# Patient Record
Sex: Male | Born: 1940 | Race: White | Hispanic: No | Smoking: Former smoker
Health system: Southern US, Community
[De-identification: ages and names within clinical notes are randomized; demographics above are authoritative.]

## PROBLEM LIST (undated history)

## (undated) DIAGNOSIS — E119 Type 2 diabetes mellitus without complications: Secondary | ICD-10-CM

## (undated) DIAGNOSIS — I10 Essential (primary) hypertension: Secondary | ICD-10-CM

## (undated) DIAGNOSIS — I2510 Atherosclerotic heart disease of native coronary artery without angina pectoris: Secondary | ICD-10-CM

## (undated) DIAGNOSIS — E7800 Pure hypercholesterolemia, unspecified: Secondary | ICD-10-CM

## (undated) DIAGNOSIS — R001 Bradycardia, unspecified: Secondary | ICD-10-CM

## (undated) DIAGNOSIS — C61 Malignant neoplasm of prostate: Secondary | ICD-10-CM

## (undated) DIAGNOSIS — C641 Malignant neoplasm of right kidney, except renal pelvis: Secondary | ICD-10-CM

## (undated) DIAGNOSIS — I499 Cardiac arrhythmia, unspecified: Secondary | ICD-10-CM

## (undated) DIAGNOSIS — I498 Other specified cardiac arrhythmias: Secondary | ICD-10-CM

## (undated) DIAGNOSIS — R0600 Dyspnea, unspecified: Secondary | ICD-10-CM

## (undated) DIAGNOSIS — R0989 Other specified symptoms and signs involving the circulatory and respiratory systems: Secondary | ICD-10-CM

## (undated) DIAGNOSIS — Z7189 Other specified counseling: Secondary | ICD-10-CM

## (undated) DIAGNOSIS — I739 Peripheral vascular disease, unspecified: Secondary | ICD-10-CM

## (undated) DIAGNOSIS — R809 Proteinuria, unspecified: Secondary | ICD-10-CM

## (undated) DIAGNOSIS — Z8546 Personal history of malignant neoplasm of prostate: Secondary | ICD-10-CM

## (undated) HISTORY — DX: Other specified symptoms and signs involving the circulatory and respiratory systems: R09.89

## (undated) HISTORY — DX: Bradycardia, unspecified: R00.1

## (undated) HISTORY — DX: Dyspnea, unspecified: R06.00

## (undated) HISTORY — DX: Proteinuria, unspecified: R80.9

## (undated) HISTORY — DX: Type 2 diabetes mellitus without complications: E11.9

## (undated) HISTORY — DX: Essential (primary) hypertension: I10

## (undated) HISTORY — DX: Peripheral vascular disease, unspecified: I73.9

## (undated) HISTORY — DX: Personal history of malignant neoplasm of prostate: Z85.46

## (undated) HISTORY — DX: Atherosclerotic heart disease of native coronary artery without angina pectoris: I25.10

## (undated) HISTORY — PX: CORONARY ANGIOPLASTY WITH STENT PLACEMENT: SHX49

## (undated) HISTORY — DX: Malignant neoplasm of prostate: C61

## (undated) HISTORY — DX: Malignant neoplasm of right kidney, except renal pelvis: C64.1

## (undated) HISTORY — DX: Other specified counseling: Z71.89

## (undated) HISTORY — DX: Cardiac arrhythmia, unspecified: I49.9

## (undated) HISTORY — DX: Other specified cardiac arrhythmias: I49.8

## (undated) HISTORY — DX: Pure hypercholesterolemia, unspecified: E78.00

---

## 1998-02-05 ENCOUNTER — Ambulatory Visit (HOSPITAL_BASED_OUTPATIENT_CLINIC_OR_DEPARTMENT_OTHER): Admission: RE | Admit: 1998-02-05 | Discharge: 1998-02-05 | Payer: Self-pay | Admitting: Orthopedic Surgery

## 1998-02-26 ENCOUNTER — Ambulatory Visit (HOSPITAL_BASED_OUTPATIENT_CLINIC_OR_DEPARTMENT_OTHER): Admission: RE | Admit: 1998-02-26 | Discharge: 1998-02-26 | Payer: Self-pay | Admitting: Orthopedic Surgery

## 2002-06-12 ENCOUNTER — Encounter (INDEPENDENT_AMBULATORY_CARE_PROVIDER_SITE_OTHER): Payer: Self-pay

## 2002-06-12 ENCOUNTER — Ambulatory Visit (HOSPITAL_COMMUNITY): Admission: RE | Admit: 2002-06-12 | Discharge: 2002-06-12 | Payer: Self-pay | Admitting: *Deleted

## 2003-04-06 ENCOUNTER — Ambulatory Visit (HOSPITAL_COMMUNITY): Admission: RE | Admit: 2003-04-06 | Discharge: 2003-04-06 | Payer: Self-pay | Admitting: Urology

## 2003-04-06 ENCOUNTER — Encounter: Payer: Self-pay | Admitting: Urology

## 2003-05-09 ENCOUNTER — Ambulatory Visit: Admission: RE | Admit: 2003-05-09 | Discharge: 2003-08-07 | Payer: Self-pay | Admitting: *Deleted

## 2003-10-01 ENCOUNTER — Ambulatory Visit: Admission: RE | Admit: 2003-10-01 | Discharge: 2003-12-30 | Payer: Self-pay | Admitting: Radiation Oncology

## 2003-11-02 ENCOUNTER — Encounter: Admission: RE | Admit: 2003-11-02 | Discharge: 2003-11-02 | Payer: Self-pay | Admitting: Urology

## 2003-11-09 ENCOUNTER — Ambulatory Visit (HOSPITAL_BASED_OUTPATIENT_CLINIC_OR_DEPARTMENT_OTHER): Admission: RE | Admit: 2003-11-09 | Discharge: 2003-11-09 | Payer: Self-pay | Admitting: Urology

## 2003-11-09 ENCOUNTER — Ambulatory Visit (HOSPITAL_COMMUNITY): Admission: RE | Admit: 2003-11-09 | Discharge: 2003-11-09 | Payer: Self-pay | Admitting: Urology

## 2004-02-27 ENCOUNTER — Inpatient Hospital Stay (HOSPITAL_COMMUNITY): Admission: EM | Admit: 2004-02-27 | Discharge: 2004-02-29 | Payer: Self-pay

## 2004-04-05 ENCOUNTER — Emergency Department (HOSPITAL_COMMUNITY): Admission: EM | Admit: 2004-04-05 | Discharge: 2004-04-06 | Payer: Self-pay | Admitting: Emergency Medicine

## 2004-08-22 ENCOUNTER — Ambulatory Visit: Payer: Self-pay | Admitting: Cardiology

## 2004-11-24 ENCOUNTER — Ambulatory Visit: Payer: Self-pay | Admitting: Cardiology

## 2005-02-23 ENCOUNTER — Ambulatory Visit: Payer: Self-pay | Admitting: Cardiology

## 2005-05-15 ENCOUNTER — Ambulatory Visit: Payer: Self-pay | Admitting: Internal Medicine

## 2005-06-05 ENCOUNTER — Ambulatory Visit: Payer: Self-pay | Admitting: Cardiovascular Disease

## 2005-06-05 ENCOUNTER — Ambulatory Visit: Payer: Self-pay

## 2005-06-29 ENCOUNTER — Ambulatory Visit: Payer: Self-pay

## 2005-07-10 ENCOUNTER — Ambulatory Visit: Payer: Self-pay | Admitting: Cardiovascular Disease

## 2005-07-13 ENCOUNTER — Ambulatory Visit: Payer: Self-pay | Admitting: Cardiovascular Disease

## 2005-07-13 ENCOUNTER — Inpatient Hospital Stay (HOSPITAL_BASED_OUTPATIENT_CLINIC_OR_DEPARTMENT_OTHER): Admission: RE | Admit: 2005-07-13 | Discharge: 2005-07-13 | Payer: Self-pay | Admitting: Cardiovascular Disease

## 2005-07-16 ENCOUNTER — Encounter (HOSPITAL_COMMUNITY): Admission: RE | Admit: 2005-07-16 | Discharge: 2005-10-14 | Payer: Self-pay | Admitting: Cardiovascular Disease

## 2005-08-04 ENCOUNTER — Ambulatory Visit: Payer: Self-pay | Admitting: Internal Medicine

## 2005-08-31 ENCOUNTER — Ambulatory Visit: Payer: Self-pay | Admitting: Cardiovascular Disease

## 2005-10-15 ENCOUNTER — Encounter (HOSPITAL_COMMUNITY): Admission: RE | Admit: 2005-10-15 | Discharge: 2005-12-17 | Payer: Self-pay | Admitting: Cardiovascular Disease

## 2005-10-23 ENCOUNTER — Encounter: Payer: Self-pay | Admitting: Family Medicine

## 2005-10-23 LAB — CONVERTED CEMR LAB: Hgb A1c MFr Bld: 5.8 %

## 2006-04-19 ENCOUNTER — Ambulatory Visit: Payer: Self-pay | Admitting: Cardiovascular Disease

## 2006-04-19 ENCOUNTER — Inpatient Hospital Stay (HOSPITAL_COMMUNITY): Admission: EM | Admit: 2006-04-19 | Discharge: 2006-04-21 | Payer: Self-pay | Admitting: Emergency Medicine

## 2006-04-19 ENCOUNTER — Encounter: Payer: Self-pay | Admitting: Family Medicine

## 2006-04-19 LAB — CONVERTED CEMR LAB: PSA: 0.2 ng/mL

## 2006-05-21 ENCOUNTER — Ambulatory Visit: Payer: Self-pay | Admitting: Cardiovascular Disease

## 2006-06-14 ENCOUNTER — Ambulatory Visit: Payer: Self-pay | Admitting: Cardiovascular Disease

## 2006-12-03 ENCOUNTER — Ambulatory Visit: Payer: Self-pay | Admitting: Cardiovascular Disease

## 2006-12-24 ENCOUNTER — Ambulatory Visit: Payer: Self-pay

## 2007-01-03 ENCOUNTER — Ambulatory Visit: Payer: Self-pay

## 2007-01-03 ENCOUNTER — Encounter: Payer: Self-pay | Admitting: Cardiology

## 2007-04-15 ENCOUNTER — Emergency Department (HOSPITAL_COMMUNITY): Admission: EM | Admit: 2007-04-15 | Discharge: 2007-04-15 | Payer: Self-pay | Admitting: Emergency Medicine

## 2007-04-15 ENCOUNTER — Ambulatory Visit: Payer: Self-pay | Admitting: Cardiology

## 2007-04-19 ENCOUNTER — Ambulatory Visit: Payer: Self-pay | Admitting: Cardiovascular Disease

## 2007-05-23 ENCOUNTER — Encounter: Payer: Self-pay | Admitting: Family Medicine

## 2007-05-23 LAB — CONVERTED CEMR LAB: Microalb, Ur: 70.4 mg/dL

## 2007-07-15 ENCOUNTER — Ambulatory Visit: Payer: Self-pay | Admitting: Cardiovascular Disease

## 2007-11-28 ENCOUNTER — Ambulatory Visit: Payer: Self-pay | Admitting: Family Medicine

## 2007-11-28 DIAGNOSIS — E119 Type 2 diabetes mellitus without complications: Secondary | ICD-10-CM | POA: Insufficient documentation

## 2007-11-28 DIAGNOSIS — I2510 Atherosclerotic heart disease of native coronary artery without angina pectoris: Secondary | ICD-10-CM

## 2007-11-28 DIAGNOSIS — R001 Bradycardia, unspecified: Secondary | ICD-10-CM | POA: Insufficient documentation

## 2007-12-02 ENCOUNTER — Encounter: Payer: Self-pay | Admitting: Family Medicine

## 2007-12-02 LAB — CONVERTED CEMR LAB
Alkaline Phosphatase: 72 units/L (ref 39–117)
CO2: 21 meq/L (ref 19–32)
Cholesterol: 120 mg/dL (ref 0–200)
Creatinine, Ser: 1.06 mg/dL (ref 0.40–1.50)
Glucose, Bld: 127 mg/dL — ABNORMAL HIGH (ref 70–99)
HCT: 46.4 % (ref 39.0–52.0)
HDL: 37 mg/dL — ABNORMAL LOW (ref >39)
MCHC: 30.6 g/dL (ref 30.0–36.0)
MCV: 93.2 fL (ref 78.0–100.0)
Platelets: 172 10*3/uL (ref 150–400)
RBC: 4.98 M/uL (ref 4.22–5.81)
Total Bilirubin: 0.5 mg/dL (ref 0.3–1.2)
Total CHOL/HDL Ratio: 3.2
Triglycerides: 133 mg/dL (ref <150)
VLDL: 27 mg/dL (ref 0–40)
WBC: 8.7 10*3/uL (ref 4.0–10.5)

## 2007-12-05 ENCOUNTER — Ambulatory Visit: Payer: Self-pay | Admitting: Family Medicine

## 2008-01-04 ENCOUNTER — Encounter: Payer: Self-pay | Admitting: Family Medicine

## 2008-01-06 ENCOUNTER — Ambulatory Visit: Payer: Self-pay | Admitting: Cardiovascular Disease

## 2008-01-06 ENCOUNTER — Ambulatory Visit: Payer: Self-pay

## 2008-01-19 ENCOUNTER — Encounter: Payer: Self-pay | Admitting: Family Medicine

## 2008-01-19 DIAGNOSIS — Z8546 Personal history of malignant neoplasm of prostate: Secondary | ICD-10-CM

## 2008-01-20 ENCOUNTER — Encounter: Payer: Self-pay | Admitting: Family Medicine

## 2008-02-06 ENCOUNTER — Ambulatory Visit: Payer: Self-pay | Admitting: Family Medicine

## 2008-06-18 ENCOUNTER — Telehealth: Payer: Self-pay | Admitting: Family Medicine

## 2008-06-28 ENCOUNTER — Ambulatory Visit: Payer: Self-pay | Admitting: Family Medicine

## 2008-06-28 DIAGNOSIS — J309 Allergic rhinitis, unspecified: Secondary | ICD-10-CM | POA: Insufficient documentation

## 2008-06-28 LAB — CONVERTED CEMR LAB: Hgb A1c MFr Bld: 6.2 %

## 2008-07-03 LAB — CONVERTED CEMR LAB: PSA: 0.12 ng/mL (ref 0.10–4.00)

## 2008-07-06 ENCOUNTER — Ambulatory Visit: Payer: Self-pay | Admitting: Cardiovascular Disease

## 2008-09-03 ENCOUNTER — Ambulatory Visit: Payer: Self-pay | Admitting: Family Medicine

## 2008-10-01 ENCOUNTER — Ambulatory Visit: Payer: Self-pay | Admitting: Family Medicine

## 2008-12-07 ENCOUNTER — Ambulatory Visit: Payer: Self-pay | Admitting: Family Medicine

## 2008-12-07 LAB — CONVERTED CEMR LAB

## 2008-12-31 ENCOUNTER — Telehealth: Payer: Self-pay | Admitting: Family Medicine

## 2008-12-31 ENCOUNTER — Encounter: Payer: Self-pay | Admitting: Family Medicine

## 2008-12-31 LAB — CONVERTED CEMR LAB
AST: 25 units/L (ref 0–37)
Albumin: 4.3 g/dL (ref 3.5–5.2)
BUN: 23 mg/dL (ref 6–23)
CO2: 21 meq/L (ref 19–32)
Calcium: 9.2 mg/dL (ref 8.4–10.5)
Chloride: 102 meq/L (ref 96–112)
Glucose, Bld: 140 mg/dL — ABNORMAL HIGH (ref 70–99)
HDL: 33 mg/dL — ABNORMAL LOW (ref >39)
Potassium: 4.7 meq/L (ref 3.5–5.3)

## 2009-01-02 LAB — CONVERTED CEMR LAB: IgE (Immunoglobulin E), Serum: 165.1 intl units/mL (ref 0.0–180.0)

## 2009-01-07 ENCOUNTER — Encounter: Payer: Self-pay | Admitting: Cardiovascular Disease

## 2009-01-07 ENCOUNTER — Ambulatory Visit: Payer: Self-pay | Admitting: Cardiovascular Disease

## 2009-01-07 ENCOUNTER — Ambulatory Visit: Payer: Self-pay

## 2009-01-07 DIAGNOSIS — I739 Peripheral vascular disease, unspecified: Secondary | ICD-10-CM

## 2009-01-07 DIAGNOSIS — J449 Chronic obstructive pulmonary disease, unspecified: Secondary | ICD-10-CM

## 2009-01-17 ENCOUNTER — Telehealth (INDEPENDENT_AMBULATORY_CARE_PROVIDER_SITE_OTHER): Payer: Self-pay | Admitting: *Deleted

## 2009-01-18 DIAGNOSIS — I10 Essential (primary) hypertension: Secondary | ICD-10-CM | POA: Insufficient documentation

## 2009-01-18 DIAGNOSIS — L821 Other seborrheic keratosis: Secondary | ICD-10-CM

## 2009-01-18 DIAGNOSIS — R0989 Other specified symptoms and signs involving the circulatory and respiratory systems: Secondary | ICD-10-CM

## 2009-01-18 DIAGNOSIS — E7800 Pure hypercholesterolemia, unspecified: Secondary | ICD-10-CM | POA: Insufficient documentation

## 2009-01-21 ENCOUNTER — Ambulatory Visit: Payer: Self-pay

## 2009-01-21 ENCOUNTER — Encounter: Payer: Self-pay | Admitting: Cardiovascular Disease

## 2009-01-23 ENCOUNTER — Encounter: Payer: Self-pay | Admitting: Cardiovascular Disease

## 2009-02-11 ENCOUNTER — Telehealth: Payer: Self-pay | Admitting: Cardiovascular Disease

## 2009-02-14 ENCOUNTER — Telehealth: Payer: Self-pay | Admitting: Cardiovascular Disease

## 2009-02-21 ENCOUNTER — Telehealth: Payer: Self-pay | Admitting: Cardiovascular Disease

## 2009-03-04 ENCOUNTER — Ambulatory Visit: Payer: Self-pay | Admitting: Family Medicine

## 2009-03-04 DIAGNOSIS — Q181 Preauricular sinus and cyst: Secondary | ICD-10-CM

## 2009-03-04 DIAGNOSIS — M75101 Unspecified rotator cuff tear or rupture of right shoulder, not specified as traumatic: Secondary | ICD-10-CM | POA: Insufficient documentation

## 2009-03-08 ENCOUNTER — Telehealth: Payer: Self-pay | Admitting: Family Medicine

## 2009-03-15 ENCOUNTER — Encounter: Payer: Self-pay | Admitting: Family Medicine

## 2009-04-08 ENCOUNTER — Ambulatory Visit: Payer: Self-pay | Admitting: Cardiovascular Disease

## 2009-04-08 DIAGNOSIS — I70219 Atherosclerosis of native arteries of extremities with intermittent claudication, unspecified extremity: Secondary | ICD-10-CM

## 2009-05-30 ENCOUNTER — Encounter (INDEPENDENT_AMBULATORY_CARE_PROVIDER_SITE_OTHER): Payer: Self-pay | Admitting: *Deleted

## 2009-07-05 ENCOUNTER — Telehealth: Payer: Self-pay | Admitting: Cardiovascular Disease

## 2009-07-08 ENCOUNTER — Ambulatory Visit: Payer: Self-pay | Admitting: Family Medicine

## 2009-07-18 ENCOUNTER — Encounter: Payer: Self-pay | Admitting: Cardiovascular Disease

## 2009-07-19 ENCOUNTER — Ambulatory Visit: Payer: Self-pay

## 2009-07-19 ENCOUNTER — Ambulatory Visit: Payer: Self-pay | Admitting: Cardiovascular Disease

## 2009-07-24 ENCOUNTER — Encounter: Payer: Self-pay | Admitting: Cardiovascular Disease

## 2009-08-07 ENCOUNTER — Ambulatory Visit: Payer: Self-pay | Admitting: Family Medicine

## 2009-08-26 ENCOUNTER — Ambulatory Visit: Payer: Self-pay | Admitting: Family Medicine

## 2009-08-26 DIAGNOSIS — K3189 Other diseases of stomach and duodenum: Secondary | ICD-10-CM

## 2009-08-26 DIAGNOSIS — R1013 Epigastric pain: Secondary | ICD-10-CM

## 2009-09-06 ENCOUNTER — Ambulatory Visit: Payer: Self-pay | Admitting: Family Medicine

## 2009-09-06 DIAGNOSIS — IMO0002 Reserved for concepts with insufficient information to code with codable children: Secondary | ICD-10-CM

## 2009-09-18 ENCOUNTER — Encounter: Payer: Self-pay | Admitting: Family Medicine

## 2009-10-07 ENCOUNTER — Ambulatory Visit: Payer: Self-pay | Admitting: Cardiovascular Disease

## 2009-10-25 ENCOUNTER — Ambulatory Visit: Payer: Self-pay | Admitting: Family Medicine

## 2009-10-25 ENCOUNTER — Telehealth: Payer: Self-pay | Admitting: Family Medicine

## 2009-10-25 DIAGNOSIS — K644 Residual hemorrhoidal skin tags: Secondary | ICD-10-CM | POA: Insufficient documentation

## 2009-10-25 DIAGNOSIS — D239 Other benign neoplasm of skin, unspecified: Secondary | ICD-10-CM | POA: Insufficient documentation

## 2009-10-28 ENCOUNTER — Encounter: Payer: Self-pay | Admitting: Family Medicine

## 2009-10-29 LAB — CONVERTED CEMR LAB
AST: 17 units/L (ref 0–37)
BUN: 26 mg/dL — ABNORMAL HIGH (ref 6–23)
CO2: 25 meq/L (ref 19–32)
Calcium: 9.9 mg/dL (ref 8.4–10.5)
Chloride: 102 meq/L (ref 96–112)
Cholesterol: 101 mg/dL (ref 0–200)
Creatinine, Ser: 0.95 mg/dL (ref 0.40–1.50)
HCT: 41 % (ref 39.0–52.0)
HDL: 35 mg/dL — ABNORMAL LOW (ref >39)
Hemoglobin: 12.8 g/dL — ABNORMAL LOW (ref 13.0–17.0)
RDW: 13.8 % (ref 11.5–15.5)
Total CHOL/HDL Ratio: 2.9
Triglycerides: 70 mg/dL (ref <150)

## 2009-11-05 ENCOUNTER — Encounter: Payer: Self-pay | Admitting: Family Medicine

## 2009-11-06 ENCOUNTER — Telehealth: Payer: Self-pay | Admitting: Cardiovascular Disease

## 2009-11-11 ENCOUNTER — Ambulatory Visit (HOSPITAL_COMMUNITY): Admission: RE | Admit: 2009-11-11 | Discharge: 2009-11-11 | Payer: Self-pay | Admitting: Family Medicine

## 2009-11-13 ENCOUNTER — Encounter: Payer: Self-pay | Admitting: Family Medicine

## 2009-11-14 ENCOUNTER — Ambulatory Visit: Payer: Self-pay | Admitting: Family Medicine

## 2009-11-18 ENCOUNTER — Telehealth: Payer: Self-pay | Admitting: Family Medicine

## 2009-11-19 ENCOUNTER — Ambulatory Visit: Payer: Self-pay | Admitting: Oncology

## 2009-11-21 ENCOUNTER — Encounter: Payer: Self-pay | Admitting: Family Medicine

## 2009-11-25 ENCOUNTER — Encounter: Payer: Self-pay | Admitting: Family Medicine

## 2009-11-25 LAB — CBC WITH DIFFERENTIAL/PLATELET
Basophils Absolute: 0 10*3/uL (ref 0.0–0.1)
HCT: 38.9 % (ref 38.4–49.9)
HGB: 13 g/dL (ref 13.0–17.1)
MCH: 29.7 pg (ref 27.2–33.4)
MONO#: 0.5 10*3/uL (ref 0.1–0.9)
NEUT%: 64.9 % (ref 39.0–75.0)
WBC: 6.8 10*3/uL (ref 4.0–10.3)
lymph#: 1.8 10*3/uL (ref 0.9–3.3)

## 2009-11-25 LAB — COMPREHENSIVE METABOLIC PANEL
BUN: 25 mg/dL — ABNORMAL HIGH (ref 6–23)
CO2: 30 mEq/L (ref 19–32)
Calcium: 9.9 mg/dL (ref 8.4–10.5)
Chloride: 104 mEq/L (ref 96–112)
Creatinine, Ser: 1.18 mg/dL (ref 0.40–1.50)

## 2009-11-25 LAB — LACTATE DEHYDROGENASE: LDH: 93 U/L — ABNORMAL LOW (ref 94–250)

## 2009-12-04 ENCOUNTER — Encounter (HOSPITAL_COMMUNITY): Admission: RE | Admit: 2009-12-04 | Discharge: 2010-03-04 | Payer: Self-pay | Admitting: Urology

## 2009-12-14 ENCOUNTER — Encounter: Payer: Self-pay | Admitting: Family Medicine

## 2009-12-23 ENCOUNTER — Telehealth: Payer: Self-pay | Admitting: Cardiovascular Disease

## 2009-12-25 ENCOUNTER — Ambulatory Visit: Payer: Self-pay | Admitting: Oncology

## 2009-12-27 ENCOUNTER — Encounter: Payer: Self-pay | Admitting: Family Medicine

## 2010-01-28 ENCOUNTER — Ambulatory Visit: Payer: Self-pay | Admitting: Family Medicine

## 2010-01-28 DIAGNOSIS — J209 Acute bronchitis, unspecified: Secondary | ICD-10-CM

## 2010-04-18 ENCOUNTER — Encounter: Payer: Self-pay | Admitting: Cardiovascular Disease

## 2010-04-21 ENCOUNTER — Ambulatory Visit: Payer: Self-pay

## 2010-04-21 ENCOUNTER — Ambulatory Visit: Payer: Self-pay | Admitting: Cardiovascular Disease

## 2010-04-26 ENCOUNTER — Emergency Department (HOSPITAL_COMMUNITY): Admission: EM | Admit: 2010-04-26 | Discharge: 2010-04-26 | Payer: Self-pay | Admitting: Emergency Medicine

## 2010-05-23 ENCOUNTER — Ambulatory Visit: Payer: Self-pay | Admitting: Family Medicine

## 2010-08-25 ENCOUNTER — Ambulatory Visit: Payer: Self-pay | Admitting: Family Medicine

## 2010-08-25 DIAGNOSIS — S8010XA Contusion of unspecified lower leg, initial encounter: Secondary | ICD-10-CM | POA: Insufficient documentation

## 2010-08-25 DIAGNOSIS — L723 Sebaceous cyst: Secondary | ICD-10-CM | POA: Insufficient documentation

## 2010-09-01 ENCOUNTER — Telehealth: Payer: Self-pay | Admitting: Family Medicine

## 2010-10-23 ENCOUNTER — Encounter: Payer: Self-pay | Admitting: Cardiovascular Disease

## 2010-10-23 NOTE — Progress Notes (Signed)
Summary: pt needs letter of clearence  Phone Note From Other Clinic Call back at [phone]   Caller: Velna Hatchet with Dr. Yevonne Pax Summary of Call: pt having colonoscopy on 2/23 and needs to stop plavix for 4days please fax letter of clearence to [phone] Initial call taken by: Lorne Skeens,  November 06, 2009 2:44 PM  Follow-up for Phone Call        Spoke with Velna Hatchet at Dr. Yevonne Pax' office regarding pt. needing to be off Plavix  for 4 days starting on Saturday 02/19 /11. Pt. is scheduled  for a Colonoscopy on 11/13/09.  I let Velna Hatchet know will send message to MD and his nurse for recommendations. Ollen Gross, RN, BSN  November 06, 2009 4:01 PM   Additional Follow-up for Phone Call Additional follow up Details #1::        ok to stop Plavix and have colonoscopy Additional Follow-up by: Colon Branch, MD, [facility],  November 07, 2009 8:41 AM

## 2010-10-23 NOTE — Letter (Signed)
[facility]   Imported By: Lanelle Bal 01/14/2010 14:24:48  _____________________________________________________________________  External Attachment:    Type:   Image     Comment:   External Document

## 2010-10-23 NOTE — Consult Note (Signed)
Summary: [facility]  [facility]   Imported By: Lanelle Bal 12/26/2009 11:27:48  _____________________________________________________________________  External Attachment:    Type:   Image     Comment:   External Document

## 2010-10-23 NOTE — Letter (Signed)
[facility]   Imported By: Lanelle Bal 12/11/2009 08:50:00  _____________________________________________________________________  External Attachment:    Type:   Image     Comment:   External Document

## 2010-10-23 NOTE — Progress Notes (Signed)
Summary: Call a Nurse  Phone Note Call from Patient   Caller: Call a Nurse Summary of Call: [facility] Triage Call Report Triage Record Num: [id number] Operator: Bethanie Dicker Patient Name: [name] [name] Call Date & Time: 11/17/2009 6:06:07PM Patient Phone: [phone] PCP: Seymour Bars, DO Patient Gender: Male PCP Fax : [phone] Patient DOB: 12-Oct-1940 Practice Name: [facility] Reason for Call: Calling stating that he would like to get a message to Dr. Cathey Endow. States that he is supposed to be scheduled for a "bone scan" sometime this week and he is going to be out of town on Tuesday and Wednesday but he can do any other time this week. Advsied that I would take this message and fax to office but to also f/u tomorrow with this mesaage during daytime hours. Protocol(s) Used: Office Note Recommended Outcome per Protocol: Information Noted and Sent to Office Reason for Outcome: Caller information to office Care Advice:  ~ 02/  Follow-up for Phone Call        Pls call pt back to ask about this.  He already had a bone scan done and is supposed to have f/u appts with urology and oncology.   Follow-up by: Seymour Bars DO,  November 18, 2009 8:24 AM  Additional Follow-up for Phone Call Additional follow up Details #1::        Pt was referring to any apts for this week.  Additional Follow-up by: Payton Spark CMA,  November 18, 2009 9:10 AM    Additional Follow-up for Phone Call Additional follow up Details #2::    Pls check with [facility] and [facility] to see if appts have been made.  Myriam Jacobson may be able to check on this. Follow-up by: Seymour Bars DO,  November 18, 2009 9:22 AM   Appended Document: Call a Nurse Pt scheduled at [facility] 11/21/09 @ 8:30am. Pt aware.  Called Cancer Center- MD to review notes and they will call Pt to schedule

## 2010-10-23 NOTE — Assessment & Plan Note (Signed)
Summary: dyspepsia   Vital Signs:  Patient profile:   70 year old male Height:      70 inches Weight:      192 pounds BMI:     27.65 O2 Sat:      97 % on Room air Pulse rate:   70 / minute BP sitting:   119 / 60  (left arm) Cuff size:   regular  Vitals Entered By: Payton Spark CMA (May 23, 2010 10:11 AM)  O2 Flow:  Room air CC: Acid reflux. Would like Rx for acid reduction.    Primary Care Provider:  Seymour Bars DO  CC:  Acid reflux. Would like Rx for acid reduction. Marland Kitchen  History of Present Illness: 70 yo WM presents for acid reflux.  He went to the ED for indigestion and he ruled out.  He then went thru cardiac testing with Dr Eden Emms and eveyrthing came back normal  He was told to cut back on caffine.  Prevacid and Prilosec did not help.  Famotidine did help.  He avoids spicy, greasy foods.  Denies ETOH use.   Denies wt loss, epigastric pain, melena, hematochezia, Nausea or vomitting.      Current Medications (verified): 1)  Plavix 75 Mg  Tabs (Clopidogrel Bisulfate) .... Take 1 Tablet By Mouth Once A Day 2)  Metoprolol Succinate 100 Mg  Tb24 (Metoprolol Succinate) .... Take 1/2  Tablet By Mouth Once A Day 3)  Glipizide-Metformin Hcl 2.5-500 Mg  Tabs (Glipizide-Metformin Hcl) .... 2 Tabs By Mouth Bid 4)  Cvs Aspirin 325 Mg  Tabs (Aspirin) .... Take 1 Tablet By Mouth Once A Day 5)  Cardura 2 Mg  Tabs (Doxazosin Mesylate) .Marland Kitchen.. 1 Tab By Mouth Q Hs 6)  Lisinopril 40 Mg Tabs (Lisinopril) .... Take 1 Tablet By Mouth Once A Day 7)  Zocor 80 Mg Tabs (Simvastatin) .... Take 1/2  Tablet By Mouth At Bedtime 8)  Hydrochlorothiazide 25 Mg Tabs (Hydrochlorothiazide) .... Take One Tablet By Mouth Daily.  Allergies (verified): 1)  ! 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 cardiac stenting  Social History: Reviewed history from 09/06/2009 and no changes required. 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  Has 4 kids, 2 are local. Quit smoking 8-08 after 20 pack years. 1 ETOH/ wk.  Fair diet. No regular exercise.  Review of Systems      See HPI  Physical Exam  General:  alert, well-developed, well-nourished, well-hydrated, and overweight-appearing.   Head:  normocephalic and atraumatic.   Eyes:  sclera non icteric.  R sclera pterygion Mouth:  pharynx pink and moist.   Neck:  no masses.   Lungs:  Normal respiratory effort, chest expands symmetrically. Lungs are clear to auscultation, no crackles or wheezes.  + bibasilar rhonchi with cough Heart:  Normal rate and regular rhythm. S1 and S2 normal without gallop, murmur, click, rub or other extra sounds. Abdomen:  soft, non-tender, normal bowel sounds, no distention, no masses, no guarding, no hepatomegaly, and no splenomegaly.  no epigastric guarding Extremities:  no LE edema Skin:  hemosiderin deposits over both anterior tibias Psych:  good  eye contact, not anxious appearing, and not depressed appearing.     Impression & Recommendations:  Problem # 1:  DYSPEPSIA (ICD-536.8) REviewed Dr Fabio Bering notes proving that his dyspepsia was not cardiac in etiology. He has cut back on intake of caffiene but has had the most relief from H2Blockers and not PPIs. I wrote him for Famotidine 40 mg two times a day today.  he is to adhere to reflux precautions and will call if he develops abdomianl pain or blood in the stool.  He is not on any NSAIDs and avoids  ETOH.  Problem # 2:  CAD (ICD-414.00) Recently had a full w/u with Dr Eden Emms and was normal.  he has a 6 mos f/u visit. His updated medication list for this problem includes:    Plavix 75 Mg Tabs (Clopidogrel bisulfate) .Marland Kitchen... Take 1 tablet by mouth once a day    Metoprolol Succinate 100 Mg Tb24 (Metoprolol succinate) .Marland Kitchen... Take 1/2  tablet by mouth once a day    Cvs Aspirin 325 Mg Tabs (Aspirin) .Marland Kitchen... Take 1 tablet by mouth once a day    Cardura 2 Mg Tabs (Doxazosin mesylate) .Marland Kitchen... 1 tab by mouth q hs    Lisinopril 40 Mg Tabs (Lisinopril) .Marland Kitchen... Take 1 tablet by mouth once a day    Hydrochlorothiazide 25 Mg Tabs (Hydrochlorothiazide) .Marland Kitchen... Take one tablet by mouth daily.  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  Complete Medication List: 1)  Plavix 75 Mg Tabs (Clopidogrel bisulfate) .... Take 1 tablet by mouth once a day 2)  Metoprolol Succinate 100 Mg Tb24 (Metoprolol succinate) .... Take 1/2  tablet by mouth once a day 3)  Glipizide-metformin Hcl 2.5-500 Mg Tabs (Glipizide-metformin hcl) .... 2 tabs by mouth bid 4)  Cvs Aspirin 325 Mg Tabs (Aspirin) .... Take 1 tablet by mouth once a day 5)  Cardura 2 Mg Tabs (Doxazosin mesylate) .Marland Kitchen.. 1 tab by mouth q hs 6)  Lisinopril 40 Mg Tabs (Lisinopril) .... Take 1 tablet by mouth once a day 7)  Zocor 80 Mg Tabs (Simvastatin) .... Take 1/2  tablet by mouth at bedtime 8)  Hydrochlorothiazide 25 Mg Tabs (Hydrochlorothiazide) .... Take one tablet by mouth daily. 9)  Famotidine 40 Mg Tabs (Famotidine) .Marland Kitchen.. 1 tab by mouth bid  Patient Instructions: 1)  Take Famotodine 2 x a day for acid reflux. 2)  Call if any abdominal pain, blood in the stool. 3)  Return for follow up diabetes/ flu shot in 4 wks. Prescriptions: FAMOTIDINE 40 MG TABS (FAMOTIDINE) 1 tab by mouth bid  #180 x 1   Entered and Authorized by:   Seymour Bars DO   Signed by:   Seymour Bars DO on 05/23/2010   Method used:    Electronically to        [facility] [phone]* (retail)       [address] [place], [place]  [address]       Ph: [phone]       Fax: [phone]   RxID:   [id number]

## 2010-10-23 NOTE — Progress Notes (Signed)
Summary: refill  Phone Note Refill Request Message from:  Patient on December 23, 2009 1:35 PM  Refills Requested: Medication #1:  PLAVIX 75 MG  TABS Take 1 tablet by mouth once a day  Medication #2:  METOPROLOL SUCCINATE 100 MG  TB24 Take 1 tablet by mouth once a day  Medication #3:  ZOCOR 80 MG TABS Take 1/2  tablet by mouth at bedtime Pt need 90pills for each prescriptions send to [facility] [address] [phone]  Initial call taken by: Judie Grieve,  December 23, 2009 1:38 PM    Prescriptions: ZOCOR 80 MG TABS (SIMVASTATIN) Take 1/2  tablet by mouth at bedtime  #90 x 3   Entered by:   Kem Parkinson   Authorized by:   Colon Branch, MD, [facility]   Signed by:   Kem Parkinson on 12/23/2009   Method used:   Electronically to        [facility] [phone]* (retail)       [address] [place], [place]  [address]       Ph: [phone]       Fax: [phone]   RxID:   [id number] METOPROLOL SUCCINATE 100 MG  TB24 (METOPROLOL SUCCINATE) Take 1 tablet by mouth once a day  #90 x 3   Entered by:   Kem Parkinson   Authorized by:   Colon Branch, MD, [facility]   Signed by:   Kem Parkinson on 12/23/2009   Method used:   Electronically to        [facility] [phone]* (retail)       [address] [place], [place]  [address]       Ph: [phone]       Fax: [phone]   RxID:   [id number] PLAVIX 75 MG  TABS (CLOPIDOGREL BISULFATE) Take 1 tablet by mouth once a day  #90 x 3   Entered by:   Kem Parkinson   Authorized by:   Colon Branch, MD, [facility]   Signed by:   Kem Parkinson on 12/23/2009   Method used:   Electronically to        [facility] [phone]* (retail)       [address] [place], [place]  [address]       Ph: [phone]       Fax: [phone]   RxID:   [id number]

## 2010-10-23 NOTE — Miscellaneous (Signed)
Summary: Orders Update  Clinical Lists Changes  Orders: Added new Test order of Arterial Duplex Lower Extremity (Arterial Duplex Low) - Signed 

## 2010-10-23 NOTE — Consult Note (Signed)
Summary: [facility]  [facility]   Imported By: Lanelle Bal 12/02/2009 13:29:13  _____________________________________________________________________  External Attachment:    Type:   Image     Comment:   External Document

## 2010-10-23 NOTE — Assessment & Plan Note (Signed)
Summary: f/u bone scan   Vital Signs:  Patient profile:   70 year old male Height:      70 inches Weight:      193 pounds BMI:     27.79 O2 Sat:      99 % on Room air Pulse rate:   74 / minute BP sitting:   116 / 61  (left arm) Cuff size:   regular  Vitals Entered By: Payton Spark CMA (November 14, 2009 9:13 AM)  O2 Flow:  Room air CC: F/U bone scan   Primary Care Provider:  Seymour Bars DO  CC:  F/U bone scan.  History of Present Illness: 70 yo WM presents for follow up visit.  He had a bone scan done last wk after having an elevated alkaline phosphatase on screening labs.  He has a hx of prostate cancer s/p seed implants w/ Dr Patsi Sears > 5 yrs ago (per pt hx).  He has been feeling well other than some chest wall pain after a fall in the ice last month.  His pain has resolved.  He had a colonoscopy done this wk which was neg for cancer.  He had rectal tenderness though.  He cannot remember the last time he saw Dr Patsi Sears.  He has no cough or hemoptysis, night sweats, bone pain or wt loss.    Current Medications (verified): 1)  Plavix 75 Mg  Tabs (Clopidogrel Bisulfate) .... Take 1 Tablet By Mouth Once A Day 2)  Metoprolol Succinate 100 Mg  Tb24 (Metoprolol Succinate) .... Take 1 Tablet By Mouth Once A Day 3)  Glipizide-Metformin Hcl 2.5-500 Mg  Tabs (Glipizide-Metformin Hcl) .... 2 Tabs By Mouth Bid 4)  Cvs Aspirin 325 Mg  Tabs (Aspirin) .... Take 1 Tablet By Mouth Once A Day 5)  Cardura 2 Mg  Tabs (Doxazosin Mesylate) .Marland Kitchen.. 1 Tab By Mouth Q Hs 6)  Lisinopril 40 Mg Tabs (Lisinopril) .... Take 1 Tablet By Mouth Once A Day 7)  Zocor 80 Mg Tabs (Simvastatin) .... Take 1/2  Tablet By Mouth At Bedtime 8)  Hydrochlorothiazide 25 Mg Tabs (Hydrochlorothiazide) .... Take One Tablet By Mouth Daily. 9)  Percocet 5-325 Mg Tabs (Oxycodone-Acetaminophen) .Marland Kitchen.. 1 Tab By Mouth Q 8 Hrs As Needed Severe Pain  Allergies (verified): 1)  ! * Sodium Pentothal  Past History:  Past Medical  History: Reviewed history from 09/06/2009 and no changes required. 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 father died AMI at 82 mother died old age at 39 brother CAD sister healthy  Social History: Reviewed history from 09/06/2009 and no changes required. 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  Has 4 kids, 2 are local. Quit smoking 8-08 after 20 pack years. 1 ETOH/ wk.  Fair diet. No regular exercise.  Review of Systems      See HPI  Physical Exam  General:  alert, well-developed, well-nourished, and well-hydrated.   Skin:  color normal.   Psych:  good eye contact, not anxious appearing, and not depressed appearing.     Impression & Recommendations:  Problem # 1:  ALKALINE PHOSPHATASE, ELEVATED (ICD-790.5) High Alk Phos with Bone Scan highly suspicious for metastatic cancer. Hx of prostate cancer--  send bone scan to Dr Patsi Sears and I will call his to discuss f/u. He also needs to be seen by Oncology at [facility] -- he wants to see the same oncologist that his wife saw. Emotionally, he seems to be doing well and has his son for support. He denies any thoracic spine or chest wall pain at this time. Reviewed his normal colonoscopy result. I do wonder if his rectal pain is acutally coming metastatic prostate cancer.    Complete Medication List: 1)  Plavix 75 Mg Tabs (Clopidogrel bisulfate) .... Take 1 tablet by mouth once a day 2)  Metoprolol Succinate 100 Mg Tb24 (Metoprolol succinate) .... Take  1 tablet by mouth once a day 3)  Glipizide-metformin Hcl 2.5-500 Mg Tabs (Glipizide-metformin hcl) .... 2 tabs by mouth bid 4)  Cvs Aspirin 325 Mg Tabs (Aspirin) .... Take 1 tablet by mouth once a day 5)  Cardura 2 Mg Tabs (Doxazosin mesylate) .Marland Kitchen.. 1 tab by mouth q hs 6)  Lisinopril 40 Mg Tabs (Lisinopril) .... Take 1 tablet by mouth once a day 7)  Zocor 80 Mg Tabs (Simvastatin) .... Take 1/2  tablet by mouth at bedtime 8)  Hydrochlorothiazide 25 Mg Tabs (Hydrochlorothiazide) .... Take one tablet by mouth daily. 9)  Percocet 5-325 Mg Tabs (Oxycodone-acetaminophen) .Marland Kitchen.. 1 tab by mouth q 8 hrs as needed severe pain  Appended Document: f/u bone scan I spoke to the scheduler at Dr Imelda Pillow office and they are going to get [place] in during March to see his NP.  # given to scheduler.  Seymour Bars, D.O.

## 2010-10-23 NOTE — Assessment & Plan Note (Signed)
Summary: WELCOME TO MEDICARE CPE   Vital Signs:  Patient profile:   70 year old male Height:      70 inches Weight:      193.50 pounds BMI:     27.86 Pulse rate:   55 / minute BP sitting:   127 / 67  Vitals Entered By: Kandice Hams (October 25, 2009 9:57 AM) CC: CPX, Hypertension Management  Vision Screening:Left eye w/o correction: 20 / 20 Right Eye w/o correction: 20 / 40 Both eyes w/o correction:  20/ 20         Primary Care Provider:  Seymour Bars DO  CC:  CPX and Hypertension Management.  History of Present Illness: 70 yo WM presents for Welcome to [facility] Physical.  Slid on the ice last month and hit his back and his L flank.  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  Denies trouble breathing.  Tylneol not helping.  On ASA + Plavix so cannot take NSAIDs.  PMH includes Hx of prostate cancer - followed by Dr Patsi Sears, due for f/u. T2DM- on Glipizide + metformin, GERD, PAD, CAD - followed by Dr Eden Emms, on Metoprolol, Plavix, ASA, Lisinopril.  Hx of High cholesterol - on zocor.  Hx of HTN - on Metoprolol, Lisinopril, Cardura (also for BPH), HCTZ.  Also has hx of bigemiy, carotid atherosclerosis.  Allergies include Sodium Pentothal.   Quit smoking in 08. Denies drug use.  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  His family has been very supportive.  He is not exercising.  Healthy diet.  Denies ETOH.  Fam hx : Brother died of AMI at 2.  Mother died at 25.  sister healthy. Brother has CAD.    Depression screen: 1. over the past 2 wks have you felt down, depressed or hopeless?  NO. 2.  Over the past 2 wks, have you felt little interest or pleasure in doing things?  No.  Functional Ability screen (3 questions-- all NEGATIVE.)/ Heraring eval : normal. Vision: L 20/20 R 20/25 together 20/20 uncorrected    Hypertension History:      Positive major cardiovascular risk factors  include male age 36 years old or older, diabetes, hyperlipidemia, hypertension, and family history for ischemic heart disease (males less than 31 years old).  Negative major cardiovascular risk factors include non-tobacco-user status.        Positive history for target organ damage include ASHD (either angina/prior MI/prior CABG) and peripheral vascular disease.     Allergies: 1)  ! * Sodium Pentothal  Physical Exam  General:  alert, well-developed, well-nourished, well-hydrated, and overweight-appearing.   Head:  normocephalic and atraumatic.   Eyes:  pupils equal, pupils round, and pupils reactive to light.   Ears:  EACs patent; TMs translucent and gray with good cone of light and bony landmarks.  Nose:  no nasal discharge.   Mouth:  good dentition and pharynx pink and moist.   Neck:  no masses.  faint L>R carotid bruit Lungs:  Normal respiratory effort, chest expands symmetrically. Lungs are clear to auscultation, no crackles or wheezes. Heart:  Normal rate and regular rhythm. S1 and S2 normal without gallop, murmur, click, rub or other extra sounds. Abdomen:  Bowel sounds positive,abdomen soft and non-tender without masses, organomegaly.  No AA bruits audible Rectal:  external hemorrhoids present, slightly tender hemoccult neg Prostate:  Prostate gland  firm and smooth,1+  enlargement, nodularity, tenderness, mass, asymmetry or induration. Msk:  gait normal Pulses:  2+ radial and pedal pulses Extremities:  no E/C/C Skin:  color normal.  1x1 cm dark irregular nevus R mid chest Cervical Nodes:  No lymphadenopathy noted Psych:  good eye contact, not anxious appearing, and not depressed appearing.     Impression & Recommendations:  Problem # 1:  HEALTH SCREENING (ICD-V70.0)  Welcome to Medicare Physical done.   BP at goal.  BMI overwt. Compliant with meds, diet.  Could use more exercise.  Quit smoking 3 yrs ago. Mood stable after recent loss of wife to lung cancer. Pneumovax  updated 09. Derm appt scheduled for atypical nevus on chest. Update fasting labs today. Pt requested gen surg referral for symptomatic hemorrhoids w/o complication. Colonscopy due -- will set up for 10 yr f/u. DRE + PSA today with hx of prostate cancer.  Sent results to Dr Patsi Sears and schedule his f/u appt. EKG/ cardiac w/u done by Dr Eden Emms.  RTC for DM in 3 mos.  Orders: Vision Screening ([address])  Complete Medication List: 1)  Plavix 75 Mg Tabs (Clopidogrel bisulfate) .... Take 1 tablet by mouth once a day 2)  Metoprolol Succinate 100 Mg Tb24 (Metoprolol succinate) .... Take 1 tablet by mouth once a day 3)  Glipizide-metformin Hcl 2.5-500 Mg Tabs (Glipizide-metformin hcl) .... 2 tabs by mouth bid 4)  Cvs Aspirin 325 Mg Tabs (Aspirin) .... Take 1 tablet by mouth once a day 5)  Cardura 2 Mg Tabs (Doxazosin mesylate) .Marland Kitchen.. 1 tab by mouth q hs 6)  Lisinopril 40 Mg Tabs (Lisinopril) .... Take 1 tablet by mouth once a day 7)  Zocor 80 Mg Tabs (Simvastatin) .... Take 1/2  tablet by mouth at bedtime 8)  Hydrochlorothiazide 25 Mg Tabs (Hydrochlorothiazide) .... Take one tablet by mouth daily.  Other Orders: T-CBC No Diff ([id number]) T-Comprehensive Metabolic Panel [phone]) T-Lipid Profile ([id number]) T-PSA Total (Medicare Screen Only) ([id number]) Dermatology Referral (Derma) TD Toxoids IM 7 YR + (24401) Admin 1st Vaccine (02725) Admin 1st Vaccine [facility]) [phone]) Gastroenterology Referral (GI) Ophthalmology Referral (Ophthalmology) Surgical Referral (Surgery)  Hypertension Assessment/Plan:      The patient's hypertensive risk group is category C: Target organ damage and/or diabetes.  Today's blood pressure is 127/67.  His blood pressure goal is < 130/80.    Tetanus/Td Vaccine    Vaccine Type: Td    Site: left deltoid    Mfr: Sanofi Pasteur    Dose: 0.5 ml    Route: IM    Given by: Kandice Hams    Exp. Date: 03/12/2010    Lot #: [id number]    VIS given: 08/09/07  version given October 25, 2009.

## 2010-10-23 NOTE — Miscellaneous (Signed)
Summary: Orders Update  Clinical Lists Changes  Orders: Added new Test order of Carotid Duplex (Carotid Duplex) - Signed 

## 2010-10-23 NOTE — Letter (Signed)
Summary: Letter Regarding Colonoscopy Results/[facility]  Letter Regarding Colonoscopy Results/[facility]   Imported By: Lanelle Bal 11/19/2009 14:13:12  _____________________________________________________________________  External Attachment:    Type:   Image     Comment:   External Document

## 2010-10-23 NOTE — Assessment & Plan Note (Signed)
Summary: bronchitis   Vital Signs:  Patient profile:   70 year old male Height:      70 inches Weight:      196 pounds BMI:     28.22 O2 Sat:      98 % on Room air Temp:     98.2 degrees F oral Pulse rate:   71 / minute BP sitting:   124 / 63  (left arm) Cuff size:   regular  Vitals Entered By: Payton Spark CMA (Jan 28, 2010 12:57 PM)  O2 Flow:  Room air CC: Chest congestion and lots of green mucus x 2 weeks.   Primary Care Provider:  Seymour Bars DO  CC:  Chest congestion and lots of green mucus x 2 weeks.Marland Kitchen  History of Present Illness: 70 yo WM presents for 2-3 wks fo chest congestion with a productive cough of green phlegm.  It bothers him mostly at night.  He tried claritin in the past.  Denies chest tightness or SOB.  He has nasal congestion and runny nose.  Not sneezing.  Denies fevers, chills or SOB.  Had hx of asthma as a kid.  This seems to recur at this time of the year.    As f/u from his last OV with me, he had seen Dr Patsi Sears and oncology for an elevated alk phos with hx of prostate cancer.  Fortunately, his workup turned out to be negative for metastatic prostate cancer and the elevated was from rib trauma after a fall in the ice.  He is pleased with this news.    Allergies: 1)  ! * Sodium Pentothal  Past History:  Past Medical History: Reviewed history from 09/06/2009 and no changes required. 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   Has 4 kids, 2 are local. Quit smoking 8-08 after 20 pack years. 1 ETOH/ wk.  Fair diet. No regular exercise.  Review of Systems      See HPI  Physical Exam  General:  alert, well-developed, well-nourished, and well-hydrated.   Head:  normocephalic and atraumatic.   Eyes:  conjunctiva clear Ears:  EACs patent; TMs translucent and gray with good cone of light and bony landmarks.  Nose:  nasal congestion present Mouth:  good dentition and pharynx pink and moist.   Neck:  no masses.   Lungs:  Normal respiratory effort, chest expands symmetrically. Lungs are clear to auscultation, no crackles or wheezes.  + bibasilar rhonchi with cough Heart:  Normal rate and regular rhythm. S1 and S2 normal without gallop, murmur, click, rub or other extra sounds. Pulses:  2+ radial pulses Extremities:  no LE edema Skin:  color normal and no rashes.   Cervical Nodes:  shotty submandibular LA   Impression & Recommendations:  Problem # 1:  ACUTE BRONCHITIS (ICD-466.0) Add Prednisone burst for underlying allergy flare up to regimen below.  Call if not improved in 7-10 days. His updated medication list for this problem includes:    Zithromax Z-pak 250 Mg Tabs (Azithromycin) .Marland Kitchen... Take as directed x 5 days    Hydrocodone-homatropine 5-1.5 Mg/38ml Syrp (Hydrocodone-homatropine) .Marland KitchenMarland KitchenMarland KitchenMarland Kitchen 5 ml by mouth at bedtime as needed cough  Complete Medication List: 1)  Plavix 75 Mg Tabs (Clopidogrel bisulfate) .... Take 1 tablet by mouth once a day 2)  Metoprolol Succinate 100 Mg Tb24 (Metoprolol succinate) .... Take 1 tablet by mouth once a day 3)  Glipizide-metformin Hcl 2.5-500 Mg Tabs (Glipizide-metformin hcl) .... 2 tabs by mouth bid 4)  [facility]vs Aspirin 325 Mg Tabs (Aspirin) .... Take 1 tablet by mouth once a day 5)  Cardura 2 Mg Tabs (Doxazosin mesylate) .Marland Kitchen.. 1 tab by mouth q hs 6)  Lisinopril 40 Mg Tabs (Lisinopril) .... Take 1 tablet by mouth once a day 7)  Zocor 80 Mg Tabs (Simvastatin) .... Take 1/2  tablet by  mouth at bedtime 8)  Hydrochlorothiazide 25 Mg Tabs (Hydrochlorothiazide) .... Take one tablet by mouth daily. 9)  Zithromax Z-pak 250 Mg Tabs (Azithromycin) .... Take as directed x 5 days 10)  Hydrocodone-homatropine 5-1.5 Mg/99ml Syrp (Hydrocodone-homatropine) .... 5 ml by mouth at bedtime as needed cough 11)  Prednisone 20 Mg Tabs (Prednisone) .... 2 tabs by mouth qam x 5 days  Other Orders: Fingerstick ([address]) Hemoglobin A1C (60454)  Patient Instructions: 1)  Take Prednisone 2 tabs each morning x 5 days for bronchitis. 2)  Take 5 days of Zithromax for bronchitis. 3)  Use RX cough syrup at night and OTC Mucinex DM in the AM. 4)  Call if not improved in 7-10 days. Prescriptions: PREDNISONE 20 MG TABS (PREDNISONE) 2 tabs by mouth qAM x 5 days  #10 x 0   Entered and Authorized by:   Seymour Bars DO   Signed by:   Seymour Bars DO on 01/28/2010   Method used:   Electronically to        [facility] [phone]* (retail)       [address] [place], [place]  [address]       Ph: [phone]       Fax: [phone]   RxID:   [id number] HYDROCODONE-HOMATROPINE 5-1.5 MG/5ML SYRP (HYDROCODONE-HOMATROPINE) 5 ml by mouth at bedtime as needed cough  #120 ml x 0   Entered and Authorized by:   Seymour Bars DO   Signed by:   Seymour Bars DO on 01/28/2010   Method used:   Printed then faxed to ...       [facility] [phone]* (retail)       [address] [place], [place]  [address]       Ph: [phone]       Fax: [phone]   RxID:   [id number] ZITHROMAX Z-PAK 250 MG TABS (AZITHROMYCIN) take as directed x 5 days  #1 pack x 0   Entered and Authorized by:   Seymour Bars DO   Signed by:   Seymour Bars DO on 01/28/2010   Method used:   Electronically to        [facility] [phone]* (retail)       [address] [place], [place]  [address]       Ph: [phone]       Fax: [phone]   RxID:   [id number]   Laboratory Results   Blood Tests     HGBA1C: 5.7%    (Normal Range: Non-Diabetic - 3-6%   Control Diabetic - 6-8%)

## 2010-10-23 NOTE — Consult Note (Signed)
Summary: [facility]   Imported By: Lanelle Bal 11/21/2009 10:02:26  _____________________________________________________________________  External Attachment:    Type:   Image     Comment:   External Document

## 2010-10-23 NOTE — Progress Notes (Signed)
Summary: need pain med seen today  Phone Note Call from Patient   Caller: Patient Summary of Call: pt called left msg was seen today forgot to mention would like pain med Percocoet for the fall the fall he had send to [facility] Initial call taken by: Kandice Hams,  October 25, 2009 1:11 PM  Follow-up for Phone Call        percocet RX has to be picked up.  will print. Follow-up by: Seymour Bars DO,  October 25, 2009 1:14 PM    New/Updated Medications: PERCOCET 5-325 MG TABS (OXYCODONE-ACETAMINOPHEN) 1 tab by mouth q 8 hrs as needed severe pain Prescriptions: PERCOCET 5-325 MG TABS (OXYCODONE-ACETAMINOPHEN) 1 tab by mouth q 8 hrs as needed severe pain  #30 x 0   Entered and Authorized by:   Seymour Bars DO   Signed by:   Seymour Bars DO on 10/25/2009   Method used:   Print then Give to Patient   RxID:   [id number]   Appended Document: need pain med seen today pt informed will pick rx on Monday. rx up front

## 2010-10-23 NOTE — Assessment & Plan Note (Signed)
Summary: 6 MO F/U   Primary Provider:  Seymour Bars DO   History of Present Illness: [name] [name] is seen today in F/U for CAD, HTN and elevated chol.  His wife is a patient of mine His wife just past away from agressive lung cancer. .  I believe he's had a stent to the OM in 96 and subsequent intervention to LAD.  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  He is not having any SSCP, palpitatoins SOB or edema.  He has been compliant with his meds.  His LDL has been under 100 with normal LFT's   He has 60-79% LICA stenosis by duplex today which I reviewed ABI's are .91 on Right an                 .68 on Left  He has some venous insuf and discoloration in the lower legs but no clinical claudication  Current Problems (verified): 1)  Acute Bronchitis  (ICD-466.0) 2)  Alkaline Phosphatase, Elevated  (ICD-790.5) 3)  Hemorrhoids, External  (ICD-455.3) 4)  Health Screening  (ICD-V70.0) 5)  Special Screening For Malignant Neoplasms Colon  (ICD-V76.51) 6)  Nevus, Atypical  (ICD-216.9) 7)  Screening For Lipoid Disorders  (ICD-V77.91) 8)  Special Screening Malignant Neoplasm of Prostate  (ICD-V76.44) 9)  Abdominal Bruit  (ICD-785.9) 10)  Muscle Strain, Abdominal Wall  (ICD-848.8) 11)  Dyspepsia  (ICD-536.8) 12)  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) 13)  Rotator Cuff Syndrome, Left  (ICD-726.10) 14)  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 Take 1 Tablet By Mouth Once A Day 2)  Metoprolol Succinate 100 Mg  Tb24 (Metoprolol Succinate) .... Take 1/2  Tablet By Mouth Once A Day 3)  Glipizide-Metformin Hcl 2.5-500 Mg  Tabs (Glipizide-Metformin Hcl) .... 2 Tabs By Mouth Bid 4)  Cvs Aspirin 325 Mg  Tabs (Aspirin) .... Take 1 Tablet By Mouth Once A Day 5)  Cardura 2 Mg  Tabs (Doxazosin Mesylate) .Marland Kitchen.. 1 Tab By Mouth Q Hs 6)  Lisinopril 40 Mg Tabs (Lisinopril) .... Take 1 Tablet By Mouth Once A Day 7)  Zocor 80 Mg Tabs (Simvastatin) .... Take 1/2  Tablet By Mouth At Bedtime 8)  Hydrochlorothiazide 25 Mg Tabs (Hydrochlorothiazide) .... Take One Tablet By Mouth Daily.  Allergies (verified): 1)  ! 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  Has 4 kids, 2 are local. Quit smoking 8-08 after 20 pack  years. 1 ETOH/ wk.  Fair diet. No regular exercise.  Review of Systems       Denies fever, malais, weight loss, blurry vision, decreased visual acuity, cough, sputum, SOB, hemoptysis, pleuritic pain, palpitaitons, heartburn, abdominal pain, melena, lower extremity edema, claudication, or rash.   Vital Signs:  Patient profile:   70 year old male Height:      70  inches Weight:      196 pounds BMI:     28.22 Pulse rate:   60 / minute Resp:     18 per minute BP sitting:   135 / 68  (left arm)  Vitals Entered By: Kem Parkinson (April 21, 2010 1:58 PM)  Physical Exam  General:  Affect appropriate Healthy:  appears stated age HEENT: normal Neck supple with no adenopathy JVP normal bilateral  bruits no thyromegaly Lungs clear with no wheezing and good diaphragmatic motion Heart:  S1/S2 no murmur,rub, gallop or click PMI normal Abdomen: benighn, BS positve, no tenderness, no AAA no bruit.  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 Take 1 tablet by mouth once a day    Metoprolol Succinate 100 Mg Tb24 (Metoprolol succinate) .Marland Kitchen... Take 1/2  tablet by mouth once a day    Cvs Aspirin 325 Mg Tabs (Aspirin) .Marland Kitchen... Take 1 tablet by mouth once a day    Lisinopril 40 Mg Tabs (Lisinopril) .Marland Kitchen... Take 1 tablet by mouth once a day  Problem # 3:  BRUIT (ICD-785.9) F/U duplex in 6 months.  LICA 60-79% but stable.  Continue ASA and Plavix   EKG Report  Procedure date:  04/21/2010  Findings:      NSR 63 PVC Otherwise normal

## 2010-10-23 NOTE — Assessment & Plan Note (Signed)
Summary: boil   Vital Signs:  Patient profile:   70 year old male Height:      70 inches Weight:      195 pounds BMI:     28.08 O2 Sat:      100 % on Room air Pulse rate:   52 / minute BP sitting:   128 / 66  (left arm) Cuff size:   large  Vitals Entered By: Payton Spark CMA (August 25, 2010 1:08 PM)  O2 Flow:  Room air CC: Boil on L side of neck x 2 months.   Primary Care Corrin Hingle:  Seymour Bars DO  CC:  Boil on L side of neck x 2 months..  History of Present Illness: 1.2 x 1.0 cm L side of neck  70 yo WM presents for a cyst over the L side of his neck x 2 mos.  It was hard but now is soft and non painful.  Denies redness, bleeding, drainage or fevers.    He hit is R shin on some a metal chair a month ago and still has a lump and pain just medial to his tibia.  Bruising is improving.  No pain at night or with walking.  Denies any lacerations or abrasions.    Allergies: 1)  ! * Sodium Pentothal  Past History:  Past Medical History: Reviewed history from 05/23/2010 and no changes required. 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  Has 4 kids, 2 are local. Quit smoking 8-08 after 20 pack years. 1 ETOH/ wk.  Fair diet. No regular exercise.  Review of Systems      See HPI  Physical Exam  General:  alert, well-developed, well-nourished, and well-hydrated.   Neck:  no masses.   Extremities:  quarter sized scar, hard and tender just  medial to the R tibia, halfway down with hemosiderin deposits.  No skin breakdown. Skin:  1.2 x 1 cm soft flesh colored sebaceous cyst over the L lateral neck at the hair line. No redness.  Nontender. Cervical Nodes:  No lymphadenopathy noted Psych:  good eye contact, not anxious appearing, and not depressed appearing.     Impression & Recommendations:  Problem # 1:  SEBACEOUS CYST (ICD-706.2) Small soft sebaceous cyst over the L side of the neck.  Since it is nontender and not infected today, does not require I&D. Will treat with soap, warm compresses and a trial of topical benzoyl peroxide to dry it up.  This may not work.  I have given him instructions to come back if the lesion becomes hard, painful, tender or red.  Problem # 2:  CONTUSION, LEG (ICD-924.5) Improving over time.  His history is NEGATIVE for a fracture, esp considering this occured a month ago.  He still has a hard scar just medial to the tibia.  Can use ice packs if needed for tenderness.  Complete Medication List: 1)  Plavix 75 Mg Tabs (Clopidogrel bisulfate) .... Take 1 tablet by mouth once a day 2)  Metoprolol Succinate 100 Mg Tb24 (Metoprolol succinate) .... Take 1/2  tablet by mouth once a day 3)  Glipizide-metformin Hcl 2.5-500 Mg Tabs (Glipizide-metformin  hcl) .... 2 tabs by mouth bid 4)  Cvs Aspirin 325 Mg Tabs (Aspirin) .... Take 1 tablet by mouth once a day 5)  Cardura 2 Mg Tabs (Doxazosin mesylate) .Marland Kitchen.. 1 tab by mouth q hs 6)  Lisinopril 40 Mg Tabs (Lisinopril) .... Take 1 tablet by mouth once a day 7)  Zocor 80 Mg Tabs (Simvastatin) .... Take 1/2  tablet by mouth at bedtime 8)  Hydrochlorothiazide 25 Mg Tabs (Hydrochlorothiazide) .... Take one tablet by mouth daily. 9)  Famotidine 40 Mg Tabs (Famotidine) .Marland Kitchen.. 1 tab by mouth bid 10)  Tramadol Hcl 50 Mg Tabs (Tramadol hcl) .Marland Kitchen.. 1 tab by mouth two times a day as needed pain  Other Orders: Flu Vaccine 71yrs + MEDICARE PATIENTS (W1191) Administration Flu vaccine  - MCR (Y7829)  Patient Instructions: 1)  Use warm water compresses over boil on the L side of the neck. 2)  Once skin is clean and dry, use topical Benzoyl Peroxide (apply a small dab 2 x a day).  This may help dry it up. 3)  If it becomes, hard, red, painful or larger, return to have it lanced. 4)  Use a cold compress for contusion over the R lower leg.   5)  Use Tramadol as needed for severe pain. Prescriptions: TRAMADOL HCL 50 MG TABS (TRAMADOL HCL) 1 tab by mouth two times a day as needed pain  #60 x 0   Entered and Authorized by:   Seymour Bars DO   Signed by:   Seymour Bars DO on 08/25/2010   Method used:   Electronically to        [facility] [phone]* (retail)       [address] [place], [place]  [address]       Ph: [phone]       Fax: [phone]   RxID:   [id number]  Flu Vaccine Consent Questions     Do you have a history of severe allergic reactions to this vaccine? no    Any prior history of allergic reactions to egg and/or gelatin? no    Do you have a sensitivity to the preservative Thimersol? no    Do you have a past history of Guillan-Barre Syndrome? no    Do you currently have an acute febrile illness? no    Have you ever had a severe reaction to latex? no    Vaccine information given and explained to patient? yes    Are you currently pregnant? no    Lot Number:[id number]   Exp Date:03/21/2011   Site Given  Left Deltoid IM  Orders Added: 1)  Flu Vaccine 41yrs + MEDICARE PATIENTS [Q2039] 2)  Administration Flu vaccine - MCR [G0008] 3)  Est. Patient Level III [95638]     .lbmedflu

## 2010-10-23 NOTE — Progress Notes (Signed)
Summary: cheaper BS med  Phone Note Call from Patient Call back at Home Phone [phone]   Caller: Patient Call For: Seymour Bars DO Summary of Call: The Glipizide-Metformin went from 15.00 to 280.00 dollars at [facility] so pt did not pick up. [facility] said [facility] is cheaper and wanted to know if you could send maybe something cheaper for him there. [facility] Initial call taken by: Kathlene November LPN,  September 01, 2010 8:35 AM  Follow-up for Phone Call        I simply spit him up to metformin and glipizide separately (same doses).  RX's sent to [facility]. Follow-up by: Seymour Bars DO,  September 01, 2010 8:38 AM    New/Updated Medications: METFORMIN HCL 1000 MG TABS (METFORMIN HCL) 1 tab by mouth two times a day with food GLIPIZIDE 5 MG TABS (GLIPIZIDE) 1 tab by mouth two times a day with breakfast and dinner Prescriptions: GLIPIZIDE 5 MG TABS (GLIPIZIDE) 1 tab by mouth two times a day with breakfast and dinner  #60 x 3   Entered and Authorized by:   Seymour Bars DO   Signed by:   Seymour Bars DO on 09/01/2010   Method used:   Electronically to        [facility] [phone]* (retail)       [address] [place], [place]  [address]       Ph: [phone]       Fax: [phone]   RxID:   [id number] METFORMIN HCL 1000 MG TABS (METFORMIN HCL) 1 tab by mouth two times a day with food  #60 x 3   Entered and Authorized by:   Seymour Bars DO   Signed by:   Seymour Bars DO on 09/01/2010   Method used:   Electronically to        [facility] [phone]* (retail)       [address] [place], [place]  [address]       Ph: [phone]       Fax: [phone]   RxID:   [id number]

## 2010-10-23 NOTE — Miscellaneous (Signed)
Summary: colonoscopy  Clinical Lists Changes  Observations: Added new observation of COLONRECACT: Repeat colonoscopy in 5 years.  (11/13/2009 13:16) Added new observation of COLONOSCOPY: Location:  [facility].    diverticulosis tender DRE   (11/13/2009 13:16)      Colonoscopy  Procedure date:  11/13/2009  Findings:      Location:  [facility].    diverticulosis tender DRE    Comments:      Repeat colonoscopy in 5 years.    Colonoscopy  Procedure date:  11/13/2009  Findings:      Location:  [facility].    diverticulosis tender DRE    Comments:      Repeat colonoscopy in 5 years.
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
## 2010-10-24 ENCOUNTER — Encounter: Payer: Self-pay | Admitting: Cardiovascular Disease

## 2010-10-24 ENCOUNTER — Ambulatory Visit: Admit: 2010-10-24 | Payer: Self-pay

## 2010-10-24 ENCOUNTER — Ambulatory Visit (INDEPENDENT_AMBULATORY_CARE_PROVIDER_SITE_OTHER): Payer: MEDICARE | Admitting: Cardiovascular Disease

## 2010-10-24 ENCOUNTER — Ambulatory Visit: Admit: 2010-10-24 | Payer: Self-pay | Admitting: Cardiovascular Disease

## 2010-10-24 ENCOUNTER — Encounter (INDEPENDENT_AMBULATORY_CARE_PROVIDER_SITE_OTHER): Payer: MEDICARE

## 2010-10-24 DIAGNOSIS — I10 Essential (primary) hypertension: Secondary | ICD-10-CM

## 2010-10-24 DIAGNOSIS — I6529 Occlusion and stenosis of unspecified carotid artery: Secondary | ICD-10-CM

## 2010-10-24 DIAGNOSIS — I2510 Atherosclerotic heart disease of native coronary artery without angina pectoris: Secondary | ICD-10-CM

## 2010-10-24 DIAGNOSIS — R0989 Other specified symptoms and signs involving the circulatory and respiratory systems: Secondary | ICD-10-CM

## 2010-10-24 DIAGNOSIS — E785 Hyperlipidemia, unspecified: Secondary | ICD-10-CM

## 2010-10-29 NOTE — Miscellaneous (Signed)
Summary: Orders Update  Clinical Lists Changes  Orders: Added new Test order of Carotid Duplex (Carotid Duplex) - Signed 

## 2010-10-29 NOTE — Assessment & Plan Note (Signed)
Summary: F6M/DM/BH appt confirm=mj   Vital Signs:  Patient profile:   70 year old male Height:      70 inches Weight:      192 pounds BMI:     27.65 Pulse rate:   56 / minute BP sitting:   130 / 60  (left arm) Cuff size:   large  Vitals Entered By: Burnett Kanaris, CNA (October 24, 2010 10:51 AM)  Primary Provider:  Seymour Bars DO   History of Present Illness: [name] [name] is seen today in F/U for CAD, HTN and elevated chol.  His wife is a patient of mine His wife  past away from agressive lung cancer last year . .  I believe he's had a stent to the OM in 96 and subsequent intervention to LAD.  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  He is not having any SSCP, palpitatoins SOB or edema.  He has been compliant with his meds.  His LDL has been under 100 with normal LFT's   He has 60-79% LICA stenosis by duplex 8/11 which I reviewed  STable on duplex today also reviewed ABI's are .91 on Right and   .68 on Left  He has some venous insuf and discoloration in the lower legs but no clinical claudication  Wants to change to generic Hyzaar instead of lisinopril and HCTZ  Current Medications (verified): 1)  Plavix 75 Mg  Tabs (Clopidogrel Bisulfate) .... Take 1 Tablet By Mouth Once A Day 2)  Metoprolol Succinate 100 Mg  Tb24 (Metoprolol Succinate) .... Take 1/2  Tablet By Mouth Once A Day 3)  Metformin Hcl 1000 Mg Tabs (Metformin Hcl) .Marland Kitchen.. 1 Tab By Mouth Two Times A Day With Food 4)  Cvs Aspirin 325 Mg  Tabs (Aspirin) .... Take 1 Tablet By Mouth Once A Day 5)  Cardura 2 Mg  Tabs (Doxazosin Mesylate) .Marland Kitchen.. 1 Tab By Mouth Q Hs 6)  Lisinopril 40 Mg Tabs (Lisinopril) .... Take 1 Tablet By Mouth Once A Day 7)  Zocor 80 Mg Tabs (Simvastatin) .... Take 1/2  Tablet By Mouth At Bedtime 8)  Hydrochlorothiazide 25 Mg Tabs (Hydrochlorothiazide) .... Take One Tablet By Mouth Daily. 9)  Famotidine 40 Mg Tabs (Famotidine) .Marland Kitchen.. 1 Tab By Mouth Bid 10)  Tramadol Hcl 50 Mg Tabs (Tramadol  Hcl) .Marland Kitchen.. 1 Tab By Mouth Two Times A Day As Needed Pain 11)  Glipizide 5 Mg Tabs (Glipizide) .Marland Kitchen.. 1 Tab By Mouth Two Times A Day With Breakfast and Dinner  Allergies (verified): 1)  ! 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  Has 4 kids, 2 are local. Quit smoking 8-08 after 20 pack years. 1 ETOH/ wk.  Fair diet. No regular exercise.  Review of Systems       Denies fever, malais, weight loss, blurry vision, decreased visual acuity, cough, sputum, SOB, hemoptysis, pleuritic pain, palpitaitons, heartburn, abdominal pain, melena, lower extremity  edema, claudication, or rash.   Physical Exam  General:  Affect appropriate Healthy:  appears stated age HEENT: normal Neck supple with no adenopathy JVP normal left  bruits no thyromegaly Lungs clear with no wheezing and good diaphragmatic motion Heart:  S1/S2 no murmur,rub, gallop or click PMI normal Abdomen: benighn, BS positve, no tenderness, no AAA no bruit.  No HSM or HJR Distal pulses intact with no bruits No edema Neuro non-focal Skin warm and dry    Impression & Recommendations:  Problem # 1:  ATHEROSCLEROSIS W /INT CLAUDICATION (ICD-440.21) STable  consider F/U ABI's in a year or earlier if LLE claudication occurs  Problem # 2:  CAD (ICD-414.00) Stable no angina The following medications were removed from the medication list:    Lisinopril 40 Mg Tabs (Lisinopril) .Marland Kitchen... Take 1 tablet by mouth once a day His updated medication list for this problem includes:    Plavix 75 Mg Tabs (Clopidogrel bisulfate) .Marland Kitchen... Take 1 tablet by mouth once a day    Metoprolol Succinate 100 Mg Tb24 (Metoprolol succinate) .Marland Kitchen... Take 1/2  tablet by mouth once a day    Cvs Aspirin 325 Mg Tabs (Aspirin) .Marland Kitchen... Take 1 tablet by mouth once a day  Problem # 3:  HYPERTENSION, UNSPECIFIED (ICD-401.9) Change to Hyzaar and continue low sodium diet The following medications were removed from the medication list:    Lisinopril 40 Mg Tabs (Lisinopril) .Marland Kitchen... Take 1 tablet by mouth once a day    Hydrochlorothiazide 25 Mg Tabs (Hydrochlorothiazide) .Marland Kitchen... Take one tablet by mouth daily. His updated medication list for this problem includes:    Metoprolol Succinate 100 Mg Tb24 (Metoprolol succinate) .Marland Kitchen... Take 1/2  tablet by mouth once a day    Cvs Aspirin 325 Mg Tabs (Aspirin) .Marland Kitchen... Take 1 tablet by mouth once a day    Cardura 2 Mg Tabs (Doxazosin mesylate) .Marland Kitchen... 1 tab by mouth q hs    Hyzaar 100-25 Mg Tabs (Losartan potassium-hctz) .Marland Kitchen... Take one tablet by mouth daily.  Patient Instructions: 1)  Your physician recommends that you schedule a follow-up appointment in: 6 months 2)  Your physician has recommended you make the following change in your medication: STOP Lisinopril and HCTZ and START Hyzaar 100-25mg  by mouth daily.  Prescriptions: HYZAAR 100-25 MG TABS (LOSARTAN POTASSIUM-HCTZ) take one tablet by mouth daily.  #30 x 8   Entered by:   Whitney Maeola Sarah RN   Authorized by:   Colon Branch, MD, [facility]   Signed by:   Ellender Hose RN on 10/24/2010   Method used:   Electronically to        [facility] [phone]* (retail)       [address] [place], [place]  [address]       Ph: [phone]       Fax: [phone]   RxID:   [id number] CARDURA 2 MG  TABS (DOXAZOSIN MESYLATE) 1 tab by mouth q HS  #90 x 3   Entered by:   Whitney Maeola Sarah RN   Authorized by:   Colon Branch, MD, [facility]   Signed by:   Ellender Hose RN on 10/24/2010   Method used:   Electronically to        [facility] [phone]* (retail)       [address] [place], [place]  [address]  Ph: [phone]       Fax: [phone]   RxID:   [id number]

## 2010-12-05 LAB — COMPREHENSIVE METABOLIC PANEL
AST: 18 U/L (ref 0–37)
Albumin: 4.1 g/dL (ref 3.5–5.2)
Chloride: 106 mEq/L (ref 96–112)
Creatinine, Ser: 1.08 mg/dL (ref 0.4–1.5)
GFR calc Af Amer: >60 mL/min (ref >60)
Potassium: 4.1 mEq/L (ref 3.5–5.1)
Total Bilirubin: 0.6 mg/dL (ref 0.3–1.2)

## 2010-12-05 LAB — DIFFERENTIAL
Basophils Absolute: 0.1 10*3/uL (ref 0.0–0.1)
Eosinophils Relative: 2 % (ref 0–5)
Lymphocytes Relative: 17 % (ref 12–46)
Monocytes Absolute: 0.6 10*3/uL (ref 0.1–1.0)

## 2010-12-05 LAB — MAGNESIUM: Magnesium: 1.8 mg/dL (ref 1.5–2.5)

## 2010-12-05 LAB — POCT CARDIAC MARKERS: Myoglobin, poc: 105 ng/mL (ref 12–200)

## 2010-12-05 LAB — CBC
Platelets: 133 10*3/uL — ABNORMAL LOW (ref 150–400)
RBC: 4.41 MIL/uL (ref 4.22–5.81)
WBC: 8.7 10*3/uL (ref 4.0–10.5)

## 2010-12-05 LAB — PROTIME-INR: Prothrombin Time: 13.3 seconds (ref 11.6–15.2)

## 2010-12-12 ENCOUNTER — Telehealth: Payer: Self-pay | Admitting: Cardiovascular Disease

## 2010-12-12 DIAGNOSIS — E785 Hyperlipidemia, unspecified: Secondary | ICD-10-CM

## 2010-12-12 NOTE — Telephone Encounter (Signed)
Simvastatin 80 mg 90 day supply[facility] [place]

## 2010-12-15 MED ORDER — SIMVASTATIN 80 MG PO TABS: 40.0000 mg | ORAL_TABLET | Freq: Every day | ORAL | Status: DC

## 2010-12-25 ENCOUNTER — Other Ambulatory Visit: Payer: Self-pay | Admitting: Cardiovascular Disease

## 2010-12-25 MED ORDER — CLOPIDOGREL BISULFATE 75 MG PO TABS: 75.0000 mg | ORAL_TABLET | Freq: Every day | ORAL | Status: DC

## 2010-12-25 NOTE — Telephone Encounter (Signed)
Pt states he needs plavix..the patient wants to talk a nurse re med before refill

## 2011-01-05 ENCOUNTER — Other Ambulatory Visit: Payer: Self-pay | Admitting: Family Medicine

## 2011-01-06 ENCOUNTER — Telehealth: Payer: Self-pay | Admitting: Family Medicine

## 2011-01-06 MED ORDER — TRAMADOL HCL 50 MG PO TABS: 50.0000 mg | ORAL_TABLET | Freq: Two times a day (BID) | ORAL | Status: DC | PRN

## 2011-01-06 NOTE — Telephone Encounter (Signed)
Tramadol RFd. 

## 2011-01-22 ENCOUNTER — Other Ambulatory Visit: Payer: Self-pay | Admitting: Family Medicine

## 2011-02-03 NOTE — Assessment & Plan Note (Signed)
[facility]                            CARDIOLOGY OFFICE NOTE   NAME:[name] [name], [name] [name]                          MRN:          [id number]  DATE:01/06/2008                            DOB:          1940/11/14    Ho returns today for follow-up.  He has coronary artery disease with a  total right that is collateralized and moderate left-sided disease.  He  needs a follow-up Myoview in a year.  He has not had any significant  chest pain, PND or orthopnea.  There have been no palpitations or  syncope.   He has been compliant with his medications.  He has stopped seeing Birdena Jubilee, MD, and is now seeing one of the primary care doctors, Seymour Bars, D.O., out in [place] since I last saw him.   REVIEW OF SYSTEMS:  This is remarkable for some urinary frequency at  night, for which he has been started on Cardura.  I reviewed his lab  work from Dr. Ovidio Kin office.  His LDL was in a good range at 56.  LFTs  were normal.  Potassium is 4.8, BUN was 18, creatinine was 1.0.   His TSH was 3.06.  Hemoglobin A1c was 6.3.   CURRENT MEDICATIONS:  1. Zocor half of an 80 mg tablet a day.  2. Plavix 75 mg a day.  3. Glipizide/metformin 2.5/500 mg b.i.d.  4. An aspirin a day.  5. Hyzaar 100/25 mg.  6. Toprol half of a 100 mg tablet a day.  7. Cardura 1 mg nightly.   He uses Psychologist, forensic.   Exam is remarkable for jovial, middle-aged white male in no distress.  Weight is 219, blood pressure is 120/72, pulse 62 and regular, afebrile,  respiratory rate 14.  HEENT:  Unremarkable.  Carotids have a faint right bruit.  There is no lymphadenopathy,  thyromegaly or JVP elevation.  LUNGS:  Clear are clear, good diaphragmatic motion.  No wheezing.  S1, S2, normal heart sounds.  PMI normal.  ABDOMEN:  Benign.  Bowel sounds positive.  No AAA, no tenderness, no  hepatosplenomegaly or hepatojugular reflux.  Distal pulses are intact, no edema.  NEUROLOGIC:  Nonfocal.  SKIN:  Warm and dry.  No muscular weakness.   I reviewed his carotid duplex study done today.  He had 40-59% left ICA  stenosis with an elevated velocity max at 168 cm/sec.   IMPRESSION:  1. Coronary disease.  Follow-up Myoview in a year.  Continue aspirin      and beta blocker.  2. Hypertension, currently well-controlled.  Continue current dose of      Hyzaar, low-salt diet.  3. Carotid bruit with moderate left-sided disease.  Follow-up duplex      in a year.  4. Prostatism.  Continue Cardura.  Dr. Cathey Endow to check PSA, hopefully      in the next 3-6 months.  5. Hyperlipidemia.  LDL in a good range.  Continue Zocor.  LFTs      normal.  A low-cholesterol diet.  6. Diabetes.  Hemoglobin A1c in the 6 range,  which is excellent.      Continue monitoring and use of oral hypoglycemics.   I will see Nicklous back in 6 months.     Noralyn Pick. Eden Emms, MD, [facility]  Electronically Signed    PCN/MedQ  DD: 01/06/2008  DT: 01/06/2008  Job #: [id number]

## 2011-02-03 NOTE — Consult Note (Signed)
[name] [name], [name] [name] NO.:  [id number]   MEDICAL RECORD NO.:  [id number]          PATIENT TYPE:  EMS   LOCATION:  MAJO                         FACILITY:  [facility]   PHYSICIAN:  Madolyn Frieze. Jens Som, MD, FACCDATE OF BIRTH:  October 13, 1940   DATE OF CONSULTATION:  DATE OF DISCHARGE:                                 CONSULTATION   Primary cardiologist Dr. Charlton Haws, MD, [facility].   </PATIENT PROFILE.<  [age]-year-old Caucasian male, prior history of CAD who presented  to the ED with a 24-hour history of intermittent abdominal discomfort  with bloating, flatulence and belching.   PROBLEM LIST:  1. CAD.  1A.  Status post PCI of the left circumflex in 1996.  1B.  02/27/2004 non-ST-elevation MI.  Catheterization revealing total  occlusion of the mid left circumflex and a 90% stenosis in the first  obtuse marginal.  The OM-1 was successfully angioplastied.  1C.  06/29/2005, adenosine Myoview showing an EF of 57% with hypokinesis of  the inferior wall at the base.  There was a small prior inferobasal  infarct and mild ischemia in the inferior wall.  Follow-up  catheterization revealed medically manageable disease.  1D.  04/19/2006, cardiac catheterization and PCI.  Left main 20%.  LAD  90% proximal, 75/70% mid, 50% distal.  Left circumflex 100% mid.  RCA  60% ostial, 100% mid.  EF 55%.  The proximal LAD was successfully  stented with a 2.75 x 16-mm Taxus drug-eluting stent.  1. Hypertension.  2. Hyperlipidemia.  3. Type 2 diabetes mellitus.  4. Remote tobacco abuse.  5. Obesity.  6. Bilateral carotid bruits and disease.  7. Echo 01/03/2007, EF 55-60%.  No regional wall motion abnormalities.      Lopimatous hypertrophy.   HISTORY OF PRESENT ILLNESS:  [age]-year-old Caucasian male with  history of CAD with chronic total occlusions of the RCA and left  circumflex.  He is most recently status post drug-eluting stent  placement in the LAD in July of 2007.  He was in  his usual state of  health until yesterday afternoon when, while showering, he noted a  funny click sensation in his left lower quadrant which was very focal  in nature and lasted just a second or so.  Later, he developed retro  lower abdominal discomfort, gas and bloating, lasting just a few minutes  and resolving with belching and flatulence.  This discomfort occurred  several times through the course of the day and evening but did not  limit his activities.  He had no chest pain or shortness of breath.  He  slept well last night and felt well as morning.  He was riding his lawn  mower this morning and noted that his yard was pretty bumpy.  Every time  he hit a bump he would have recurrence of this abdominal discomfort.  He, therefore, stopped mowing his lawn, called his wife and they came  into the ED.   He says that in the mid 90's he had a similar type of discomfort and  catheterization which showed circumflex disease, but more  recently he  had this exact same symptom (review of notes shows that on 04/06/04 he  came to the ED for this).  He was treated with GI cocktail in the ED  with complete relief at that time.  He is currently asymptomatic and in  very good spirits.   ALLERGIES:  NO KNOWN DRUG ALLERGIES.   HOME MEDICATIONS:  Aspirin 325 mg daily, Hyzaar 100/25 mg daily, Toprol  XL 100 mg daily, Zocor 80 mg daily, Plavix 75 mg daily,  Glipizide/metformin 2.5/500 mg b.i.d., nitroglycerin p.r.n.   FAMILY HISTORY:  Mother died of old age at age 27.  Father died of an MI  at age 61.  He has a brother who has a history of coronary disease and a  sister who is alive and well.   SOCIAL HISTORY:  Lives in [place] with his wife.  He is a Geneticist, molecular.  He is married and has 4 children.  He has about a 40-50 pack-  year history of tobacco abuse, most recently smoking cigars but he quit  that a year ago.  Denies any alcohol or drugs and is not routinely  exercising.   REVIEW  OF SYSTEMS:  Positive for abdominal discomfort as outlined in the  HPI.  Otherwise, all systems reviewed and negative.   PHYSICAL EXAM:  VITAL SIGNS:  Temperature 98.5, heart rate 60,  respirations 18, blood pressure 144/49, pulse ox 97% on room air.  GENERAL:  Pleasant white male in no acute distress, awake, alert and  oriented x3.  HEENT:  Normal.  NECK:  Neck has bilateral carotid bruits.  No JVD.  LUNGS:  Respirations regular and unlabored.  Clear to auscultation.  CARDIAC:  Regular S1-S2.  No S3-S4, murmurs.  ABDOMEN:  Obese, soft, nontender, nondistended.  Bowel sounds present  x4.  EXTREMITIES:  Warm, dry, pink.  No clubbing, cyanosis or edema.  Dorsalis pedis, posterior tibial pulses 2+ and equal bilaterally.   </ACCESSORY CLINICAL FINDINGS.<  Chest x-ray shows bronchitic changes and right basilar atelectasis.  EKG  shows sinus rhythm with a normal axis, a rate of 61 beats per minute.  No acute ST-T changes.   Hemoglobin 14.5, hematocrit 42.9, WBC 7.3, platelets 152.  Sodium 135,  potassium 4, chloride 102, CO2 25.2, BUN 23, creatinine 1.0, CK-MB less  than 1, troponin-I less than 0.05.   ASSESSMENT AND PLAN:  1. Abdominal discomfort.  The patient describes abdominal bloating and      indigestion, improved with belching and flatulence.  These are not      similar to his previous anginal equivalent which was specifically      chest pain and dyspnea.  He is not sure about having symptoms like      this in the mid 90's when he was found to have circumflex disease,      but notes that he definitely had these symptoms about 3 years ago      and were relieved by GI cocktail.  He is currently asymptomatic,      and his point of care markers are negative times 1.  ECG is without      acute changes.  Will check real set of cardiac markers now, and      provided that these are negative, he can go home from the ED.  We      have arranged for followup with Dr. Eden Emms on 07/29 at 2:45  p.m..  2. Coronary artery disease.  See No.  1.  He has not had any discomfort      similar to his previous angina.  Continue his aspirin, beta blocker      ARB, statin and Plavix.  3. Hypertension.  Blood pressure slightly elevated here in the ED.      Will continue his home meds and provided that he is not admitted,      he can have his blood pressure followed up as an outpatient and we      could consider adding Norvasc if his pressures remain high.  4. Hyperlipidemia.  Continue statin therapy.  5. Diabetes mellitus.  Continue home regimen.  6. Remote tobacco abuse, quit 1 year ago.  Continued cessation is      advised.  7. Bilateral carotid bruits.  This is followed by Dr. Eden Emms with      outpatient ultrasounds.  He remains on aspirin and statin therapy.      Nicolasa Ducking, ANP      Madolyn Frieze. Jens Som, MD, [facility]  Electronically Signed    CB/MEDQ  D:  04/15/2007  T:  04/16/2007  Job:  [id number]

## 2011-02-03 NOTE — Assessment & Plan Note (Signed)
[facility]                            CARDIOLOGY OFFICE NOTE   NAME:[name] [name], [name] [name]                          MRN:          [id number]  DATE:07/06/2008                            DOB:          [birth year]    [name] [name] returns today for followup.   He has coronary artery disease.  I do not have his chart in front of me;  however, he has a collateralized right with previous stenting of the  LAD.  The patient has been doing well.  He is not having significant  chest pain, PND, or orthopnea.  He has been compliant with his  medications.  He is complaining about the cost of his Hyzaar and Plavix.  I told him we would see what [facility]'s list is and see if we can switch  him over to a generic form of ARB or ACE.  Unfortunately, the Plavix has  no substitute for.   Given his multivessel disease and previous stent, I preferred to keep  him on Plavix.   The patient has had previous stenting and then a ruptured plaque in the  proximal AP with recurrent events.   He has not had a cath since 2007 while taking Plavix and aspirin.   Otherwise, his risk factors are under good control.  He is on:  1. Zocor 40 a day.  2. Plavix 75 a day.  3. Aspirin a day.  4. Glipizide 2.5/500 b.i.d.  5. Hyzaar 125.  6. Toprol 100 a day.  7. Cardura one a day.   PHYSICAL EXAMINATION:  GENERAL:  Remarkable for jovial male, in no  distress.  SKIN:  He has multiple seborrheic keratoses over his back and one  particularly inner left leg.  VITAL SIGNS:  Weight is 219, blood pressure 120/72,  pulse 63 and  regular, respiratory rate 14, afebrile.  HEENT:  Unremarkable.  NECK:  Carotids normal without bruit.  No lymphadenopathy, thyromegaly,  or JVP elevation.  LUNGS:  Clear, good diaphragmatic motion.  No wheezing.  HEART:  S1 and S2.  Normal heart sounds.  PMI normal.  ABDOMEN:  Benign.  Bowel sounds positive.  No AAA.  No tenderness.  No  bruit.  No hepatosplenomegaly or hepatojugular  reflux.  EXTREMITIES:  Distal pulse are intact.  No edema.  NEURO:  Nonfocal.  SKIN:  Warm and dry.  No muscular weakness.   His EKG shows no acute changes with sinus bradycardia and is otherwise  normal.   IMPRESSION:  1. Coronary artery disease, previous collateralized right with      stenting of the left anterior descending coronary artery x2, small      diagonal disease, not having chest pain, continue aspirin, Plavix,      and beta blocker.  2. Hypertension, currently well controlled.  Continue current dose of      Hyzaar. We will see if we can substitute generic.  Low-salt diet.  3. Hypercholesterolemia.  Continue Zocor.  He tells me that Dr.      Lovell Sheehan has checked his LFTs and LDL recently.  His last  LDL that I      have in the chart from last year was 55 with normal LFTs.  4. Diabetes.  Continue to modify diet.  Continue oral hypoglycemics      with hemoglobin A1c quarterly.  5. Seborrheic keratoses, quite numerous.  A large one on the inside of      left knee.  Referred to Dr. Arminda Resides, Dermatology.     [name] [name]. Eden Emms, MD, [facility]  Electronically Signed    PCN/MedQ  DD: 07/06/2008  DT: 07/07/2008  Job #: [id number]

## 2011-02-03 NOTE — Assessment & Plan Note (Signed)
[name]                            CARDIOLOGY OFFICE NOTE   NAME:[name] [name], [name] STIREWALT                          MRN:          [id number]  DATE:07/15/2007                            DOB:          11-19-1940    [name] [name] [name] returns today for followup.   He has a history of distal circ and RCA disease that was collateralized.  He has had moderate LAD disease.  He has not had any significant chest  pain.  We have treated him medically.  Unfortunately, he has had a  falling out with his primary care doctor, [name] [name] [name], in [place].  She apparently would not see him because he did not have co-pay money  with him at the time of an appointment and it took me awhile to calm  down [name] [name] [name] about this.   I have followed him for his risk factors of hypertension,  hypercholesterolemia, diabetes, coronary artery disease and a right  carotid bruit.   [name] [name] [name] has been doing well.  He has not had any significant chest pain.  He is somewhat active and not having exertional dyspnea, PND or  orthopnea.  There is no palpitations.   He has been compliant with his medications, they have not changed.   In regards to his bruit, he has not had a TIA or a CVA.   His last Duplex study was done I believe 6 months ago and showed 0 to  39% stenosis bilaterally.   This was in April of 2008.   REVIEW OF SYSTEMS:  Otherwise negative.  In particular, his GERD has  improved.   MEDICATIONS:  1. Zocor 40 a day.  2. Hyzaar 100/12.5.  3. Plavix 75 a day.  4. A baby aspirin a day.  5. Toprol 100 a day.  6. Glipizide.  7. Metformin 2.5/500 b.i.d.   His pharmacy is [facility].   EXAM:  Remarkable for a blood pressure 140/80, pulse 70 and regular,  weight was not recorded, respiratory rate 14, afebrile.  HEENT:  Normal.  He has a right carotid bruit.  NECK:  Supple.  There is no lymphadenopathy, no thyromegaly and no JVP  elevation.  LUNGS:  Clear with good diaphragmatic motion, no wheezing.   There is S1  S2 with normal heart sounds.  PMI is normal.  ABDOMEN:  Benign.  Bowel sounds positive.  No tenderness, no  hepatosplenomegaly, no hepatojugular reflux, no bruits.  Femorals are +3 bilaterally without bruit.  PTs are +3.  There is no  lower extremity edema.  NEURO:  Nonfocal.  There is no muscular weakness.   His baseline EKG is normal.   IMPRESSION:  1. Coronary disease, currently asymptomatic.  Collateralized right      circumflex with moderate left anterior descending disease.  Last      catheterization in 2007.  Followup Myoview in a year.  Continue      beta-blocker and aspirin.  2. Risk factor modification.  Continue to take statin drug.  Last LDL      cholesterol was under 100.  He has recent blood  work that we need      to get from Dr. Alberteen Sam.  No myalgias or liver abnormalities.  He      also has a hemoglobin A1c pending which we will review.  3. History of hypertension.  Currently better controlled on      angiotensin-receptor blocker in light of his diabetes.  Continue      low-salt diet.  4. Right carotid bruit.  Followup Duplex in a year.  No critical      stenosis by serial ultrasound.   Overall [name] [name] is doing well and I suspect he will try to see new primary  care doctor.  I suggested since he lives in the [place] area, Dr.  Linford Arnold or one of the [facility] physicians in [place].     [name] [name] [name]. [name] Emms, MD, [name]  Electronically Signed    PCN/MedQ  DD: 07/15/2007  DT: 07/15/2007  Job #: [id number]

## 2011-02-03 NOTE — Discharge Summary (Signed)
[name], [name] [name] NO.:  [id number]   MEDICAL RECORD NO.:  [id number]          PATIENT TYPE:  EMS   LOCATION:  MAJO                         FACILITY:  [facility]   PHYSICIAN:  Nicolasa Ducking, ANP DATE OF BIRTH:  [birth year]   DATE OF ADMISSION:  04/15/2007  DATE OF DISCHARGE:                               DISCHARGE SUMMARY   NO DICTATION.      Nicolasa Ducking, ANP     CB/MEDQ  D:  04/15/2007  T:  04/16/2007  Job:  [id number]

## 2011-02-03 NOTE — Assessment & Plan Note (Signed)
[facility]                            CARDIOLOGY OFFICE NOTE   NAME:Macinnes, HASSAAN CRITE                          MRN:          [id number]  DATE:04/19/2007                            DOB:          [birth year]    [name] [name] returns today for followup.  He was seen in the emergency room by  Dr. Jens Som last week.  The patient has been having chest and  epigastric pain.  Dr. Jens Som felt that it was clearly GI in nature.  The patient has had a lot of belching and been passing gas.  He has  known coronary artery disease.  His last catheterization in July of 2007  showed a ruptured plaque in his LAD, which was stented.  He had 100%  circ, which had previously been dilated, as it had left-to-left and  right-to-left collaterals.  His right coronary artery was 100% occluded  in the mid-vessel.   The patient has been doing fairly well, outside of this epigastric pain.  In talking to him, I also would agree that it seems to be GI in nature.  He has had some relief with Prilosec and Protonix.  He was complaining  of the cost of the Protonix.   His coronary risk factors include hypertension, hyperlipidemia and type  2 diabetes.  He is a previous smoker.   From a vascular standpoint, he also has a history of bilateral carotid  bruits.   His LV function is normal by recent echo, January 03, 2007; it was 55-60%.   His last carotid duplex was in April of 2008, as well, which showed that  his bruits are likely related to ECA stenoses, as he had 0-39% bilateral  ICA stenoses.   He is feeling improved.   I spoke to Lealon at length and told him that I do not think he needed a  heart catheterization at this time.   CURRENT MEDICATIONS INCLUDE:  1. Protonix 20 a day.  2. Zocor 40 a day.  3. Hyzaar 100/12.5.  4. Plavix 75 a day.  5. An aspirin a day.  6. Toprol 100 a day.  7. Glipizide 2.5/500 b.i.d.   EXAM:  Remarkable for a healthy-appearing, middle-aged, white male in  no  distress.  Affect is appropriate.  Weight is 209, respiratory rate is 14, blood pressure is 130/70, pulse  57 and regular, he is afebrile.  He has bilateral carotid bruits.  The JVP is normal.  There is no  lymphadenopathy, no thyromegaly, no tenderness.  The neck is supple.  LUNGS:  Clear, good diaphragmatic motion.  There is no wheezing.  There is an S1, S2 with normal heart sounds.  The PMI is normal.  ABDOMEN:  Benign.  There is no epigastric pain now, no AAA and no  tenderness, no hepatosplenomegaly or hepatojugular reflux, no renal  bruits.  Distal pulses are intact, no edema.  As usual, Mccartney is not wearing any  socks.  His PTs are +3 bilaterally.  NEUROLOGIC:  Nonfocal.  SKIN:  Warm and dry.  There is no muscular weakness.  EKG:  Sinus bradycardia and otherwise normal.   IMPRESSION:  1. Epigastric pain, likely related to GERD.  Continue with H2      blockers.  I gave him a generic script for 90 tablets of Nexium to      be gotten at [facility] and, if his symptoms worsen, he will be      referred to GI.  2. History of coronary artery disease, severe.  Extends to the LAD      with occluded right and circ, which are collateralized.  Very low      threshold.  To follow up with catheterization, continue risk factor      modification, continue aspirin and beta blocker therapy.  3. History of carotid bruits, likely related to ECA stenosis.      Followup duplex in two years.  4. Hypertension, currently under good control.  Continue current      medications, including ACE inhibitors, which will help his diabetes      in regards to kidney function.  5. Diabetes.  Follow up with primary M.D.  Hemoglobin A1c on a      quarterly basis.  6. Previous remote tobacco abuse.  Continued abstinence.  I      congratulated him on this.   He has nitroglycerin at home if he were to get more typical symptoms of  angina.  I will see him back in three months.     Noralyn Pick. Eden Emms, MD, [facility]   Electronically Signed    PCN/MedQ  DD: 04/19/2007  DT: 04/20/2007  Job #: [id number]

## 2011-02-06 NOTE — Cardiovascular Report (Signed)
NAMEMITCH, ARQUETTE NO.:  [id number]   MEDICAL RECORD NO.:  [id number]          PATIENT TYPE:  INP   LOCATION:  2915                         FACILITY:  [facility]   PHYSICIAN:  Micheline Chapman, MD   DATE OF BIRTH:  05-15-1941   DATE OF PROCEDURE:  04/19/2006  DATE OF DISCHARGE:                              CARDIAC CATHETERIZATION   The procedure was performed by myself.  The proctoring physician was Dr.  Shawnie Pons.   INDICATIONS:  This is a 70 year old man with documented coronary artery  disease who presented with unstable crescendo and on diagnostic  catheterization was found to have a new high-grade proximal LAD lesion that  appeared hazy angiographically and was likely his culprit lesion.  The  remainder of his coronary anatomy remained unchanged from his most recent  prior catheterization.  We reviewed the findings with his primary  cardiologist, Maurine Cane, who agreed with percutaneous revascularization.   STUDY CONDITIONS:  Urgent, ad-hoc PCI.   PROCEDURE:  The patient was pre loaded with 600 mg of Plavix just prior to  angioplasty.  He was given 50 units per kg of heparin and an eptifibatide  double bolus and drip were started.  An XB LAD 6-French 3.5 cm guide was  used.  The lesion was crossed with a [place]rowater wire.  It was then pre dilated  with a 2.5 x 8 mm balloon and was stented with a 2.75 x 16 mm Taxus stent  taken to 14 atmospheres.  Following stent implantation a 3.25 x 12 mm  noncompliant post dilatation balloon was used and was taken to 14  atmospheres.  The 95% stenosis resolved and there was 0% residual stenosis.   COMPLICATIONS:  None.  The right femoral arterial sheath will be removed  when the ACT is less than 150 and manual pressure will be used for  hemostasis.   Recommend minimum of 12 months of uninterrupted clopidogrel therapy in  addition to daily aspirin.      Micheline Chapman, MD  Electronically Signed     MDC/MEDQ   D:  04/19/2006  T:  04/20/2006  Job:  [id number]   cc:   Arturo Morton. Riley Kill, M.D. [facility]

## 2011-02-06 NOTE — Op Note (Signed)
   NAME:  [name] [name], [name] [name]                        ACCOUNT NO.:  [id number]   MEDICAL RECORD NO.:  [id number]                   PATIENT TYPE:  AMB   LOCATION:  ENDO                                 [facility]ACILITY:  [facility]   PHYSICIAN:  Georgiana Spinner, M.D.                 DATE OF BIRTH:  [birth year]   DATE OF PROCEDURE:  06/12/2002  DATE OF DISCHARGE:                                 OPERATIVE REPORT   PROCEDURE:  Upper endoscopy.   ENDOSCOPIST:  Georgiana Spinner, M.D.   INDICATIONS:  GERD.   ANESTHESIA:  Demerol 50 mg, Versed 5 mg.   DESCRIPTION OF PROCEDURE:  With the patient mildly sedated in the left  lateral decubitus position, the Olympus videoscopic endoscope was inserted  into the mouth, passed under direct vision through the esophagus which  appeared normal into the stomach. The fundus, body, antrum, duodenal bulb,  second portion of the duodenum wall appeared normal.  From this point, the  endoscope was slowly withdrawn taking  circumferential views of the entire  duodenal mucosa until the endoscope was pulled back into the stomach, placed  in retroflexion to view the stomach from below.  The endoscope was then  straightened and withdrawn taking circumferential views of the remaining  gastric and esophageal mucosa.  The patient's vital signs and pulse oximeter  remained stable.  The patient tolerated the procedure well without apparent  complications.   FINDINGS:  Unremarkable examination.   PLAN:  Proceed with colonoscopy.                                                Georgiana Spinner, M.D.    GMO/MEDQ  D:  06/12/2002  T:  06/12/2002  Job:  [id number]

## 2011-02-06 NOTE — Op Note (Signed)
   NAME:  [name] [name], [name] [name]                        ACCOUNT NO.:  [id number]   MEDICAL RECORD NO.:  [id number]                   PATIENT TYPE:  AMB   LOCATION:  ENDO                                 [facility]ACILITY:  [facility]   PHYSICIAN:  Georgiana Spinner, M.D.                 DATE OF BIRTH:  [birth year]   DATE OF PROCEDURE:  DATE OF DISCHARGE:                                 OPERATIVE REPORT   PROCEDURE:  Colonoscopy with biopsy.   INDICATIONS FOR PROCEDURE:  Colon polyp.   ANESTHESIA:  Demerol 20, Versed 3 mg.   DESCRIPTION OF PROCEDURE:  With the patient mildly sedated in the left  lateral decubitus position, a rectal exam was performed which was  unremarkable. Subsequently, the Olympus videoscopic colonoscope was inserted  in the rectum and passed under direct vision to the cecum identified by the  ileocecal valve and appendiceal orifice. Once the cecum was cleared, the  colonoscope was slowly withdrawn taking circumferential views of the entire  colonic mucosa stopping in the sigmoid colon where a small polyp was seen,  photographed and removed using hot biopsy forceps technique on a setting of  20/20 blended current.  The endoscope was then withdrawn all the way to the  rectum which appeared normal on direct and retroflexed view. The endoscope  was straightened and withdrawn. The patient's vital signs and pulse oximeter  remained stable. The patient tolerated the procedure well without apparent  complications.   FINDINGS:  Small polyp as noted above and diverticulosis of sigmoid colon,  otherwise, unremarkable exam.   PLAN:  Repeat examination in three years.                                                 Georgiana Spinner, M.D.    GMO/MEDQ  D:  06/12/2002  T:  06/12/2002  Job:  [id number]

## 2011-02-06 NOTE — Discharge Summary (Signed)
NAME:  [name] [name], [name] [name]                        ACCOUNT NO.:  [id number]   MEDICAL RECORD NO.:  [id number]                   PATIENT TYPE:  INP   LOCATION:  2033                                 FACILITY:  [facility]   PHYSICIAN:  [place] Bing, M.D.               DATE OF BIRTH:  [birth year]   DATE OF ADMISSION:  02/27/2004  DATE OF DISCHARGE:  02/29/2004                           DISCHARGE SUMMARY - REFERRING   HISTORY OF PRESENT ILLNESS:  This is a pleasant 70 year old male who has a  previous history of coronary artery disease.  He was admitted to [facility].  [facility] on February 27, 2004, after he awoke with chest pain which  radiated to his neck and arms.  He developed supraventricular tachycardia  with rates in the 170 range.  He was treated in the emergency room with a  Diltiazem bolus and his rate improved.  He was found to have elevated  cardiac enzymes.  He was admitted for further evaluation.   PAST MEDICAL HISTORY:  1. Angioplasty in 1996.  2. Repeat cardiac catheterization with no further intervention in 1998.  3. He has had routine stress tests, his last was November of 2004, it was     normal.  4. The patient was recently diagnosed with diabetes.  5. History of hypertension.  6. Dyslipidemia.  7. Elevated liver function tests related to Zocor in the past.  8. History of prostate cancer, status post seen implant in February of 2005.   ALLERGIES:  SODIUM PENTOTHAL.   MEDICATIONS PRIOR TO ADMISSION:  1. Toprol XL 50 mg daily.  2. Hyzaar 25/100.  3. Aspirin daily.  4. A cholesterol medication that the patient does not remember which one.  5. Two diabetic medications which were recently started.   SOCIAL HISTORY:  The patient lives with his wife.  He has children.  He is a  Retail banker.  He quit smoking in 1996.  He does not drink alcohol.   FAMILY HISTORY:  His father died from MI at age 9.   HOSPITAL COURSE:  As noted, this patient was admitted to  [facility] for further evaluation of chest pain with positive enzymes  and an episode of SVT which is new for this patient.   The patient was taken to the cardiac catheterization laboratory on February 27, 2004, by Dr. Gerri Spore.  He had a PTCA of the first obtuse marginal reducing  the lesion from 90% to less than 20%.  His ejection fraction was 60% at that  time.  There was mild inferior basilar hypokinesis.  Please see full  dictated report for complete details.   The patient was enrolled in a TRITON research trial and will not receive  Plavix.   An EP consult was obtained on February 28, 2004, secondary to the patient's SVT.  It was felt that medical therapy should be used first  prior to proceeding  with an ablation.  The patient was started on Cardizem and his Toprol was  increased.  If he continues to have problems, ablation may be a  consideration for this patient.   The patient does have a low HDL.  He had been on niacin in the past.  There  was a history of elevated liver function tests.  We will hold off on the  niacin until he is seen by Dr. Eden Emms in follow-up.   Arrangements were made to discharge the patient in stable and improved  condition on February 29, 2004.   LABORATORY DATA:  A CBC on the day prior to discharge revealed hemoglobin  12.9, hematocrit 38, wbc 9.1, platelets 148,000.  Chemistry profile on February 29, 2004, revealed a BUN of 8, creatinine 1.0, potassium 3.5, glucose 107.  On admission, SGOT was 65, SGPT 127, total bilirubin 1.3.  A repeat on February 29, 2004, revealed improvement, however, the SGPT was still mildly elevated  at 54.  Cardiac enzymes on February 28, 2004, revealed a troponin I of 1.18, CK  149, MB 2.8.  TSH was 1.314.  Hemoglobin A1C was 7 which is elevated.  A  lipid profile revealed cholesterol 125, triglycerides 114, HDL 34, LDL 68.   A chest x-ray showed no active cardiopulmonary disease.   DISCHARGE MEDICATIONS:  1. Cardizem  CD 120 mg daily.  2. Enteric coated aspirin 325 mg daily.  3. The TRITON study drug.  4. Toprol XL 100 mg daily.  5. Nitroglycerin p.r.n.  6. Hyzaar was discontinued for now.  7. Diabetic medications as before.  8. Cholesterol medication on hold for now secondary to elevated liver     function tests.   The patient was told to bring all medications to his next visit with Dr.  Eden Emms.  He was told to take Tylenol as needed for any pain associated with  his catheterization.  The patient was told not to lift more than 10 pounds  for at least one week.  He was to avoid any strenuous activity until seen in  follow-up by Dr. Eden Emms.  He was to delay his cataract surgery until cleared  by Dr. Eden Emms.  He was to be on a low salt, low fat, diabetic diet.  He was  told to call the office if he had any increased pain, swelling or bleeding  from his groin.  He was to see Dr. Eden Emms March 21, 2004, at 4:30.  He was  told to call for an appointment with his primary care physician.   PROBLEM LIST:  1. Non-ST elevation myocardial infarction.  2. Previous history of coronary artery disease as described above.  3. History of dyslipidemia.  4. Diabetes mellitus recently diagnosed.  5. Hypertension.  6. Obesity.  7. Remote tobacco.  8. Positive family history of coronary artery disease.  9. Elevated liver function tests being followed by his primary care     physician.  10.      Status post percutaneous transluminal coronary angioplasty of the     first obtuse marginal performed February 27, 2004, by Dr. Gerri Spore, ejection     fraction 60% at that time.  11.      Electrophysiology evaluation for supraventricular tachycardia this     admission to be treated medically initially.      Delton See, P.A. [facility]                  Bruce Bing,  M.D.    DR/MEDQ  D:  02/29/2004  T:  02/29/2004  Job:  [id number]   cc:   Linus Galas, M.D.

## 2011-02-06 NOTE — H&P (Signed)
NAME:  [name] [name], [name] [name]                        ACCOUNT NO.:  [id number]   MEDICAL RECORD NO.:  [id number]                   PATIENT TYPE:  INP   LOCATION:  2907                                 FACILITY:  [facility]   PHYSICIAN:  Rollene Rotunda, M.D.                DATE OF BIRTH:  [birth year]   DATE OF ADMISSION:  02/27/2004  DATE OF DISCHARGE:                                HISTORY & PHYSICAL   PRIMARY CARE PHYSICIAN:  Dr. Linus Galas.   REASON FOR PRESENTATION:  Chest pain.   HISTORY OF PRESENT ILLNESS:  The patient is a very pleasant 70 year old  white gentleman with a history of coronary artery disease as described  below. He was doing well yesterday until last evening. He had some  epigastric discomfort that he thought was perhaps GI. This dissipated after  he took some Pepcid AC. He woke at 2 a.m., however with epigastric  discomfort again. He took an aspirin and again it subsided after several  minutes. Finally at 5 a.m. he had recurrent chest discomfort. This was  substernal. It was pressure. It was mild to moderate. There was some  diaphoresis. There was no radiation to his neck or to his arms. It may have  been similar to previous heart pain. He presented to the emergency room.  Initially he was in sinus rhythm. However, he developed a supraventricular  tachycardia with a rate in the 170s. This came back down to a heart rate in  the 70s with normal sinus rhythm after Diltiazem bolus and drip were  started. He has ruled in with elevated cardiac enzymes. He is currently pain  free. He is denying any shortness of breath and has had no PND or orthopnea.  He did notice palpitations when his rate was in the 170s and has had none at  home.   PAST MEDICAL HISTORY:  Coronary artery disease, status post angioplasty in  1996 (I do not have the records of this). He has had a repeat cardiac  catheterization with apparently no need for intervention in 1998. He has had  stress tests  routinely (the last of these was in November 2004 and it was  normal). Diabetes, newly diagnosed, hypertension, dyslipidemia, elevated  LFTs related to Zocor in the past, prostate cancer status post seed implants  in February 2005.   ALLERGIES:  SODIUM PENTOTHAL.   MEDICATIONS:  1. Toprol XL 50 mg daily.  2. Hyzaar 25/100 daily.  3. Aspirin daily.  4. The patient also takes cholesterol medication, does not know what it is.  5. The patient also takes two diabetes medications, but does not know what     these are.   SOCIAL HISTORY:  The patient lives with his wife. He has children. 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  He does not drink alcohol.   FAMILY HISTORY:  Contributory of his father dying of  a myocardial infarction  at age 58.   REVIEW OF SYSTEMS:  Positive for recent cough productive of clear sputum  times two months. Otherwise negative for all other systems.   PHYSICAL EXAMINATION:  GENERAL: The patient is in no distress.  VITAL SIGNS: Blood pressure 108/60, heart rate in the 70s and regular,  afebrile.  HEENT: Eyes unremarkable. Pupils equal, round, and reactive to light.  Fundi  within normal limits. Oral mucosa unremarkable.  NECK: No jugular venous distention. Wave form within normal limits. Carotid  upstrokes brisk and symmetrical. No bruits or thyromegaly.  LYMPHATICS:  No cervical, axillary, or inguinal lymphadenopathy.  LUNGS: Clear to auscultation bilaterally.  BACK: No costovertebral angle tenderness.  CHEST: Unremarkable.  HEART: PMI not displaced or sustained. S1 and S2 within normal limits. No  S3, S4, or murmurs.  ABDOMEN: Mildly obese, positive bowel sounds, normal frequency. No bruits,  rebound, guarding, or midline pulsatile masses. No hepatomegaly or  splenomegaly.  SKIN: No masses. No nodules.  EXTREMITIES: 2+ upper pulses, 2+ femorals without bruits, 1+ popliteals  bilaterally, absent dorsalis pedis and posterior tibialis  bilaterally, mild  anterior tibial hyperpigmentation bilaterally. No cyanosis or clubbing.  NEUROLOGIC: Oriented to person, place, and time. Cranial nerves II-XII  grossly intact. Motor grossly intact.   EKG reveals sinus rhythm, axis within normal limits, intervals within normal  limits, and no acute ST-T wave changes.  Chest x-ray reveals no acute disease.   LABORATORY DATA:  WBC 14.4, hemoglobin 14.9, sodium 135, potassium 4, BUN  24, creatinine 1.2, INR 0.9. AST 65, ALT 127. CK 133, MB 19.8, index 14.9,  troponin 2.42.   ASSESSMENT/PLAN:  1. Chest pain. The patient had chest discomfort and has initial elevated     enzymes. He has known coronary disease. He will be treated with heparin,     IIb/IIIa inhibitor, aspirin, and beta blockers. He will have cardiac     catheterization and treatment as needed.  2. Supraventricular tachycardia. This is a slight irregular to this.     However, I think it is re-entrant type of arrhythmia rather than atrial     fibrillation. He will continue on Diltiazem for now. I have reviewed the     EKG with Dr. Ladona Ridgel today.  He may able to just maximize his AV nodal     blocking agents for management.  3. Elevated liver enzymes. Will follow these. Will need to look at his     medications to see if these could be a culprit. Will most likely defer     further evaluation to his primary care physician.  4. Dyslipidemia. Will check a lipid profile.  5. Diabetes. Will need to review his outpatient regimen.                                                Rollene Rotunda, M.D.    Derinda Sis  D:  02/27/2004  T:  02/27/2004  Job:  [id number]

## 2011-02-06 NOTE — Cardiovascular Report (Signed)
NAME:  [name] [name], [name] [name] NO.:  [id number]   MEDICAL RECORD NO.:  [id number]            PATIENT TYPE:   LOCATION:                                 FACILITY:   PHYSICIAN:  Jonelle Sidle, M.D. LHCDATE OF BIRTH:   DATE OF PROCEDURE:  DATE OF DISCHARGE:                              CARDIAC CATHETERIZATION   PRIMARY CARE PHYSICIAN:  Dr. Birdena Jubilee, M.D.   INDICATIONS:  [name] [name] is a 70 year old male with a history of type 2  diabetes mellitus, hypertension and coronary artery disease status post  angioplasties to the first obtuse marginal branch beginning back in 1996.  He has residual coronary artery disease including ostial diagonal disease,  and occluded mid circumflex that fills via right-to-left collaterals and  left-to-left collaterals, and an occluded nondominant right coronary artery.  He has been managed medically and has done well in terms of symptom control  until recently.  He presented with symptoms concerning for unstable angina  and is referred to the cardiac catheterization lab for repeat diagnostic  coronary angiography to assess for revascularization options.  The potential  risks and benefits were explained to him in advance and informed consent was  obtained.   PROCEDURE PERFORMED:  1.  Left heart catheterization  2.  Selective coronary angiography.  3.  Left ventriculography.   ACCESS AND EQUIPMENT:  The area about the right femoral artery was  anesthetized 1% lidocaine and a 6-French  sheath was placed in the right  femoral artery via the modified Seldinger technique.  There was a fairly  significant amount of scar tissue noted in this area.  Of note, prior to  initiation of the procedure, the patient described fairly sudden onset chest  pain with transient ST-segment depression noted on telemetry monitoring.  He  was treated with intravenous nitroglycerin as well as intravenous beta  blocker therapy with resolution of symptoms.   Angiography was subsequently  performed using standard preformed 6-French JL-4 and JR-4 catheters.  An  angled pigtail catheter was used for left heart catheterization, left  ventriculography.  A no-torque right catheter was also used for selective  right coronary angiography.  All exchanges were made over wire.  Total of  130 mL Omnipaque were used.  There were no immediate complications.   HEMODYNAMICS:  Aorta 128/60 mmHg.  Left ventricle 127/7 mmHg.   ANGIOGRAPHIC FINDINGS:  1.  The left main coronary artery has an area of calcification distally.      This vessel gives rise to the left anterior descending and circumflex      coronary arteries.  There are mild luminal irregularities including      approximately 20% stenosis within the left main.  2.  The left anterior descending is a medium caliber vessel providing      essentially one large diagonal branch.  Within the proximal portion of      the left anterior descending is a hazy appearing 90% stenosis that      likely represents ruptured plaque.  Flow is TIMI III in the vessel.  There is approximately 75% stenosis at the ostium of the diagonal branch      followed by an area of 70% stenosis.  There is an area of 60% stenosis      in the left anterior descending distal to the takeoff of the diagonal      branch as well as a 50% stenosis in the distal left anterior descending.  3.  The circumflex coronary artery is occluded in its mid vessel segment      just after the takeoff of a large obtuse marginal branch that      bifurcates.  This branch is the site of previous angioplasty, and within      that region there is approximately 30% stenosis diffusely noted.  Prior      to the occlusion of the circumflex is a 30% area stenosis.  The distal      circumflex provides a posterior descending branch and posterolateral      branch, and this is seen to fill subtly via left-to-left collaterals as      well as more predominately by  right-to-left collaterals from a right      ventricular marginal branch of the right coronary artery.  4.  The right coronary artery is small and nondominant with approximately      60% ostial stenosis that did not change significantly with intracoronary      nitroglycerin.  As stated above this vessel was known to be occluded in      its midportion and there are well-developed right-to-left collaterals to      the circumflex system.   LEFT VENTRICULOGRAPHY:  Performed in the RAO projection and reveals an  ejection fraction approximately 55%  with no focal anterior or inferior wall  motion abnormality and no significant mitral regurgitation.   DIAGNOSES:  1.  Multivessel coronary disease as outlined above.  In comparison to prior      films from late October 2006, the coronary anatomy is relatively stable      with the exception of the 90% proximal left anterior descending lesion,      which is suggestive of a ruptured plaque based on its hazy appearance.  2.  Left ventricular ejection fraction approximately 55% with no wall motion      abnormalities, no mitral regurgitation, and left ventricular end-      diastolic pressure of 7 mmHg.   DISCUSSION:  I reviewed the results with the patient and also reviewed the  films with Dr. Riley Kill and Dr. Excell Seltzer.  We reviewed the situation with the  patient's primary cardiologist Dr. Eden Emms.  After reviewing the options, the  plan at this point will be to proceed with percutaneous intervention to left  anterior descending and subsequently medical therapy.      Jonelle Sidle, M.D. [facility]  Electronically Signed     SGM/MEDQ  D:  04/19/2006  T:  04/20/2006  Job:  [id number]   cc:   Birdena Jubilee, MD  Charlton Haws, M.D.

## 2011-02-06 NOTE — Assessment & Plan Note (Signed)
[facility]                              CARDIOLOGY OFFICE NOTE   NAME:[name] [name], [name] [name]                          MRN:          [id number]  DATE:05/21/2006                            DOB:          1941/06/05    [name] [name] is seen today in follow up.  He is doing fairly well in regards to  his risk factor modification. He has shown me some blood work that was done.  His LDL cholesterol was 57 and his hemoglobin A1C was 6.   He is not having any significant chest pain.   The patient has a distant history of circumflex myocardial infarction and  angioplasty.   The patient subsequently was found to have an occluded right coronary artery  with collaterals as well as an occluded distal circumflex with  collateralization.   He has moderate LAD disease.   He is not having significant chest pain.   For the time being he will continue his aspirin and Plavix.   He is active and not having angina.   His primary care doctor has suggested changing his Toprol to Coreg in  regards to his diabetes; however, given the fact that his indices are all  excellent and the Toprol is doing fine, I see no reason to change him right  now.   I do agree that his Hyzaar should be increased to 100/25 for better blood  pressure control.   PHYSICAL EXAMINATION:  VITAL SIGNS:  The pulse is 68, blood pressure is  150/80.  LUNGS:  Clear.  CARDIAC:  Normal heart sounds.  ABDOMEN:  Is fine.  LOWER EXTREMITIES:  Distal pulses, no edema.   IMPRESSION:  Stable coronary disease, occluded and collateralized distal  right circumflex. No angina. Continue current medical therapy, increase  Hyzaar for blood pressure control. Follow with primary doctor in regards to  diabetes. Continue aspirin and Plavix.                              Noralyn Pick. Eden Emms, MD, [facility]    PCN/MedQ  DD:  05/21/2006  DT:  05/21/2006  Job #:  [id number]

## 2011-02-06 NOTE — Discharge Summary (Signed)
NAMEBRADEY, LUZIER NO.:  [id number]   MEDICAL RECORD NO.:  [id number]          PATIENT TYPE:  INP   LOCATION:  2915                         FACILITY:  [facility]   PHYSICIAN:  Charlton Haws, M.D.     DATE OF BIRTH:  1941/05/04   DATE OF ADMISSION:  04/19/2006  DATE OF DISCHARGE:  04/21/2006                           DISCHARGE SUMMARY - REFERRING   HISTORY:  Mr. Lawhorn is a 70 year old white male who on the evening prior to  admission developed substernal chest pressure.  He initially attributed  it  to indigestion from eating a cheeseburger; however, the discomfort  progressed into the following morning and began radiating into his left  shoulder. He took a sublingual nitroglycerin with relief of his discomfort.   His history is notable for diabetes, hypertension, prior angioplasty of an  OM1 in 1996, remote tobacco use, right carotid bruit.   LABORATORY:  Chest x-ray showed some emphysema, no acute findings. Admission  H&H 13.9 and 40.2, normal indices, platelets 190, WBC 8.9, PTT greater than  200, PT 13.7.  Subsequent H&H on the 31st was 12.1 and 35.3, normal indices,  platelets 154, WBC is 9.6. Sodium was 137, potassium 4.4, BUN 18, creatinine  0.8, glucose 105, normal LFTs except for ALT slightly elevated at 43.  Postprocedure sodium was 136, potassium 2.6, BUN 14, creatinine 0.9.  CK-MBs  were negative x2.  Troponins were 0.06 and 0.13.   EKG showed sinus bradycardia, normal sinus rhythm, normal axis, ventricular  rate 50s and 60s, early R wave progression.   HOSPITAL COURSE:  Mr. Pidcock was admitted to [facility] by Dr. Eden Emms  and he was placed on IV heparin.  Cardiac catheterization on April 19, 2006  showed multivessel coronary artery disease.  Please refer to dictation.  EF  of 55% without wall motion abnormalities. The procedure was performed by Dr.  Diona Browner. Dr. Diona Browner, after review, felt that despite multivessel coronary  artery disease, his  anatomy was relatively stable with the exception of the  90% proximal LAD lesion which was compatible with plaque rupture. Films were  reviewed and discussed with Dr. Riley Kill and Dr. Excell Seltzer. Intervention of the  LAD was planned. Dr. Eden Emms was made aware of this situation.  Dr. Excell Seltzer  performed Taxus stenting to the proximal LAD reducing this lesion from 95%  to 0%. Post sheath removal and bedrest, the patient was ambulating without  difficulty.  Catheterization site was intact without hematoma or ecchymosis.  Cardiac rehab assisted with ambulation and education. By April 21, 2006, Dr.  Eden Emms felt that the patient could be discharged home.   DISCHARGE DIAGNOSES:  1.  Acute coronary syndrome, status post drug-eluting stent to the proximal      LAD as described.  2.  Hyperglycemia with a history of diabetes.  3.  Hyperlipidemia.  4.  History of hypertension.  5.  Remote tobacco use.  6.  Right carotid bruit.   DISPOSITION:  Mr. Schauer is discharged home.   NEW MEDICATIONS:  1.  Plavix 75 mg daily for at least 1  year.  2.  Nitroglycerin 0.4 as needed.   He was asked to continue:  1.  Aspirin 325 mg daily.  2.  Zocor 40 mg q.h.s.  3.  Resume his Glucophage 500 mg b.i.d. on April 22, 2006  p.m.  4.  Continue his glipizide 2.5 mg b.i.d.  5.  Toprol XL 100 mg daily.  6.  Hyzaar 25/12.5 daily.  7.  He was asked to discontinue his Cardizem 240 mg daily.   He will follow up with Dr. Eden Emms on May 21, 2006 at 11:00 a.m. He was  advised he may return to work on Monday April 26, 2006,  maintain low salt,  fat, cholesterol ADA diet.  Activities and wound care per supplemental  discharge sheet.  He was asked to bring all his medications to all  appointments.  Follow up with his primary care physician as needed.  Discharge time less than 30 minutes.      Joellyn Rued, P.A. [facility]    ______________________________  Charlton Haws, M.D.    EW/MEDQ  D:  04/21/2006  T:  04/21/2006  Job:   [id number]   cc:   Birdena Jubilee, MD

## 2011-02-06 NOTE — H&P (Signed)
NAMEELIAZ, FOUT NO.:  [id number]   MEDICAL RECORD NO.:  [id number]          PATIENT TYPE:  INP   LOCATION:  1824                         FACILITY:  [facility]   PHYSICIAN:  Charlton Haws, M.D.     DATE OF BIRTH:  March 03, 1941   DATE OF ADMISSION:  04/19/2006  DATE OF DISCHARGE:                                HISTORY & PHYSICAL   HISTORY OF PRESENT ILLNESS:  Mr. Rinker is a 70 year old patient that I have  followed many years.   He has a distant history of angioplasty of OM-1 branch in 1996.  He had some  diffuse disease in the distal LAD.  He is left dominant.  The patient has  been doing fairly well.  I do not have his office records.  He has been  compliant with his medications.  He does not smoke.   Last night, he had substernal chest pressure.  He thought initially it was  indigestion from eating a cheeseburger.  However, it progressed this morning  and he came to the ER with increasing substernal chest pain radiating up  towards the left shoulder.  It was relieved with nitroglycerin and he is  currently pain free and point of care markers are negative.   Coronary care risk factors including positive family history of hypertension  and non-insulin dependent diabetes mellitus.  He does have a list of his  medications, but says that he has cut back on his diabetes medicine.  His  primary care doctor is Dr. Alberteen Sam in [facility], but they may be interested  in switching to one of the [facility] primary care doctors.   They live in [place] and it may be reasonable also to have him followup  with the [facility] primary care doctors there.   REVIEW OF SYSTEMS:  Otherwise remarkable for the lack of nausea or vomiting.  He actually felt that he was going to have diarrhea, but has not had any  significant abdominal pain.   PAST MEDICAL HISTORY:  1.  Diabetes.  2.  Hypertension.  3.  Previous angioplasty of circumflex   SOCIAL HISTORY:  The patient loves to travel.   He sells rope for a living.  He does not smoke.  He is happily married and his wife is with him.  The  patient was actually suppose to go on a business trip to [place] tomorrow.   PHYSICAL EXAMINATION:  VITAL SIGNS:  Blood pressure 120/70, pulse 53 and  regular.  LUNGS:  Clear.  CARDIAC:  There is a faint right carotid bruit.  Normal S1, S2.  ABDOMEN:  Benign.  EXTREMITIES:  No edema.   LABORATORY DATA AND X-RAY FINDINGS:  EKG shows sinus bradycardia without  acute changes.  Chest x-ray shows no active disease and no cardiomegaly.  Point of care enzymes are negative.   IMPRESSION:  The patient has substernal chest pain relieved with  nitroglycerin.  He actually had some diffuse disease in his coronaries in  1996, with previous obtuse marginal angioplasty.  Given the fact that he is  an ongoing  diabetic, I think it is reasonable to proceed with heart  catheterization this afternoon.   PLAN:  We will put him on a 2000 calorie, ADA diet.  He will be maintained  on aspirin, heparin and nitroglycerin.  Will continue his beta-blocker.  I  do not think that he needs Cardizem.  We will try to get a medication list  from the office.  Will check a hemoglobin A1c while he is here in the  hospital to assess his glycemic control.   Further recommendations will be based on results of his heart  catheterization.           ______________________________  Charlton Haws, M.D.     PN/MEDQ  D:  04/19/2006  T:  04/19/2006  Job:  [id number]

## 2011-02-06 NOTE — Cardiovascular Report (Signed)
[name], [name] [name] NO.:  [id number]   MEDICAL RECORD NO.:  [id number]          PATIENT TYPE:  OIB   LOCATION:  1961                         FACILITY:  [facility]   PHYSICIAN:  Charlton Haws, M.D.     DATE OF BIRTH:  [birth year]   DATE OF PROCEDURE:  07/13/2005  DATE OF DISCHARGE:                              CARDIAC CATHETERIZATION   HISTORY OF PRESENT ILLNESS:  [name] is being re-catheterized in the [facility] lab  for chest pain and an abnormal Myoview. He is status post previous  angioplasty of an OM branch. Cine catheterization was done with a 4-French  catheter from the right femoral artery.   RESULTS:  1.  Left main coronary artery has a 20% discrete stenosis.  2.  Left anterior descending artery had 20-30% multiple discrete lesions in      the proximal and mid vessel.  3.  The patient's diagonal branches were small. There was a 60-70% ostial      lesion in the first diagonal branch and a 70% ostial lesion in the      second diagonal branch.  4.  Circumflex coronary artery had 30% multiple discrete lesions proximally.  5.  The first obtuse marginal branch was a large vessel with 40-50% mid      vessel disease. It appeared that the distal circumflex was subtotally      occluded with bridging collaterals.  However, it was somewhat difficult      to know if this was supplying the distal circumflex or left-to-right      collaterals to the RCA.  6.  The right coronary artery was dominant. There was 30% multiple lesions      proximally. The mid vessel was100%occluded. There were both right to      right and left to right collaterals. The patient's right to right      collaterals appeared to be through a large atrial system which filled      the distal RCA in a late fashion.  7.  RAO ventriculography: RAO ventriculography showed normal wall motion. EF      was 55-60%. There was normal gradient across the aortic valve and no MR.      LV pressure was 153/16. Aortic pressure  is 153/67.   IMPRESSION:  [name] Myoview showed minimal peri-infarct ischemia. He has  well-collateralized distal right and circumflex.  He is a diabetic and I  think for the time being, medical therapy is warranted.  His diagonal  branches are small and the ostial lesion should be stable.   If his chest pain syndrome were to change, we certainly could consider  angioplasty of a chronic total right.  He did not have any ischemia in the  diagonal territory.   He tolerated procedure well. His risk factor modification is good.  His  diabetes is under reasonable control. His LFTs are mildly elevated on Zocor  and Niacin and we will have to follow these.   We will see him back in the office in a month's time.  ______________________________  Charlton Haws, M.D.     PN/MEDQ  D:  07/13/2005  T:  07/13/2005  Job:  [id number]

## 2011-02-06 NOTE — Assessment & Plan Note (Signed)
[facility]                            CARDIOLOGY OFFICE NOTE   NAME:[name], [name]                          MRN:          [id number]  DATE:12/03/2006                            DOB:          1941-01-25    [name] [name] returns today for followup.  He has a history of a distal circ  and RCA disease that has collateralized with moderate LAD disease.  He  is not having any significant chest pain.  He is a diabetic.  His last  Myoview was in 2006, which was a low risk.   We tend to follow the patient clinically.  His Myoview's are always  abnormal due to his collateralization.  His risk factors are well  modified.  His last LDL cholesterol was under 80.   The patient needs his prescriptions rewritten for three months at a  time.   REVIEW OF SYSTEMS:  Remarkable for some indigestion.   MEDICATIONS:  1. He cannot afford his Protonix but does occasionally take over-the-      counter Tums.  2. Zocor 80 a day.  3. Hyzaar 100/25.  4. Plavix 75 a day.  5. Toprol 100 a day.  6. Glipizide/Metformin 2.5/500.  7. An aspirin a day.   PHYSICAL EXAMINATION:  GENERAL:  He looks well.  VITAL SIGNS:  Blood pressure is 130/80.  Pulse is 70 and regular.  HEENT:  Normal.  NECK:  He has bilateral carotid bruits.  LUNGS:  Clear.  HEART:  He has an S1 and S2 with a systolic ejection murmur of aortic  sclerosis.  ABDOMEN:  Benign.  LOWER EXTREMITIES:  Intact pulses, no edema.   IMPRESSION:  1. Coronary disease, two-vessel, distal collateralization at the right      coronary artery and circumflex, moderate left anterior descending      disease.  A followup Myoview in 2009.  Continue current medical      therapy.  2. Hypercholesterolemia.  LDL at target on Zocor 40.   Follow up primary care MD for hemoglobin A1c check.   The patient will continue aspirin and Plavix even though he does not  have a stent in due to his collateralized disease and small diabetic   vessels.   His blood pressure is being well-controlled on Hyzaar.   He will have a followup carotid duplex for his bilateral bruits.  He  will have a two-D echocardiogram to assess his aortic valve.   I will see him back in six months so long as he does not have high-grade  disease on carotid ultrasound and echo.     Noralyn Pick. Eden Emms, MD, [facility]  Electronically Signed    PCN/MedQ  DD: 12/03/2006  DT: 12/04/2006  Job #: [id number]

## 2011-02-06 NOTE — Cardiovascular Report (Signed)
NAME:  [name] [name], [name] [name]                        ACCOUNT NO.:  [id number]   MEDICAL RECORD NO.:  [id number]                   PATIENT TYPE:  INP   LOCATION:  2907                                 FACILITY:  [facility]   PHYSICIAN:  Carole Binning, M.D. [facility]         DATE OF BIRTH:  [birth year]   DATE OF PROCEDURE:  02/27/2004  DATE OF DISCHARGE:                              CARDIAC CATHETERIZATION   PROCEDURE PERFORMED:  1. Left heart catheterization with coronary angiography and left     ventriculography.  2. Percutaneous transluminal coronary angioplasty of the first obtuse     marginal branch.   INDICATION:  [name] [name] is a 70 year old male with history of coronary artery  disease and prior percutaneous coronary intervention.  He presented to the  emergency room this morning with substernal chest pain.  He was found to be  in supraventricular tachycardia.  He had significant ST segment depression  in the inferior and anterolateral leads.  After he converted to sinus  rhythm, his chest pain resolved but he had mild residual ST segment  depression on his EKG.  Cardiac markers were elevated consistent with a non-  Q-wave myocardial infarction.  He was stabilized on medical therapy  including heparin and integrilin.  He was then referred for cardiac  catheterization.   CATHETERIZATION PROCEDURAL NOTE:  A 6 French sheath was placed in the right  femoral artery.  Coronary angiography was performed with standard Judkins 6  French catheters.  Left ventriculography was performed with an angled  pigtail catheter.  Contrast was Omnipaque.  There were no complications.   CATHETERIZATION RESULTS:   HEMODYNAMICS:  1. Left ventricular pressure 144/22.  2. Aortic pressure 140/60.  3. There is no aortic valve gradient.   LEFT VENTRICULOGRAM:  There is mild hypokinesis of the inferior basal wall.  Otherwise, wall motion is normal.  Ejection fraction estimated at 60%.  There is no mitral  regurgitation.   CORONARY ARTERIOGRAPHY (LEFT DOMINANT):  Left main is normal.   Left anterior descending artery has moderate diffuse plaque with 30%  stenosis in the proximal vessel, 20% in the mid vessel and a long 60-70%  stenosis in the distal vessel in an area of muscle bridging.  The LAD gives  rise to a small first diagonal branch which has a 90% stenosis at its  ostium.  There is a large second diagonal branch with a 30% stenosis  proximally.   Left circumflex is a dominant vessel.  There is a 30% stenosis in the  proximal mid circumflex.  Further down in the mid circumflex is 100%  occluded just beyond a large first obtuse marginal branch.  The distal  circumflex consisting of small second marginal and posterior lateral and  posterior descending artery fill via left-to-left collaterals.  This appears  to be a chronic occlusion.  There are bridging collaterals from the more  proximal circumflex as well as collaterals from the apical  LAD filling the  distal vessel.  The first obtuse marginal branch itself is long vessel, but  relatively small caliber.  There is a 90% stenosis in the proximal portion  of this followed by 30% stenosis in the mid portion of the first obtuse  marginal branch.   The right coronary artery is a small nondominant vessel.  There is a 75%  stenosis in the ostium of the right coronary artery and a 30% stenosis in  the mid vessel.   IMPRESSION:  1. Preserved left ventricular systolic function.  2. One-vessel coronary artery disease characterized by a chronic total     occlusion of the mid left circumflex just after the first obtuse marginal     branch.  There is a 90% stenosis in the first obtuse marginal branch.     There is moderate disease in the left anterior descending artery system.   PLAN:  Percutaneous intervention of the first obtuse marginal.  See below.   PERCUTANEOUS TRANSLUMINAL CORONARY ANGIOPLASTY PROCEDURAL NOTE:  Following  completion  of the diagnostic catheterization, we proceeded with percutaneous  coronary intervention.  We utilized the pre-existing 6 French sheath in the  right femoral artery.  The patient had been started on heparin and  integrilin prior to coming to the catheterization laboratory.  Additional  doses of these medicines were given per protocol.  We used a 6 [place] CLS  3.5 guiding catheter.  An [employer]ahi soft wire was advanced under fluoroscopic  guidance into the distal aspect of the first obtuse marginal branch.  We  then performed percutaneous transluminal coronary angioplasty with a 2.25 x  15-mm Quantum balloon.  We performed two inflations each to 12 atmospheres.  Intermittent doses of intracoronary nitroglycerin were administered.  Final  angiographic images were obtained revealing patency of the obtuse marginal  at the percutaneous transluminal coronary angioplasty site with less than  20% residual stenosis and TIMI-3 flow.   COMPLICATIONS:  None.   RESULTS:  Successful percutaneous transluminal coronary angioplasty of the  first obtuse marginal branch.  A 90% stenosis was reduced to 0% residual  with TIMI-3 flow.   PLAN:  Integrilin will be continued overnight.  The patient was enrolled in  the Triton study and will be treated per study protocol.                                               Carole Binning, M.D. [facility]    MWP/MEDQ  D:  02/27/2004  T:  02/28/2004  Job:  [id number]   cc:   Birdena Jubilee, MD  [address]  [place]  [place] [address]  Fax: [phone]   Charlton Haws, M.D.

## 2011-02-06 NOTE — Op Note (Signed)
NAME:  [name] [name], [name] [name]                        ACCOUNT NO.:  [id number]   MEDICAL RECORD NO.:  [id number]                   PATIENT TYPE:  AMB   LOCATION:  NESC                                 FACILITY:  [facility]   PHYSICIAN:  Sigmund I. Patsi Sears, M.D.         DATE OF BIRTH:  Jan 23, 1941   DATE OF PROCEDURE:  11/09/2003  DATE OF DISCHARGE:                                 OPERATIVE REPORT   PREOPERATIVE DIAGNOSIS:  Adenocarcinoma of the prostate.   POSTOPERATIVE DIAGNOSIS:  Adenocarcinoma of the prostate.   OPERATION:  Implantation of I-125 radioactive seeds for prostate cancer.   SURGEON:  Sigmund I. Patsi Sears, M.D.   ANESTHESIA:  General LMA.   PREPARATION:  After appropriate preanesthesia, the patient is brought to the  operating room and placed on the operating table in the dorsal supine  position where general LMA anesthesia was introduced.  He was then re-placed  in dorsal lithotomy position, where the pubis was prepped with Betadine  solution and draped in the usual fashion.   REVIEW OF HISTORY:  Mr. Mcpheeters is a 70 year old hypertensive male, with a PSA  jump from 2.5 to 4.5 with a PSA-2 of 23%.  Biopsies show a 44 mL gland, with  a Gleason 3 + 3 adenocarcinoma of the prostate in the apex, the left central  biopsies, and the left lateral biopsies.  He is now for radioactive seed  implantation (see office note, May 07, 2003).   DESCRIPTION OF PROCEDURE:  Under general anesthesia, the patient underwent  ultrasound determination, and implantation of 78 seeds of I-125.  Cystoscopy  revealed 1 strand, 3 seeds within the bladder, and this was removed with the  flexible cystoscope.  Foley catheter was placed.  The patient tolerated the  procedure well.  He was given IV Toradol, awakened and taken to recovery  room in good condition.                                               Sigmund I. Patsi Sears, M.D.    SIT/MEDQ  D:  11/09/2003  T:  11/09/2003  Job:  [id number]   cc:    Wynn Banker, M.D.  [address] [facility]  [place]  [place] [address]  Fax: [phone]

## 2011-02-06 NOTE — Assessment & Plan Note (Signed)
[facility]                              CARDIOLOGY OFFICE NOTE   NAME:[name], [name] MONKS                          MRN:          [id number]  DATE:06/14/2006                            DOB:          07-Jul-1941    [name] [name] returns today for followup.  He has had distal disease in the circ and  right coronary artery with collateralization, moderate LAD disease, not  having significant chest pain.  His blood pressure is well controlled, his  LDL cholesterol was 57.  He had some significant indigestion and heartburn,  Protonix has seemed to help this.  He is a diabetic, and his hemoglobin A1C  has been fairly well controlled.  He was talking about taking a trip to  [place] soon, I told him I think this would be fine.   PHYSICAL EXAMINATION:  His weight is stable.  The blood pressure is 140/80,  pulse is 70 and regular.  LUNGS:  Clear.  CAROTIDS:  Normal.  HEART:  There is an S1, S2 with normal heart sounds.  ABDOMEN:  Benign.  LOWER EXTREMITIES:  Intact pulses, no edema.   IMPRESSION:  Stable distal collateralized disease in the right and  circumflex, with moderate left anterior descending disease.  Continue  aspirin and Plavix.   He is tolerating his beta blocker.   His blood sugar is under reasonable control.  I do not think we need to  switch him to Coreg.   His stomach seems to be improved on Protonix.  He knows to talk to this  primary care MD if his symptoms of reflux persist despite Protonix.  From a  cardiac perspective, I think he is stable.  A lot of the times we follow  [name] [name]'s symptoms, his Myoviews tend to be abnormal due to his distal disease  with collateralization.  I will see him back in 6 months.                               Noralyn Pick. Eden Emms, MD, [facility]    PCN/MedQ  DD:  06/14/2006  DT:  06/16/2006  Job #:  [id number]

## 2011-03-30 ENCOUNTER — Encounter: Payer: Self-pay | Admitting: Family Medicine

## 2011-05-07 ENCOUNTER — Encounter: Payer: Self-pay | Admitting: Cardiovascular Disease

## 2011-05-07 ENCOUNTER — Other Ambulatory Visit: Payer: Self-pay | Admitting: Cardiovascular Disease

## 2011-05-07 DIAGNOSIS — I6529 Occlusion and stenosis of unspecified carotid artery: Secondary | ICD-10-CM

## 2011-05-08 ENCOUNTER — Encounter: Payer: Self-pay | Admitting: Cardiovascular Disease

## 2011-05-08 ENCOUNTER — Ambulatory Visit (INDEPENDENT_AMBULATORY_CARE_PROVIDER_SITE_OTHER): Payer: Medicare Other | Admitting: Cardiovascular Disease

## 2011-05-08 ENCOUNTER — Encounter (INDEPENDENT_AMBULATORY_CARE_PROVIDER_SITE_OTHER): Payer: Medicare Other | Admitting: *Deleted

## 2011-05-08 DIAGNOSIS — I10 Essential (primary) hypertension: Secondary | ICD-10-CM

## 2011-05-08 DIAGNOSIS — I2510 Atherosclerotic heart disease of native coronary artery without angina pectoris: Secondary | ICD-10-CM

## 2011-05-08 DIAGNOSIS — I6529 Occlusion and stenosis of unspecified carotid artery: Secondary | ICD-10-CM

## 2011-05-08 DIAGNOSIS — I779 Disorder of arteries and arterioles, unspecified: Secondary | ICD-10-CM

## 2011-05-08 DIAGNOSIS — E7800 Pure hypercholesterolemia, unspecified: Secondary | ICD-10-CM

## 2011-05-08 NOTE — Assessment & Plan Note (Signed)
Stable with no angina and good activity level.  Continue medical Rx  

## 2011-05-08 NOTE — Progress Notes (Signed)
  ROS: Denies fever, malais, weight loss, blurry vision, decreased visual acuity, cough, sputum, SOB, hemoptysis, pleuritic pain, palpitaitons, heartburn, abdominal pain, melena, lower extremity edema, claudication, or rash.  All other systems reviewed and negative  General: Affect appropriate Healthy:  appears stated age HEENT: normal Neck supple with no adenopathy JVP normal no bruits no thyromegaly Lungs clear with no wheezing and good diaphragmatic motion Heart:  S1/S2 SE murmur no ,rub, gallop or click PMI normal Abdomen: benighn, BS positve, no tenderness, no AAA no bruit.  No HSM or HJR Distal pulses intact with no bruits No edema Neuro non-focal Skin warm and dry No muscular weakness   Current Outpatient Prescriptions  Medication Sig Dispense Refill  . aspirin 325 MG tablet Take 325 mg by mouth daily.        . clopidogrel (PLAVIX) 75 MG tablet Take 1 tablet (75 mg total) by mouth daily.  90 tablet  4  . doxazosin (CARDURA) 2 MG tablet Take 2 mg by mouth at bedtime.        Marland Kitchen glipiZIDE (GLUCOTROL) 5 MG tablet        . losartan (COZAAR) 100 MG tablet Take 100 mg by mouth daily.        . metFORMIN (GLUCOPHAGE) 1000 MG tablet        . metoprolol (TOPROL-XL) 100 MG 24 hr tablet Take 50 mg by mouth daily.        . simvastatin (ZOCOR) 80 MG tablet Take 0.5 tablets (40 mg total) by mouth at bedtime.  45 tablet  3  . traMADol (ULTRAM) 50 MG tablet Take 50 mg by mouth 2 (two) times daily as needed.          Allergies  Review of patient's allergies indicates not on file.  Electrocardiogram:  NSR PVC rate 62 otherwise normal.  Assessment and Plan

## 2011-05-08 NOTE — Assessment & Plan Note (Signed)
Cholesterol is at goal.  Continue current dose of statin and diet Rx.  No myalgias or side effects.  F/U  LFT's in 6 months. Lab Results  Component Value Date   LDLCALC 52 10/25/2009

## 2011-05-08 NOTE — Assessment & Plan Note (Signed)
60-79% LICA 40-59% RICA stable.  Continue ASA.  F/U duplex  6 months

## 2011-05-08 NOTE — Assessment & Plan Note (Signed)
Well controlled.  Continue current medications and low sodium Dash type diet.    

## 2011-05-22 ENCOUNTER — Encounter: Payer: Self-pay | Admitting: Family Medicine

## 2011-05-29 ENCOUNTER — Encounter: Payer: Self-pay | Admitting: Family Medicine

## 2011-05-29 ENCOUNTER — Ambulatory Visit (INDEPENDENT_AMBULATORY_CARE_PROVIDER_SITE_OTHER): Payer: Medicare Other | Admitting: Family Medicine

## 2011-05-29 VITALS — BP 142/66 | HR 63 | Wt 197.0 lb

## 2011-05-29 DIAGNOSIS — E119 Type 2 diabetes mellitus without complications: Secondary | ICD-10-CM

## 2011-05-29 DIAGNOSIS — E7800 Pure hypercholesterolemia, unspecified: Secondary | ICD-10-CM

## 2011-05-29 DIAGNOSIS — E785 Hyperlipidemia, unspecified: Secondary | ICD-10-CM

## 2011-05-29 DIAGNOSIS — R809 Proteinuria, unspecified: Secondary | ICD-10-CM

## 2011-05-29 DIAGNOSIS — Z23 Encounter for immunization: Secondary | ICD-10-CM

## 2011-05-29 DIAGNOSIS — I10 Essential (primary) hypertension: Secondary | ICD-10-CM

## 2011-05-29 LAB — POCT UA - MICROALBUMIN: Creatinine, POC: 100 mg/dL

## 2011-05-29 LAB — POCT GLYCOSYLATED HEMOGLOBIN (HGB A1C): Hemoglobin A1C: 5.6

## 2011-05-29 NOTE — Assessment & Plan Note (Signed)
Lab Results  Component Value Date   HGBA1C 5.6 05/29/2011   A1C looks great.  Stop the glipizide. Congratulate him on weight loss and encouraged regular exercise. His microalbumin was positive today but he is already on an ARB. We performed his monofilament exam today.

## 2011-05-29 NOTE — Assessment & Plan Note (Signed)
Due to recheck levels today. Lab slip given.

## 2011-05-29 NOTE — Progress Notes (Signed)
  Subjective:    Patient ID: [name] [name], male    DOB: Oct 18, 1940, 70 y.o.   MRN: [id number]  Diabetes He presents for his follow-up diabetic visit. He has type 2 diabetes mellitus. His disease course has been stable. There are no hypoglycemic associated symptoms. Pertinent negatives for diabetes include no chest pain, no polydipsia and no polyuria. Symptoms are stable. Risk factors for coronary artery disease include dyslipidemia, hypertension and male sex. Current diabetic treatment includes oral agent (dual therapy). He rarely participates in exercise. An ACE inhibitor/angiotensin II receptor blocker is being taken.    He recently saw his cardiologist and is dong well. He does need up to date labwork.   Review of Systems  Cardiovascular: Negative for chest pain.  Genitourinary: Negative for polyuria.  Hematological: Negative for polydipsia.       Objective:   Physical Exam  Constitutional: He is oriented to person, place, and time. He appears well-developed and well-nourished.  HENT:  Head: Normocephalic and atraumatic.  Neck: Neck supple. No thyromegaly present.  Cardiovascular: Normal rate, regular rhythm and normal heart sounds.        No carotid bruits  Pulmonary/Chest: Effort normal and breath sounds normal.  Lymphadenopathy:    He has no cervical adenopathy.  Neurological: He is alert and oriented to person, place, and time.  Skin: Skin is warm and dry.  Psychiatric: He has a normal mood and affect. His behavior is normal.          Assessment & Plan:

## 2011-05-29 NOTE — Patient Instructions (Signed)
Stop the glipizide

## 2011-05-30 LAB — COMPLETE METABOLIC PANEL WITH GFR
Albumin: 4.5 g/dL (ref 3.5–5.2)
BUN: 20 mg/dL (ref 6–23)
CO2: 24 mEq/L (ref 19–32)
GFR, Est African American: >60 mL/min (ref >60)
GFR, Est Non African American: >60 mL/min (ref >60)
Glucose, Bld: 79 mg/dL (ref 70–99)
Sodium: 140 mEq/L (ref 135–145)
Total Bilirubin: 0.7 mg/dL (ref 0.3–1.2)
Total Protein: 7.3 g/dL (ref 6.0–8.3)

## 2011-05-30 LAB — LIPID PANEL
Cholesterol: 93 mg/dL (ref 0–200)
HDL: 38 mg/dL — ABNORMAL LOW (ref >39)

## 2011-05-30 LAB — PSA: PSA: 0.08 ng/mL (ref <=4.00)

## 2011-06-01 ENCOUNTER — Telehealth: Payer: Self-pay | Admitting: Family Medicine

## 2011-06-01 NOTE — Telephone Encounter (Signed)
LMOM informing Pt of the above 

## 2011-06-01 NOTE — Telephone Encounter (Signed)
Pt would like to get copies of his recent lab results faxed to [phone]. Plan:  Labs faxed. Jarvis Newcomer, LPN [facility]

## 2011-06-01 NOTE — Telephone Encounter (Signed)
Call patient: Complete metabolic panel looks great. Cholesterol looks good except for HDL. The HDL is the hyphae or good cholesterol and it is low. Regular exercise will help improve this. We could decrease his simvastatin to 20 mg if he would like, or he can stay with the 40 mg he is currently on, either way is fine. Recheck cholesterol in one year.

## 2011-07-06 LAB — I-STAT 8, (EC8 V) (CONVERTED LAB)
BUN: 23
Bicarbonate: 25.2 — ABNORMAL HIGH
Glucose, Bld: 127 — ABNORMAL HIGH
Hemoglobin: 15.6
Sodium: 135
pCO2, Ven: 36.5 — ABNORMAL LOW
pH, Ven: 7.447 — ABNORMAL HIGH

## 2011-07-06 LAB — DIFFERENTIAL
Basophils Absolute: 0.1
Lymphocytes Relative: 28
Neutro Abs: 4.4

## 2011-07-06 LAB — POCT CARDIAC MARKERS
CKMB, poc: <1 — ABNORMAL LOW
Myoglobin, poc: 78.8
Operator id: 234501
Troponin i, poc: <0.05

## 2011-07-06 LAB — CK TOTAL AND CKMB (NOT AT ARMC)
CK, MB: 1.4
Total CK: 48

## 2011-07-06 LAB — CBC
Platelets: 152
RDW: 13.6
WBC: 7.3

## 2011-07-06 LAB — TROPONIN I: Troponin I: 0.01

## 2011-07-09 ENCOUNTER — Encounter: Payer: Self-pay | Admitting: Family Medicine

## 2011-07-09 ENCOUNTER — Inpatient Hospital Stay (INDEPENDENT_AMBULATORY_CARE_PROVIDER_SITE_OTHER)
Admission: RE | Admit: 2011-07-09 | Discharge: 2011-07-09 | Disposition: A | Discharge status: Home / Self Care | Payer: Medicare Other | Source: Ambulatory Visit | Attending: Family Medicine | Admitting: Family Medicine

## 2011-07-09 DIAGNOSIS — L0201 Cutaneous abscess of face: Secondary | ICD-10-CM

## 2011-07-09 DIAGNOSIS — L03211 Cellulitis of face: Secondary | ICD-10-CM

## 2011-07-13 ENCOUNTER — Telehealth (INDEPENDENT_AMBULATORY_CARE_PROVIDER_SITE_OTHER): Payer: Self-pay | Admitting: Emergency Medicine

## 2011-08-24 NOTE — Telephone Encounter (Signed)
  Phone Note Outgoing Call Call back at [facility] Phone [phone]   Call placed by: Emilio Math,  July 13, 2011 7:02 PM Call placed to: Patient Summary of Call: Advised pt to finish all meds tested positive for infection, he said it was getting better

## 2011-08-24 NOTE — Progress Notes (Signed)
Summary: Infection on face (proc. rm)   Vital Signs:  Patient Profile:   70 Years Old Male CC:      facial infection x 1 week Height:     72 inches Weight:      198 pounds O2 Sat:      96 % O2 treatment:    Room Air Temp:     98.4 degrees F oral Pulse rate:   52 / minute Resp:     14 per minute BP sitting:   150 / 77  (left arm) Cuff size:   large  Vitals Entered By: Lajean Saver RN (July 09, 2011 1:04 PM)                  Updated Prior Medication List: METOPROLOL SUCCINATE 100 MG  TB24 (METOPROLOL SUCCINATE) Take 1/2  tablet by mouth once a day METFORMIN HCL 1000 MG TABS (METFORMIN HCL) 1 tab by mouth two times a day with food [facility] ASPIRIN 325 MG  TABS (ASPIRIN) Take 1 tablet by mouth once a day CARDURA 2 MG  TABS (DOXAZOSIN MESYLATE) 1 tab by mouth q HS FAMOTIDINE 40 MG TABS (FAMOTIDINE) 1 tab by mouth bid GLIPIZIDE 5 MG TABS (GLIPIZIDE) 1 tab by mouth two times a day with breakfast and dinner HYZAAR 100-25 MG TABS (LOSARTAN POTASSIUM-HCTZ) take one tablet by mouth daily. SIMVASTATIN 80 MG TABS (SIMVASTATIN)   Current Allergies (reviewed today): ! * SODIUM PENTOTHALHistory of Present Illness Chief Complaint: facial infection x 1 week History of Present Illness:  Subjective:  Patient complains of infection on left temple that has been present for a week.  The lesion was initially larger but drained a small amount of pus.  He states that there was no nodule or lesion at location prior to its onset.  He has had several similar lesions on his face in the past.  REVIEW OF SYSTEMS Constitutional Symptoms      Denies fever, chills, night sweats, weight loss, weight gain, and fatigue.  Eyes       Denies change in vision, eye pain, eye discharge, glasses, contact lenses, and eye surgery. Ear/Nose/Throat/Mouth       Denies hearing loss/aids, change in hearing, ear pain, ear discharge, dizziness, frequent runny nose, frequent nose bleeds, sinus problems, sore throat,  hoarseness, and tooth pain or bleeding.  Respiratory       Denies dry cough, productive cough, wheezing, shortness of breath, asthma, bronchitis, and emphysema/COPD.  Cardiovascular       Denies murmurs, chest pain, and tires easily with exhertion.    Gastrointestinal       Denies stomach pain, nausea/vomiting, diarrhea, constipation, blood in bowel movements, and indigestion. Genitourniary       Denies painful urination, blood or discharge from penis, kidney stones, and loss of urinary control. Neurological       Denies paralysis, seizures, and fainting/blackouts. Musculoskeletal       Denies muscle pain, joint pain, joint stiffness, decreased range of motion, redness, swelling, muscle weakness, and gout.  Skin       Complains of unusual moles/lumps or sores.      Denies bruising and hair/skin or nail changes.      Comments: face Psych       Denies mood changes, temper/anger issues, anxiety/stress, speech problems, depression, and sleep problems. Other Comments: Patient presents with a facial infection near his left eye. It has been there 1 week. He has used warm compress. He has a history of frequent  abcess/boils. Reports they were never cultured, antibiotic usually clear it.   Past History:  Past Medical History: Reviewed history from 05/23/2010 and no changes required. 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 father died AMI at 80 mother died old age at 27 brother CAD sister healthy  Social  History: Reviewed history from 09/06/2009 and no changes required. 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  Has 4 kids, 2 are local. Quit smoking 8-08 after 20 pack years. 1 ETOH/ wk.  Fair diet. No regular exercise.   Objective:  No acute distress  Face:  There is a 1.5cm dia fluctuant cystic lesion on left temple with a central eschar. Assessment New Problems: ABSCESS, FACE (ICD-682.0)   Plan New Medications/Changes: DOXYCYCLINE HYCLATE 100 MG CAPS (DOXYCYCLINE HYCLATE) One by mouth two times a day  #20 x 0, 07/09/2011, Donna Christen MD  New Orders: T-Culture, Wound [[id number]] I&D Abscess, Simple / Single [10060] New Patient Level III [91478] Planning Comments:   Wound Culture sent.  Advised to change bandage daily until healed Return if not improving.   The patient and/or caregiver has been counseled thoroughly with regard to medications prescribed including dosage, schedule, interactions, rationale for use, and possible side effects and they verbalize understanding.  Diagnoses and expected course of recovery discussed and will return if not improved as expected or if the condition worsens. Patient and/or caregiver verbalized understanding.   PROCEDURE:   I & D Site: Left temple Size: 1.5cm Anesthesia: 1% lidocaine with epinephrine Procedure: Procedure:  Incise and Drain Abscess Risks and benefits of procedure explained to patient and verbal consent granted.  Using sterile technique and local anesthesia with 1% lidocaine with epinephrine, cleansed affected area with Betadine and saline. Identified the most fluctuant area of lesion and incised with a #11 blade.  Expressed a small amount of purulent discharge.  No packing necessary.  Bandage applied.  Patient tolerated well.  Disposition: Home Specimen sent to lab for evaluation. Prescriptions: DOXYCYCLINE HYCLATE 100 MG CAPS (DOXYCYCLINE HYCLATE) One by mouth two times a day  #20 x 0   Entered and  Authorized by:   Donna Christen MD   Signed by:   Donna Christen MD on 07/09/2011   Method used:   Electronically to        [facility] [phone]* (retail)       [address] [place], [place]  [address]       Ph: [phone]       Fax: [phone]   RxID:   [id number]   Orders Added: 1)  T-Culture, Wound [[id number]] 2)  I&D Abscess, Simple / Single [10060] 3)  New Patient Level III [25956]

## 2011-09-03 ENCOUNTER — Ambulatory Visit (INDEPENDENT_AMBULATORY_CARE_PROVIDER_SITE_OTHER): Payer: Medicare Other | Admitting: Family Medicine

## 2011-09-03 ENCOUNTER — Encounter: Payer: Self-pay | Admitting: Family Medicine

## 2011-09-03 DIAGNOSIS — R194 Change in bowel habit: Secondary | ICD-10-CM

## 2011-09-03 DIAGNOSIS — R7309 Other abnormal glucose: Secondary | ICD-10-CM

## 2011-09-03 DIAGNOSIS — K602 Anal fissure, unspecified: Secondary | ICD-10-CM

## 2011-09-03 DIAGNOSIS — L723 Sebaceous cyst: Secondary | ICD-10-CM

## 2011-09-03 DIAGNOSIS — I10 Essential (primary) hypertension: Secondary | ICD-10-CM

## 2011-09-03 DIAGNOSIS — E785 Hyperlipidemia, unspecified: Secondary | ICD-10-CM

## 2011-09-03 DIAGNOSIS — R739 Hyperglycemia, unspecified: Secondary | ICD-10-CM

## 2011-09-03 DIAGNOSIS — R198 Other specified symptoms and signs involving the digestive system and abdomen: Secondary | ICD-10-CM

## 2011-09-03 NOTE — Progress Notes (Signed)
  Subjective:    Patient ID: [name] [name], male    DOB: 1940-10-31, 70 y.o.   MRN: [id number]  HPI #1 hypertension patient has hypertension he is currently on medication. 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 Chilton Si surgeon but he reports that the cream that Dr. Chilton Si placed him on has not helped the fissure and he wants to what to do next #6 constipation he's had increased trouble with his bowels states that there was a time recently when he was going very regularly. Recently after a trip to the beach he states that he does symptoms some changes in his bowel habits. He reports that he sometimes if he goes a few days has to take a duclox that day day and sometimes it takes a second duclox doses the next day if he is unsuccessful. He did note that he's had a colonoscopy within the last 2 years. Review of Systems Essentially unremarkable    Objective:   Physical Exam BP 130/64  Pulse 54  Ht 5\' 9"  (1.753 m)  Wt 197 lb (89.359 kg)  BMI 29.09 kg/m2  SpO2 99%  Well-nourished well-developed white male no acute distress Neck supple trachea midline small to medium sized sebaceous cyst on the left side of his neck lungs were clear to auscultation and percussion cardiovascular S1-S2 regular rate and rhythm     Assessment & Plan:  #1 hypertension continue current blood pressure medicine the Toprol XL Cozaar and Doxazosin. #2 elevated blood sugar been checking his HEENT hemoglobin A1c has been under 5.5 will recommend recheck this in about 6 months which be about marked but I agree he does not need anydiabetic medication at this  time #3 hyperlipidemia continue on current medications Zocor cholesterol done or checked August looks good #4 sebaceous cyst or not infected if it continues to grow or remains irritated because of the shaving over it to come back and we can remove this lesion #5 rectal fissure follow up with surgeon Dr. Chilton Si to see further applying cream if needed or whether surgery is needed at this time. Patient seems to seem to indicate that he is ready to have surgery done #6 constipation reassured that this time nothing further needs to be done with the colonoscopy being negative at less than 2 years with a minimal amount of laxative/stool softener needed to have a good elimination

## 2011-09-03 NOTE — Patient Instructions (Signed)

## 2011-10-12 ENCOUNTER — Telehealth: Payer: Self-pay

## 2011-10-19 ENCOUNTER — Telehealth: Payer: Self-pay

## 2011-10-29 ENCOUNTER — Other Ambulatory Visit: Payer: Self-pay | Admitting: Cardiology

## 2011-10-29 DIAGNOSIS — I6529 Occlusion and stenosis of unspecified carotid artery: Secondary | ICD-10-CM

## 2011-10-30 ENCOUNTER — Encounter (INDEPENDENT_AMBULATORY_CARE_PROVIDER_SITE_OTHER): Payer: Medicare Other | Admitting: *Deleted

## 2011-10-30 ENCOUNTER — Ambulatory Visit (INDEPENDENT_AMBULATORY_CARE_PROVIDER_SITE_OTHER): Payer: Medicare Other | Admitting: Cardiovascular Disease

## 2011-10-30 DIAGNOSIS — E119 Type 2 diabetes mellitus without complications: Secondary | ICD-10-CM

## 2011-10-30 DIAGNOSIS — I2510 Atherosclerotic heart disease of native coronary artery without angina pectoris: Secondary | ICD-10-CM

## 2011-10-30 DIAGNOSIS — I10 Essential (primary) hypertension: Secondary | ICD-10-CM

## 2011-10-30 DIAGNOSIS — E7800 Pure hypercholesterolemia, unspecified: Secondary | ICD-10-CM

## 2011-10-30 DIAGNOSIS — I6529 Occlusion and stenosis of unspecified carotid artery: Secondary | ICD-10-CM

## 2011-10-30 DIAGNOSIS — I779 Disorder of arteries and arterioles, unspecified: Secondary | ICD-10-CM

## 2011-10-30 MED ORDER — LOSARTAN POTASSIUM 100 MG PO TABS: 100.0000 mg | ORAL_TABLET | Freq: Every day | ORAL | Status: DC

## 2011-10-30 NOTE — Assessment & Plan Note (Signed)
Discussed low carb diet.  Target hemoglobin A1c is 6.5 or less.  Continue current medications.  

## 2011-10-30 NOTE — Patient Instructions (Signed)
Your physician wants you to follow-up in:  6 MONTHS WITH DR NISHAN  You will receive a reminder letter in the mail two months in advance. If you don't receive a letter, please call our office to schedule the follow-up appointment. Your physician recommends that you continue on your current medications as directed. Please refer to the Current Medication list given to you today. 

## 2011-10-30 NOTE — Progress Notes (Signed)
[name] [name] is seen today in F/U for CAD, HTN and elevated chol.  Has a friend from [facility] living with him now since his wife died.  Still selling rope.  I believe he's had a stent to the OM in 96 and subsequent intervention to LAD. 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 He is not having any SSCP, palpitatoins SOB or edema. He has been compliant with his meds. His LDL has been under 100 with normal LFT's He has moderate carotid disease and is getting a F/U duplex today  ROS: Denies fever, malais, weight loss, blurry vision, decreased visual acuity, cough, sputum, SOB, hemoptysis, pleuritic pain, palpitaitons, heartburn, abdominal pain, melena, lower extremity edema, claudication, or rash.  All other systems reviewed and negative  General: Affect appropriate Healthy:  appears stated age HEENT: normal Neck supple with no adenopathy JVP normal no bruits no thyromegaly Lungs clear with no wheezing and good diaphragmatic motion Heart:  S1/S2 no murmur, no rub, gallop or click PMI normal Abdomen: benighn, BS positve, no tenderness, no AAA no bruit.  No HSM or HJR Distal pulses intact with no bruits No edema Neuro non-focal Skin warm and dry No muscular weakness   Current Outpatient Prescriptions  Medication Sig Dispense Refill  . aspirin 325 MG tablet Take 325 mg by mouth daily.        . clopidogrel (PLAVIX) 75 MG tablet Take 1 tablet (75 mg total) by mouth daily.  90 tablet  4  . doxazosin (CARDURA) 2 MG tablet Take 2 mg by mouth at bedtime.        Marland Kitchen losartan (COZAAR) 100 MG tablet Take 1 tablet (100 mg total) by mouth daily.  90 tablet  3  . metoprolol (TOPROL-XL) 100 MG 24 hr tablet Take 50 mg by mouth daily.        . simvastatin (ZOCOR) 80 MG tablet Take 0.5 tablets (40 mg total) by mouth at bedtime.  45 tablet  3    Allergies  Review of patient's allergies indicates no known allergies.  Electrocardiogram:  Assessment and Plan

## 2011-10-30 NOTE — Assessment & Plan Note (Signed)
F/U duplex  ASA  No TIA symptoms  

## 2011-10-30 NOTE — Assessment & Plan Note (Signed)
Stable with no angina and good activity level.  Continue medical Rx  

## 2011-10-30 NOTE — Assessment & Plan Note (Signed)
Well controlled.  Continue current medications and low sodium Dash type diet.    

## 2011-10-30 NOTE — Assessment & Plan Note (Signed)
Cholesterol is at goal.  Continue current dose of statin and diet Rx.  No myalgias or side effects.  F/U  LFT's in 6 months. Lab Results  Component Value Date   LDLCALC 42 05/29/2011             

## 2011-10-31 ENCOUNTER — Other Ambulatory Visit: Payer: Self-pay | Admitting: Cardiovascular Disease

## 2011-11-19 ENCOUNTER — Encounter: Payer: Self-pay | Admitting: *Deleted

## 2011-11-23 NOTE — Telephone Encounter (Signed)
Sent to me by mistake 

## 2011-11-24 ENCOUNTER — Encounter: Payer: Self-pay | Admitting: Family Medicine

## 2011-11-24 ENCOUNTER — Ambulatory Visit (INDEPENDENT_AMBULATORY_CARE_PROVIDER_SITE_OTHER): Payer: Medicare Other | Admitting: Family Medicine

## 2011-11-24 DIAGNOSIS — J302 Other seasonal allergic rhinitis: Secondary | ICD-10-CM

## 2011-11-24 DIAGNOSIS — J309 Allergic rhinitis, unspecified: Secondary | ICD-10-CM

## 2011-11-24 DIAGNOSIS — M779 Enthesopathy, unspecified: Secondary | ICD-10-CM

## 2011-11-24 DIAGNOSIS — J329 Chronic sinusitis, unspecified: Secondary | ICD-10-CM

## 2011-11-24 DIAGNOSIS — L723 Sebaceous cyst: Secondary | ICD-10-CM

## 2011-11-24 MED ORDER — CELECOXIB 200 MG PO CAPS: 200.0000 mg | ORAL_CAPSULE | Freq: Every day | ORAL | Status: DC

## 2011-11-24 MED ORDER — AMOXICILLIN-POT CLAVULANATE 875-125 MG PO TABS: 1.0000 | ORAL_TABLET | Freq: Two times a day (BID) | ORAL | Status: AC

## 2011-11-24 MED ORDER — FLUTICASONE PROPIONATE 50 MCG/ACT NA SUSP: 2.0000 | Freq: Every day | NASAL | Status: DC

## 2011-11-24 MED ORDER — MELOXICAM 7.5 MG PO TABS: 7.5000 mg | ORAL_TABLET | Freq: Every day | ORAL | Status: DC

## 2011-11-24 MED ORDER — FEXOFENADINE-PSEUDOEPHED ER 180-240 MG PO TB24: 1.0000 | ORAL_TABLET | Freq: Every day | ORAL | Status: DC

## 2011-11-24 MED ORDER — MONTELUKAST SODIUM 10 MG PO TABS: 10.0000 mg | ORAL_TABLET | Freq: Every day | ORAL | Status: DC

## 2011-11-24 NOTE — Patient Instructions (Signed)
Sinusitis Sinuses are air pockets within the bones of your face. The growth of bacteria within a sinus leads to infection. The infection prevents the sinuses from draining. This infection is called sinusitis. SYMPTOMS  There will be different areas of pain depending on which sinuses have become infected.  The maxillary sinuses often produce pain beneath the eyes.   Frontal sinusitis may cause pain in the middle of the forehead and above the eyes.  Other problems (symptoms) include:  Toothaches.   Colored, pus-like (purulent) drainage from the nose.   Swelling, warmth, and tenderness over the sinus areas may be signs of infection.  TREATMENT  Sinusitis is most often determined by an exam.X-rays may be taken. If x-rays have been taken, make sure you obtain your results or find out how you are to obtain them. Your caregiver may give you medications (antibiotics). These are medications that will help kill the bacteria causing the infection. You may also be given a medication (decongestant) that helps to reduce sinus swelling.  HOME CARE INSTRUCTIONS   Only take over-the-counter or prescription medicines for pain, discomfort, or fever as directed by your caregiver.   Drink extra fluids. Fluids help thin the mucus so your sinuses can drain more easily.   Applying either moist heat or ice packs to the sinus areas may help relieve discomfort.   Use saline nasal sprays to help moisten your sinuses. The sprays can be found at your local drugstore.  SEEK IMMEDIATE MEDICAL CARE IF:  You have a fever.   You have increasing pain, severe headaches, or toothache.   You have nausea, vomiting, or drowsiness.   You develop unusual swelling around the face or trouble seeing.  MAKE SURE YOU:   Understand these instructions.   Will watch your condition.   Will get help right away if you are not doing well or get worse.  Document Released: 09/07/2005 Document Revised: 08/27/2011 Document Reviewed:  04/06/2007 [facility] Patient Information 2012 [place], [place].Tendinitis Tendinitis is swelling and inflammation of the tendons. Tendons are band-like tissues that connect muscle to bone. Tendinitis commonly occurs in the:   Shoulders (rotator cuff).   Heels (Achilles tendon).   Elbows (triceps tendon).  CAUSES Tendinitis is usually caused by overusing the tendon, muscles, and joints involved. When the tissue surrounding a tendon (synovium) becomes inflamed, it is called tenosynovitis. Tendinitis commonly develops in people whose jobs require repetitive motions. SYMPTOMS  Pain.   Tenderness.   Mild swelling.  DIAGNOSIS Tendinitis is usually diagnosed by physical exam. Your caregiver may also order X-rays or other imaging tests. TREATMENT Your caregiver may recommend certain medicines or exercises for your treatment. HOME CARE INSTRUCTIONS   Use a sling or splint for as long as directed by your caregiver until the pain decreases.   Put ice on the injured area.   Put ice in a plastic bag.   Place a towel between your skin and the bag.   Leave the ice on for 15 to 20 minutes, 3 to 4 times a day.   Avoid using the limb while the tendon is painful. Perform gentle range of motion exercises only as directed by your caregiver. Stop exercises if pain or discomfort increase, unless directed otherwise by your caregiver.   Only take over-the-counter or prescription medicines for pain, discomfort, or fever as directed by your caregiver.  SEEK MEDICAL CARE IF:   Your pain and swelling increase.   You develop new, unexplained symptoms, especially increased numbness in the hands.  MAKE SURE YOU:   Understand these instructions.   Will watch your condition.   Will get help right away if you are not doing well or get worse.  Document Released: 09/04/2000 Document Revised: 08/27/2011 Document Reviewed: 11/24/2010 [facility] Patient Information 2012 [place], [place].

## 2011-11-24 NOTE — Progress Notes (Signed)
  Subjective:    Patient ID: [name] [name], male    DOB: 1940/11/13, 71 y.o.   MRN: [id number]  Sinusitis This is a recurrent problem. The current episode started more than 1 month ago. The problem has been waxing and waning since onset. There has been no fever. The pain is mild. Associated symptoms include congestion and swollen glands. Pertinent negatives include no hoarse voice, neck pain, sinus pressure or sneezing. Past treatments include oral decongestants. The treatment provided no relief.   Concern over lesion on the back of his neck.   Review of Systems  HENT: Positive for congestion. Negative for hoarse voice, sneezing, neck pain and sinus pressure.   Musculoskeletal: Positive for myalgias and joint swelling.       R shoulder pain irritation      BP 138/62  Pulse 62  Temp(Src) 98.5 F (36.9 C) (Oral)  Ht 5\' 9"  (1.753 m)  Wt 197 lb (89.359 kg)  BMI 29.09 kg/m2  SpO2 95% Objective:   Physical Exam  Nursing note and vitals reviewed. Constitutional: He is oriented to person, place, and time. He appears well-developed and well-nourished. No distress.  HENT:  Head: Normocephalic and atraumatic.  Right Ear: Tympanic membrane, external ear and ear canal normal.  Left Ear: Tympanic membrane, external ear and ear canal normal.  Nose: Mucosal edema and rhinorrhea present. Right sinus exhibits maxillary sinus tenderness. Left sinus exhibits maxillary sinus tenderness.  Mouth/Throat: Oropharynx is clear and moist. No oral lesions. No oropharyngeal exudate.  Eyes: Right eye exhibits no discharge. Left eye exhibits no discharge. No scleral icterus.  Neck: Neck supple.  Cardiovascular: Normal rate, regular rhythm and normal heart sounds.   Pulmonary/Chest: Effort normal and breath sounds normal. He has no wheezes. He has no rales.  Lymphadenopathy:    He has no cervical adenopathy.  Neurological: He is alert and oriented to person, place, and time.  Skin: Skin is warm and dry.   he  has appears to be a sebaceous cyst on the back of his neck. No signs of infection or irritation at this time. Along the right shoulder is tenderness along the tendon of his right arm. Consistent with tendinitis     Assessment & Plan:  #1 sinusitis allergies. Because of his history of recurrent sinusitis we'll be aggressive. Augmentin Flonase nasal spray Allegra-D and Singulair 10 mg. We'll see if this is effective thing of the sinuses off her chest x-ray but he declines.  #2 sebaceous cyst of the neck reassured that I would not recommend any action unless it becomes inflamed becomes larger or irritated #3 tendinitis explained normally would use Mobic but he is on Plavix and aspirin at night. He was low priced medication which would be Mobic but Mobic does not have clearance to be used Plavix like Celebrex. Thus we'll prescribe Celebrex and he'll have to make a decision whether he wants to pay for that medication. Return for followup in April. He can cancel his appointment for later in March.

## 2011-12-14 ENCOUNTER — Ambulatory Visit (INDEPENDENT_AMBULATORY_CARE_PROVIDER_SITE_OTHER): Payer: Medicare Other | Admitting: Family Medicine

## 2011-12-14 ENCOUNTER — Ambulatory Visit
Admission: RE | Admit: 2011-12-14 | Discharge: 2011-12-14 | Disposition: A | Discharge status: Home / Self Care | Payer: Medicare Other | Source: Ambulatory Visit | Attending: Family Medicine | Admitting: Family Medicine

## 2011-12-14 ENCOUNTER — Encounter: Payer: Self-pay | Admitting: Family Medicine

## 2011-12-14 VITALS — BP 160/82 | Temp 98.2°F | Ht 69.0 in | Wt 207.0 lb

## 2011-12-14 DIAGNOSIS — R0602 Shortness of breath: Secondary | ICD-10-CM

## 2011-12-14 DIAGNOSIS — M25473 Effusion, unspecified ankle: Secondary | ICD-10-CM

## 2011-12-14 DIAGNOSIS — L0201 Cutaneous abscess of face: Secondary | ICD-10-CM

## 2011-12-14 DIAGNOSIS — L0291 Cutaneous abscess, unspecified: Secondary | ICD-10-CM

## 2011-12-14 DIAGNOSIS — R609 Edema, unspecified: Secondary | ICD-10-CM

## 2011-12-14 MED ORDER — FUROSEMIDE 20 MG PO TABS: 20.0000 mg | ORAL_TABLET | Freq: Every day | ORAL | Status: DC

## 2011-12-14 MED ORDER — SULFAMETHOXAZOLE-TRIMETHOPRIM 800-160 MG PO TABS: 1.0000 | ORAL_TABLET | Freq: Two times a day (BID) | ORAL | Status: DC

## 2011-12-14 NOTE — Patient Instructions (Addendum)
Stop the celebrex.   Avoid salt Take the fluid pill daily for 3 days Restrict fluid to no more than 60 ounces in a day. 1.5 Gram Low Sodium Diet A 1.5 gram sodium diet restricts the amount of sodium in the diet to no more than 1.5 g or 1500 mg daily. The American Heart Association recommends Americans over the age of 4 to consume no more than 1500 mg of sodium each day to reduce the risk of developing high blood pressure. Research also shows that limiting sodium may reduce heart attack and stroke risk. Many foods contain sodium for flavor and sometimes as a preservative. When the amount of sodium in a diet needs to be low, it is important to know what to look for when choosing foods and drinks. The following includes some information and guidelines to help make it easier for you to adapt to a low sodium diet. QUICK TIPS  Do not add salt to food.   Avoid convenience items and fast food.   Choose unsalted snack foods.   Buy lower sodium products, often labeled as "lower sodium" or "no salt added."   Check food labels to learn how much sodium is in 1 serving.   When eating at a restaurant, ask that your food be prepared with less salt or none, if possible.  READING FOOD LABELS FOR SODIUM INFORMATION The nutrition facts label is a good place to find how much sodium is in foods. Look for products with no more than 400 mg of sodium per serving. Remember that 1.5 g = 1500 mg. The food label may also list foods as:  Sodium-free: Less than 5 mg in a serving.   Very low sodium: 35 mg or less in a serving.   Low-sodium: 140 mg or less in a serving.   Light in sodium: 50% less sodium in a serving. For example, if a food that usually has 300 mg of sodium is changed to become light in sodium, it will have 150 mg of sodium.   Reduced sodium: 25% less sodium in a serving. For example, if a food that usually has 400 mg of sodium is changed to reduced sodium, it will have 300 mg of sodium.  CHOOSING  FOODS Grains  Avoid: Salted crackers and snack items. Some cereals, including instant hot cereals. Bread stuffing and biscuit mixes. Seasoned rice or pasta mixes.   Choose: Unsalted snack items. Low-sodium cereals, oats, puffed wheat and rice, shredded wheat. English muffins and bread. Pasta.  Meats  Avoid: Salted, canned, smoked, spiced, pickled meats, including fish and poultry. Bacon, ham, sausage, cold cuts, hot dogs, anchovies.   Choose: Low-sodium canned tuna and salmon. Fresh or frozen meat, poultry, and fish.  Dairy  Avoid: Processed cheese and spreads. Cottage cheese. Buttermilk and condensed milk. Regular cheese.   Choose: Milk. Low-sodium cottage cheese. Yogurt. Sour cream. Low-sodium cheese.  Fruits and Vegetables  Avoid: Regular canned vegetables. Regular canned tomato sauce and paste. Frozen vegetables in sauces. Olives. Rosita Fire. Relishes. Sauerkraut.   Choose: Low-sodium canned vegetables. Low-sodium tomato sauce and paste. Frozen or fresh vegetables. Fresh and frozen fruit.  Condiments  Avoid: Canned and packaged gravies. Worcestershire sauce. Tartar sauce. Barbecue sauce. Soy sauce. Steak sauce. Ketchup. Onion, garlic, and table salt. Meat flavorings and tenderizers.   Choose: Fresh and dried herbs and spices. Low-sodium varieties of mustard and ketchup. Lemon juice. Tabasco sauce. Horseradish.  SAMPLE 1.5 GRAM SODIUM MEAL PLAN Breakfast / Sodium (mg)  1 cup low-fat milk /  143 mg   1 whole-wheat English muffin / 240 mg   1 tbs heart-healthy margarine / 153 mg   1 hard-boiled egg / 139 mg   1 small orange / 0 mg  Lunch / Sodium (mg)  1 cup raw carrots / 76 mg   2 tbs no salt added peanut butter / 5 mg   2 slices whole-wheat bread / 270 mg   1 tbs jelly / 6 mg    cup red grapes / 2 mg  Dinner / Sodium (mg)  1 cup whole-wheat pasta / 2 mg   1 cup low-sodium tomato sauce / 73 mg   3 oz lean ground beef / 57 mg   1 small side salad (1 cup raw  spinach leaves,  cup cucumber,  cup yellow bell pepper) with 1 tsp olive oil and 1 tsp red wine vinegar / 25 mg  Snack / Sodium (mg)  1 container low-fat vanilla yogurt / 107 mg   3 graham cracker squares / 127 mg  Nutrient Analysis  Calories: 1745   Protein: 75 g   Carbohydrate: 237 g   Fat: 57 g   Sodium: 1425 mg  Document Released: 09/07/2005 Document Revised: 08/27/2011 Document Reviewed: 12/09/2009 [facility] Patient Information 2012 [place], [place].

## 2011-12-14 NOTE — Progress Notes (Signed)
Subjective:    Patient ID: [name] [name], male    DOB: 1941/05/28, 71 y.o.   MRN: [id number]  Shortness of Breath This is a chronic problem. The current episode started 1 to 4 weeks ago. The problem occurs intermittently (worse at night when lays flat. ). Associated symptoms include leg swelling, orthopnea and sputum production. Pertinent negatives include no chest pain, hemoptysis, leg pain or sore throat. His past medical history is significant for allergies.   He is also here for swollen area above his left eye along the eyebrow ridge. He says that he picked at it this morning and had some yellow pus come out of it. He denies any fever. It is very tender. He denies that his affected his vision.   Review of Systems  HENT: Negative for sore throat.   Respiratory: Positive for sputum production and shortness of breath. Negative for hemoptysis.   Cardiovascular: Positive for orthopnea and leg swelling. Negative for chest pain.   BP 160/82  Temp(Src) 98.2 F (36.8 C) (Oral)  Ht 5\' 9"  (1.753 m)  Wt 207 lb (93.895 kg)  BMI 30.57 kg/m2  SpO2 95%    No Known Allergies  Past Medical History  Diagnosis Date  . CAD (coronary artery disease)   . Bradycardia   . Bruit   . Dyspnea   . Hypercholesteremia     II A  . Hypertension   . Seborrheic keratosis   . Bigeminy   . Claudication   . Allergic rhinitis   . Malignant neoplasm of prostate   . Microalbuminuria   . Diabetes mellitus type II   . History of prostate cancer     Dr. Patsi Sears    Past Surgical History  Procedure Date  . Coronary angioplasty with stent placement     History   Social History  . Marital Status: Married    Spouse Name: N/A    Number of Children: N/A  . Years of Education: N/A   Occupational History  . Retired but still works Warden/ranger    Social History Main Topics  . Smoking status: Former Smoker -- 1.0 packs/day for 20 years    Quit date: 04/22/2007  . Smokeless tobacco: Not on file  .  Alcohol Use: 0.5 oz/week    1 drink(s) per week  . Drug Use: Not on file  . Sexually Active: Not on file   Other Topics Concern  . Not on file   Social History Narrative   Widowed 12/2010Has 4 kids, 2 are localFair dietNo regular exercise    Family History  Problem Relation Age of Onset  . Heart attack Father   . Coronary artery disease Brother        Objective:   Physical Exam  Constitutional: He is oriented to person, place, and time. He appears well-developed and well-nourished.  HENT:  Head: Normocephalic and atraumatic.  Cardiovascular: Normal rate, regular rhythm and normal heart sounds.        Bilateral carotid bruits. He says he is followed every 6 months by Dr. Charlton Haws.   Pulmonary/Chest: Effort normal and breath sounds normal.  Musculoskeletal: He exhibits edema.       Bilateral trace ankle edema.  Neurological: He is alert and oriented to person, place, and time.  Skin: Skin is warm and dry.       He has a 2 cm abscess on the left eyebrow ridge. Some purple discoloration going into his upper eyelid. No active drainage.  Psychiatric:  He has a normal mood and affect. His behavior is normal.          Assessment & Plan:  Shortness of breath-most likely secondary to volume overload. I will have him stop his Celebrex as well as this can certainly contribute. We will check a BMP today and a chest x-ray. I will start him on Lasix 20 mg once a day for the next 3 days. Also check a BNP. He has a history of coronary artery disease but no prior history of congestive heart failure. His heart rhythm is regular today. EKG shows normal sinus rhythm with a PVC for every third beat. Normal axis. No Q waves.  Abscess, face-see procedure note for incision and drainage. Patient tolerated this well. 1 care discussed. I will start him on Bactrim twice a day for 10 days. Wound culture was sent to evaluate for MRSA.  Incision and Drainage Procedure Note  Pre-operative Diagnosis:  abscess, face   Post-operative Diagnosis: same  Indications: infected  Anesthesia: 1% plain lidocaine  Procedure Details  The procedure, risks and complications have been discussed in detail (including, but not limited to airway compromise, infection, bleeding) with the patient, and the patient has signed consent to the procedure.  The skin was sterilely prepped and draped over the affected area in the usual fashion. After adequate local anesthesia, I&D with a #11 and 10 blade was performed on the left eyebrow ridge. Purulent drainage: present. Wound was probed with applicator to make sure not tracks, etc.  The patient was observed until stable.  Findings: Abscess   EBL: 0 cc's  Drains: none  Condition: Tolerated procedure well and Stable   Complications: none.

## 2011-12-15 LAB — BASIC METABOLIC PANEL
CO2: 29 mEq/L (ref 19–32)
Calcium: 9.3 mg/dL (ref 8.4–10.5)
Sodium: 142 mEq/L (ref 135–145)

## 2011-12-16 ENCOUNTER — Encounter: Payer: Self-pay | Admitting: *Deleted

## 2011-12-17 ENCOUNTER — Other Ambulatory Visit: Payer: Self-pay | Admitting: Family Medicine

## 2011-12-17 DIAGNOSIS — A4902 Methicillin resistant Staphylococcus aureus infection, unspecified site: Secondary | ICD-10-CM

## 2011-12-17 LAB — WOUND CULTURE

## 2011-12-17 MED ORDER — MUPIROCIN 2 % EX OINT: TOPICAL_OINTMENT | CUTANEOUS | Status: DC

## 2011-12-21 ENCOUNTER — Other Ambulatory Visit: Payer: Self-pay | Admitting: *Deleted

## 2011-12-21 ENCOUNTER — Encounter: Payer: Self-pay | Admitting: Family Medicine

## 2011-12-21 ENCOUNTER — Ambulatory Visit (INDEPENDENT_AMBULATORY_CARE_PROVIDER_SITE_OTHER): Payer: Medicare Other | Admitting: Family Medicine

## 2011-12-21 VITALS — BP 180/78 | HR 70 | Ht 69.0 in | Wt 199.0 lb

## 2011-12-21 DIAGNOSIS — E8779 Other fluid overload: Secondary | ICD-10-CM

## 2011-12-21 DIAGNOSIS — J90 Pleural effusion, not elsewhere classified: Secondary | ICD-10-CM

## 2011-12-21 DIAGNOSIS — Z1331 Encounter for screening for depression: Secondary | ICD-10-CM

## 2011-12-21 DIAGNOSIS — M25473 Effusion, unspecified ankle: Secondary | ICD-10-CM

## 2011-12-21 DIAGNOSIS — Z9181 History of falling: Secondary | ICD-10-CM

## 2011-12-21 DIAGNOSIS — E8770 Fluid overload, unspecified: Secondary | ICD-10-CM

## 2011-12-21 DIAGNOSIS — J329 Chronic sinusitis, unspecified: Secondary | ICD-10-CM

## 2011-12-21 DIAGNOSIS — R0602 Shortness of breath: Secondary | ICD-10-CM

## 2011-12-21 DIAGNOSIS — I10 Essential (primary) hypertension: Secondary | ICD-10-CM

## 2011-12-21 DIAGNOSIS — I2510 Atherosclerotic heart disease of native coronary artery without angina pectoris: Secondary | ICD-10-CM

## 2011-12-21 DIAGNOSIS — E119 Type 2 diabetes mellitus without complications: Secondary | ICD-10-CM

## 2011-12-21 DIAGNOSIS — J302 Other seasonal allergic rhinitis: Secondary | ICD-10-CM

## 2011-12-21 LAB — POCT GLYCOSYLATED HEMOGLOBIN (HGB A1C): Hemoglobin A1C: 6.5

## 2011-12-21 MED ORDER — MONTELUKAST SODIUM 10 MG PO TABS: 10.0000 mg | ORAL_TABLET | Freq: Every day | ORAL | Status: DC

## 2011-12-21 MED ORDER — FEXOFENADINE HCL 180 MG PO TABS: 180.0000 mg | ORAL_TABLET | Freq: Every day | ORAL | Status: DC

## 2011-12-21 MED ORDER — AMBULATORY NON FORMULARY MEDICATION: Status: DC

## 2011-12-21 MED ORDER — METFORMIN HCL 500 MG PO TABS: 500.0000 mg | ORAL_TABLET | Freq: Two times a day (BID) | ORAL | Status: DC

## 2011-12-21 MED ORDER — FUROSEMIDE 20 MG PO TABS: 20.0000 mg | ORAL_TABLET | Freq: Every day | ORAL | Status: DC

## 2011-12-21 MED ORDER — FLUTICASONE PROPIONATE 50 MCG/ACT NA SUSP: 2.0000 | Freq: Every day | NASAL | Status: DC

## 2011-12-21 NOTE — Progress Notes (Signed)
  Subjective:    Patient ID: [name] [name], male    DOB: 1941/04/24, 71 y.o.   MRN: [id number]  HPI F/U volume overload. Says ankle swelling has resolved and able to sleep much better.  His shortness of breath with lying flat has resolved. Still occ sob with dancing but says that has been there for 10 years.    HTN- Says taking meds regularly. NO CP, occ SOB see above. Hx of CAD.  No new onset SOB.  Took meds today.  Says toprol XL was very expensive.    Diabetes-he denies any problems. He does admit he has gained some weight and has not been as active recently. He does not check his sugars very regularly. He is to be on metformin but has been able to come off of it. His last A1c check was back in the fall. He is currently diet controlled. He feels that he generally eats healthy.  Review of Systems     Objective:   Physical Exam  Constitutional: He is oriented to person, place, and time. He appears well-developed and well-nourished.  HENT:  Head: Normocephalic and atraumatic.  Cardiovascular: Normal rate, regular rhythm and normal heart sounds.   Pulmonary/Chest: Effort normal and breath sounds normal.  Musculoskeletal: He exhibits no edema.       Ankles look great today.   Neurological: He is alert and oriented to person, place, and time.  Skin: Skin is warm and dry.  Psychiatric: He has a normal mood and affect. His behavior is normal.          Assessment & Plan:  Volume Overload/Edema - his weight is down 7 pounds. Recheck BMP and continue his lasix. REcently saw cardiology. Work on low salt diet.  New rx sent.   Consider shingles vaccine given rx.  Covered at the pharmacy under Medicare Part D   DM- A1C is 6.5. Well controlled. Needs to start metformin. He has been off meds for awhile but admits he has gained some weight. F/U in 3- 4 months.   HTN- Not well controlled.  Will stop Allegra-D and change to plain allegra. F/U in 1 month for BP check.  Will also work on low sat  diet.  When due for refill on metoprolol can change to BID version which is much cheaper.    AR- needs refills. Stop decongestant bc of BP.   Depression screening-PHQ 9 score of 0. Neg for depression.    fall risk assessment-Score of 5, moderate. Will give h.o on fall prevention.

## 2011-12-21 NOTE — Patient Instructions (Addendum)
1.5 Gram Low Sodium Diet A 1.5 gram sodium diet restricts the amount of sodium in the diet to no more than 1.5 g or 1500 mg daily. The American Heart Association recommends Americans over the age of 20 to consume no more than 1500 mg of sodium each day to reduce the risk of developing high blood pressure. Research also shows that limiting sodium may reduce heart attack and stroke risk. Many foods contain sodium for flavor and sometimes as a preservative. When the amount of sodium in a diet needs to be low, it is important to know what to look for when choosing foods and drinks. The following includes some information and guidelines to help make it easier for you to adapt to a low sodium diet. QUICK TIPS  Do not add salt to food.   Avoid convenience items and fast food.   Choose unsalted snack foods.   Buy lower sodium products, often labeled as "lower sodium" or "no salt added."   Check food labels to learn how much sodium is in 1 serving.   When eating at a restaurant, ask that your food be prepared with less salt or none, if possible.  READING FOOD LABELS FOR SODIUM INFORMATION The nutrition facts label is a good place to find how much sodium is in foods. Look for products with no more than 400 mg of sodium per serving. Remember that 1.5 g = 1500 mg. The food label may also list foods as:  Sodium-free: Less than 5 mg in a serving.   Very low sodium: 35 mg or less in a serving.   Low-sodium: 140 mg or less in a serving.   Light in sodium: 50% less sodium in a serving. For example, if a food that usually has 300 mg of sodium is changed to become light in sodium, it will have 150 mg of sodium.   Reduced sodium: 25% less sodium in a serving. For example, if a food that usually has 400 mg of sodium is changed to reduced sodium, it will have 300 mg of sodium.  CHOOSING FOODS Grains  Avoid: Salted crackers and snack items. Some cereals, including instant hot cereals. Bread stuffing and  biscuit mixes. Seasoned rice or pasta mixes.   Choose: Unsalted snack items. Low-sodium cereals, oats, puffed wheat and rice, shredded wheat. English muffins and bread. Pasta.  Meats  Avoid: Salted, canned, smoked, spiced, pickled meats, including fish and poultry. Bacon, ham, sausage, cold cuts, hot dogs, anchovies.   Choose: Low-sodium canned tuna and salmon. Fresh or frozen meat, poultry, and fish.  Dairy  Avoid: Processed cheese and spreads. Cottage cheese. Buttermilk and condensed milk. Regular cheese.   Choose: Milk. Low-sodium cottage cheese. Yogurt. Sour cream. Low-sodium cheese.  Fruits and Vegetables  Avoid: Regular canned vegetables. Regular canned tomato sauce and paste. Frozen vegetables in sauces. Olives. Pickles. Relishes. Sauerkraut.   Choose: Low-sodium canned vegetables. Low-sodium tomato sauce and paste. Frozen or fresh vegetables. Fresh and frozen fruit.  Condiments  Avoid: Canned and packaged gravies. Worcestershire sauce. Tartar sauce. Barbecue sauce. Soy sauce. Steak sauce. Ketchup. Onion, garlic, and table salt. Meat flavorings and tenderizers.   Choose: Fresh and dried herbs and spices. Low-sodium varieties of mustard and ketchup. Lemon juice. Tabasco sauce. Horseradish.  SAMPLE 1.5 GRAM SODIUM MEAL PLAN Breakfast / Sodium (mg)  1 cup low-fat milk / 143 mg   1 whole-wheat English muffin / 240 mg   1 tbs heart-healthy margarine / 153 mg   1 hard-boiled egg /   139 mg   1 small orange / 0 mg  Lunch / Sodium (mg)  1 cup raw carrots / 76 mg   2 tbs no salt added peanut butter / 5 mg   2 slices whole-wheat bread / 270 mg   1 tbs jelly / 6 mg    cup red grapes / 2 mg  Dinner / Sodium (mg)  1 cup whole-wheat pasta / 2 mg   1 cup low-sodium tomato sauce / 73 mg   3 oz lean ground beef / 57 mg   1 small side salad (1 cup raw spinach leaves,  cup cucumber,  cup yellow bell pepper) with 1 tsp olive oil and 1 tsp red wine vinegar / 25 mg  Snack /  Sodium (mg)  1 container low-fat vanilla yogurt / 107 mg   3 graham cracker squares / 127 mg  Nutrient Analysis  Calories: 1745   Protein: 75 g   Carbohydrate: 237 g   Fat: 57 g   Sodium: 1425 mg  Document Released: 09/07/2005 Document Revised: 08/27/2011 Document Reviewed: 12/09/2009 [facility] Patient Information 2012 [place], [place].Home Safety and Preventing Falls Falls are a leading cause of injury and while they affect all age groups, falls have greater short-term and long-term impact on older age groups. However, falls should not be a part of life or aging. It is possible for individuals and their families to use preventive measures to significantly decrease the likelihood that anyone, especially an older adult, will fall. There are many simple measures which can make your home safer with respect to preventing falls. The following actions can help reduce falls among all members of your family and are especially important as you age, when your balance, lower limb strength, coordination, and eyesight may be declining. The use of preventive measures will help to reduce you and your family's risk of falls and serious medical consequences. OUTDOORS  Repair cracks and edges of walkways and driveways.   Remove high doorway thresholds and trim shrubbery on the main path into your home.   Ensure there is good outside lighting at main entrances and along main walkways.   Clear walkways of tools, rocks, debris, and clutter.   Check that handrails are not broken and are securely fastened. Both sides of steps should have handrails.   In the garage, be attentive to and clean up grease or oil spills on the cement. This can make the surface extremely slippery.   In winter, have leaves, snow, and ice cleared regularly.   Use sand or salt on walkways during winter months.  BATHROOM  Install grab bars by the toilet and in the tub and shower.   Use non-skid mats or decals in the tub or  shower.   If unable to easily stand unsupported while showering, place a plastic non slip stool in the shower to sit on when needed.   Install night lights.   Keep floors dry and clean up all water on the floor immediately.   Remove soap buildup in tub or shower on a regular basis.   Secure bath mats with non-slip, double-sided rug tape.   Remove tripping hazards from the floors.  BEDROOMS  Install night lights.   Do not use oversized bedding.   Make sure a bedside light is easy to reach.   Keep a telephone by your bedside.   Make sure that you can get in and out of your bed easily.   Have a firm chair, with side  arms, to use for getting dressed.   Remove clutter from around closets.   Store clothing, bed coverings, and other household items where you can reach them comfortably.   Remove tripping hazards from the floor.  LIVING AREAS AND STAIRWAYS  Turn on lights to avoid having to walk through dark areas.   Keep lighting uniform in each room. Place brighter lightbulbs in darker areas, including stairways.   Replace lightbulbs that burn out in stairways immediately.   Arrange furniture to provide for clear pathways.   Keep furniture in the same place.   Eliminate or tape down electrical cables in high traffic areas.   Place handrails on both sides of stairways. Use handrails when going up or down stairs.   Most falls occur on the top or bottom 3 steps.   Fix any loose handrails. Make sure handrails on both sides of the stairways are as long as the stairs.   Remove all walkway obstacles.   Coil or tape electrical cords off to the side of walking areas and out of the way. If using many extension cords, have an electrician put in a new wall outlet to reduce or eliminate them.   Make sure spills are cleaned up quickly and allow time for drying before walking on freshly cleaned floors.   Firmly attach carpet with non-skid or two-sided tape.   Keep frequently  used items within easy reach.   Remove tripping hazards such as throw rugs and clutter in walkways. Never leave objects on stairs.   Get rid of throw rugs elsewhere if possible.   Eliminate uneven floor surfaces.   Make sure couches and chairs are easy to get into and out of.   Check carpeting to make sure it is firmly attached along stairs.   Make repairs to worn or loose carpet promptly.   Select a carpet pattern that does not visually hide the edge of steps.   Avoid placing throw rugs or scatter rugs at the top or bottom of stairways, or properly secure with carpet tape to prevent slippage.   Have an electrician put in a light switch at the top and bottom of the stairs.   Get light switches that glow.   Avoid the following practices: hurrying, inattention, obscured vision, carrying large loads, and wearing slip-on shoes.   Be aware of all pets.  KITCHEN  Place items that are used frequently, such as dishes and food, within easy reach.   Keep handles on pots and pans toward the center of the stove. Use back burners when possible.   Make sure spills are cleaned up quickly and allow time for drying.   Avoid walking on wet floors.   Avoid hot utensils and knives.   Position shelves so they are not too high or low.   Place commonly used objects within easy reach.   If necessary, use a sturdy step stool with a grab bar when reaching.   Make sure electrical cables are out of the way.   Do not use floor polish or wax that makes floors slippery.  OTHER HOME FALL PREVENTION STRATEGIES  Wear low heel or rubber sole shoes that are supportive and fit well.   Wear closed toe shoes.   Know and watch for side effects of medications. Have your caregiver or pharmacist look at all your medicines, even over-the-counter medicines. Some medicines can make you sleepy or dizzy.   Exercise regularly. Exercise makes you stronger and improves your balance and coordination.  Limit use  of alcohol.   Use eyeglasses if necessary and keep them clean. Have your vision checked every year.   Organize your household in a manner that minimizes the need to walk distances when hurried, or go up and down stairs unnecessarily. For example, have a phone placed on at least each floor of your home. If possible, have a phone beside each sitting or lying area where you spend the most time at home. Keep emergency numbers posted at all phones.   Use non-skid floor wax.   When using a ladder, make sure:   The base is firm.   All ladder feet are on level ground.   The ladder is angled against the wall properly.   When climbing a ladder, face the ladder and hold the ladder rungs firmly.   If reaching, always keep your hips and body weight centered between the rails.   When using a stepladder, make sure it is fully opened and both spreaders are firmly locked.   Do not climb a closed stepladder.   Avoid climbing beyond the second step from the top of a stepladder and the 4th rung from the top of an extension ladder.   Learn and use mobility aids as needed.   Change positions slowly. Arise slowly from sitting and lying positions. Sit on the edge of your bed before getting to your feet.   If you have a history of falls, ask someone to add color or contrast paint or tape to grab bars and handrails in your home.   If you have a history of falls, ask someone to place contrasting color strips on first and last steps.   Install an electrical emergency response system if you need one, and know how to use it.   If you have a medical or other condition that causes you to have limited physical strength, it is important that you reach out to family and friends for occasional help.  FOR CHILDREN:  If young children are in the home, use safety gates. At the top of stairs use screw-mounted gates; use pressure-mounted gates for the bottom of the stairs and doorways between rooms.   Young children  should be taught to descend stairs on their stomachs, feet first, and later using the handrail.   Keep drawers fully closed to prevent them from being climbed on or pulled out entirely.   Move chairs, cribs, beds and other furniture away from windows.   Consider installing window guards on windows ground floor and up, unless they are emergency fire exits. Make sure they have easy release mechanisms.   Consider installing special locks that only allow the window to be opened to a certain height.   Never rely on window screens to prevent falls.   Never leave babies alone on changing tables, beds or sofas. Use a changing table that has a restraining strap.   When a child can pull to a standing position, the crib mattress should be adjusted to its lowest position. There should be at least 26 inches between the top rails of the crib drop side and the mattress. Toys, bumper pads, and other objects that can be used as steps to climb out should be removed from the crib.   On bunk beds never allow a child under age 16 to sleep on the top bunk. For older children, if the upper bunk is not against a wall, use guard rails on both sides. No matter how old a child is, keep  the guard rails in place on the top bunk since children roll during sleep. Do not permit horseplay on bunks.   Grass and soil surfaces beneath backyard playground equipment should be replaced with hardwood chips, shredded wood mulch, sand, pea gravel, rubber, crushed stone, or another safer material at depths of at least 9 to 12 inches.   When riding bikes or using skates, skateboards, skis, or snowboards, require children to wear helmets. Look for those that have stickers stating that they meet or exceed safety standards.   Vertical posts or pickets in deck, balcony, and stairway railings should be no more than 3 1/2 inches apart if a young baby will have access to the area. The space between horizontal rails or bars, and between the floor  and the first horizontal rail or bar, should be no more than 3 1/2 inches.  Document Released: 08/28/2002 Document Revised: 08/27/2011 Document Reviewed: 06/27/2009 [facility] Patient Information 2012 [place], [place].

## 2011-12-22 LAB — CBC WITH DIFFERENTIAL/PLATELET
Basophils Absolute: 0 K/uL (ref 0.0–0.1)
Basophils Relative: 1 % (ref 0–1)
Eosinophils Absolute: 0.1 K/uL (ref 0.0–0.7)
Eosinophils Relative: 2 % (ref 0–5)
HCT: 44.8 % (ref 39.0–52.0)
Hemoglobin: 13.7 g/dL (ref 13.0–17.0)
Lymphocytes Relative: 28 % (ref 12–46)
Lymphs Abs: 2.3 K/uL (ref 0.7–4.0)
MCH: 27.6 pg (ref 26.0–34.0)
MCHC: 30.6 g/dL (ref 30.0–36.0)
MCV: 90.3 fL (ref 78.0–100.0)
Monocytes Absolute: 0.6 K/uL (ref 0.1–1.0)
Monocytes Relative: 7 % (ref 3–12)
Neutro Abs: 5.3 K/uL (ref 1.7–7.7)
Neutrophils Relative %: 63 % (ref 43–77)
Platelets: 172 K/uL (ref 150–400)
RBC: 4.96 MIL/uL (ref 4.22–5.81)
RDW: 15 % (ref 11.5–15.5)
WBC: 8.4 K/uL (ref 4.0–10.5)

## 2011-12-22 LAB — BASIC METABOLIC PANEL WITH GFR
BUN: 15 mg/dL (ref 6–23)
CO2: 27 meq/L (ref 19–32)
Calcium: 9.8 mg/dL (ref 8.4–10.5)
Chloride: 103 meq/L (ref 96–112)
Creat: 1.23 mg/dL (ref 0.50–1.35)
Glucose, Bld: 99 mg/dL (ref 70–99)
Potassium: 5 meq/L (ref 3.5–5.3)
Sodium: 138 meq/L (ref 135–145)

## 2011-12-22 LAB — TSH: TSH: 2.666 u[IU]/mL (ref 0.350–4.500)

## 2011-12-22 NOTE — Progress Notes (Signed)
Quick Note:  All labs are normal. ______ 

## 2011-12-24 ENCOUNTER — Encounter: Payer: Self-pay | Admitting: Family Medicine

## 2011-12-24 ENCOUNTER — Ambulatory Visit (INDEPENDENT_AMBULATORY_CARE_PROVIDER_SITE_OTHER): Payer: Medicare Other | Admitting: Family Medicine

## 2011-12-24 ENCOUNTER — Other Ambulatory Visit: Payer: Self-pay | Admitting: Cardiovascular Disease

## 2011-12-24 VITALS — BP 150/80 | Ht 69.0 in | Wt 197.0 lb

## 2011-12-24 DIAGNOSIS — L0390 Cellulitis, unspecified: Secondary | ICD-10-CM

## 2011-12-24 DIAGNOSIS — A4902 Methicillin resistant Staphylococcus aureus infection, unspecified site: Secondary | ICD-10-CM

## 2011-12-24 DIAGNOSIS — L0291 Cutaneous abscess, unspecified: Secondary | ICD-10-CM

## 2011-12-24 MED ORDER — SULFAMETHOXAZOLE-TRIMETHOPRIM 800-160 MG PO TABS: 1.0000 | ORAL_TABLET | Freq: Two times a day (BID) | ORAL | Status: AC

## 2011-12-24 NOTE — Progress Notes (Signed)
  Subjective:    Patient ID: [name] [name], male    DOB: Mar 11, 1941, 71 y.o.   MRN: [id number]  HPI He was treated for a MRSA abscess over the left eye approximately 3 weeks ago. He has a new area popping out over his right eyebrow it started about 2 days ago. He wanted to come in early. If these have not ectatic. He did start using the Bactroban in his nose this week. The last abscess 2 culture positive for MRSA.    Review of Systems     Objective:   Physical Exam  Constitutional: He appears well-developed.  Skin:       Small pustule on the right eyebrow. approx 0.5 cm in size.            Assessment & Plan:  MRSA abscess, early - Will tx with warm compresses and Bactrim DS bid x 10- days. Can start applying the bactroban ointment. Please call if not resolving in 4-5 days or is getting larger and spreading or fever. Given infor on MRSA.  Start hibiclens scrub twice a week and continue bactroban ointment to nose to keep colonization down.

## 2011-12-24 NOTE — Patient Instructions (Signed)
Get Hibiclens soap to srub body with twice a week, Make sure to scrub under the nails really well.  Clean razor with alcohol.   Community-Associated MRSA CA-MRSA stands for community-associated methicillin-resistant Staphylococcus aureus. MRSA is a type of bacteria that is resistant to some common antibiotics. It can cause infections in the skin and many other places in the body. Staphylococcus aureus, often called "staph," is a bacteria that normally lives on the skin or in the nose. Staph on the surface of the skin or in the nose does not cause problems. However, if the staph enters the body through a cut, wound, or break in the skin, an infection can happen. Up until recently, infections with the MRSA type of staph mainly occurred in hospitals and other healthcare settings. There are now increasing problems with MRSA infections in the community as well. Infections with MRSA may be very serious or even life-threatening. CA-MRSA is becoming more common. It is known to spread in crowded settings, in jails and prisons, and in situations where there is close skin-to-skin contact, such as during sporting events or in locker rooms. MRSA can be spread through shared items, such as children's toys, razors, towels, or sports equipment.   CAUSES All staph, including MRSA, are normally harmless unless they enter the body through a scratch, cut, or wound, such as with surgery. All staph, including MRSA, can be spread from person-to-person by touching contaminated objects or through direct contact.  MRSA now causes illness in people who have not been in hospitals or other healthcare facilities. Cases of MRSA diseases in the community have been associated with:   Recent antibiotic use.   Sharing contaminated towels or clothes.   Having active skin diseases.   Participating in contact sports.   Living in crowded settings.   Intravenous (IV) drug use.   Community-associated MRSA infections are usually skin  infections, but may cause other severe illnesses.   Staph bacteria are one of the most common causes of skin infection. However, they are also a common cause of pneumonia, bone or joint infections, and bloodstream infections.  DIAGNOSIS Diagnosis of MRSA is done by cultures of fluid samples that may come from:  Swabs taken from cuts or wounds in infected areas.   Nasal swabs.   Saliva or deep cough specimens from the lungs (sputum).   Urine.   Blood.  Many people are "colonized" with MRSA but have no signs of infection. This means that people carry the MRSA germ on their skin or in their nose and may never develop MRSA infection.   TREATMENT   Treatment varies and is based on how serious, how deep, or how extensive the infection is. For example:  Some skin infections, such as a small boil or abscess, may be treated by draining yellowish-white fluid (pus) from the site of the infection.   Deeper or more widespread soft tissue infections are usually treated with surgery to drain pus and with antibiotic medicine given by vein or by mouth. This may be recommended even if you are pregnant.   Serious infections may require a hospital stay.  If antibiotics are given, they may be needed for several weeks. PREVENTION Because many people are colonized with staph, including MRSA, preventing the spread of the bacteria from person-to-person is most important. The best way to prevent the spread of bacteria and other germs is through proper hand washing or by using alcohol-based hand disinfectants. The following are other ways to help prevent MRSA infection within  community settings.    Wash your hands frequently with soap and water for at least 15 seconds. Otherwise, use alcohol-based hand disinfectants when soap and water is not available.   Make sure people who live with you wash their hands often, too.   Do not share personal items. For example, avoid sharing razors and other personal hygiene  items, towels, clothing, and athletic equipment.   Wash and dry your clothes and bedding at the warmest temperatures recommended on the labels.   Keep wounds covered. Pus from infected sores may contain MRSA and other bacteria. Keep cuts and abrasions clean and covered with germ-free (sterile), dry bandages until they are healed.   If you have a wound that appears infected, ask your caregiver if a culture for MRSA and other bacteria should be done.   If you are breastfeeding, talk to your caregiver about MRSA. You may be asked to temporarily stop breastfeeding.  HOME CARE INSTRUCTIONS    Take your antibiotics as directed. Finish them even if you start to feel better.   Avoid close contact with those around you as much as possible. Do not use towels, razors, toothbrushes, bedding, or other items that will be used by others.   To fight the infection, follow your caregiver's instructions for wound care. Wash your hands before and after changing your bandages.   If you have an intravascular device, such as a catheter, make sure you know how to care for it.   Be sure to tell any healthcare providers that you have MRSA so they are aware of your infection.  SEEK IMMEDIATE MEDICAL CARE IF:  The infection appears to be getting worse. Signs include:   Increased warmth, redness, or tenderness around the wound site.   A red line that extends from the infection site.   A dark color in the area around the infection.   Wound drainage that is tan, yellow, or green.   A bad smell coming from the wound.   You feel sick to your stomach (nauseous) and throw up (vomit) or cannot keep medicine down.   You have a fever.   Your baby is older than 3 months with a rectal temperature of 102 F (38.9 C) or higher.   Your baby is 20 months old or younger with a rectal temperature of 100.4 F (38 C) or higher.   You have difficulty breathing.  MAKE SURE YOU:    Understand these instructions.   Will  watch your condition.   Will get help right away if you are not doing well or get worse.  Document Released: 12/11/2005 Document Revised: 08/27/2011 Document Reviewed: 12/11/2010 [facility] Patient Information 2012 [place], [place].

## 2011-12-26 ENCOUNTER — Other Ambulatory Visit: Payer: Self-pay | Admitting: Cardiovascular Disease

## 2012-01-22 ENCOUNTER — Ambulatory Visit: Payer: Medicare Other | Admitting: Family Medicine

## 2012-02-26 ENCOUNTER — Encounter: Payer: Self-pay | Admitting: Family Medicine

## 2012-02-26 ENCOUNTER — Ambulatory Visit (INDEPENDENT_AMBULATORY_CARE_PROVIDER_SITE_OTHER): Payer: Medicare Other | Admitting: Family Medicine

## 2012-02-26 VITALS — BP 160/72 | Ht 69.0 in | Wt 195.0 lb

## 2012-02-26 DIAGNOSIS — E119 Type 2 diabetes mellitus without complications: Secondary | ICD-10-CM

## 2012-02-26 DIAGNOSIS — I10 Essential (primary) hypertension: Secondary | ICD-10-CM

## 2012-02-26 NOTE — Progress Notes (Signed)
  Subjective:    Patient ID: [name] [name], male    DOB: 03-20-1941, 71 y.o.   MRN: [id number]  HPI HTN - No CP or SOB.  Says has been splitting his metoprolol.  Says eats low salt diet. Has been physically active in the yard. He says his blood pressure started to look good sister in the metoprolol in half. He was not having any lows.  DM - HAs some question about medication.   Review of Systems     Objective:   Physical Exam  Constitutional: He is oriented to person, place, and time. He appears well-developed and well-nourished.  HENT:  Head: Normocephalic and atraumatic.  Cardiovascular: Normal rate, regular rhythm and normal heart sounds.   Pulmonary/Chest: Effort normal and breath sounds normal.  Neurological: He is alert and oriented to person, place, and time.  Skin: Skin is warm and dry.  Psychiatric: He has a normal mood and affect. His behavior is normal.          Assessment & Plan:  HTN - Go back up to whole tab on Toprol XL.  Uncontrolled. Followup in one to 2 weeks for nurse visit blood pressure check.  Discussed options for cheaper drugs like [facility], etc.  We can also change his toprol to the tartrate version which may be much less expensive.  Any to schedule a followup appointment had a lesion removed on his chest.

## 2012-04-05 ENCOUNTER — Telehealth: Payer: Self-pay | Admitting: *Deleted

## 2012-04-05 MED ORDER — METOPROLOL TARTRATE 100 MG PO TABS: 100.0000 mg | ORAL_TABLET | Freq: Two times a day (BID) | ORAL | Status: DC

## 2012-04-05 NOTE — Telephone Encounter (Signed)
Pt.notified

## 2012-04-05 NOTE — Telephone Encounter (Signed)
Pt calls and states that the Metoprolol ER is too expensive and wanted to know if you could give him something cheaper- said you all had discussed this at office visit. Please advise

## 2012-04-05 NOTE — Telephone Encounter (Signed)
Ok will change to the twice a day version which is cheaper.

## 2012-04-15 ENCOUNTER — Encounter: Payer: Self-pay | Admitting: Family Medicine

## 2012-04-15 ENCOUNTER — Ambulatory Visit (INDEPENDENT_AMBULATORY_CARE_PROVIDER_SITE_OTHER): Payer: Medicare Other | Admitting: Family Medicine

## 2012-04-15 ENCOUNTER — Other Ambulatory Visit: Payer: Self-pay | Admitting: Family Medicine

## 2012-04-15 VITALS — BP 160/90 | Ht 69.0 in | Wt 196.0 lb

## 2012-04-15 DIAGNOSIS — D239 Other benign neoplasm of skin, unspecified: Secondary | ICD-10-CM

## 2012-04-15 DIAGNOSIS — D229 Melanocytic nevi, unspecified: Secondary | ICD-10-CM

## 2012-04-15 DIAGNOSIS — D235 Other benign neoplasm of skin of trunk: Secondary | ICD-10-CM

## 2012-04-15 MED ORDER — METFORMIN HCL 500 MG PO TABS: 500.0000 mg | ORAL_TABLET | Freq: Two times a day (BID) | ORAL | Status: DC

## 2012-04-15 NOTE — Patient Instructions (Addendum)
Call if any problems.   We will call with the biopsy report in one week.

## 2012-04-15 NOTE — Progress Notes (Signed)
  Subjective:    Patient ID: [name] [name], male    DOB: 1941/05/09, 71 y.o.   MRN: [id number]  HPI Here for biopsy of lesion on his right upper stomach.    Review of Systems     Objective:   Physical Exam        Assessment & Plan:  Shave Biopsy Procedure Note  Pre-operative Diagnosis: atypical nevus  Post-operative Diagnosis: same  Locations:upper right chest.   Indications: larger and atypical shape  Anesthesia: Lidocaine 1% without epinephrine without added sodium bicarbonate  Procedure Details  History of allergy to iodine: no  Patient informed of the risks (including bleeding and infection) and benefits of the  procedure and Verbal informed consent obtained.  The lesion and surrounding area were given a sterile prep using betadyne and draped in the usual sterile fashion. A double blade was used to shave an area of skin approximately 1cm by 1cm.  Hemostasis achieved with alumuninum chloride. Antibiotic ointment and a sterile dressing applied.  The specimen was sent for pathologic examination. The patient tolerated the procedure well.  EBL: 0 ml  Findings: Will send for pathology  Condition: Stable  Complications: none.  Plan: 1. Instructed to keep the wound dry and covered for 24-48h and clean thereafter. 2. Warning signs of infection were reviewed.   3. Recommended that the patient use OTC acetaminophen as needed for pain.  4. Return as needed.

## 2012-04-22 ENCOUNTER — Other Ambulatory Visit: Payer: Self-pay | Admitting: Family Medicine

## 2012-04-22 DIAGNOSIS — D039 Melanoma in situ, unspecified: Secondary | ICD-10-CM | POA: Insufficient documentation

## 2012-05-16 ENCOUNTER — Other Ambulatory Visit: Payer: Self-pay | Admitting: Dermatology

## 2012-05-20 ENCOUNTER — Ambulatory Visit (INDEPENDENT_AMBULATORY_CARE_PROVIDER_SITE_OTHER): Payer: Medicare Other | Admitting: Family Medicine

## 2012-05-20 ENCOUNTER — Encounter: Payer: Self-pay | Admitting: Family Medicine

## 2012-05-20 VITALS — BP 142/70 | HR 72 | Wt 191.0 lb

## 2012-05-20 DIAGNOSIS — L723 Sebaceous cyst: Secondary | ICD-10-CM

## 2012-05-20 NOTE — Progress Notes (Signed)
  Subjective:    Patient ID: [name] [name], male    DOB: 11-May-1941, 71 y.o.   MRN: [id number]  HPI    Review of Systems     Objective:   Physical Exam        Assessment & Plan:   Subjective:     [name] is a 71 y.o. male who presents for evaluation of a subcutaneous nodule located over the neck, left.  This has been present for years years.  There has been pain.  Patient does not have a history of epidermal inclusion cysts.  The following portions of the patient's history were reviewed and updated as appropriate: allergies, current medications and problem list.  Review of Systems Pertinent items are noted in HPI.    Objective:    BP 142/70  Pulse 72  Wt 191 lb (86.637 kg) Physical Exam  Skin: 2 centimeter subcutaneous nodule over the neck. The lesion is fully mobile. There is no erythema.  There is mild tenderness.    Assessment:    Epidermal inclusion cyst of the neck.    Plan:    1. I have discussed treatment options consisting of observation or excision under local anesthesia.   2. Patient is inclined to proceed with excision. 3. Verbal patient instruction given. 4. Follow up as needed for acute illness   Sebaceous Cyst Excision Procedure Note  Pre-operative Diagnosis: Epidermal cyst  Post-operative Diagnosis: same  Locations:left neck  Indications: tender at time  Anesthesia: Lidocaine 1% with epinephrine without added sodium bicarbonate  Procedure Details  History of allergy to iodine: no  Patient informed of the risks (including bleeding and infection) and benefits of the  procedure and Verbal informed consent obtained.  The lesion and surrounding area was given a sterile prep using betadyne . An incision was made over the cyst with a 6 mm punch.  The cyst was filled with typical sebaceous material. The material and cyst wall were extracted. The wound was closed with 5-0 Vicryl using simple interrupted stitches. Antibiotic ointment and a  sterile dressing applied.  The specimen was not sent for pathologic examination. The patient tolerated the procedure well.  EBL: 0.5 ml  Findings: Epidermal cyst  Condition: Stable  Complications: none.  Plan: 1. Instructed to keep the wound dry and covered for 24-48h and clean thereafter. 2. Warning signs of infection were reviewed.   3. Recommended that the patient use OTC acetaminophen as needed for pain.  4. Return for suture removal in 7 days.

## 2012-05-27 ENCOUNTER — Encounter: Payer: Self-pay | Admitting: Family Medicine

## 2012-05-27 ENCOUNTER — Ambulatory Visit: Payer: Medicare Other | Admitting: Family Medicine

## 2012-05-27 VITALS — BP 188/78 | HR 80 | Wt 192.0 lb

## 2012-05-27 DIAGNOSIS — Z4802 Encounter for removal of sutures: Secondary | ICD-10-CM

## 2012-05-27 NOTE — Progress Notes (Signed)
  Subjective:    Patient ID: [name] [name], male    DOB: 1941/06/26, 71 y.o.   MRN: [id number]  HPI Here for suture removal. No active drainage or pus. Non tender. No fever.     Review of Systems     Objective:   Physical Exam        Assessment & Plan:  Suture removal performed. Pt tolerated well. Single suture removed. Bacitirin applied. Recommend apply vaseline daily for one week to improve scaring.

## 2012-07-29 ENCOUNTER — Other Ambulatory Visit: Payer: Self-pay | Admitting: Cardiology

## 2012-07-29 DIAGNOSIS — I6529 Occlusion and stenosis of unspecified carotid artery: Secondary | ICD-10-CM

## 2012-08-02 ENCOUNTER — Ambulatory Visit (INDEPENDENT_AMBULATORY_CARE_PROVIDER_SITE_OTHER): Payer: Medicare Other | Admitting: Family Medicine

## 2012-08-02 ENCOUNTER — Encounter: Payer: Self-pay | Admitting: Family Medicine

## 2012-08-02 VITALS — BP 168/69 | HR 51 | Temp 97.8°F | Ht 71.0 in | Wt 196.0 lb

## 2012-08-02 DIAGNOSIS — J309 Allergic rhinitis, unspecified: Secondary | ICD-10-CM

## 2012-08-02 DIAGNOSIS — N400 Enlarged prostate without lower urinary tract symptoms: Secondary | ICD-10-CM

## 2012-08-02 DIAGNOSIS — J329 Chronic sinusitis, unspecified: Secondary | ICD-10-CM

## 2012-08-02 DIAGNOSIS — I10 Essential (primary) hypertension: Secondary | ICD-10-CM

## 2012-08-02 DIAGNOSIS — Z23 Encounter for immunization: Secondary | ICD-10-CM

## 2012-08-02 DIAGNOSIS — R7301 Impaired fasting glucose: Secondary | ICD-10-CM

## 2012-08-02 DIAGNOSIS — M79671 Pain in right foot: Secondary | ICD-10-CM

## 2012-08-02 DIAGNOSIS — J302 Other seasonal allergic rhinitis: Secondary | ICD-10-CM

## 2012-08-02 DIAGNOSIS — M79609 Pain in unspecified limb: Secondary | ICD-10-CM

## 2012-08-02 MED ORDER — FLUTICASONE PROPIONATE 50 MCG/ACT NA SUSP: 2.0000 | Freq: Every day | NASAL | Status: DC

## 2012-08-02 MED ORDER — DOXAZOSIN MESYLATE 4 MG PO TABS: 4.0000 mg | ORAL_TABLET | Freq: Every day | ORAL | Status: DC

## 2012-08-02 NOTE — Progress Notes (Signed)
  Subjective:    Patient ID: [name] [name], male    DOB: Dec 16, 1940, 71 y.o.   MRN: [id number]  HPI Pain in right foot- Dropped 50 lb roll of rope and now has bruising. It is sore but able to walk without sig pain. It is still tender and swollen there. He did want me to look at it.  Wants flu shot today.  He has been urinating more frequently at night. Previously he was waking up every 3-4 hours to urinate now is waking up every 2 hours. This is been gradual and has not been abrupt. He denies any pain with urination or discomfort. He does take a lot of fluids in the evening and this has not changed. He has been taking his Cardura nightly. He is taking 2 mg.  Hypertension-no chest pain or short of breath. Taking medications regularly. He has not followed up for his diabetes in quite some time.  Review of Systems     Objective:   Physical Exam  Constitutional: He is oriented to person, place, and time. He appears well-developed and well-nourished.  HENT:  Head: Normocephalic and atraumatic.  Cardiovascular: Normal rate, regular rhythm and normal heart sounds.   Pulmonary/Chest: Effort normal and breath sounds normal.  Musculoskeletal:       He has pain and bruising over the distal right foot. He is able to move all toes and ankle without difficulty. He is tender to touch directly under the bruising. He is able to walk without difficulty or abnormal gait.  Neurological: He is alert and oriented to person, place, and time.  Skin: Skin is warm and dry.  Psychiatric: He has a normal mood and affect. His behavior is normal.          Assessment & Plan:  Foot pain -I offered to do an x-ray just to rule out fracture since she did have a 50 pound weight land on his foot. He declined at this time. I encouraged him to let me know if after couple weeks she still having significant swelling or tenderness or pain.  BPH -AUA score is 6 today, which is mild. He rates his quality of life as mostly  satisfied. we discussed we could certainly increase his doxazosin. This would help not only his prostate symptoms but his blood pressure as well.  DM- he is overdue for diabetic followup to assess him to schedule this in the next month.  Hypertension-uncontrolled today. Hopefully increasing his doxazosin will improve his blood pressure. If not under control when I see him back in 3-4 weeks for his diabetes then we may need to consider adding amlodipine.  Flu vaccine given.

## 2012-08-03 ENCOUNTER — Telehealth: Payer: Self-pay | Admitting: *Deleted

## 2012-08-04 NOTE — Telephone Encounter (Signed)
See other message

## 2012-08-05 ENCOUNTER — Encounter (INDEPENDENT_AMBULATORY_CARE_PROVIDER_SITE_OTHER): Payer: Medicare Other

## 2012-08-05 ENCOUNTER — Ambulatory Visit (INDEPENDENT_AMBULATORY_CARE_PROVIDER_SITE_OTHER): Payer: Medicare Other | Admitting: Cardiovascular Disease

## 2012-08-05 ENCOUNTER — Encounter: Payer: Self-pay | Admitting: Cardiovascular Disease

## 2012-08-05 VITALS — BP 142/72 | HR 51 | Ht 71.0 in | Wt 195.6 lb

## 2012-08-05 DIAGNOSIS — I10 Essential (primary) hypertension: Secondary | ICD-10-CM

## 2012-08-05 DIAGNOSIS — E7800 Pure hypercholesterolemia, unspecified: Secondary | ICD-10-CM

## 2012-08-05 DIAGNOSIS — I6529 Occlusion and stenosis of unspecified carotid artery: Secondary | ICD-10-CM

## 2012-08-05 DIAGNOSIS — I2510 Atherosclerotic heart disease of native coronary artery without angina pectoris: Secondary | ICD-10-CM

## 2012-08-05 DIAGNOSIS — I779 Disorder of arteries and arterioles, unspecified: Secondary | ICD-10-CM

## 2012-08-05 NOTE — Patient Instructions (Signed)
Your physician wants you to follow-up in:  6 MONTHS WITH DR NISHAN  You will receive a reminder letter in the mail two months in advance. If you don't receive a letter, please call our office to schedule the follow-up appointment. Your physician recommends that you continue on your current medications as directed. Please refer to the Current Medication list given to you today. 

## 2012-08-05 NOTE — Progress Notes (Signed)
Patient ID: TIVIS DESANCTIS, male   DOB: December 22, 1940, 71 y.o.   MRN: [id number] [name] [name] is seen today in F/U for CAD, HTN and elevated chol. Has a friend from [facility] living with him now since his wife died. Still selling rope. I believe he's had a stent to the OM in 96 and subsequent intervention to LAD. 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 He is not having any SSCP, palpitatoins SOB or edema. He has been compliant with his meds. His LDL has been under 100 with normal LFT's He has moderate carotid disease Reviewed duplex from today and stable  Left ICA 60-79% RICA 40-59%  Recent skin lesion removed from upper abdomen and healing well  ROS: Denies fever, malais, weight loss, blurry vision, decreased visual acuity, cough, sputum, SOB, hemoptysis, pleuritic pain, palpitaitons, heartburn, abdominal pain, melena, lower extremity edema, claudication, or rash.  All other systems reviewed and negative  General: Affect appropriate Healthy:  appears stated age HEENT: normal Neck supple with no adenopathy JVP normal right  bruits no thyromegaly Lungs clear with no wheezing and good diaphragmatic motion Heart:  S1/S2 no murmur, no rub, gallop or click PMI normal Abdomen: benighn, BS positve, no tenderness, no AAA recetn mole excision no bruit.  No HSM or HJR Distal pulses intact with no bruits No edema Neuro non-focal Skin warm and dry No muscular weakness   Current Outpatient Prescriptions  Medication Sig Dispense Refill  . aspirin 325 MG tablet Take 325 mg by mouth daily.        . clopidogrel (PLAVIX) 75 MG tablet take 1 tablet by mouth daily  90 tablet  4  . doxazosin (CARDURA) 4 MG tablet Take 1 tablet (4 mg total) by mouth at bedtime.  90 tablet  1  . fluticasone (FLONASE) 50 MCG/ACT nasal spray Place 2 sprays into the nose daily.  16 g  2  . lisinopril (PRINIVIL,ZESTRIL) 40 MG tablet Take 40 mg by mouth daily.      Marland Kitchen losartan (COZAAR) 100 MG tablet Take 1 tablet (100 mg  total) by mouth daily.  90 tablet  3  . metFORMIN (GLUCOPHAGE) 500 MG tablet Take 1 tablet (500 mg total) by mouth 2 (two) times daily with a meal.  60 tablet  11  . metoprolol (LOPRESSOR) 100 MG tablet Take 1 tablet (100 mg total) by mouth 2 (two) times daily.  60 tablet  6  . simvastatin (ZOCOR) 80 MG tablet take 1/2 tablet by mouth at bedtime  45 tablet  3    Allergies  Review of patient's allergies indicates no known allergies.  Electrocardiogram:  NSR rate 51 normal   Assessment and Plan

## 2012-08-05 NOTE — Assessment & Plan Note (Signed)
Stable with no angina and good activity level.  Continue medical Rx  

## 2012-08-05 NOTE — Assessment & Plan Note (Signed)
Stable F/U duplex in 6 months  ASA

## 2012-08-05 NOTE — Assessment & Plan Note (Signed)
Well controlled.  Continue current medications and low sodium Dash type diet.    

## 2012-08-05 NOTE — Assessment & Plan Note (Signed)
Cholesterol is at goal.  Continue current dose of statin and diet Rx.  No myalgias or side effects.  F/U  LFT's in 6 months. Lab Results  Component Value Date   LDLCALC 42 05/29/2011             

## 2012-08-08 ENCOUNTER — Other Ambulatory Visit: Payer: Self-pay | Admitting: Dermatology

## 2012-08-22 ENCOUNTER — Telehealth: Payer: Self-pay

## 2012-08-22 NOTE — Telephone Encounter (Signed)
[name] [name] states he has nasal drainage and drainage down his throat. He also complains of sore throat. He wants something called in to [facility]. [name] [name] also states he has had medication called in before without being seen. Not sure of the protocol, please advise. Denies fever, chills or sweats.

## 2012-08-22 NOTE — Telephone Encounter (Signed)
Needs appt

## 2012-08-22 NOTE — Telephone Encounter (Signed)
FYI  Patient refused and appointment. He will call back if needed to schedule an appointment.

## 2012-12-19 ENCOUNTER — Ambulatory Visit (INDEPENDENT_AMBULATORY_CARE_PROVIDER_SITE_OTHER): Payer: Medicare Other | Admitting: Family Medicine

## 2012-12-19 ENCOUNTER — Encounter: Payer: Self-pay | Admitting: Family Medicine

## 2012-12-19 VITALS — BP 142/78 | HR 46 | Wt 202.0 lb

## 2012-12-19 DIAGNOSIS — R350 Frequency of micturition: Secondary | ICD-10-CM

## 2012-12-19 DIAGNOSIS — S46211A Strain of muscle, fascia and tendon of other parts of biceps, right arm, initial encounter: Secondary | ICD-10-CM

## 2012-12-19 DIAGNOSIS — M25519 Pain in unspecified shoulder: Secondary | ICD-10-CM

## 2012-12-19 DIAGNOSIS — N400 Enlarged prostate without lower urinary tract symptoms: Secondary | ICD-10-CM

## 2012-12-19 DIAGNOSIS — S46819A Strain of other muscles, fascia and tendons at shoulder and upper arm level, unspecified arm, initial encounter: Secondary | ICD-10-CM

## 2012-12-19 DIAGNOSIS — M25511 Pain in right shoulder: Secondary | ICD-10-CM

## 2012-12-19 LAB — POCT URINALYSIS DIPSTICK
Leukocytes, UA: NEGATIVE
Protein, UA: >=300
Urobilinogen, UA: 1
pH, UA: 6

## 2012-12-19 MED ORDER — PREDNISONE 10 MG PO TABS: 10.0000 mg | ORAL_TABLET | Freq: Every day | ORAL | Status: DC

## 2012-12-19 NOTE — Progress Notes (Signed)
  Subjective:    Patient ID: [name] [name], male    DOB: 01/24/1941, 72 y.o.   MRN: [id number]  HPI BPH - Has been urinating every 2 hours esp at night.  Seem worse over the last  6 month. Still on cardura.  No dysuria, fever or back pain.  Last PSA in November.  Former smoker.  Lab Results  Component Value Date   PSA 0.06 08/02/2012   PSA 0.08 05/29/2011   PSA 0.06* 11/25/2009     Right shoulder pain x 3-4 weeks. Immediates pain and injury when lifted his dog. Says it was a sharp popping pain.  Says it was bruised as well. That has cleared it.  Pain is much bettter than it was.  Says when moves a certain way it will hurt. He doesn't want this to interfere with his gardening.  No locking, popping, or clicking.  No meds for pain.  No old injuries or surgeries on that shoulder.    Review of Systems     Objective:   Physical Exam  Constitutional: He is oriented to person, place, and time. He appears well-developed and well-nourished.  Musculoskeletal:  Right shoulder with normal range of motion. Strength is 5 out of 5 in all directions. His biceps since presenting a little lower compared to his left is bulging a little bit more. I really think he may have torn his biceps tendon. Possibly a complete tear but may be just a partial.  Neurological: He is alert and oriented to person, place, and time.  Skin: Skin is warm and dry.  Psychiatric: He has a normal mood and affect. His behavior is normal.          Assessment & Plan:  BPH  - UA is + for protein and Blood.  Possible urinary tract infection versus worsening of his BPH. He's currently on alpha blocker. We may need to add a second agent for better control. If the urine culture is positive we will treat him for UTI. If it's negative then I want to repeat a sample to make sure that the blood and protein have cleared. He is a former smoker so is the second urinalysis is positive then he will need cystoscopy for further evaluation to rule out  bladder tumor.  Urinary frequency - will send urine for culture to rule out urinary tract infection. Ms. likely related to his enlarged prostate gland.  Right shouler pain - I think tore bicep tendon.  Not sure if partial or complete tear. Would like for him to see my partner Dr. Rodney Langton sometime this week. He is leaving town tomorrow but will be back on Friday. He is actually not having any significant pain. Typically is only with certain motions. He is not interested in taking a pain reliever or anti-inflammatory at this point in time. We discussed that with further evaluation he can decide if it is going to be something he wants to live with or possibly have it surgically repaired.

## 2012-12-20 LAB — URINALYSIS, MICROSCOPIC ONLY
Bacteria, UA: NONE SEEN
Crystals: NONE SEEN

## 2012-12-21 ENCOUNTER — Other Ambulatory Visit: Payer: Self-pay | Admitting: Family Medicine

## 2012-12-21 LAB — URINE CULTURE: Colony Count: NO GROWTH

## 2012-12-21 MED ORDER — FINASTERIDE 5 MG PO TABS: 5.0000 mg | ORAL_TABLET | Freq: Every day | ORAL | Status: DC

## 2012-12-23 ENCOUNTER — Ambulatory Visit (INDEPENDENT_AMBULATORY_CARE_PROVIDER_SITE_OTHER): Payer: Medicare Other | Admitting: Sports Medicine

## 2012-12-23 ENCOUNTER — Encounter: Payer: Self-pay | Admitting: Sports Medicine

## 2012-12-23 VITALS — BP 127/60 | HR 52 | Wt 202.0 lb

## 2012-12-23 DIAGNOSIS — S43499A Other sprain of unspecified shoulder joint, initial encounter: Secondary | ICD-10-CM

## 2012-12-23 DIAGNOSIS — S46111A Strain of muscle, fascia and tendon of long head of biceps, right arm, initial encounter: Secondary | ICD-10-CM

## 2012-12-23 DIAGNOSIS — S46119A Strain of muscle, fascia and tendon of long head of biceps, unspecified arm, initial encounter: Secondary | ICD-10-CM | POA: Insufficient documentation

## 2012-12-23 DIAGNOSIS — M719 Bursopathy, unspecified: Secondary | ICD-10-CM

## 2012-12-23 DIAGNOSIS — M67919 Unspecified disorder of synovium and tendon, unspecified shoulder: Secondary | ICD-10-CM

## 2012-12-23 DIAGNOSIS — M75101 Unspecified rotator cuff tear or rupture of right shoulder, not specified as traumatic: Secondary | ICD-10-CM

## 2012-12-23 DIAGNOSIS — S46819A Strain of other muscles, fascia and tendons at shoulder and upper arm level, unspecified arm, initial encounter: Secondary | ICD-10-CM

## 2012-12-23 MED ORDER — TRAMADOL HCL 50 MG PO TABS: 50.0000 mg | ORAL_TABLET | Freq: Three times a day (TID) | ORAL | Status: DC | PRN

## 2012-12-23 NOTE — Progress Notes (Signed)
   Subjective:    I'm seeing this patient as a consultation for:  Dr. Linford Arnold  CC: Right shoulder pain  HPI: This is a very pleasant 72 year old male who recently was trying to pull his dog's leash when he felt a pop in the proximal aspect of the anterior part of his right shoulder. He immediately saw a deformity above his elbow. He really didn't have much pain at the time. Currently he denies any pain in the anterior shoulder but still has pain he localizes over the deltoid is worse with overhead activities. He's not had any physical therapy and is only taken aspirin.  Pain is localized, does not radiate past the elbow, denies any numbness or tingling in his hands and denies any neck pain. He has not noted any decrease in his function. . Past medical history, Surgical history, Family history not pertinant except as noted below, Social history, Allergies, and medications have been entered into the medical record, reviewed, and no changes needed.   Review of Systems: No headache, visual changes, nausea, vomiting, diarrhea, constipation, dizziness, abdominal pain, skin rash, fevers, chills, night sweats, weight loss, swollen lymph nodes, body aches, joint swelling, muscle aches, chest pain, shortness of breath, mood changes, visual or auditory hallucinations.   Objective:   General: Well Developed, well nourished, and in no acute distress.  Neuro/Psych: Alert and oriented x3, extra-ocular muscles intact, able to move all 4 extremities, sensation grossly intact. Skin: Warm and dry, no rashes noted.  Respiratory: Not using accessory muscles, speaking in full sentences, trachea midline.  Cardiovascular: Pulses palpable, no extremity edema. Abdomen: Does not appear distended. Right Shoulder: Inspection reveals proximal biceps tendon rupture with a popeye deformity. Palpation is normal with no tenderness over AC joint or bicipital groove. ROM is full in all planes. Rotator cuff strength weak to  abduction. He has excellent strength in elbow flexion, slightly weak to right hand supination.  Positive Neer and Hawkin's tests, empty can sign. Speeds and Yergason's tests normal. No labral pathology noted with negative Obrien's, negative clunk and good stability. Normal scapular function observed. No painful arc and no drop arm sign. No apprehension sign Impression and Recommendations:   This case required medical decision making of moderate complexity.

## 2012-12-23 NOTE — Assessment & Plan Note (Signed)
He is still having some pain he localizes over the deltoid. This is not coming from his biceps tendon, as he does have all the positive impingement signs. I am going to start conservatively with home rehabilitation exercises with a Theraband. Tramadol. He will return in 4 weeks if no better we can consider a subacromial injection.

## 2012-12-23 NOTE — Assessment & Plan Note (Signed)
He does have some mild weakness to supination of the right side. He is pain-free from a biceps perspective.

## 2012-12-26 ENCOUNTER — Other Ambulatory Visit: Payer: Self-pay

## 2012-12-26 MED ORDER — LOSARTAN POTASSIUM 100 MG PO TABS: 100.0000 mg | ORAL_TABLET | Freq: Every day | ORAL | Status: DC

## 2012-12-26 MED ORDER — SIMVASTATIN 80 MG PO TABS: 40.0000 mg | ORAL_TABLET | Freq: Every day | ORAL | Status: DC

## 2013-01-09 ENCOUNTER — Encounter: Payer: Self-pay | Admitting: *Deleted

## 2013-01-09 ENCOUNTER — Other Ambulatory Visit: Payer: Self-pay | Admitting: *Deleted

## 2013-01-09 ENCOUNTER — Telehealth: Payer: Self-pay | Admitting: Cardiovascular Disease

## 2013-01-09 DIAGNOSIS — I6523 Occlusion and stenosis of bilateral carotid arteries: Secondary | ICD-10-CM

## 2013-01-09 NOTE — Telephone Encounter (Signed)
CAROTID SCHEDULED FOR 02-10-13 AT 10:00 AM  .[name] [name]

## 2013-01-09 NOTE — Telephone Encounter (Signed)
New problem    Would like to be set up for cartoid doppler when he come to see Dr. Eden Emms

## 2013-01-09 NOTE — Telephone Encounter (Signed)
LMTCB ./CY 

## 2013-02-10 ENCOUNTER — Encounter (INDEPENDENT_AMBULATORY_CARE_PROVIDER_SITE_OTHER): Payer: Medicare Other

## 2013-02-10 ENCOUNTER — Encounter: Payer: Self-pay | Admitting: Cardiovascular Disease

## 2013-02-10 ENCOUNTER — Ambulatory Visit (INDEPENDENT_AMBULATORY_CARE_PROVIDER_SITE_OTHER): Payer: Medicare Other | Admitting: Cardiovascular Disease

## 2013-02-10 VITALS — BP 130/68 | HR 50 | Ht 71.0 in | Wt 201.0 lb

## 2013-02-10 DIAGNOSIS — I6523 Occlusion and stenosis of bilateral carotid arteries: Secondary | ICD-10-CM

## 2013-02-10 DIAGNOSIS — I779 Disorder of arteries and arterioles, unspecified: Secondary | ICD-10-CM

## 2013-02-10 DIAGNOSIS — I10 Essential (primary) hypertension: Secondary | ICD-10-CM

## 2013-02-10 DIAGNOSIS — E7800 Pure hypercholesterolemia, unspecified: Secondary | ICD-10-CM

## 2013-02-10 DIAGNOSIS — I2510 Atherosclerotic heart disease of native coronary artery without angina pectoris: Secondary | ICD-10-CM

## 2013-02-10 DIAGNOSIS — I6529 Occlusion and stenosis of unspecified carotid artery: Secondary | ICD-10-CM

## 2013-02-10 NOTE — Assessment & Plan Note (Signed)
Stable f/u duplex in 6 months

## 2013-02-10 NOTE — Assessment & Plan Note (Signed)
Well controlled.  Continue current medications and low sodium Dash type diet.    

## 2013-02-10 NOTE — Assessment & Plan Note (Signed)
Cholesterol is at goal.  Continue current dose of statin and diet Rx.  No myalgias or side effects.  F/U  LFT's in 6 months. Lab Results  Component Value Date   LDLCALC 42 05/29/2011

## 2013-02-10 NOTE — Progress Notes (Signed)
Patient ID: [name] [name], male   DOB: [birth year], 72 y.o.   MRN: [id number] Sevrin is seen today in F/U for CAD, HTN and elevated chol. Has a friend from [facility] living with him now since his wife died. Still selling rope. I believe he's had a stent to the OM in 96 and subsequent intervention to LAD. 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 He is not having any SSCP, palpitatoins SOB or edema. He has been compliant with his meds. His LDL has been under 100 with normal LFT's He has moderate carotid disease Reviewed duplex from today and stable   Left ICA 60-79% RICA 0-39%   Recent skin lesion removed from upper abdomen and healing well  ROS: Denies fever, malais, weight loss, blurry vision, decreased visual acuity, cough, sputum, SOB, hemoptysis, pleuritic pain, palpitaitons, heartburn, abdominal pain, melena, lower extremity edema, claudication, or rash.  All other systems reviewed and negative  General: Affect appropriate Healthy:  appears stated age HEENT: normal Neck supple with no adenopathy JVP normal right  bruits no thyromegaly Lungs clear with no wheezing and good diaphragmatic motion Heart:  S1/S2 no murmur, no rub, gallop or click PMI normal Abdomen: benighn, BS positve, no tenderness, no AAA no bruit.  No HSM or HJR Distal pulses intact with no bruits Trace  edema Neuro non-focal Skin warm and dry No muscular weakness   Current Outpatient Prescriptions  Medication Sig Dispense Refill  . aspirin 325 MG tablet Take 325 mg by mouth daily.        . clopidogrel (PLAVIX) 75 MG tablet take 1 tablet by mouth daily  90 tablet  4  . doxazosin (CARDURA) 4 MG tablet Take 1 tablet (4 mg total) by mouth at bedtime.  90 tablet  1  . finasteride (PROSCAR) 5 MG tablet Take 1 tablet (5 mg total) by mouth daily.  30 tablet  3  . fluticasone (FLONASE) 50 MCG/ACT nasal spray Place 2 sprays into the nose daily.  16 g  2  . lisinopril (PRINIVIL,ZESTRIL) 40 MG tablet Take 40 mg  by mouth daily.      Marland Kitchen losartan (COZAAR) 100 MG tablet Take 1 tablet (100 mg total) by mouth daily.  90 tablet  1  . metFORMIN (GLUCOPHAGE) 500 MG tablet Take 1 tablet (500 mg total) by mouth 2 (two) times daily with a meal.  60 tablet  11  . metoprolol (LOPRESSOR) 100 MG tablet Take 1 tablet (100 mg total) by mouth 2 (two) times daily.  60 tablet  6  . predniSONE (DELTASONE) 10 MG tablet Take 1 tablet (10 mg total) by mouth daily.  10 tablet  0  . simvastatin (ZOCOR) 80 MG tablet Take 0.5 tablets (40 mg total) by mouth at bedtime.  45 tablet  1  . traMADol (ULTRAM) 50 MG tablet Take 1 tablet (50 mg total) by mouth every 8 (eight) hours as needed for pain.  50 tablet  2   No current facility-administered medications for this visit.    Allergies  Review of patient's allergies indicates no known allergies.  Electrocardiogram:  07/26/12  SR rate 51 normal   Assessment and Plan

## 2013-02-10 NOTE — Assessment & Plan Note (Signed)
Stable with no angina and good activity level.  Continue medical Rx  

## 2013-02-10 NOTE — Patient Instructions (Signed)
Your physician wants you to follow-up in:   YEAR WITH DR Haywood Filler will receive a reminder letter in the mail two months in advance. If you don't receive a letter, please call our office to schedule the follow-up appointment. Your physician recommends that you continue on your current medications as directed. Please refer to the Current Medication list given to you today. Your physician has requested that you have a carotid duplex. This test is an ultrasound of the carotid arteries in your neck. It looks at blood flow through these arteries that supply the brain with blood. Allow one hour for this exam. There are no restrictions or special instructions. IN 6 MONTHS

## 2013-03-23 ENCOUNTER — Telehealth: Payer: Self-pay | Admitting: Cardiovascular Disease

## 2013-03-23 MED ORDER — CLOPIDOGREL BISULFATE 75 MG PO TABS: ORAL_TABLET | ORAL | Status: DC

## 2013-03-23 NOTE — Telephone Encounter (Signed)
Refill Request     PLAVIX 75 mg

## 2013-03-29 ENCOUNTER — Other Ambulatory Visit: Payer: Self-pay | Admitting: *Deleted

## 2013-03-29 MED ORDER — CLOPIDOGREL BISULFATE 75 MG PO TABS: ORAL_TABLET | ORAL | Status: DC

## 2013-03-30 ENCOUNTER — Other Ambulatory Visit: Payer: Self-pay

## 2013-03-31 ENCOUNTER — Other Ambulatory Visit: Payer: Self-pay | Admitting: Family Medicine

## 2013-04-03 ENCOUNTER — Ambulatory Visit (INDEPENDENT_AMBULATORY_CARE_PROVIDER_SITE_OTHER): Payer: Medicare Other | Admitting: Family Medicine

## 2013-04-03 ENCOUNTER — Encounter: Payer: Self-pay | Admitting: Family Medicine

## 2013-04-03 VITALS — BP 158/70 | HR 50 | Ht 72.0 in | Wt 200.0 lb

## 2013-04-03 DIAGNOSIS — R0602 Shortness of breath: Secondary | ICD-10-CM

## 2013-04-03 DIAGNOSIS — N400 Enlarged prostate without lower urinary tract symptoms: Secondary | ICD-10-CM

## 2013-04-03 DIAGNOSIS — E119 Type 2 diabetes mellitus without complications: Secondary | ICD-10-CM

## 2013-04-03 DIAGNOSIS — M67919 Unspecified disorder of synovium and tendon, unspecified shoulder: Secondary | ICD-10-CM

## 2013-04-03 DIAGNOSIS — I10 Essential (primary) hypertension: Secondary | ICD-10-CM

## 2013-04-03 DIAGNOSIS — M719 Bursopathy, unspecified: Secondary | ICD-10-CM

## 2013-04-03 DIAGNOSIS — M75101 Unspecified rotator cuff tear or rupture of right shoulder, not specified as traumatic: Secondary | ICD-10-CM

## 2013-04-03 LAB — POCT GLYCOSYLATED HEMOGLOBIN (HGB A1C): Hemoglobin A1C: 6.3

## 2013-04-03 LAB — POCT UA - MICROALBUMIN

## 2013-04-03 MED ORDER — DUTASTERIDE 0.5 MG PO CAPS: 0.5000 mg | ORAL_CAPSULE | Freq: Every day | ORAL | Status: DC

## 2013-04-03 NOTE — Addendum Note (Signed)
Addended by: Deno Etienne on: 04/03/2013 05:17 PM   Modules accepted: Orders

## 2013-04-03 NOTE — Patient Instructions (Addendum)
Stop the proscar and take the avodart instead. See if makes a difference.

## 2013-04-03 NOTE — Progress Notes (Signed)
  Subjective:    Patient ID: [name] [name], male    DOB: 05-10-1941, 72 y.o.   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 He's not able to time which movements. Says still has normal ROM. Rarely using any type of pain relievers.  Complains of prostate problems-  she is on Proscar and Cardura.He reports about half the time he has to urinate ADN less than 2 hours after finishing urination. He is also stopping and starting several times. He has found it a little bit more difficult to postpone urination. He also describes a weak urinary stream. He rates his quality of life as 3, mixed.  Has appt with Dr. Marcello Fennel, Urology, in about 3 weeks.  He is having to wake up 3 times at night.    Diabetes-no hyperglycemic events. Her wounds are healing well. Taking medications regularly without side effects or problems.  Hypertension-no chest pain, shortness breath, dizziness. He says typically his blood pressure looks fantastic when he sees a cardiologist but always seems to be high here.  He has noticed a gradual change in his breathing at the last year. He says he gets little bit more short of breath and winded with activities. He thinks (laziness and lack of exercise and activity. No changes for his cough or sputum production. He was a former smoker who smoked for about 50 years. He quit about 10 years ago. Review of Systems     Objective:   Physical Exam  Constitutional: He is oriented to person, place, and time. He appears well-developed and well-nourished.  HENT:  Head: Normocephalic and atraumatic.  Cardiovascular: Normal rate, regular rhythm and normal heart sounds.   Pulmonary/Chest: Effort normal and breath sounds normal.  Musculoskeletal: He exhibits no edema.  Right shoulder with NROM. Nontender over th joint.  Neg empty can test.    Neurological: He is alert and oriented to person, place, and  time.  Skin: Skin is warm and dry.  Psychiatric: He has a normal mood and affect. His behavior is normal.          Assessment & Plan:  Rotator cuff syndrom right shoulder - Will refer to Dr. Karie Schwalbe for subacromial injection. Discussed signs of worsening sxs. Overall he is doing ok.   BPH- score of 16 today on dual therapy. He reports about half the time he has to urinate, and less than 2 hours after finishing urination. He is also stopping and starting several times. He has found it a little bit more difficult to postpone urination. He also describes a weak urinary stream. He rates his quality of life as 3, mixed.  Has appt with Dr. Marcello Fennel in about 3 weeks.   Lab Results  Component Value Date   PSA 0.06 08/02/2012   PSA 0.08 05/29/2011   PSA 0.06* 11/25/2009   DM- A1C is 6.3. Well controlled on metformin.  On statin, ASa, and ACE.   HTN - uncontrolled today. F/U in 1 mo to recheck   Tobacco abuse/shortness of breath-discussed that we really should schedule him for telemetry to evaluate him for COPD. Being a 50 year smoker he probably does have COPD. This could also be in part deconditioning as well and encouraged him to be as active as possible. Schedule spirometry in one month.

## 2013-04-04 ENCOUNTER — Encounter: Payer: Self-pay | Admitting: Family Medicine

## 2013-04-04 DIAGNOSIS — N183 Chronic kidney disease, stage 3 (moderate): Secondary | ICD-10-CM

## 2013-04-14 ENCOUNTER — Other Ambulatory Visit: Payer: Self-pay | Admitting: Family Medicine

## 2013-05-01 ENCOUNTER — Encounter: Payer: Self-pay | Admitting: Family Medicine

## 2013-05-01 ENCOUNTER — Ambulatory Visit (INDEPENDENT_AMBULATORY_CARE_PROVIDER_SITE_OTHER): Payer: Medicare Other | Admitting: Family Medicine

## 2013-05-01 VITALS — BP 145/59 | HR 50 | Ht 71.0 in | Wt 201.0 lb

## 2013-05-01 DIAGNOSIS — J449 Chronic obstructive pulmonary disease, unspecified: Secondary | ICD-10-CM

## 2013-05-01 DIAGNOSIS — I2510 Atherosclerotic heart disease of native coronary artery without angina pectoris: Secondary | ICD-10-CM

## 2013-05-01 MED ORDER — ALBUTEROL SULFATE (2.5 MG/3ML) 0.083% IN NEBU
2.5000 mg | INHALATION_SOLUTION | Freq: Once | RESPIRATORY_TRACT | Status: AC
  Administered 2013-05-01: 2.5 mg via RESPIRATORY_TRACT

## 2013-05-01 NOTE — Progress Notes (Signed)
  Subjective:    Patient ID: [name] [name], [name] [name]    DOB: 1941-07-24, 72 y.o.   MRN: [id number]  HPI CAD -  Pt denies chest pain, SOB, dizziness, or heart palpitations.  Taking meds as directed w/o problems.  Denies medication side effects.   SOB/hx of tobacco use- Gradual SOB over the last few years. No forma dx of COPD. Former 1ppd smoker. Quit in 2008. Here for spirometry today. Has noticed some excess mucous productive.  He doesn't want to take medication for this.  Says has noticed he can't sing at long as he used to. He does report having asthma as a child. He says from the ages of about [age] he really struggled with asthma and it seemed to gradually get better on its own. He does note that it's difficult to carry a note for longer period of time. He says often after an hour of sitting he would get a little winded and has to rest. He says some days he'll notice that he can walk about 500 feet and gets a little bit more short of breath than usual. But other days does fine without having to stop and breathe. He does report some occasional mucus production. No persistent cough or wheeze.   Review of Systems     Objective:   Physical Exam  Constitutional: He appears well-developed and well-nourished.  HENT:  Head: Normocephalic and atraumatic.  Skin: Skin is warm and dry.  Psychiatric: He has a normal mood and affect. His behavior is normal.          Assessment & Plan:  COPD- spirometry today shows an FVC of 80%, FEV1 of 49% with a ratio of 48%. No significant improvement after albuterol. His results are consistent with severe obstruction. Review results with him today in the office. I would like to put him on either Spiriva or tudorza,, as well as a rescue inhaler. He declined to take any type of inhaler. He says right now he doesn't feel like his symptoms are significant enough that he wants to use an inhaler. He will think about it and let me know. I like to recheck his spirometry in 6  months. Certainly if he feels that his symptoms are becoming more prominent I encouraged him to call me sooner rather than later.  CAD- doing well. BP well controlled. On betablocker, ACEi, statin, ASA. He has been asymptomatic. No chest pain.  BPH- working with his urologist he is going to participate in a study for urinary frequency. His urologist stopped hte Avodart.

## 2013-05-02 ENCOUNTER — Encounter: Payer: Self-pay | Admitting: *Deleted

## 2013-05-15 ENCOUNTER — Telehealth: Payer: Self-pay | Admitting: Cardiovascular Disease

## 2013-05-15 NOTE — Telephone Encounter (Signed)
Ok to hold plavix to fix tooth

## 2013-05-15 NOTE — Telephone Encounter (Signed)
New problem   Pt broke a tooth and need a note stating how many days he need to be off of Plavix. Pt stated he haven't taken it today. Please fax to [phone]

## 2013-05-15 NOTE — Telephone Encounter (Signed)
Will forward to Dr Eden Emms for  Recommendations./CY

## 2013-05-15 NOTE — Telephone Encounter (Signed)
WILL FORWARD  NOTE TO  DR Morrison Old .Zack Seal

## 2013-06-17 ENCOUNTER — Other Ambulatory Visit: Payer: Self-pay | Admitting: Family Medicine

## 2013-06-20 ENCOUNTER — Telehealth: Payer: Self-pay | Admitting: Cardiovascular Disease

## 2013-06-20 MED ORDER — SIMVASTATIN 80 MG PO TABS: 40.0000 mg | ORAL_TABLET | Freq: Every day | ORAL | Status: DC

## 2013-06-20 NOTE — Telephone Encounter (Signed)
New Problem]  Medication issue// did not want to be transferred to medications department..   Needs a refill/// states that the last time he was directed to medications it was not handled.

## 2013-06-27 ENCOUNTER — Other Ambulatory Visit: Payer: Self-pay | Admitting: Sports Medicine

## 2013-06-27 ENCOUNTER — Other Ambulatory Visit: Payer: Self-pay

## 2013-06-27 DIAGNOSIS — M75101 Unspecified rotator cuff tear or rupture of right shoulder, not specified as traumatic: Secondary | ICD-10-CM

## 2013-06-27 MED ORDER — LOSARTAN POTASSIUM 100 MG PO TABS: 100.0000 mg | ORAL_TABLET | Freq: Every day | ORAL | Status: DC

## 2013-06-27 MED ORDER — TRAMADOL HCL 50 MG PO TABS: 50.0000 mg | ORAL_TABLET | Freq: Three times a day (TID) | ORAL | Status: DC | PRN

## 2013-07-03 ENCOUNTER — Telehealth: Payer: Self-pay | Admitting: Family Medicine

## 2013-07-03 NOTE — Telephone Encounter (Signed)
Alcario Drought, nurse with [facility] called. She would like to know what medications patient is on also said that patient told her Dr Eppie Gibson wrote a script for his dog?

## 2013-07-03 NOTE — Telephone Encounter (Signed)
Called and informed [name] [name] that OV notes and labs were sent to [phone]. She was inquiring about the prednisone due to if pt has been taking this med he cannot participate in this study because of the side effects and she was wanting more information with regards to this. I told her that I would speak with Dr. Linford Arnold about this and get back with her .[name] [name]

## 2013-07-18 ENCOUNTER — Encounter: Payer: Self-pay | Admitting: Family Medicine

## 2013-07-18 ENCOUNTER — Ambulatory Visit (INDEPENDENT_AMBULATORY_CARE_PROVIDER_SITE_OTHER): Payer: Medicare Other | Admitting: Family Medicine

## 2013-07-18 VITALS — BP 152/68 | HR 59 | Wt 200.0 lb

## 2013-07-18 DIAGNOSIS — J449 Chronic obstructive pulmonary disease, unspecified: Secondary | ICD-10-CM

## 2013-07-18 DIAGNOSIS — Z23 Encounter for immunization: Secondary | ICD-10-CM

## 2013-07-18 DIAGNOSIS — E119 Type 2 diabetes mellitus without complications: Secondary | ICD-10-CM

## 2013-07-18 DIAGNOSIS — I2510 Atherosclerotic heart disease of native coronary artery without angina pectoris: Secondary | ICD-10-CM

## 2013-07-18 DIAGNOSIS — I10 Essential (primary) hypertension: Secondary | ICD-10-CM

## 2013-07-18 MED ORDER — TIOTROPIUM BROMIDE MONOHYDRATE 18 MCG IN CAPS: 18.0000 ug | ORAL_CAPSULE | Freq: Every day | RESPIRATORY_TRACT | Status: DC

## 2013-07-18 MED ORDER — ALBUTEROL SULFATE HFA 108 (90 BASE) MCG/ACT IN AERS: 2.0000 | INHALATION_SPRAY | Freq: Four times a day (QID) | RESPIRATORY_TRACT | Status: DC | PRN

## 2013-07-18 MED ORDER — METOPROLOL TARTRATE 100 MG PO TABS: ORAL_TABLET | ORAL | Status: DC

## 2013-07-18 NOTE — Progress Notes (Signed)
Subjective:    Patient ID: [name] [name], male    DOB: Feb 11, 1941, 72 y.o.   MRN: [id number]  HPI HTN-  Pt denies chest pain, SOB, dizziness, or heart palpitations.  Taking meds as directed w/o problems.  Denies medication side effects.    DM - no hypoglycemic events. No wounds that aren't healing well. Bruise on ankle for 3 weeks.   CAD - No CP, occ sob with his COPD.    COPD-we had done spirometry on him recently and discussed the potential benefit of an inhaler. He wanted to think about it at that time. He is ok starting an inhaler.  Occ notices green or clear mucous. Occ notices sinus pressure but is variable no other URI sxs . No recent flares or exacerbations.    Review of Systems No blurry vision.  + nocturia, hx of BPH.    BP 152/68  Pulse 59  Wt 200 lb (90.719 kg)  BMI 27.91 kg/m2    No Known Allergies  Past Medical History  Diagnosis Date  . CAD (coronary artery disease)   . Bradycardia   . Bruit   . Dyspnea   . Hypercholesteremia     II A  . Hypertension   . Seborrheic keratosis   . Bigeminy   . Claudication   . Allergic rhinitis   . Malignant neoplasm of prostate   . Microalbuminuria   . Diabetes mellitus type II   . History of prostate cancer     Dr. Patsi Sears    Past Surgical History  Procedure Laterality Date  . Coronary angioplasty with stent placement      History   Social History  . Marital Status: Married    Spouse Name: N/A    Number of Children: N/A  . Years of Education: N/A   Occupational History  . Retired but still works Warden/ranger    Social History Main Topics  . Smoking status: Former Smoker -- 1.00 packs/day for 20 years    Quit date: 04/22/2007  . Smokeless tobacco: Not on file  . Alcohol Use: 0.5 oz/week    1 drink(s) per week  . Drug Use: Not on file  . Sexual Activity: Not on file   Other Topics Concern  . Not on file   Social History Narrative   Widowed 08/2009   Has 4 kids, 2 are local   Fair diet   No  regular exercise    Family History  Problem Relation Age of Onset  . Heart attack Father   . Coronary artery disease Brother     Outpatient Encounter Prescriptions as of 07/18/2013  Medication Sig Dispense Refill  . aspirin 325 MG tablet Take 325 mg by mouth daily.        . clopidogrel (PLAVIX) 75 MG tablet take 1 tablet by mouth daily  90 tablet  4  . doxazosin (CARDURA) 4 MG tablet take 1 tablet by mouth at bedtime  90 tablet  1  . losartan (COZAAR) 100 MG tablet Take 1 tablet (100 mg total) by mouth daily.  90 tablet  3  . metFORMIN (GLUCOPHAGE) 500 MG tablet take 1 tablet by mouth twice a day  60 tablet  11  . metoprolol (LOPRESSOR) 100 MG tablet take 1 tablet by mouth twice a day  180 tablet  2  . simvastatin (ZOCOR) 80 MG tablet Take 0.5 tablets (40 mg total) by mouth at bedtime.  45 tablet  6  . [DISCONTINUED] metoprolol (LOPRESSOR)  100 MG tablet take 1 tablet by mouth twice a day  60 tablet  6  . albuterol (PROAIR HFA) 108 (90 BASE) MCG/ACT inhaler Inhale 2 puffs into the lungs every 6 (six) hours as needed for wheezing.  1 Inhaler  1  . tiotropium (SPIRIVA) 18 MCG inhalation capsule Place 1 capsule (18 mcg total) into inhaler and inhale daily.  30 capsule  12  . [DISCONTINUED] fluticasone (FLONASE) 50 MCG/ACT nasal spray Place 2 sprays into the nose daily.  16 g  2  . [DISCONTINUED] lisinopril (PRINIVIL,ZESTRIL) 40 MG tablet Take 40 mg by mouth daily.      . [DISCONTINUED] traMADol (ULTRAM) 50 MG tablet Take 1 tablet (50 mg total) by mouth every 8 (eight) hours as needed for pain.  90 tablet  2   No facility-administered encounter medications on file as of 07/18/2013.          Objective:   Physical Exam  Constitutional: He is oriented to person, place, and time. He appears well-developed and well-nourished.  HENT:  Head: Normocephalic and atraumatic.  Cardiovascular: Normal rate, regular rhythm and normal heart sounds.   Pulmonary/Chest: Effort normal and breath sounds  normal.  Neurological: He is alert and oriented to person, place, and time.  Skin: Skin is warm and dry.  Psychiatric: He has a normal mood and affect. His behavior is normal.          Assessment & Plan:  HTN- repeat blood pressure still elevated. Last time blood pressure was well controlled. Make sure avoiding excess salt and doing regular exercise and watching diet. Repeat at followup if still elevated then we'll need to adjust his medication regimen. He is Re: on losartan 100 mg and metoprolol 100 mg.  DM -on ARB, metformin, statin, and aspirin. Exam, eye exam and urine microalbumin are all up-to-date.  CAD - on aspirin, Plavix, statin, and beta blocker.  COPD- Will start spiriva and given rescue inhaler. No sign of acute infection.  Hx of asthma as a child.  Will give him rescue albuterol inhaler as well.

## 2013-08-01 ENCOUNTER — Encounter: Payer: Self-pay | Admitting: Family Medicine

## 2013-10-08 ENCOUNTER — Other Ambulatory Visit: Payer: Self-pay | Admitting: Family Medicine

## 2013-10-12 ENCOUNTER — Encounter: Payer: Self-pay | Admitting: Family Medicine

## 2013-10-12 ENCOUNTER — Ambulatory Visit (INDEPENDENT_AMBULATORY_CARE_PROVIDER_SITE_OTHER): Payer: Medicare Other | Admitting: Family Medicine

## 2013-10-12 VITALS — BP 140/72 | HR 52 | Temp 97.7°F | Ht 71.0 in | Wt 203.0 lb

## 2013-10-12 DIAGNOSIS — E119 Type 2 diabetes mellitus without complications: Secondary | ICD-10-CM

## 2013-10-12 DIAGNOSIS — N3281 Overactive bladder: Secondary | ICD-10-CM | POA: Insufficient documentation

## 2013-10-12 DIAGNOSIS — I8310 Varicose veins of unspecified lower extremity with inflammation: Secondary | ICD-10-CM

## 2013-10-12 DIAGNOSIS — I872 Venous insufficiency (chronic) (peripheral): Secondary | ICD-10-CM

## 2013-10-12 DIAGNOSIS — I10 Essential (primary) hypertension: Secondary | ICD-10-CM

## 2013-10-12 DIAGNOSIS — N3941 Urge incontinence: Secondary | ICD-10-CM

## 2013-10-12 DIAGNOSIS — N318 Other neuromuscular dysfunction of bladder: Secondary | ICD-10-CM

## 2013-10-12 LAB — COMPLETE METABOLIC PANEL WITH GFR
ALBUMIN: 4.1 g/dL (ref 3.5–5.2)
ALT: 24 U/L (ref 0–53)
AST: 18 U/L (ref 0–37)
Alkaline Phosphatase: 96 U/L (ref 39–117)
BUN: 16 mg/dL (ref 6–23)
CALCIUM: 9 mg/dL (ref 8.4–10.5)
CHLORIDE: 106 meq/L (ref 96–112)
CO2: 28 mEq/L (ref 19–32)
Creat: 1.01 mg/dL (ref 0.50–1.35)
GFR, Est African American: 86 mL/min
GFR, Est Non African American: 74 mL/min
Glucose, Bld: 150 mg/dL — ABNORMAL HIGH (ref 70–99)
Potassium: 4.6 mEq/L (ref 3.5–5.3)
Sodium: 140 mEq/L (ref 135–145)
Total Bilirubin: 0.6 mg/dL (ref 0.3–1.2)
Total Protein: 6.7 g/dL (ref 6.0–8.3)

## 2013-10-12 LAB — POCT GLYCOSYLATED HEMOGLOBIN (HGB A1C): Hemoglobin A1C: 6.3

## 2013-10-12 LAB — LIPID PANEL
Cholesterol: 107 mg/dL (ref 0–200)
HDL: 36 mg/dL — ABNORMAL LOW (ref >39)
LDL CALC: 48 mg/dL (ref 0–99)
TRIGLYCERIDES: 113 mg/dL (ref <150)
Total CHOL/HDL Ratio: 3 Ratio
VLDL: 23 mg/dL (ref 0–40)

## 2013-10-12 MED ORDER — MIRABEGRON ER 50 MG PO TB24: 50.0000 mg | ORAL_TABLET | Freq: Every day | ORAL | Status: DC

## 2013-10-12 MED ORDER — TRIAMCINOLONE ACETONIDE 0.5 % EX OINT: 1.0000 "application " | TOPICAL_OINTMENT | Freq: Every day | CUTANEOUS | Status: DC | PRN

## 2013-10-12 NOTE — Patient Instructions (Addendum)
Can use Zaditor over the counter for allergic itchey eyes.   Try the Myrbetriq daily for 3 weeks and see if you like it.

## 2013-10-12 NOTE — Progress Notes (Signed)
   Subjective:    Patient ID: [name] [name], male    DOB: 1940/10/31, 73 y.o.   MRN: [id number]  HPI Hypertension- Pt denies chest pain, SOB, dizziness, or heart palpitations.  Taking meds as directed w/o problems.  Denies medication side effects.    Diabetes - no hypoglycemic events. No wounds or sores that are not healing well. No increased thirst or urination. Not checking glucose at home. Taking medications as prescribed without any side effects.  Bilat itchey eyes for months.  Says was given samples from eye doctor and says it really helped.   Rash in inner left foot above the ankle x everal months. Occ itchey. No breaks in skin, no in warmth or feve.   Overactive bladder-he sees his urologist, Dr. Gaynelle Arabian regularly. He still getting up to urinate every 1-1/2 hours at night. Occasionally he will notice some frequency during the day as well. He started on doxazosin for prostate. He was in a research study that he started initially this past fall. But then dropped out of the study. They discussed starting him on medication for his overactive bladder but held off at that time because he was enrolled in the study. Review of Systems     Objective:   Physical Exam  Constitutional: He is oriented to person, place, and time. He appears well-developed and well-nourished.  HENT:  Head: Normocephalic and atraumatic.  Cardiovascular: Normal rate, regular rhythm and normal heart sounds.   Pulmonary/Chest: Effort normal and breath sounds normal.  Musculoskeletal:  Trace ankle edema bilaterally. On the left inner lower leg just above the ankle he has a somewhat dark purplish/pink rash with dry scale. Naproxen the skin. No surrounding erythema. He also has some hyperpigmentation of both lower legs.  Nontender on exam  Neurological: He is alert and oriented to person, place, and time.  Skin: Skin is warm and dry.  Psychiatric: He has a normal mood and affect. His behavior is normal.           Assessment & Plan:  HTN - well controlled. Repeat blood pressure was in the normal range. Continue current regimen.  DM- Well controlled. A1C is 6.3.  Continue current regimen continue to work on diet exercise and weight loss. He did get a couple pounds over the holidays encouraged him to work on this.  Alergic eye. - Recommend trial of Zaditor OTC.   Stasis dermatitis  - Tx with topical steorid cream to help with the dry skin and itching. Explained to him that the discoloration of the skin will not improve. Spine and that this is secondary to venous stasis.  Overactive bladder-discussed different options. Recommend a trial of Myrbetriq.  Given samples for 3 weeks. If it's helpful then we can write a prescription for medication and see if it's covered on his formulary. Continue to see urologist regularly.

## 2013-10-12 NOTE — Progress Notes (Signed)
Quick Note:  All labs are normal. ______ 

## 2014-01-01 ENCOUNTER — Telehealth: Payer: Self-pay | Admitting: Cardiovascular Disease

## 2014-01-01 DIAGNOSIS — I779 Disorder of arteries and arterioles, unspecified: Secondary | ICD-10-CM

## 2014-01-01 NOTE — Telephone Encounter (Signed)
New Message  Pt called made recall appt// pt requests a call back to determine if a Carotid is needed for same day. Please assist.

## 2014-01-01 NOTE — Telephone Encounter (Signed)
At pts last Ov with Dr Johnsie Cancel on 02/10/13 Dr Johnsie Cancel noted that the pt would need a f/u Carotid Duplex in 6 months for Carotid disease. I have  ordered the Carotid Duplex and am forwarding note back to Shanterra to call pt to schedule a date and time for Carotid Duplex.

## 2014-01-12 ENCOUNTER — Encounter: Payer: Self-pay | Admitting: Family Medicine

## 2014-01-12 ENCOUNTER — Ambulatory Visit (INDEPENDENT_AMBULATORY_CARE_PROVIDER_SITE_OTHER): Payer: Medicare Other | Admitting: Family Medicine

## 2014-01-12 VITALS — BP 138/82 | HR 62 | Ht 71.0 in | Wt 199.0 lb

## 2014-01-12 DIAGNOSIS — I10 Essential (primary) hypertension: Secondary | ICD-10-CM

## 2014-01-12 DIAGNOSIS — L821 Other seborrheic keratosis: Secondary | ICD-10-CM

## 2014-01-12 DIAGNOSIS — N318 Other neuromuscular dysfunction of bladder: Secondary | ICD-10-CM

## 2014-01-12 DIAGNOSIS — E119 Type 2 diabetes mellitus without complications: Secondary | ICD-10-CM

## 2014-01-12 DIAGNOSIS — IMO0002 Reserved for concepts with insufficient information to code with codable children: Secondary | ICD-10-CM

## 2014-01-12 DIAGNOSIS — N3281 Overactive bladder: Secondary | ICD-10-CM

## 2014-01-12 LAB — POCT GLYCOSYLATED HEMOGLOBIN (HGB A1C): Hemoglobin A1C: 6.5

## 2014-01-12 MED ORDER — DOXAZOSIN MESYLATE 4 MG PO TABS: ORAL_TABLET | ORAL | Status: DC

## 2014-01-12 MED ORDER — SOLIFENACIN SUCCINATE 10 MG PO TABS: 10.0000 mg | ORAL_TABLET | Freq: Every day | ORAL | Status: DC

## 2014-01-12 NOTE — Progress Notes (Signed)
   Subjective:    Patient ID: [name] [name], male    DOB: 1940/11/17, 73 y.o.   MRN: [id number]  HPI Diabetes - no hypoglycemic events. No wounds or sores that are not healing well. No increased thirst or urination. Checking glucose at home. Taking medications as prescribed without any side effects.His cut his leg on the after tripped over a rug at the doc.    Hypertension- Pt denies chest pain, SOB, dizziness, or heart palpitations.  Taking meds as directed w/o problems.  Denies medication side effects.    Also has a seborrheic keratosis on his neck. He says he keeps hitting it with a razor and is worried he is going to cut her cough infection.  Overactive bladder-he felt like  the Myrbetriq really did help relieve his symptoms.  Review of Systems     Objective:   Physical Exam  Constitutional: He is oriented to person, place, and time. He appears well-developed and well-nourished.  HENT:  Head: Normocephalic and atraumatic.  Cardiovascular: Normal rate, regular rhythm and normal heart sounds.   Pulmonary/Chest: Effort normal and breath sounds normal.  Neurological: He is alert and oriented to person, place, and time.  Skin: Skin is warm and dry.  Psychiatric: He has a normal mood and affect. His behavior is normal.    Large dark brown pigmented seborrheic keratosis on the left side of his neck.      Assessment & Plan:  Diabetes-well-controlled. Continue current regimen. Followup in 3 or 4 months. Labs are up-to-date. Lab Results  Component Value Date   HGBA1C 6.5 01/12/2014   Hypertension-well-controlled. Continue current regimen.  Skin laceration on left lower leg just above the ankle. If he notices any poor wound healing or any signs of infection then please let us know.  Seborrheic keratosis-treat with cryotherapy.  Overactive bladder-he felt like  the Myrbetriq really did help relieve his symptoms. Will give him samples of VESIcare 10 mg to take daily as a trial for one  week.  Cryotherapy Procedure Note  Pre-operative Diagnosis: Seborrheic keratosis  Post-operative Diagnosis: Same  Locations: {Left side of neck  Indications: bleeding  Anesthesia: not required    Procedure Details  Patient informed of risks (permanent scarring, infection, light or dark discoloration, bleeding, infection, weakness, numbness and recurrence of the lesion) and benefits of the procedure and verbal informed consent obtained.  The areas are treated with liquid nitrogen therapy, frozen until ice ball extended 2 mm beyond lesion, allowed to thaw, and treated again. The patient tolerated procedure well.  The patient was instructed on post-op care, warned that there may be blister formation, redness and pain. Recommend OTC analgesia as needed for pain.  Condition: Stable  Complications: none.  Plan: 1. Instructed to keep the area dry and covered for 24-48h and clean thereafter. 2. Warning signs of infection were reviewed.   3. Recommended that the patient use OTC acetaminophen as needed for pain.  4. Return prn

## 2014-01-22 ENCOUNTER — Encounter: Payer: Self-pay | Admitting: Family Medicine

## 2014-01-22 ENCOUNTER — Ambulatory Visit (INDEPENDENT_AMBULATORY_CARE_PROVIDER_SITE_OTHER): Payer: Medicare Other | Admitting: Family Medicine

## 2014-01-22 VITALS — BP 142/72 | HR 53 | Wt 205.0 lb

## 2014-01-22 DIAGNOSIS — R1032 Left lower quadrant pain: Secondary | ICD-10-CM

## 2014-01-22 DIAGNOSIS — N3281 Overactive bladder: Secondary | ICD-10-CM

## 2014-01-22 DIAGNOSIS — N318 Other neuromuscular dysfunction of bladder: Secondary | ICD-10-CM

## 2014-01-22 NOTE — Patient Instructions (Signed)
Ayr for your nose.

## 2014-01-22 NOTE — Progress Notes (Signed)
   Subjective:    Patient ID: [name] [name], male    DOB: January 15, 1941, 73 y.o.   MRN: [id number]  HPI Having LLQ pain - some discomfort.  Says has been working hard in the yard so wasn't sure if related or now.  Feels ike something changed about 2-3 months ago. Movement tends to trigger it.  Rates pain as mild. No fever, chills, or sweats.  No change in stools.  No blood in stool. No radiating into the groin or the urine asked him several times to quantify how long he been having the left lower quadrant discomfort and he really wasn't sure.  Overactive bladder-he did try the samples of 10 mg of VESIcare and felt like he always got a partial response. He noticed he was only getting up couple times at night to urinate. He would like to try the samples again before deciding if he wants to fill a prescription. Review of Systems     Objective:   Physical Exam  Constitutional: He is oriented to person, place, and time. He appears well-developed and well-nourished.  HENT:  Head: Normocephalic and atraumatic.  Cardiovascular: Normal rate, regular rhythm and normal heart sounds.   Pulmonary/Chest: Effort normal and breath sounds normal.  Abdominal: Soft. Bowel sounds are normal. He exhibits no distension and no mass. There is no tenderness. There is no rebound and no guarding.  + Diastasis rectus  Neurological: He is alert and oriented to person, place, and time.  Skin: Skin is warm and dry.  Psychiatric: He has a normal mood and affect. His behavior is normal.          Assessment & Plan:  LLQ pain. Unclear etiology. He has no reflux symptoms. No fevers, no abdominal tenderness. No change in bowels. We'll go ahead and check a CBC and CMP today. Just make sure the white count not elevated. Certainly it could be muscle strain. Did not palpate any hernia on exam. If not improving in the next one to 2 weeks and consider imaging for further evaluation.  Overactive bladder-samples given again for  VESIcare 10 mg. I don't think has a generic available yet.

## 2014-01-23 LAB — COMPLETE METABOLIC PANEL WITH GFR
ALT: 28 U/L (ref 0–53)
AST: 21 U/L (ref 0–37)
Albumin: 3.9 g/dL (ref 3.5–5.2)
Alkaline Phosphatase: 93 U/L (ref 39–117)
BILIRUBIN TOTAL: 0.5 mg/dL (ref 0.2–1.2)
BUN: 17 mg/dL (ref 6–23)
CO2: 27 mEq/L (ref 19–32)
Calcium: 9.3 mg/dL (ref 8.4–10.5)
Chloride: 104 mEq/L (ref 96–112)
Creat: 0.96 mg/dL (ref 0.50–1.35)
GFR, Est African American: >89 mL/min
GFR, Est Non African American: 79 mL/min
Glucose, Bld: 133 mg/dL — ABNORMAL HIGH (ref 70–99)
Potassium: 4.2 mEq/L (ref 3.5–5.3)
Sodium: 141 mEq/L (ref 135–145)
Total Protein: 6.7 g/dL (ref 6.0–8.3)

## 2014-01-23 LAB — CBC
HCT: 40.8 % (ref 39.0–52.0)
Hemoglobin: 13.6 g/dL (ref 13.0–17.0)
MCH: 27.8 pg (ref 26.0–34.0)
MCHC: 33.3 g/dL (ref 30.0–36.0)
MCV: 83.4 fL (ref 78.0–100.0)
PLATELETS: 151 10*3/uL (ref 150–400)
RBC: 4.89 MIL/uL (ref 4.22–5.81)
RDW: 15.2 % (ref 11.5–15.5)
WBC: 8.7 10*3/uL (ref 4.0–10.5)

## 2014-02-05 ENCOUNTER — Ambulatory Visit (INDEPENDENT_AMBULATORY_CARE_PROVIDER_SITE_OTHER): Payer: Medicare Other | Admitting: Family Medicine

## 2014-02-05 ENCOUNTER — Encounter: Payer: Self-pay | Admitting: Family Medicine

## 2014-02-05 VITALS — BP 140/62 | HR 62 | Wt 199.0 lb

## 2014-02-05 DIAGNOSIS — R1032 Left lower quadrant pain: Secondary | ICD-10-CM

## 2014-02-05 NOTE — Progress Notes (Signed)
   Subjective:    Patient ID: [name] [name], male    DOB: 1940-12-11, 73 y.o.   MRN: [id number]  HPI Followup left lower quadrant pain. No fever, or change in pain. Rates it a mild. Not painful with waliing but some pain with sitting up or changing position. No diarrhea or constipation. No nausea or vomiting. He says that he does have times where completely goes away but then recurs. Pain does not change locations or radiate. His been going on for him is 3 months at this point in time. No change in caliber stools.   Review of Systems     Objective:   Physical Exam  Constitutional: He appears well-developed and well-nourished.  HENT:  Head: Normocephalic and atraumatic.  Abdominal: Soft. Bowel sounds are normal. He exhibits no distension and no mass. There is no tenderness. There is no rebound and no guarding.  Skin: Skin is warm and dry.  Psychiatric: He has a normal mood and affect. His behavior is normal.          Assessment & Plan:  Left lower quit her pain-will schedule for CT scan abdomen and pelvis this is been going on for this long and he has tried conservative therapy without improvement in his symptoms. He has no red flag symptoms which is somewhat reassuring. He does have a history of prostate cancer. He also has a history of melanoma.   Call cell phone.  Schedule next Monday.

## 2014-02-13 ENCOUNTER — Telehealth: Payer: Self-pay | Admitting: *Deleted

## 2014-02-13 NOTE — Telephone Encounter (Signed)
PA obtained for CT ABD/Pelvis w/contrast. Auth # [id number]. Exp. 03/26/14.  Oscar La, LPN

## 2014-02-19 ENCOUNTER — Other Ambulatory Visit (HOSPITAL_COMMUNITY): Payer: Self-pay | Admitting: Cardiology

## 2014-02-19 ENCOUNTER — Other Ambulatory Visit: Payer: Self-pay | Admitting: Dermatology

## 2014-02-19 DIAGNOSIS — I6529 Occlusion and stenosis of unspecified carotid artery: Secondary | ICD-10-CM

## 2014-02-23 ENCOUNTER — Encounter (HOSPITAL_COMMUNITY): Payer: Medicare Other

## 2014-02-23 ENCOUNTER — Encounter (INDEPENDENT_AMBULATORY_CARE_PROVIDER_SITE_OTHER): Payer: Self-pay

## 2014-02-23 ENCOUNTER — Ambulatory Visit: Payer: Medicare Other | Admitting: Cardiovascular Disease

## 2014-02-23 ENCOUNTER — Ambulatory Visit (HOSPITAL_BASED_OUTPATIENT_CLINIC_OR_DEPARTMENT_OTHER)
Admission: RE | Admit: 2014-02-23 | Discharge: 2014-02-23 | Disposition: A | Discharge status: Home / Self Care | Payer: Medicare Other | Source: Ambulatory Visit | Attending: Family Medicine | Admitting: Family Medicine

## 2014-02-23 DIAGNOSIS — R1032 Left lower quadrant pain: Secondary | ICD-10-CM

## 2014-02-23 DIAGNOSIS — M51379 Other intervertebral disc degeneration, lumbosacral region without mention of lumbar back pain or lower extremity pain: Secondary | ICD-10-CM | POA: Insufficient documentation

## 2014-02-23 DIAGNOSIS — K5730 Diverticulosis of large intestine without perforation or abscess without bleeding: Secondary | ICD-10-CM | POA: Insufficient documentation

## 2014-02-23 DIAGNOSIS — M5137 Other intervertebral disc degeneration, lumbosacral region: Secondary | ICD-10-CM | POA: Insufficient documentation

## 2014-02-23 DIAGNOSIS — K8020 Calculus of gallbladder without cholecystitis without obstruction: Secondary | ICD-10-CM | POA: Insufficient documentation

## 2014-02-23 MED ORDER — IOHEXOL 300 MG/ML  SOLN
100.0000 mL | Freq: Once | INTRAMUSCULAR | Status: AC | PRN
  Administered 2014-02-23: 100 mL via INTRAVENOUS

## 2014-02-23 MED ORDER — IOHEXOL 300 MG/ML  SOLN: 100.0000 mL | Freq: Once | INTRAMUSCULAR | Status: AC | PRN

## 2014-02-26 ENCOUNTER — Ambulatory Visit (INDEPENDENT_AMBULATORY_CARE_PROVIDER_SITE_OTHER): Payer: Medicare Other | Admitting: Cardiovascular Disease

## 2014-02-26 ENCOUNTER — Telehealth: Payer: Self-pay | Admitting: *Deleted

## 2014-02-26 ENCOUNTER — Ambulatory Visit (HOSPITAL_COMMUNITY): Payer: Medicare Other | Attending: Cardiovascular Disease | Admitting: Cardiology

## 2014-02-26 VITALS — BP 162/66 | HR 66 | Ht 72.0 in | Wt 201.4 lb

## 2014-02-26 DIAGNOSIS — E119 Type 2 diabetes mellitus without complications: Secondary | ICD-10-CM | POA: Insufficient documentation

## 2014-02-26 DIAGNOSIS — I10 Essential (primary) hypertension: Secondary | ICD-10-CM

## 2014-02-26 DIAGNOSIS — I6529 Occlusion and stenosis of unspecified carotid artery: Secondary | ICD-10-CM

## 2014-02-26 DIAGNOSIS — I779 Disorder of arteries and arterioles, unspecified: Secondary | ICD-10-CM

## 2014-02-26 DIAGNOSIS — E785 Hyperlipidemia, unspecified: Secondary | ICD-10-CM | POA: Insufficient documentation

## 2014-02-26 DIAGNOSIS — I2510 Atherosclerotic heart disease of native coronary artery without angina pectoris: Secondary | ICD-10-CM | POA: Insufficient documentation

## 2014-02-26 DIAGNOSIS — Z87891 Personal history of nicotine dependence: Secondary | ICD-10-CM | POA: Insufficient documentation

## 2014-02-26 DIAGNOSIS — E7800 Pure hypercholesterolemia, unspecified: Secondary | ICD-10-CM

## 2014-02-26 DIAGNOSIS — I739 Peripheral vascular disease, unspecified: Secondary | ICD-10-CM

## 2014-02-26 DIAGNOSIS — I658 Occlusion and stenosis of other precerebral arteries: Secondary | ICD-10-CM | POA: Insufficient documentation

## 2014-02-26 NOTE — Assessment & Plan Note (Signed)
Discussed low carb diet.  Target hemoglobin A1c is 6.5 or less.  Continue current medications.  

## 2014-02-26 NOTE — Patient Instructions (Signed)
Your physician wants you to follow-up in:  6 MONTHS WITH DR NISHAN  You will receive a reminder letter in the mail two months in advance. If you don't receive a letter, please call our office to schedule the follow-up appointment. Your physician recommends that you continue on your current medications as directed. Please refer to the Current Medication list given to you today. 

## 2014-02-26 NOTE — Telephone Encounter (Signed)
Pt informed of results. He stated that he has had green mucus that drains esp at night and during the day when he blows his nose. Reports that this is like what he had last year and was given an rx for it. He thinks that he was given Abx and would like for this to be sent to his pharmacy. He does not have any fevers or chills.[name]

## 2014-02-26 NOTE — Assessment & Plan Note (Signed)
Stable with no angina and good activity level.  Continue medical Rx  

## 2014-02-26 NOTE — Assessment & Plan Note (Signed)
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  No TIA symptoms.  Continue antiplatelet Rx and F/U carotid duplex in 6 months   

## 2014-02-26 NOTE — Progress Notes (Signed)
Patient ID: [name] [name], male   DOB: 1941-06-14, 73 y.o.   MRN: [id number] [name] [name] is seen today in F/U for CAD, HTN and elevated chol. Has a friend from [facility] living with him now since his wife died. Still selling rope. I believe he's had a stent to the OM in 96 and subsequent intervention to LAD. 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 He is not having any SSCP, palpitatoins SOB or edema. He has been compliant with his meds. His LDL has been under 100 with normal LFT's He has moderate carotid disease   Reviewed duplex from today and stable  Left ICA 60-79% RICA 0-39%   Has chronic venous insuf with occasional skin breakdown at ankles  Wants handicap placard    ROS: Denies fever, malais, weight loss, blurry vision, decreased visual acuity, cough, sputum, SOB, hemoptysis, pleuritic pain, palpitaitons, heartburn, abdominal pain, melena, lower extremity edema, claudication, or rash.  All other systems reviewed and negative  General: Affect appropriate Healthy:  appears stated age 73: normal Neck supple with no adenopathy JVP normal no bruits no thyromegaly Lungs clear with no wheezing and good diaphragmatic motion Heart:  S1/S2 no murmur, no rub, gallop or click PMI normal Abdomen: benighn, BS positve, no tenderness, no AAA no bruit.  No HSM or HJR Distal pulses intact with no bruits Trace bilateral edema with venouls insuf and erythema medial malleolus  Neuro non-focal Skin warm and dry No muscular weakness   Current Outpatient Prescriptions  Medication Sig Dispense Refill  . albuterol (PROAIR HFA) 108 (90 BASE) MCG/ACT inhaler Inhale 2 puffs into the lungs every 6 (six) hours as needed for wheezing.  1 Inhaler  1  . aspirin 325 MG tablet Take 325 mg by mouth daily.        . clopidogrel (PLAVIX) 75 MG tablet take 1 tablet by mouth daily  90 tablet  4  . doxazosin (CARDURA) 4 MG tablet take 1 tablet by mouth at bedtime  90 tablet  1  . losartan (COZAAR) 100 MG  tablet Take 1 tablet (100 mg total) by mouth daily.  90 tablet  3  . metFORMIN (GLUCOPHAGE) 500 MG tablet take 1 tablet by mouth twice a day  60 tablet  11  . metoprolol (LOPRESSOR) 100 MG tablet take 1 tablet by mouth twice a day  180 tablet  2  . simvastatin (ZOCOR) 80 MG tablet Take 0.5 tablets (40 mg total) by mouth at bedtime.  45 tablet  6  . solifenacin (VESICARE) 10 MG tablet Take 1 tablet (10 mg total) by mouth daily.  8 tablet  0  . tiotropium (SPIRIVA) 18 MCG inhalation capsule Place 1 capsule (18 mcg total) into inhaler and inhale daily.  30 capsule  12  . triamcinolone ointment (KENALOG) 0.5 % Apply 1 application topically daily as needed.  30 g  1   No current facility-administered medications for this visit.    Allergies  Review of patient's allergies indicates no known allergies.  Electrocardiogram:  Assessment and Plan

## 2014-02-26 NOTE — Assessment & Plan Note (Signed)
Cholesterol is at goal.  Continue current dose of statin and diet Rx.  No myalgias or side effects.  F/U  LFT's in 6 months. Lab Results  Component Value Date   LDLCALC 48 10/12/2013

## 2014-02-26 NOTE — Assessment & Plan Note (Signed)
Well controlled.  Continue current medications and low sodium Dash type diet.    

## 2014-02-26 NOTE — Progress Notes (Signed)
Carotid duplex complete 

## 2014-02-26 NOTE — Telephone Encounter (Signed)
Will need OV.  With no fever and chills we don't usually give antibiotics unless sick for more than a week.

## 2014-02-27 NOTE — Telephone Encounter (Signed)
Called and lvm informing pt that he will need to call back and schedule an appt.[name]

## 2014-03-02 ENCOUNTER — Ambulatory Visit (INDEPENDENT_AMBULATORY_CARE_PROVIDER_SITE_OTHER): Payer: Medicare Other | Admitting: Family Medicine

## 2014-03-02 ENCOUNTER — Encounter: Payer: Self-pay | Admitting: Family Medicine

## 2014-03-02 VITALS — BP 141/64 | HR 56 | Temp 98.7°F | Wt 203.0 lb

## 2014-03-02 DIAGNOSIS — J019 Acute sinusitis, unspecified: Secondary | ICD-10-CM

## 2014-03-02 MED ORDER — AMOXICILLIN-POT CLAVULANATE 875-125 MG PO TABS: 1.0000 | ORAL_TABLET | Freq: Two times a day (BID) | ORAL | Status: DC

## 2014-03-02 NOTE — Patient Instructions (Signed)

## 2014-03-02 NOTE — Progress Notes (Signed)
   Subjective:    Patient ID: [name] [name], male    DOB: 1941-06-03, 73 y.o.   MRN: [id number]  HPI Green sinus congestion with mld congestion x2-3 weeks.  No fever, chills ro sweat. No ST no facial pain.  significan post nasal drip.  No itching in throat or ears.No meds for it.   No GI symptoms.   Review of Systems     Objective:   Physical Exam  Constitutional: He is oriented to person, place, and time. He appears well-developed and well-nourished.  HENT:  Head: Normocephalic and atraumatic.  Right Ear: External ear normal.  Left Ear: External ear normal.  Nose: Nose normal.  Mouth/Throat: Oropharynx is clear and moist.  TMs and canals are clear.   Eyes: Conjunctivae and EOM are normal. Pupils are equal, round, and reactive to light.  Neck: Neck supple. No thyromegaly present.  Cardiovascular: Normal rate and normal heart sounds.   Pulmonary/Chest: Effort normal and breath sounds normal.  Lymphadenopathy:    He has no cervical adenopathy.  Neurological: He is alert and oriented to person, place, and time.  Skin: Skin is warm and dry.  Psychiatric: He has a normal mood and affect.          Assessment & Plan:  Acute sinusitis - will treat with Augmentin. If not significantly better in one week and please give [place] a call. Continues with Medicare. Avoid over-the-counter decongestants.

## 2014-03-14 ENCOUNTER — Telehealth: Payer: Self-pay | Admitting: *Deleted

## 2014-03-14 NOTE — Telephone Encounter (Signed)
He is already on doxazosin which is in the same family as tamsulosin. We can certainly switch him to see if he prefers the tamsulosin. Please let me know.

## 2014-03-14 NOTE — Telephone Encounter (Signed)
[name], [name]

## 2014-03-14 NOTE — Telephone Encounter (Signed)
Pt called and wanted to know if Dr. Madilyn Fireman would write rx for tamsulosin.[name] [name]

## 2014-03-16 ENCOUNTER — Ambulatory Visit: Payer: Medicare Other | Admitting: Cardiovascular Disease

## 2014-03-16 ENCOUNTER — Encounter (HOSPITAL_COMMUNITY): Payer: Medicare Other

## 2014-03-21 ENCOUNTER — Telehealth: Payer: Self-pay | Admitting: *Deleted

## 2014-03-21 MED ORDER — AMOXICILLIN-POT CLAVULANATE 875-125 MG PO TABS: 1.0000 | ORAL_TABLET | Freq: Two times a day (BID) | ORAL | Status: DC

## 2014-03-21 NOTE — Telephone Encounter (Signed)
Pt stated that he is starting to get the green mucus again. Sent rx for Augmentin to [facility] and informed pt that should he begin to feel worse or sxs don't improve he will need to come in to be seen. Pt voiced understanding and agreed.Audelia Hives [place]

## 2014-04-06 ENCOUNTER — Telehealth: Payer: Self-pay | Admitting: *Deleted

## 2014-04-06 NOTE — Telephone Encounter (Signed)
[facility] sent refill request for tramadol and I looked through chart and then called pharmacy and advised them that this script was declined and pt needed to make appt for med refill. The tramadol was actually discontinued off his med list and his last appt was last fall. Margette Fast, CMA

## 2014-04-30 ENCOUNTER — Ambulatory Visit (INDEPENDENT_AMBULATORY_CARE_PROVIDER_SITE_OTHER): Payer: Medicare Other | Admitting: Family Medicine

## 2014-04-30 ENCOUNTER — Encounter: Payer: Self-pay | Admitting: Family Medicine

## 2014-04-30 ENCOUNTER — Other Ambulatory Visit: Payer: Self-pay | Admitting: *Deleted

## 2014-04-30 VITALS — BP 148/72 | HR 68

## 2014-04-30 DIAGNOSIS — I10 Essential (primary) hypertension: Secondary | ICD-10-CM

## 2014-04-30 DIAGNOSIS — E119 Type 2 diabetes mellitus without complications: Secondary | ICD-10-CM

## 2014-04-30 DIAGNOSIS — R351 Nocturia: Secondary | ICD-10-CM

## 2014-04-30 LAB — POCT UA - MICROALBUMIN
Creatinine, POC: 200 mg/dL
Microalbumin Ur, POC: 150 mg/L

## 2014-04-30 LAB — POCT GLYCOSYLATED HEMOGLOBIN (HGB A1C): Hemoglobin A1C: 6.5

## 2014-04-30 MED ORDER — TADALAFIL 5 MG PO TABS: 5.0000 mg | ORAL_TABLET | Freq: Every day | ORAL | Status: DC | PRN

## 2014-04-30 MED ORDER — TRAMADOL HCL 50 MG PO TABS: 50.0000 mg | ORAL_TABLET | Freq: Two times a day (BID) | ORAL | Status: AC | PRN

## 2014-04-30 NOTE — Progress Notes (Signed)
Subjective:    Patient ID: [name] [name], male    DOB: 1941-06-19, 73 y.o.   MRN: [id number]  HPI Diabetes - no hypoglycemic events. No wounds or sores that are not healing well. No increased thirst or urination. Checking glucose at home. Taking medications as prescribed without any side effects.  Hypertension- Pt denies chest pain, SOB, dizziness, or heart palpitations.  Taking meds as directed w/o problems.  Denies medication side effects.    Lab Results  Component Value Date   CHOL 107 10/12/2013   HDL 36* 10/12/2013   LDLCALC 48 10/12/2013   TRIG 113 10/12/2013   CHOLHDL 3.0 10/12/2013   Nocturia - he feels that his nocturia has been gradually getting worse. He is now getting almost every hour to urinate. He did try the samples of VESIcare and thinks it may have helped some. He said he really wants to try daily Cialis and see if it makes a difference or not. He started taking Cardura.  Review of Systems  BP 148/72  Pulse 68    No Known Allergies  Past Medical History  Diagnosis Date  . CAD (coronary artery disease)   . Bradycardia   . Bruit   . Dyspnea   . Hypercholesteremia     II A  . Hypertension   . Seborrheic keratosis   . Bigeminy   . Claudication   . Allergic rhinitis   . Malignant neoplasm of prostate   . Microalbuminuria   . Diabetes mellitus type II   . History of prostate cancer     Dr. Gaynelle Arabian    Past Surgical History  Procedure Laterality Date  . Coronary angioplasty with stent placement      History   Social History  . Marital Status: Married    Spouse Name: N/A    Number of Children: N/A  . Years of Education: N/A   Occupational History  . Retired but still works Nature conservation officer    Social History Main Topics  . Smoking status: Former Smoker -- 1.00 packs/day for 20 years    Quit date: 04/22/2007  . Smokeless tobacco: Not on file  . Alcohol Use: 0.5 oz/week    1 drink(s) per week  . Drug Use: Not on file  . Sexual Activity: Not on  file   Other Topics Concern  . Not on file   Social History Narrative   Widowed 08/2009   Has 4 kids, 2 are local   Fair diet   No regular exercise    Family History  Problem Relation Age of Onset  . Heart attack Father   . Coronary artery disease Brother     Outpatient Encounter Prescriptions as of 04/30/2014  Medication Sig  . albuterol (PROAIR HFA) 108 (90 BASE) MCG/ACT inhaler Inhale 2 puffs into the lungs every 6 (six) hours as needed for wheezing.  Marland Kitchen aspirin 325 MG tablet Take 325 mg by mouth daily.    . clopidogrel (PLAVIX) 75 MG tablet take 1 tablet by mouth daily  . doxazosin (CARDURA) 4 MG tablet take 1 tablet by mouth at bedtime  . losartan (COZAAR) 100 MG tablet Take 1 tablet (100 mg total) by mouth daily.  . metFORMIN (GLUCOPHAGE) 500 MG tablet take 1 tablet by mouth twice a day  . metoprolol (LOPRESSOR) 100 MG tablet take 1 tablet by mouth twice a day  . simvastatin (ZOCOR) 80 MG tablet Take 0.5 tablets (40 mg total) by mouth at bedtime.  . solifenacin (  VESICARE) 10 MG tablet Take 1 tablet (10 mg total) by mouth daily.  . tadalafil (CIALIS) 5 MG tablet Take 1 tablet (5 mg total) by mouth daily as needed for erectile dysfunction.  Marland Kitchen tiotropium (SPIRIVA) 18 MCG inhalation capsule Place 1 capsule (18 mcg total) into inhaler and inhale daily.  Marland Kitchen triamcinolone ointment (KENALOG) 0.5 % Apply 1 application topically daily as needed.  . [DISCONTINUED] amoxicillin-clavulanate (AUGMENTIN) 875-125 MG per tablet Take 1 tablet by mouth 2 (two) times daily.          Objective:   Physical Exam  Constitutional: He is oriented to person, place, and time. He appears well-developed and well-nourished.  HENT:  Head: Normocephalic and atraumatic.  Cardiovascular: Normal rate, regular rhythm and normal heart sounds.   Pulmonary/Chest: Effort normal and breath sounds normal.  Neurological: He is alert and oriented to person, place, and time.  Skin: Skin is warm and dry.   Psychiatric: He has a normal mood and affect. His behavior is normal.          Assessment & Plan:  DM- well controlled at 6.5.  Foot exam performed today. Urine micro performed today. On ARB.  F/U in 3 mo  HTN - well controlled today.  Continue current regimen.    Nocturia - He tried vesicare and cardura. Gettting up every hour at night.  He want to try daily cialis. Tiral of cialis 5mg  daily x 30 minutes.  He plans on getting back in with his urologist soon. He said overall his workup is fairly negative.

## 2014-05-11 ENCOUNTER — Other Ambulatory Visit: Payer: Self-pay | Admitting: *Deleted

## 2014-05-11 MED ORDER — TRAMADOL HCL 50 MG PO TABS: 50.0000 mg | ORAL_TABLET | Freq: Two times a day (BID) | ORAL | Status: AC | PRN

## 2014-05-14 ENCOUNTER — Ambulatory Visit (INDEPENDENT_AMBULATORY_CARE_PROVIDER_SITE_OTHER): Payer: Medicare Other | Admitting: Family Medicine

## 2014-05-14 ENCOUNTER — Encounter: Payer: Self-pay | Admitting: Family Medicine

## 2014-05-14 VITALS — BP 134/58 | HR 51 | Wt 192.0 lb

## 2014-05-14 DIAGNOSIS — R1032 Left lower quadrant pain: Secondary | ICD-10-CM

## 2014-05-14 DIAGNOSIS — Z23 Encounter for immunization: Secondary | ICD-10-CM

## 2014-05-14 DIAGNOSIS — Z0000 Encounter for general adult medical examination without abnormal findings: Secondary | ICD-10-CM

## 2014-05-14 MED ORDER — DILTIAZEM GEL 2 %: 1.0000 "application " | Freq: Three times a day (TID) | CUTANEOUS | Status: DC

## 2014-05-14 NOTE — Progress Notes (Signed)
Subjective:    [name] [name] is a 73 y.o. male who presents for Medicare Annual/Subsequent preventive examination.   Preventive Screening-Counseling & Management  Tobacco History  Smoking status  . Former Smoker -- 1.00 packs/day for 20 years  . Quit date: 04/22/2007  Smokeless tobacco  . Not on file    Problems Prior to Visit 1. still having pain in the left lower quadrant. He has been seen by Dr. Gaynelle Arabian, multiple times and they have not found a cause.  Current Problems (verified) Patient Active Problem List   Diagnosis Date Noted  . Overactive bladder 10/12/2013  . CKD stage G3b/A2, GFR 30 - 44 and albumin creatinine ratio 30 - 299 mg/g 04/04/2013  . Rupture long head biceps tendon 12/23/2012  . Melanoma in situ 04/22/2012  . MRSA infection 12/17/2011  . Carotid disease, bilateral 05/08/2011  . ALKALINE PHOSPHATASE, ELEVATED 10/29/2009  . HEMORRHOIDS, EXTERNAL 10/25/2009  . MUSCLE STRAIN, ABDOMINAL WALL 09/06/2009  . DYSPEPSIA 08/26/2009  . ATHEROSCLEROSIS W /INT CLAUDICATION 04/08/2009  . Rotator cuff syndrome of right shoulder 03/04/2009  . CONGENITAL PREAURICULAR CYST 03/04/2009  . HYPERCHOLESTEROLEMIA  IIA 01/18/2009  . HYPERTENSION, UNSPECIFIED 01/18/2009  . SEBORRHEIC KERATOSIS 01/18/2009  . Other symptoms involving cardiovascular system 01/18/2009  . CLAUDICATION 01/07/2009  . COPD, severe 01/07/2009  . ALLERGIC RHINITIS 06/28/2008  . MALIGNANT NEOPLASM OF PROSTATE 01/19/2008  . MICROALBUMINURIA 12/05/2007  . DIABETES MELLITUS, TYPE II 11/28/2007  . CAD 11/28/2007  . Other Specified Cardiac Dysrhythmias 11/28/2007    Medications Prior to Visit Current Outpatient Prescriptions on File Prior to Visit  Medication Sig Dispense Refill  . albuterol (PROAIR HFA) 108 (90 BASE) MCG/ACT inhaler Inhale 2 puffs into the lungs every 6 (six) hours as needed for wheezing.  1 Inhaler  1  . aspirin 325 MG tablet Take 325 mg by mouth daily.        . clopidogrel  (PLAVIX) 75 MG tablet take 1 tablet by mouth daily  90 tablet  4  . doxazosin (CARDURA) 4 MG tablet take 1 tablet by mouth at bedtime  90 tablet  1  . losartan (COZAAR) 100 MG tablet Take 1 tablet (100 mg total) by mouth daily.  90 tablet  3  . metFORMIN (GLUCOPHAGE) 500 MG tablet take 1 tablet by mouth twice a day  60 tablet  11  . metoprolol (LOPRESSOR) 100 MG tablet take 1 tablet by mouth twice a day  180 tablet  2  . simvastatin (ZOCOR) 80 MG tablet Take 0.5 tablets (40 mg total) by mouth at bedtime.  45 tablet  6  . solifenacin (VESICARE) 10 MG tablet Take 1 tablet (10 mg total) by mouth daily.  8 tablet  0  . tadalafil (CIALIS) 5 MG tablet Take 1 tablet (5 mg total) by mouth daily as needed for erectile dysfunction.  30 tablet  0  . tiotropium (SPIRIVA) 18 MCG inhalation capsule Place 1 capsule (18 mcg total) into inhaler and inhale daily.  30 capsule  12  . traMADol (ULTRAM) 50 MG tablet Take 1 tablet (50 mg total) by mouth 2 (two) times daily as needed.  60 tablet  2  . triamcinolone ointment (KENALOG) 0.5 % Apply 1 application topically daily as needed.  30 g  1   No current facility-administered medications on file prior to visit.    Current Medications (verified) Current Outpatient Prescriptions  Medication Sig Dispense Refill  . albuterol (PROAIR HFA) 108 (90 BASE) MCG/ACT inhaler Inhale 2 puffs  into the lungs every 6 (six) hours as needed for wheezing.  1 Inhaler  1  . aspirin 325 MG tablet Take 325 mg by mouth daily.        . clopidogrel (PLAVIX) 75 MG tablet take 1 tablet by mouth daily  90 tablet  4  . doxazosin (CARDURA) 4 MG tablet take 1 tablet by mouth at bedtime  90 tablet  1  . losartan (COZAAR) 100 MG tablet Take 1 tablet (100 mg total) by mouth daily.  90 tablet  3  . metFORMIN (GLUCOPHAGE) 500 MG tablet take 1 tablet by mouth twice a day  60 tablet  11  . metoprolol (LOPRESSOR) 100 MG tablet take 1 tablet by mouth twice a day  180 tablet  2  . simvastatin (ZOCOR) 80  MG tablet Take 0.5 tablets (40 mg total) by mouth at bedtime.  45 tablet  6  . solifenacin (VESICARE) 10 MG tablet Take 1 tablet (10 mg total) by mouth daily.  8 tablet  0  . tadalafil (CIALIS) 5 MG tablet Take 1 tablet (5 mg total) by mouth daily as needed for erectile dysfunction.  30 tablet  0  . tiotropium (SPIRIVA) 18 MCG inhalation capsule Place 1 capsule (18 mcg total) into inhaler and inhale daily.  30 capsule  12  . traMADol (ULTRAM) 50 MG tablet Take 1 tablet (50 mg total) by mouth 2 (two) times daily as needed.  60 tablet  2  . triamcinolone ointment (KENALOG) 0.5 % Apply 1 application topically daily as needed.  30 g  1   No current facility-administered medications for this visit.     Allergies (verified) Review of patient's allergies indicates no known allergies.   PAST HISTORY  Family History Family History  Problem Relation Age of Onset  . Heart attack Father   . Coronary artery disease Brother     Social History History  Substance Use Topics  . Smoking status: Former Smoker -- 1.00 packs/day for 20 years    Quit date: 04/22/2007  . Smokeless tobacco: Not on file  . Alcohol Use: 0.5 oz/week    1 drink(s) per week    Are there smokers in your home (other than you)?  No  Risk Factors Current exercise habits: The patient does not participate in regular exercise at present.  Dietary issues discussed: none    Cardiac risk factors: advanced age (older than 7 for men, 64 for women), diabetes mellitus, hypertension, male gender and sedentary lifestyle.  Depression Screen (Note: if answer to either of the following is "Yes", a more complete depression screening is indicated)   Q1: Over the past two weeks, have you felt down, depressed or hopeless? No  Q2: Over the past two weeks, have you felt little interest or pleasure in doing things? No  Have you lost interest or pleasure in daily life? No  Do you often feel hopeless? No  Do you cry easily over simple  problems? No  Activities of Daily Living In your present state of health, do you have any difficulty performing the following activities?:  Driving? No Managing money?  No Feeding yourself? No Getting from bed to chair? No Climbing a flight of stairs? No Preparing food and eating?: No Bathing or showering? No Getting dressed: No Getting to the toilet? No Using the toilet:No Moving around from place to place: No In the past year have you fallen or had a near fall?:No   Are you sexually active?  No  Do you have more than one partner?  No  Hearing Difficulties: No Do you often ask people to speak up or repeat themselves? No Do you experience ringing or noises in your ears? No Do you have difficulty understanding soft or whispered voices? No   Do you feel that you have a problem with memory? No  Do you often misplace items? No  Do you feel safe at home?  Yes  Cognitive Testing  Alert? Yes  Normal Appearance?Yes  Oriented to person? Yes  Place? Yes   Time? Yes  Recall of three objects?  Yes  Can perform simple calculations? Yes  Displays appropriate judgment?Yes  Can read the correct time from a watch face?Yes   Advanced Directives have been discussed with the patient? Yes  6 CIT score of 0/28.   List the Names of Other Physician/Practitioners you currently use: 1.  Dr. Jenkins Rouge  Indicate any recent Medical Services you may have received from other than [facility] providers in the past year (date may be approximate).  Immunization History  Administered Date(s) Administered  . H1N1 09/03/2008  . Influenza Split 08/02/2012  . Influenza Whole 08/07/2009, 08/25/2010, 05/29/2011  . Influenza,inj,Quad PF,36+ Mos 07/18/2013  . Pneumococcal Polysaccharide-23 09/03/2008  . Td 10/25/2009    Screening Tests Health Maintenance  Topic Date Due  . Influenza Vaccine  04/21/2014  . Ophthalmology Exam  06/30/2014  . Hemoglobin A1c  10/31/2014  . Foot Exam  05/01/2015  . Urine  Microalbumin  05/01/2015  . Tetanus/tdap  10/26/2019  . Colonoscopy  05/06/2021  . Pneumococcal Polysaccharide Vaccine Age 63 And Over  Completed  . Zostavax  Addressed    All answers were reviewed with the patient and necessary referrals were made:  Yalda Herd, MD   05/14/2014   History reviewed: allergies, current medications, past family history, past medical history, past social history, past surgical history and problem list  Review of Systems A comprehensive review of systems was negative.    Objective:     Vision by Snellen chart: ewill call for eye report, [facility]  Blood pressure 134/58, pulse 51, weight 192 lb (87.091 kg). Body mass index is 26.03 kg/(m^2).  BP 134/58  Pulse 51  Wt 192 lb (87.091 kg)  General Appearance:    Alert, cooperative, no distress, appears stated age  Head:    Normocephalic, without obvious abnormality, atraumatic  Eyes:    PERRL, conjunctiva/corneas clear, EOM's intact, both eyes       Ears:    Normal TM's and external ear canals, both ears  Nose:   Nares normal, septum midline, mucosa normal, no drainage    or sinus tenderness  Throat:   Lips, mucosa, and tongue normal; teeth and gums normal  Neck:   Supple, symmetrical, trachea midline, no adenopathy;       thyroid:  No enlargement/tenderness/nodules; no carotid   bruit or JVD  Back:     Symmetric, no curvature, ROM normal, no CVA tenderness  Lungs:     Clear to auscultation bilaterally, respirations unlabored  Chest wall:    No tenderness or deformity  Heart:    Regular rate and rhythm, S1 and S2 normal, no murmur, rub   or gallop  Abdomen:     Soft, non-tender, bowel sounds active all four quadrants,    no masses, no organomegaly  Genitalia:    Normal male without lesion, discharge or tenderness, unable to palpate a hernia   Rectal:    Not performed.  Extremities:   Extremities normal, atraumatic, no cyanosis or edema, normal hip exam. No significant pain with internal or  external rotation. He does have very tight hips with extension. Strength in the hips, knees, ankles is out of 5 bilaterally.   Pulses:   2+ and symmetric all extremities  Skin:   Skin color, texture, turgor normal, no rashes or lesions  Lymph nodes:   Cervical, supraclavicular, and axillary nodes normal  Neurologic:   CNII-XII intact. Normal strength, sensation and reflexes      throughout       Assessment:    Medicare Wellness Exam      Plan:     During the course of the visit the patient was educated and counseled about appropriate screening and preventive services including:    Pneumococcal vaccine   Flu shot  Neg screen for dementia  Right groin pain. Would like to refer to Dr. Darene Lamer for further evaluation. At this point I think urologic causes have been ruled out. He really is not having any significant GI symptoms except for rectal fissure. I think it's worth being evaluated for 8 months the skeletal injury. I was unable to reproduce his pain near his hip today.  Rectal fissure-he would like to retry the topical diltiazem 2% gel. He used it about 6 years ago. Wasn't convinced how helpful it was but he would like to reconsider trying it. It's not helpful then consider referral to surgeon Karle Starch to surgery for further treatment options.  Diet review for nutrition referral? Yes ____  Not Indicated X__   Patient Instructions (the written plan) was given to the patient.  Medicare Attestation I have personally reviewed: The patient's medical and social history Their use of alcohol, tobacco or illicit drugs Their current medications and supplements The patient's functional ability including ADLs,fall risks, home safety risks, cognitive, and hearing and visual impairment Diet and physical activities Evidence for depression or mood disorders  The patient's weight, height, BMI, and visual acuity have been recorded in the chart.  I have made referrals, counseling, and provided  education to the patient based on review of the above and I have provided the patient with a written personalized care plan for preventive services.     Rupa Lagan, MD   05/14/2014

## 2014-05-14 NOTE — Addendum Note (Signed)
Addended by: [name] on: 05/14/2014 05:34 PM   Modules accepted: Orders

## 2014-05-14 NOTE — Patient Instructions (Signed)
Keep up a regular exercise program and make sure you are eating a healthy diet Try to eat 4 servings of dairy a day, or if you are lactose intolerant take a calcium with vitamin D daily.  Your vaccines are up to date.   

## 2014-06-12 ENCOUNTER — Other Ambulatory Visit: Payer: Self-pay | Admitting: *Deleted

## 2014-06-12 MED ORDER — LOSARTAN POTASSIUM 100 MG PO TABS: 100.0000 mg | ORAL_TABLET | Freq: Every day | ORAL | Status: DC

## 2014-06-12 MED ORDER — CLOPIDOGREL BISULFATE 75 MG PO TABS: ORAL_TABLET | ORAL | Status: DC

## 2014-07-30 ENCOUNTER — Ambulatory Visit (INDEPENDENT_AMBULATORY_CARE_PROVIDER_SITE_OTHER): Payer: Medicare Other | Admitting: Family Medicine

## 2014-07-30 ENCOUNTER — Encounter: Payer: Self-pay | Admitting: Family Medicine

## 2014-07-30 VITALS — BP 157/67 | HR 54 | Wt 198.0 lb

## 2014-07-30 DIAGNOSIS — J449 Chronic obstructive pulmonary disease, unspecified: Secondary | ICD-10-CM

## 2014-07-30 DIAGNOSIS — E119 Type 2 diabetes mellitus without complications: Secondary | ICD-10-CM

## 2014-07-30 DIAGNOSIS — I10 Essential (primary) hypertension: Secondary | ICD-10-CM

## 2014-07-30 LAB — POCT GLYCOSYLATED HEMOGLOBIN (HGB A1C): Hemoglobin A1C: 6.3

## 2014-07-30 MED ORDER — TIOTROPIUM BROMIDE MONOHYDRATE 18 MCG IN CAPS: 18.0000 ug | ORAL_CAPSULE | Freq: Every day | RESPIRATORY_TRACT | Status: DC

## 2014-07-30 NOTE — Progress Notes (Signed)
   Subjective:    Patient ID: [name] [name], male    DOB: [birth year], 73 y.o.   MRN: [id number]  HPI Diabetes - no hypoglycemic events. No wounds or sores that are not healing well. No increased thirst or urination. Not checking glucose at home. Taking medications as prescribed without any side effects.  Hypertension- Pt denies chest pain, SOB, dizziness, or heart palpitations.  Taking meds as directed w/o problems.  Denies medication side effects.    COPD - says the spiriva is now $100 per month now that he has hit his medicare gap.  No recent flares or exacerbatins. Quit using it bc he ran out.  .    Review of Systems     Objective:   Physical Exam  Constitutional: He is oriented to person, place, and time. He appears well-developed and well-nourished.  HENT:  Head: Normocephalic and atraumatic.  Cardiovascular: Normal rate, regular rhythm and normal heart sounds.   Pulmonary/Chest: Effort normal and breath sounds normal.  Neurological: He is alert and oriented to person, place, and time.  Skin: Skin is warm and dry.  Psychiatric: He has a normal mood and affect. His behavior is normal.          Assessment & Plan:  DM- Well Controlled. Is 6.3.   On statin and ASA.  Has eye appt in the next 2 weeks.  F/U in 3 months.    HTN- Uncontrolled.  Will recheck today.    COPD- on spiriva and albuterol PRN. Given samples today. Call if need samples for December.

## 2014-08-13 ENCOUNTER — Other Ambulatory Visit: Payer: Self-pay | Admitting: Family Medicine

## 2014-09-03 ENCOUNTER — Other Ambulatory Visit: Payer: Self-pay | Admitting: *Deleted

## 2014-09-03 MED ORDER — SIMVASTATIN 80 MG PO TABS: 40.0000 mg | ORAL_TABLET | Freq: Every day | ORAL | Status: DC

## 2014-09-05 ENCOUNTER — Other Ambulatory Visit (HOSPITAL_COMMUNITY): Payer: Self-pay | Admitting: *Deleted

## 2014-09-05 DIAGNOSIS — I6523 Occlusion and stenosis of bilateral carotid arteries: Secondary | ICD-10-CM

## 2014-09-06 ENCOUNTER — Telehealth: Payer: Self-pay | Admitting: *Deleted

## 2014-09-06 NOTE — Telephone Encounter (Signed)
Pt called for sample of spiriva handihaler. Will place up front for p/u  [facility] HANDIHALER MAN: Wyvonnia Dusky NCD: [id number] LOT:  [id number] A EXP:  8/16 .Audelia Hives [place]

## 2014-09-07 ENCOUNTER — Ambulatory Visit: Payer: Medicare Other

## 2014-09-11 LAB — HM DIABETES EYE EXAM

## 2014-09-17 ENCOUNTER — Telehealth: Payer: Self-pay | Admitting: *Deleted

## 2014-09-17 NOTE — Telephone Encounter (Signed)
Pt lvm asking if Dr. Madilyn Fireman would send a prescription for ABX he's getting the Green mucus again. I attempted to call pt back and the phone rings once and there's a lot of static in the line and then disconnects. Pt will need to make an appt to be seen .[name] [name]

## 2014-09-19 ENCOUNTER — Other Ambulatory Visit: Payer: Self-pay | Admitting: *Deleted

## 2014-09-19 ENCOUNTER — Other Ambulatory Visit: Payer: Self-pay | Admitting: Cardiovascular Disease

## 2014-09-19 MED ORDER — LOSARTAN POTASSIUM 100 MG PO TABS: 100.0000 mg | ORAL_TABLET | Freq: Every day | ORAL | Status: DC

## 2014-09-19 MED ORDER — CLOPIDOGREL BISULFATE 75 MG PO TABS: ORAL_TABLET | ORAL | Status: DC

## 2014-09-24 ENCOUNTER — Telehealth: Payer: Self-pay | Admitting: *Deleted

## 2014-09-24 ENCOUNTER — Ambulatory Visit (INDEPENDENT_AMBULATORY_CARE_PROVIDER_SITE_OTHER): Payer: Medicare Other | Admitting: Family Medicine

## 2014-09-24 ENCOUNTER — Encounter: Payer: Self-pay | Admitting: Family Medicine

## 2014-09-24 VITALS — BP 130/70 | HR 53 | Temp 97.6°F | Wt 198.0 lb

## 2014-09-24 DIAGNOSIS — J0120 Acute ethmoidal sinusitis, unspecified: Secondary | ICD-10-CM

## 2014-09-24 MED ORDER — AZITHROMYCIN 250 MG PO TABS: ORAL_TABLET | ORAL | Status: DC

## 2014-09-24 MED ORDER — TIOTROPIUM BROMIDE MONOHYDRATE 18 MCG IN CAPS: 18.0000 ug | ORAL_CAPSULE | Freq: Every day | RESPIRATORY_TRACT | Status: DC

## 2014-09-24 MED ORDER — TIOTROPIUM BROMIDE MONOHYDRATE 2.5 MCG/ACT IN AERS: 1.0000 | INHALATION_SPRAY | Freq: Every day | RESPIRATORY_TRACT | Status: DC

## 2014-09-24 MED ORDER — TADALAFIL 5 MG PO TABS: 5.0000 mg | ORAL_TABLET | Freq: Every day | ORAL | Status: DC | PRN

## 2014-09-24 MED ORDER — ALBUTEROL SULFATE HFA 108 (90 BASE) MCG/ACT IN AERS: 2.0000 | INHALATION_SPRAY | Freq: Four times a day (QID) | RESPIRATORY_TRACT | Status: DC | PRN

## 2014-09-24 MED ORDER — DOXAZOSIN MESYLATE 4 MG PO TABS: ORAL_TABLET | ORAL | Status: DC

## 2014-09-24 NOTE — Progress Notes (Signed)
   Subjective:    Patient ID: [name] [name], male    DOB: June 28, 1941, 74 y.o.   MRN: [id number]  HPI Sinusitis symptoms 2 weeks. He is now seeing green mucus from his nose. He has not tried any over-the-counter products. Positive sick contacts. No fevers, chills or sweats.  Pressure beside the nose.  Mild ST and slight cough. No ear pain.    Review of Systems     Objective:   Physical Exam  Constitutional: He is oriented to person, place, and time. He appears well-developed and well-nourished.  HENT:  Head: Normocephalic and atraumatic.  Right Ear: External ear normal.  Left Ear: External ear normal.  Nose: Nose normal.  Mouth/Throat: Oropharynx is clear and moist.  TMs and canals are clear.   Eyes: Conjunctivae and EOM are normal. Pupils are equal, round, and reactive to light.  Neck: Neck supple. No thyromegaly present.  Cardiovascular: Normal rate and normal heart sounds.   Pulmonary/Chest: Effort normal and breath sounds normal.  Lymphadenopathy:    He has no cervical adenopathy.  Neurological: He is alert and oriented to person, place, and time.  Skin: Skin is warm and dry.  Psychiatric: He has a normal mood and affect.          Assessment & Plan:  Acute sinusitis - Will tx with zpack.  He preferred this antibiotic choice.  Call if not better in one week.   Given sample of Spiriva.   HTN - repeat blood pressure was much improved. Will follow carefully.

## 2014-09-24 NOTE — Telephone Encounter (Signed)
Prior auth for cialis 5mg  initiated in cover my meds. Awaiting decision.

## 2014-09-24 NOTE — Progress Notes (Signed)
Patient ID: [name] [name], male   DOB: 07/26/1941, 74 y.o.   MRN: [id number] Devaun is seen today in F/U for CAD, HTN and elevated chol. Has a friend from [facility] living with him now since his wife died. Still selling rope. I believe he's had a stent to the OM in 96 and subsequent intervention to LAD. 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 He is not having any SSCP, palpitatoins SOB or edema. He has been compliant with his meds. His LDL has been under 100 with normal LFT's He has moderate carotid disease   Reviewed duplex from today 09/26/14  and stable  Left ICA 60-79% RICA 0-39%   Has chronic venous insuf with occasional skin breakdown at ankles  Wants handicap placard    ROS: Denies fever, malais, weight loss, blurry vision, decreased visual acuity, cough, sputum, SOB, hemoptysis, pleuritic pain, palpitaitons, heartburn, abdominal pain, melena, lower extremity edema, claudication, or rash.  All other systems reviewed and negative  General: Affect appropriate Healthy:  appears stated age 36: normal Neck supple with no adenopathy JVP normal no bruits no thyromegaly Lungs clear with no wheezing and good diaphragmatic motion Heart:  S1/S2 no murmur, no rub, gallop or click PMI normal Abdomen: benighn, BS positve, no tenderness, no AAA no bruit.  No HSM or HJR Distal pulses intact with no bruits Trace bilateral edema with venouls insuf and erythema medial malleolus  Neuro non-focal Skin warm and dry No muscular weakness   Current Outpatient Prescriptions  Medication Sig Dispense Refill  . albuterol (PROAIR HFA) 108 (90 BASE) MCG/ACT inhaler Inhale 2 puffs into the lungs every 6 (six) hours as needed for wheezing. 1 Inhaler 1  . aspirin 325 MG tablet Take 325 mg by mouth daily.      . clopidogrel (PLAVIX) 75 MG tablet take 1 tablet by mouth daily 90 tablet 3  . doxazosin (CARDURA) 4 MG tablet take 1 tablet by mouth at bedtime 90 tablet 1  . losartan (COZAAR) 100 MG  tablet Take 1 tablet (100 mg total) by mouth daily. 90 tablet 0  . metFORMIN (GLUCOPHAGE) 500 MG tablet take 1 tablet by mouth twice a day 60 tablet 11  . metoprolol (LOPRESSOR) 100 MG tablet take 1 tablet by mouth twice a day 180 tablet 2  . simvastatin (ZOCOR) 80 MG tablet Take 0.5 tablets (40 mg total) by mouth at bedtime. 45 tablet 0  . solifenacin (VESICARE) 10 MG tablet Take 1 tablet (10 mg total) by mouth daily. 8 tablet 0  . tadalafil (CIALIS) 5 MG tablet Take 1 tablet (5 mg total) by mouth daily as needed for erectile dysfunction. 30 tablet 0  . tiotropium (SPIRIVA) 18 MCG inhalation capsule Place 1 capsule (18 mcg total) into inhaler and inhale daily. 30 capsule 0  . triamcinolone ointment (KENALOG) 0.5 % Apply 1 application topically daily as needed. 30 g 1   No current facility-administered medications for this visit.    Allergies  Review of patient's allergies indicates no known allergies.  Electrocardiogram:  08/05/12  SR rate 51 normal ECG   SR rate 45 mormal   Assessment and Plan

## 2014-09-25 ENCOUNTER — Encounter: Payer: Self-pay | Admitting: Cardiovascular Disease

## 2014-09-25 ENCOUNTER — Ambulatory Visit (INDEPENDENT_AMBULATORY_CARE_PROVIDER_SITE_OTHER): Payer: Medicare Other | Admitting: Cardiovascular Disease

## 2014-09-25 ENCOUNTER — Ambulatory Visit (HOSPITAL_COMMUNITY): Payer: Medicare Other | Attending: Cardiovascular Disease | Admitting: *Deleted

## 2014-09-25 VITALS — BP 122/72 | HR 45 | Ht 71.0 in | Wt 195.8 lb

## 2014-09-25 DIAGNOSIS — I6523 Occlusion and stenosis of bilateral carotid arteries: Secondary | ICD-10-CM

## 2014-09-25 DIAGNOSIS — R001 Bradycardia, unspecified: Secondary | ICD-10-CM | POA: Diagnosis not present

## 2014-09-25 DIAGNOSIS — I2510 Atherosclerotic heart disease of native coronary artery without angina pectoris: Secondary | ICD-10-CM | POA: Insufficient documentation

## 2014-09-25 DIAGNOSIS — E785 Hyperlipidemia, unspecified: Secondary | ICD-10-CM | POA: Insufficient documentation

## 2014-09-25 DIAGNOSIS — E119 Type 2 diabetes mellitus without complications: Secondary | ICD-10-CM | POA: Insufficient documentation

## 2014-09-25 DIAGNOSIS — Z87891 Personal history of nicotine dependence: Secondary | ICD-10-CM | POA: Insufficient documentation

## 2014-09-25 DIAGNOSIS — I779 Disorder of arteries and arterioles, unspecified: Secondary | ICD-10-CM

## 2014-09-25 DIAGNOSIS — R0989 Other specified symptoms and signs involving the circulatory and respiratory systems: Secondary | ICD-10-CM | POA: Diagnosis not present

## 2014-09-25 DIAGNOSIS — I10 Essential (primary) hypertension: Secondary | ICD-10-CM | POA: Diagnosis not present

## 2014-09-25 DIAGNOSIS — I739 Peripheral vascular disease, unspecified: Secondary | ICD-10-CM

## 2014-09-25 MED ORDER — ATENOLOL 25 MG PO TABS
12.5000 mg | ORAL_TABLET | Freq: Every day | ORAL | Status: DC
Start: 2014-09-25 — End: 2015-08-26

## 2014-09-25 NOTE — Assessment & Plan Note (Signed)
Stable with no angina and good activity level.  Continue medical Rx Will d/c lopressor and give him atenolol 12.5 daily f/u next available see if HR ok  And not too low

## 2014-09-25 NOTE — Assessment & Plan Note (Signed)
Well controlled.  Continue current medications and low sodium Dash type diet.    

## 2014-09-25 NOTE — Progress Notes (Signed)
Carotid Duplex Exam Performed - stable f/u 1 year

## 2014-09-25 NOTE — Telephone Encounter (Signed)
[employer] approval for cialis received today. Effective 09/21/14 thru 09/21/2015. Pharmacy notified.

## 2014-09-25 NOTE — Assessment & Plan Note (Signed)
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  No TIA symptoms.  Continue antiplatelet Rx and F/U carotid duplex in 6 months   

## 2014-09-25 NOTE — Patient Instructions (Signed)
Your physician recommends that you schedule a follow-up appointment in: NEXT AVAILABLE WITH   DR [facility]  Your physician has recommended you make the following change in your medication: STOP  METOPROLOL START  ATENOLOL 12.5 MG  EVERY DAY

## 2014-10-02 DIAGNOSIS — R351 Nocturia: Secondary | ICD-10-CM | POA: Diagnosis not present

## 2014-10-02 DIAGNOSIS — C61 Malignant neoplasm of prostate: Secondary | ICD-10-CM | POA: Diagnosis not present

## 2014-10-19 ENCOUNTER — Ambulatory Visit (INDEPENDENT_AMBULATORY_CARE_PROVIDER_SITE_OTHER): Payer: Medicare Other | Admitting: Cardiovascular Disease

## 2014-10-19 VITALS — BP 188/80 | HR 88 | Ht 71.0 in | Wt 198.8 lb

## 2014-10-19 DIAGNOSIS — E7800 Pure hypercholesterolemia, unspecified: Secondary | ICD-10-CM

## 2014-10-19 DIAGNOSIS — I2510 Atherosclerotic heart disease of native coronary artery without angina pectoris: Secondary | ICD-10-CM

## 2014-10-19 DIAGNOSIS — R001 Bradycardia, unspecified: Secondary | ICD-10-CM | POA: Diagnosis not present

## 2014-10-19 DIAGNOSIS — I10 Essential (primary) hypertension: Secondary | ICD-10-CM | POA: Diagnosis not present

## 2014-10-19 DIAGNOSIS — I739 Peripheral vascular disease, unspecified: Secondary | ICD-10-CM

## 2014-10-19 DIAGNOSIS — I779 Disorder of arteries and arterioles, unspecified: Secondary | ICD-10-CM

## 2014-10-19 NOTE — Assessment & Plan Note (Signed)
Well controlled.  Continue current medications and low sodium Dash type diet.   Did not take atenolol before office visit this am Home readings good

## 2014-10-19 NOTE — Assessment & Plan Note (Signed)
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  No TIA symptoms.  Continue antiplatelet Rx and F/U carotid duplex in 6 months   

## 2014-10-19 NOTE — Assessment & Plan Note (Signed)
Improved tolerating atenolol better with no PVC on exam today f/u 3 months

## 2014-10-19 NOTE — Patient Instructions (Signed)
Your physician recommends that you schedule a follow-up appointment in:  3 MONTHS WITH  DR NISHAN  Your physician recommends that you continue on your current medications as directed. Please refer to the Current Medication list given to you today.  

## 2014-10-19 NOTE — Assessment & Plan Note (Signed)
Stable with no angina and good activity level.  Continue medical Rx  

## 2014-10-19 NOTE — Assessment & Plan Note (Signed)
Cholesterol is at goal.  Continue current dose of statin and diet Rx.  No myalgias or side effects.  F/U  LFT's in 6 months. Lab Results  Component Value Date   LDLCALC 48 10/12/2013

## 2014-10-19 NOTE — Progress Notes (Signed)
Patient ID: [name] [name], male   DOB: December 25, 1940, 74 y.o.   MRN: [id number] Herminio is seen today in F/U for CAD, HTN and elevated chol. Has a friend from [facility] living with him now since his wife died. Still selling rope. I believe he's had a stent to the OM in 96 and subsequent intervention to LAD. 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 He is not having any SSCP, palpitatoins SOB or edema. He has been compliant with his meds. His LDL has been under 100 with normal LFT's He has moderate carotid disease   Reviewed duplex from today 09/26/14  and stable  Left ICA 60-79% RICA 0-39%   Has chronic venous insuf with occasional skin breakdown at ankles  Wants handicap placard   Last visit lopressor d/c and low dose once daily atenolol given  HR higher and no palpitations    ROS: Denies fever, malais, weight loss, blurry vision, decreased visual acuity, cough, sputum, SOB, hemoptysis, pleuritic pain, palpitaitons, heartburn, abdominal pain, melena, lower extremity edema, claudication, or rash.  All other systems reviewed and negative  General: Affect appropriate Healthy:  appears stated age 20: normal Neck supple with no adenopathy JVP normal no bruits no thyromegaly Lungs clear with no wheezing and good diaphragmatic motion Heart:  S1/S2 no murmur, no rub, gallop or click PMI normal Abdomen: benighn, BS positve, no tenderness, no AAA no bruit.  No HSM or HJR Distal pulses intact with no bruits Trace bilateral edema with venouls insuf and erythema medial malleolus  Neuro non-focal Skin warm and dry No muscular weakness   Current Outpatient Prescriptions  Medication Sig Dispense Refill  . albuterol (PROAIR HFA) 108 (90 BASE) MCG/ACT inhaler Inhale 2 puffs into the lungs every 6 (six) hours as needed for wheezing. 1 Inhaler 2  . aspirin 325 MG tablet Take 325 mg by mouth daily.      Marland Kitchen atenolol (TENORMIN) 25 MG tablet Take 0.5 tablets (12.5 mg total) by mouth daily. 45  tablet 3  . azithromycin (ZITHROMAX) 250 MG tablet 2 tabs on Day  1, then one a day x 4 days. 6 tablet 0  . clopidogrel (PLAVIX) 75 MG tablet take 1 tablet by mouth daily 90 tablet 3  . doxazosin (CARDURA) 4 MG tablet take 1 tablet by mouth at bedtime 90 tablet 1  . losartan (COZAAR) 100 MG tablet Take 1 tablet (100 mg total) by mouth daily. 90 tablet 0  . metFORMIN (GLUCOPHAGE) 500 MG tablet take 1 tablet by mouth twice a day 60 tablet 11  . simvastatin (ZOCOR) 80 MG tablet Take 0.5 tablets (40 mg total) by mouth at bedtime. 45 tablet 0  . tadalafil (CIALIS) 5 MG tablet Take 1 tablet (5 mg total) by mouth daily as needed for erectile dysfunction. 30 tablet 0  . tiotropium (SPIRIVA) 18 MCG inhalation capsule Place 1 capsule (18 mcg total) into inhaler and inhale daily. 30 capsule 11   No current facility-administered medications for this visit.    Allergies  Review of patient's allergies indicates no known allergies.  Electrocardiogram:  08/05/12  SR rate 51 normal ECG    09/25/14  SR rate 45 normal   Assessment and Plan

## 2014-12-11 ENCOUNTER — Other Ambulatory Visit: Payer: Self-pay | Admitting: *Deleted

## 2014-12-11 MED ORDER — SIMVASTATIN 80 MG PO TABS: 40.0000 mg | ORAL_TABLET | Freq: Every day | ORAL | Status: DC

## 2014-12-20 ENCOUNTER — Encounter: Payer: Self-pay | Admitting: Family Medicine

## 2014-12-20 ENCOUNTER — Other Ambulatory Visit: Payer: Self-pay

## 2014-12-20 MED ORDER — LOSARTAN POTASSIUM 100 MG PO TABS: 100.0000 mg | ORAL_TABLET | Freq: Every day | ORAL | Status: DC

## 2014-12-21 ENCOUNTER — Other Ambulatory Visit: Payer: Self-pay | Admitting: Family Medicine

## 2015-01-11 NOTE — Progress Notes (Signed)
Patient ID: [name] [name], male   DOB: Jan 22, 1941, 74 y.o.   MRN: [id number] [name] [name] is seen today in F/U for CAD, HTN and elevated chol. Has a friend from [facility] living with him now since his wife died. Still selling rope. I believe he's had a stent to the OM in 96 and subsequent intervention to LAD. 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 He is not having any SSCP, palpitatoins SOB or edema. He has been compliant with his meds. His LDL has been under 100 with normal LFT's He has moderate carotid disease   Reviewed duplex from today 09/26/14  and stable  Left ICA 60-79% RICA 0-39%  Due for repeat July 2016    Has chronic venous insuf with occasional skin breakdown at ankles  Given handicap placard last visit   Had lopressor changes and beta blocker decreased to atenolol 12.5  mg for his CAD, PVC;s and HTN  Not bradycardic now  BP ok  Has right groin pain w/u primary negative no hernia   ROS: Denies fever, malais, weight loss, blurry vision, decreased visual acuity, cough, sputum, SOB, hemoptysis, pleuritic pain, palpitaitons, heartburn, abdominal pain, melena, lower extremity edema, claudication, or rash.  All other systems reviewed and negative  General: Affect appropriate Healthy:  appears stated age 74: normal Neck supple with no adenopathy JVP normal no bruits no thyromegaly Lungs clear with no wheezing and good diaphragmatic motion Heart:  S1/S2 no murmur, no rub, gallop or click PMI normal Abdomen: benighn, BS positve, no tenderness, no AAA no bruit.  No HSM or HJR Distal pulses intact with no bruits Trace bilateral edema with venouls insuf and erythema medial malleolus  Neuro non-focal Skin warm and dry No muscular weakness   Current Outpatient Prescriptions  Medication Sig Dispense Refill  . albuterol (PROAIR HFA) 108 (90 BASE) MCG/ACT inhaler Inhale 2 puffs into the lungs every 6 (six) hours as needed for wheezing. 1 Inhaler 2  . aspirin 325 MG tablet  Take 325 mg by mouth daily.      Marland Kitchen atenolol (TENORMIN) 25 MG tablet Take 0.5 tablets (12.5 mg total) by mouth daily. 45 tablet 3  . clopidogrel (PLAVIX) 75 MG tablet take 1 tablet by mouth daily 90 tablet 3  . doxazosin (CARDURA) 4 MG tablet take 1 tablet by mouth at bedtime 90 tablet 1  . losartan (COZAAR) 100 MG tablet Take 1 tablet (100 mg total) by mouth daily. 90 tablet 3  . metFORMIN (GLUCOPHAGE) 500 MG tablet take 1 tablet by mouth twice a day 60 tablet 11  . simvastatin (ZOCOR) 80 MG tablet Take 0.5 tablets (40 mg total) by mouth at bedtime. 45 tablet 0  . tadalafil (CIALIS) 5 MG tablet Take 1 tablet (5 mg total) by mouth daily as needed for erectile dysfunction. 30 tablet 0  . tiotropium (SPIRIVA) 18 MCG inhalation capsule Place 1 capsule (18 mcg total) into inhaler and inhale daily. 30 capsule 11   No current facility-administered medications for this visit.    Allergies  Review of patient's allergies indicates no known allergies.  Electrocardiogram:  08/05/12  SR rate 51 normal ECG    10/19/14  SR rate 45 mormal   Assessment and Plan

## 2015-01-14 ENCOUNTER — Ambulatory Visit (INDEPENDENT_AMBULATORY_CARE_PROVIDER_SITE_OTHER): Payer: Medicare Other | Admitting: Cardiovascular Disease

## 2015-01-14 ENCOUNTER — Encounter: Payer: Self-pay | Admitting: *Deleted

## 2015-01-14 ENCOUNTER — Encounter: Payer: Self-pay | Admitting: Cardiovascular Disease

## 2015-01-14 VITALS — BP 144/78 | HR 62 | Ht 71.0 in | Wt 200.0 lb

## 2015-01-14 DIAGNOSIS — I779 Disorder of arteries and arterioles, unspecified: Secondary | ICD-10-CM

## 2015-01-14 DIAGNOSIS — I2510 Atherosclerotic heart disease of native coronary artery without angina pectoris: Secondary | ICD-10-CM

## 2015-01-14 DIAGNOSIS — R1030 Lower abdominal pain, unspecified: Secondary | ICD-10-CM | POA: Diagnosis not present

## 2015-01-14 DIAGNOSIS — R1031 Right lower quadrant pain: Secondary | ICD-10-CM

## 2015-01-14 DIAGNOSIS — R001 Bradycardia, unspecified: Secondary | ICD-10-CM

## 2015-01-14 DIAGNOSIS — I10 Essential (primary) hypertension: Secondary | ICD-10-CM

## 2015-01-14 DIAGNOSIS — I739 Peripheral vascular disease, unspecified: Secondary | ICD-10-CM

## 2015-01-14 NOTE — Assessment & Plan Note (Signed)
Resolved on lower dose beta blocker 

## 2015-01-14 NOTE — Assessment & Plan Note (Signed)
No adenopathy or mass suspect he just has some tendon tightness CT abdomen June reviewed and just gallstones no inflammation or groin mass

## 2015-01-14 NOTE — Assessment & Plan Note (Signed)
Well controlled.  Continue current medications and low sodium Dash type diet.    

## 2015-01-14 NOTE — Patient Instructions (Addendum)
Medication Instructions:  No changes today.  Labwork: None today.  Testing/Procedures: Your physician has requested that you have a carotid duplex. This test is an ultrasound of the carotid arteries in your neck. It looks at blood flow through these arteries that supply the brain with blood. Allow one hour for this exam. There are no restrictions or special instructions. July 2016    Follow-Up: Your physician wants you to follow-up in: 6 months with Dr Johnsie Cancel. (October 2016),.You will receive a reminder letter in the mail two months in advance. If you don't receive a letter, please call our office to schedule the follow-up appointment.

## 2015-01-14 NOTE — Assessment & Plan Note (Signed)
Stable with no angina and good activity level.  Continue medical Rx  

## 2015-01-14 NOTE — Assessment & Plan Note (Signed)
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  No TIA symptoms.  Continue antiplatelet Rx and F/U carotid duplex in July

## 2015-01-18 ENCOUNTER — Telehealth: Payer: Self-pay | Admitting: *Deleted

## 2015-01-18 ENCOUNTER — Encounter: Payer: Self-pay | Admitting: Family Medicine

## 2015-01-18 ENCOUNTER — Ambulatory Visit (INDEPENDENT_AMBULATORY_CARE_PROVIDER_SITE_OTHER): Payer: Medicare Other | Admitting: Family Medicine

## 2015-01-18 VITALS — BP 142/63 | HR 64 | Wt 200.0 lb

## 2015-01-18 DIAGNOSIS — K5901 Slow transit constipation: Secondary | ICD-10-CM | POA: Diagnosis not present

## 2015-01-18 DIAGNOSIS — I10 Essential (primary) hypertension: Secondary | ICD-10-CM | POA: Diagnosis not present

## 2015-01-18 DIAGNOSIS — N400 Enlarged prostate without lower urinary tract symptoms: Secondary | ICD-10-CM

## 2015-01-18 DIAGNOSIS — L821 Other seborrheic keratosis: Secondary | ICD-10-CM | POA: Diagnosis not present

## 2015-01-18 DIAGNOSIS — J309 Allergic rhinitis, unspecified: Secondary | ICD-10-CM

## 2015-01-18 MED ORDER — TAMSULOSIN HCL 0.4 MG PO CAPS: 0.4000 mg | ORAL_CAPSULE | Freq: Every day | ORAL | Status: DC

## 2015-01-18 NOTE — Progress Notes (Signed)
Subjective:    Patient ID: [name] [name], male    DOB: [birth year], 74 y.o.   MRN: [id number]  HPI  He takes dulcolax dialy for 2-3 times. Has been taking this daily for 2-3 years.  Says the tramadol is really constipating him. Has ben getting some LLQ pain when lays down at night. Takes his laxative at night before betime. Had tried miralax in the past but doesn't give him the regularly. His pain is mostly located over the right groin crease. He says certain motions will trigger discomfort. It's not severe. It really doesn't stop him from doing any activities. He has not noticed a bulge such as a herniation. He notes that he does get a rash there intermittently. He usually just uses Cetaphil lotion and that gets it to clear up. He's had that on and off for years and it's not new.  BPH - Would like to switch to flomax.  Still getting up to urinate 3 times at night. He has a friend who takes Flomax and was like try it.  Lesion on right facial cheek. He would like ot frozen today.   AR- says the flonase work well with one squirt once a day and that is working really well. Sneezing, HA is much better.    Review of Systems     Objective:   Physical Exam  Constitutional: He is oriented to person, place, and time. He appears well-developed and well-nourished.  HENT:  Head: Normocephalic and atraumatic.  Cardiovascular: Normal rate, regular rhythm and normal heart sounds.   Pulmonary/Chest: Effort normal and breath sounds normal.  Abdominal: Soft. Bowel sounds are normal. He exhibits no distension and no mass. There is no tenderness. There is no rebound and no guarding.  Musculoskeletal:  Tender along the left groin crease. No pain with internal or external rotation of the hip. Hip strength is 5 out of 5 bilaterally. He's tender just over the crease but says he does have a rash there right now..   Neurological: He is alert and oriented to person, place, and time.  Skin: Skin is warm and dry.   Psychiatric: He has a normal mood and affect. His behavior is normal.          Assessment & Plan:  Constipation, chronic - he could certainly add a stool softener to his regimen. Recommend he take with breakfast and his evening meal and then continue with the Dulcolax at bedtime. This may help move things along. He is having bowel movements with Dulcolax but there still hard at times. I think the softener with help. Also encouraged him to make sure streaking plenty of fluids and staying hydrated.  BPH - discontinue Cardura. Start Flomax.  seb keratosis - cryotherapy performed. Patient tolerated well.  Allergic rhinitis-great results with Flonase.  Cryotherapy Procedure Note  Pre-operative Diagnosis: Seb keratosis  Post-operative Diagnosis: seborrheic keratosis  Locations:right facial cheek   Indications: cuts it when he shaves.  Anesthesia: not required    Procedure Details  Patient informed of risks (permanent scarring, infection, light or dark discoloration, bleeding, infection, weakness, numbness and recurrence of the lesion) and benefits of the procedure and verbal informed consent obtained.  The areas are treated with liquid nitrogen therapy, frozen until ice ball extended 2 mm beyond lesion, allowed to thaw, and treated again. The patient tolerated procedure well.  The patient was instructed on post-op care, warned that there may be blister formation, redness and pain. Recommend OTC analgesia as needed for  pain.  Condition: Stable  Complications: none.  Plan: 1. Instructed to keep the area dry and covered for 24-48h and clean thereafter. 2. Warning signs of infection were reviewed.   3. Recommended that the patient use OTC acetaminophen as needed for pain.  4. Return PRN.

## 2015-01-18 NOTE — Telephone Encounter (Signed)
Called pt to inform him that he will need to have la

## 2015-01-18 NOTE — Patient Instructions (Signed)
Hold the Cardura and take the Flomaax

## 2015-01-24 ENCOUNTER — Other Ambulatory Visit: Payer: Self-pay | Admitting: Cardiovascular Disease

## 2015-01-24 DIAGNOSIS — I6523 Occlusion and stenosis of bilateral carotid arteries: Secondary | ICD-10-CM

## 2015-04-05 ENCOUNTER — Other Ambulatory Visit: Payer: Self-pay | Admitting: Family Medicine

## 2015-04-05 MED ORDER — TRAMADOL HCL 50 MG PO TABS: 50.0000 mg | ORAL_TABLET | Freq: Three times a day (TID) | ORAL | Status: DC | PRN

## 2015-04-12 ENCOUNTER — Encounter: Payer: Self-pay | Admitting: Sports Medicine

## 2015-04-12 ENCOUNTER — Ambulatory Visit (INDEPENDENT_AMBULATORY_CARE_PROVIDER_SITE_OTHER): Payer: Medicare Other | Admitting: Sports Medicine

## 2015-04-12 VITALS — BP 194/93 | HR 62 | Ht 71.0 in | Wt 197.0 lb

## 2015-04-12 DIAGNOSIS — B356 Tinea cruris: Secondary | ICD-10-CM | POA: Diagnosis not present

## 2015-04-12 DIAGNOSIS — M19042 Primary osteoarthritis, left hand: Secondary | ICD-10-CM | POA: Diagnosis not present

## 2015-04-12 MED ORDER — CLOTRIMAZOLE-BETAMETHASONE 1-0.05 % EX CREA: 1.0000 "application " | TOPICAL_CREAM | Freq: Two times a day (BID) | CUTANEOUS | Status: DC

## 2015-04-12 MED ORDER — DICLOFENAC SODIUM 2 % TD SOLN: 2.0000 | Freq: Two times a day (BID) | TRANSDERMAL | Status: DC

## 2015-04-12 NOTE — Progress Notes (Signed)
  Subjective:    CC: Left hand pain, skin rash  HPI: Skin rash: Present for months, localized in the bilateral groin, minimally pruritic. Unsure as to what's causing it.  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 No radiation, no trauma.  Past medical history, Surgical history, Family history not pertinant except as noted below, Social history, Allergies, and medications have been entered into the medical record, reviewed, and no changes needed.   Review of Systems: No fevers, chills, night sweats, weight loss, chest pain, or shortness of breath.   Objective:    General: Well Developed, well nourished, and in no acute distress.  Neuro: Alert and oriented x3, extra-ocular muscles intact, sensation grossly intact.  HEENT: Normocephalic, atraumatic, pupils equal round reactive to light, neck supple, no masses, no lymphadenopathy, thyroid nonpalpable.  Skin: Warm and dry, scaling tinea cruris in the groin bilaterally. Cardiac: Regular rate and rhythm, no murmurs rubs or gallops, no lower extremity edema.  Respiratory: Clear to auscultation bilaterally. Not using accessory muscles, speaking in full sentences. Left hand: Tender to palpation at the left second MCP, no palpable triggering.  Impression and Recommendations:

## 2015-04-12 NOTE — Patient Instructions (Signed)
Jock Itch Jock itch is a fungal infection of the skin in the groin area. It is sometimes called "ringworm" even though it is not caused by a worm. A fungus is a type of germ that thrives in dark, damp places.  CAUSES  This infection may spread from:  A fungus infection elsewhere on the body (such as athlete's foot).  Sharing towels or clothing. This infection is more common in:  Hot, humid climates.  People who wear tight-fitting clothing or wet bathing suits for long periods of time.  Athletes.  Overweight people.  People with diabetes. SYMPTOMS  Jock itch causes the following symptoms:  Red, pink or brown rash in the groin. Rash may spread to the thighs, anus, and buttocks.  Itching. DIAGNOSIS  Your caregiver may make the diagnosis by looking at the rash. Sometimes a skin scraping will be sent to test for fungus. Testing can be done either by looking under the microscope or by doing a culture (test to try to grow the fungus). A culture can take up to 2 weeks to come back. TREATMENT  Jock itch may be treated with:  Skin cream or ointment to kill fungus.  Medicine by mouth to kill fungus.  Skin cream or ointment to calm the itching.  Compresses or medicated powders to dry the infected skin. HOME CARE INSTRUCTIONS   Be sure to treat the rash completely. Follow your caregiver's instructions. It can take a couple of weeks to treat. If you do not treat the infection long enough, the rash can come back.  Wear loose-fitting clothing.  Men should wear cotton boxer shorts.  Women should wear cotton underwear.  Avoid hot baths.  Dry the groin area well after bathing. SEEK MEDICAL CARE IF:   Your rash is worse.  Your rash is spreading.  Your rash returns after treatment is finished.  Your rash is not gone in 4 weeks. Fungal infections are slow to respond to treatment. Some redness may remain for several weeks after the fungus is gone. SEEK IMMEDIATE MEDICAL CARE  IF:  The area becomes red, warm, tender, and swollen.  You have a fever. Document Released: 08/28/2002 Document Revised: 11/30/2011 Document Reviewed: 07/27/2008 ExitCare Patient Information 2015 [employer]. This information is not intended to replace advice given to you by your health care provider. Make sure you discuss any questions you have with your health care provider.  

## 2015-04-12 NOTE — Assessment & Plan Note (Signed)
Lotrisone twice a day, return if no better in 2 weeks

## 2015-04-12 NOTE — Assessment & Plan Note (Signed)
Left third metacarpal phalangeal joint, we will simply use topical diclofenac, he declines injections or oral NSAIDs. Principal difficulty is range of motion when playing the guitar. X-rays.

## 2015-04-15 ENCOUNTER — Ambulatory Visit (HOSPITAL_COMMUNITY): Payer: Medicare Other | Attending: Cardiovascular Disease

## 2015-04-15 DIAGNOSIS — I6523 Occlusion and stenosis of bilateral carotid arteries: Secondary | ICD-10-CM | POA: Diagnosis not present

## 2015-05-27 ENCOUNTER — Other Ambulatory Visit: Payer: Self-pay | Admitting: Family Medicine

## 2015-06-10 ENCOUNTER — Ambulatory Visit (INDEPENDENT_AMBULATORY_CARE_PROVIDER_SITE_OTHER): Payer: Medicare Other | Admitting: Family Medicine

## 2015-06-10 ENCOUNTER — Encounter: Payer: Self-pay | Admitting: Family Medicine

## 2015-06-10 VITALS — BP 140/82 | HR 64 | Temp 98.7°F | Ht 71.0 in | Wt 196.0 lb

## 2015-06-10 DIAGNOSIS — Q828 Other specified congenital malformations of skin: Secondary | ICD-10-CM | POA: Diagnosis not present

## 2015-06-10 DIAGNOSIS — L821 Other seborrheic keratosis: Secondary | ICD-10-CM

## 2015-06-10 DIAGNOSIS — L299 Pruritus, unspecified: Secondary | ICD-10-CM | POA: Diagnosis not present

## 2015-06-10 DIAGNOSIS — E119 Type 2 diabetes mellitus without complications: Secondary | ICD-10-CM | POA: Diagnosis not present

## 2015-06-10 LAB — POCT UA - MICROALBUMIN
CREATININE, POC: 200 mg/dL
MICROALBUMIN (UR) POC: 150 mg/L

## 2015-06-10 LAB — POCT GLYCOSYLATED HEMOGLOBIN (HGB A1C): Hemoglobin A1C: 6.7

## 2015-06-10 NOTE — Addendum Note (Signed)
Addended by: [name] on: 06/10/2015 04:54 PM   Modules accepted: Orders

## 2015-06-10 NOTE — Progress Notes (Signed)
Subjective:    Patient ID: [name] [name], male    DOB: 01-Jul-1941, 74 y.o.   MRN: [id number]  HPI Diabetes - no hypoglycemic events. No wounds or sores that are not healing well. No increased thirst or urination. Checking glucose at home. Taking medications as prescribed without any side effects.  Skin has felt more itchy. It moves around.  Lasts a few minutes.  Then resolves. Not sure if dry skin. No new soaps, lotions.   He denies any actual rash. No recent medication changes.  Has some skin tags.  On his face  He would like removed. He says he typically just remove some himself with clippers but wasn't able to get these well. He also has some brown spots on his face that he had previously discussed with me before. They are consistent with seborrheic keratoses and he would like those frozen and treated.  Review of Systems     Objective:   Physical Exam  Constitutional: He is oriented to person, place, and time. He appears well-developed and well-nourished.  HENT:  Head: Normocephalic and atraumatic.  Cardiovascular: Normal rate, regular rhythm and normal heart sounds.   Pulmonary/Chest: Effort normal and breath sounds normal.  Neurological: He is alert and oriented to person, place, and time.  Skin: Skin is warm and dry.  Skin tag near lateral corner of the left eye and near eyebrow ridge on the right.  Scattered seborrheic keratoses particularly over the temples and cheeks on the face. And he has 3 on his back that he would like frozen as well.   Psychiatric: He has a normal mood and affect. His behavior is normal.          Assessment & Plan:  DM - well controlled.  A1C is 6.7 , which is up a little bit from previous. Just make sure working on diet and exercise.Marland Kitchen Pos urine micro but is on losartan.   Otherwise, continue current regimen. Follow-up in 3 months.  Itch - discussed can recheck liver enzymes. Could be dry skin vs medication vx. Intolerance to fabric softner.  May  want to look over current productshe is currently using and even switch to have hypo - allergenic formulas.  Skin tags - discussed treatment.   Seb keratoses - Cryotherapy performed and patient tolerated well.    Skin Tag Removal Procedure Note  Pre-operative Diagnosis: Classic skin tags (acrochordon)  Post-operative Diagnosis: Classic skin tags (acrochordon)  Locations:near lateral corner of the left eye and near eyebrow ridge on the right.     patient prefers removal.   Anesthesia: not required    Procedure Details  The risks (including bleeding and infection) and benefits of the procedure and Verbal informed consent obtained. Using sterile iris scissors, multiple skin tags were snipped off at their bases after cleansing with Betadine.  Bleeding was controlled by pressure.   Findings: Pathognomonic benign lesions  not sent for pathological exam.  Condition: Stable  Complications: none.  Plan: 1. Instructed to keep the wounds dry and covered for 24-48h and clean thereafter. 2. Warning signs of infection were reviewed.   3. Recommended that the patient use OTC acetaminophen as needed for pain.  4. Return as needed.  Cryotherapy Procedure Note  Pre-operative Diagnosis: seborrheic keratoses  Post-operative Diagnosis: seborrheic keratosis.  Locations: face over temples and cheeks and 3 on his back , 15 total frozen.    Indications: patient desire them gone  Anesthesia: not required    Procedure Details  Patient  informed of risks (permanent scarring, infection, light or dark discoloration, bleeding, infection, weakness, numbness and recurrence of the lesion) and benefits of the procedure and verbal informed consent obtained.  The areas are treated with liquid nitrogen therapy, frozen until ice ball extended 1-2 mm beyond lesion, allowed to thaw, and treated again. The patient tolerated procedure well.  The patient was instructed on post-op care, warned that there may be  blister formation, redness and pain. Recommend OTC analgesia as needed for pain.  Condition: Stable  Complications: none.  Plan: 1. Instructed to keep the area dry and covered for 24-48h and clean thereafter. 2. Warning signs of infection were reviewed.   3. Recommended that the patient use OTC acetaminophen as needed for pain.  4. Return PRN.

## 2015-06-24 ENCOUNTER — Other Ambulatory Visit: Payer: Self-pay | Admitting: Family Medicine

## 2015-07-16 ENCOUNTER — Other Ambulatory Visit: Payer: Self-pay | Admitting: Family Medicine

## 2015-08-01 NOTE — Progress Notes (Signed)
Patient ID: [name] [name], male   DOB: [birth year], 74 y.o.   MRN: [id number] Sivan is seen today in F/U for CAD, HTN and elevated chol. Has a friend from [facility] living with him now since his wife died. Still selling rope. I believe he's had a stent to the OM in 96 and subsequent intervention to LAD. 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 He is not having any SSCP, palpitatoins SOB or edema. He has been compliant with his meds. His LDL has been under 100 with normal LFT's He has moderate carotid disease   Reviewed duplex from today 09/26/14  and stable  Left ICA 60-79% RICA 0-39%  Due for repeat July 2016  No change   Has chronic venous insuf with occasional skin breakdown at ankles  Given handicap placard last visit   Had lopressor changes and beta blocker decreased to atenolol 12.5  mg for his CAD, PVC;s and HTN  Not bradycardic now  BP ok  Has right groin pain w/u primary negative no hernia   ROS: Denies fever, malais, weight loss, blurry vision, decreased visual acuity, cough, sputum, SOB, hemoptysis, pleuritic pain, palpitaitons, heartburn, abdominal pain, melena, lower extremity edema, claudication, or rash.  All other systems reviewed and negative  General: Affect appropriate Healthy:  appears stated age 79: normal Neck supple with no adenopathy JVP normal left  bruits no thyromegaly Lungs clear with no wheezing and good diaphragmatic motion Heart:  S1/S2 no murmur, no rub, gallop or click PMI normal Abdomen: benighn, BS positve, no tenderness, no AAA no bruit.  No HSM or HJR Distal pulses intact with no bruits Trace bilateral edema with venouls insuf and erythema medial malleolus  Neuro non-focal Skin warm and dry No muscular weakness   Current Outpatient Prescriptions  Medication Sig Dispense Refill  . albuterol (PROAIR HFA) 108 (90 BASE) MCG/ACT inhaler Inhale 2 puffs into the lungs every 6 (six) hours as needed for wheezing. 1 Inhaler 2  . aspirin  325 MG tablet Take 325 mg by mouth daily.      Marland Kitchen atenolol (TENORMIN) 25 MG tablet Take 0.5 tablets (12.5 mg total) by mouth daily. 45 tablet 3  . clopidogrel (PLAVIX) 75 MG tablet take 1 tablet by mouth daily 90 tablet 3  . losartan (COZAAR) 100 MG tablet Take 1 tablet (100 mg total) by mouth daily. 90 tablet 3  . metFORMIN (GLUCOPHAGE) 500 MG tablet take 1 tablet by mouth twice a day 60 tablet 11  . simvastatin (ZOCOR) 80 MG tablet Take 0.5 tablets (40 mg total) by mouth at bedtime. 45 tablet 3  . tamsulosin (FLOMAX) 0.4 MG CAPS capsule take 1 capsule by mouth once daily 30 capsule 3  . tiotropium (SPIRIVA) 18 MCG inhalation capsule Place 1 capsule (18 mcg total) into inhaler and inhale daily. 30 capsule 11  . traMADol (ULTRAM) 50 MG tablet take 1 tablet by mouth twice a day 30 tablet 0   No current facility-administered medications for this visit.    Allergies  Review of patient's allergies indicates no known allergies.  Electrocardiogram:  08/05/12  SR rate 51 normal ECG    10/19/14  SR rate 45 mormal   Assessment and Plan CAD: distant stent OM 96 continue medical Rx HTN:  Well controlled.  Continue current medications and low sodium Dash type diet.   Bradycardia:  Resolved on lower dose beta blocker Chol:   Cholesterol is at goal.  Continue current dose of statin and  diet Rx.  No myalgias or side effects.  F/U  LFT's in 6 months. Lab Results  Component Value Date   LDLCALC 48 10/12/2013  Bruit:  Left ICA 60-79% stenosis.  No TIA symptoms.  Continue antiplatelet Rx and F/U carotid duplex iJuly 2017

## 2015-08-02 ENCOUNTER — Ambulatory Visit (INDEPENDENT_AMBULATORY_CARE_PROVIDER_SITE_OTHER): Payer: Medicare Other | Admitting: Cardiovascular Disease

## 2015-08-02 ENCOUNTER — Encounter: Payer: Self-pay | Admitting: Cardiovascular Disease

## 2015-08-02 VITALS — BP 142/74 | HR 52 | Ht 71.0 in | Wt 198.2 lb

## 2015-08-02 DIAGNOSIS — I779 Disorder of arteries and arterioles, unspecified: Secondary | ICD-10-CM | POA: Diagnosis not present

## 2015-08-02 DIAGNOSIS — I10 Essential (primary) hypertension: Secondary | ICD-10-CM | POA: Diagnosis not present

## 2015-08-02 DIAGNOSIS — I739 Peripheral vascular disease, unspecified: Secondary | ICD-10-CM

## 2015-08-02 MED ORDER — SIMVASTATIN 80 MG PO TABS: 40.0000 mg | ORAL_TABLET | Freq: Every day | ORAL | Status: DC

## 2015-08-02 NOTE — Patient Instructions (Addendum)
Medication Instructions:  Your physician recommends that you continue on your current medications as directed. Please refer to the Current Medication list given to you today.   Labwork: NONE  Testing/Procedures: Your physician has requested that you have a carotid duplex in JUNE. This test is an ultrasound of the carotid arteries in your neck. It looks at blood flow through these arteries that supply the brain with blood. Allow one hour for this exam. There are no restrictions or special instructions.  Follow-Up: Your physician wants you to follow-up in: [place] with Dr. Johnsie Cancel. You will receive a reminder letter in the mail two months in advance. If you don't receive a letter, please call our office to schedule the follow-up appointment.  If you need a refill on your cardiac medications before your next appointment, please call your pharmacy.

## 2015-08-26 ENCOUNTER — Other Ambulatory Visit: Payer: Self-pay | Admitting: *Deleted

## 2015-08-26 MED ORDER — ATENOLOL 25 MG PO TABS: 12.5000 mg | ORAL_TABLET | Freq: Every day | ORAL | Status: DC

## 2015-09-09 ENCOUNTER — Other Ambulatory Visit: Payer: Self-pay | Admitting: Family Medicine

## 2015-09-09 ENCOUNTER — Ambulatory Visit (INDEPENDENT_AMBULATORY_CARE_PROVIDER_SITE_OTHER): Payer: Medicare Other | Admitting: Family Medicine

## 2015-09-09 ENCOUNTER — Encounter: Payer: Self-pay | Admitting: Family Medicine

## 2015-09-09 VITALS — BP 212/86 | HR 85 | Wt 199.0 lb

## 2015-09-09 DIAGNOSIS — L299 Pruritus, unspecified: Secondary | ICD-10-CM

## 2015-09-09 DIAGNOSIS — Z8546 Personal history of malignant neoplasm of prostate: Secondary | ICD-10-CM

## 2015-09-09 DIAGNOSIS — E119 Type 2 diabetes mellitus without complications: Secondary | ICD-10-CM | POA: Diagnosis not present

## 2015-09-09 DIAGNOSIS — K5901 Slow transit constipation: Secondary | ICD-10-CM

## 2015-09-09 DIAGNOSIS — C61 Malignant neoplasm of prostate: Secondary | ICD-10-CM | POA: Diagnosis not present

## 2015-09-09 DIAGNOSIS — I10 Essential (primary) hypertension: Secondary | ICD-10-CM | POA: Diagnosis not present

## 2015-09-09 LAB — POCT GLYCOSYLATED HEMOGLOBIN (HGB A1C): HEMOGLOBIN A1C: 7.5

## 2015-09-09 NOTE — Progress Notes (Signed)
   Subjective:    Patient ID: [name] [name], male    DOB: [birth year], 74 y.o.   MRN: [id number]  HPI Diabetes - no hypoglycemic events. No wounds or sores that are not healing well. No increased thirst or urination. Checking glucose at home. Taking medications as prescribed without any side effects. Has been eating cookies, etc fot he last week.   Hypertension- Pt denies chest pain, SOB, dizziness, or heart palpitations.  Taking meds as directed w/o problems.  Denies medication side effects.    Was constipated for awhile but has ben taking stool softener and has been doing well overall since taking that.  Marland Kitchen    Has had very itchy eyes in the AM. Says then will start to itch on his scalp and arms.  No rashes. Using flonase once a day and says it really helps his nasal sxs.     Review of Systems     Objective:   Physical Exam  Constitutional: He is oriented to person, place, and time. He appears well-developed and well-nourished.  HENT:  Head: Normocephalic and atraumatic.  Right Ear: External ear normal.  Left Ear: External ear normal.  Nose: Nose normal.  Mouth/Throat: Oropharynx is clear and moist.  TMs and canals are clear.   Eyes: Conjunctivae and EOM are normal. Pupils are equal, round, and reactive to light.  Neck: Neck supple. No thyromegaly present.  Cardiovascular: Normal rate and normal heart sounds.   Pulmonary/Chest: Effort normal and breath sounds normal.  Lymphadenopathy:    He has no cervical adenopathy.  Neurological: He is alert and oriented to person, place, and time.  Skin: Skin is warm and dry.  Psychiatric: He has a normal mood and affect.          Assessment & Plan:  DM- He has schedule an eye appt.  Well controlled.  F/U in 3 moths.    HTN - uncontrolled. He didn't take his meds this AM.  Really watch salt intake and make sure taking meds regularly.  F/U in 1 week.     Itching - will check liver enzymes and thyroid.  Since concentrated around his eyes  he plans on seeing optometry soon as well. Could be allergic eye or dry eye.    Constipation - Can use softener or miralax PRN.

## 2015-09-10 LAB — TSH: TSH: 2.888 u[IU]/mL (ref 0.350–4.500)

## 2015-09-10 LAB — PSA: PSA: 0.39 ng/mL (ref <=4.00)

## 2015-09-11 LAB — COMPLETE METABOLIC PANEL WITH GFR
ALT: 34 U/L (ref 9–46)
AST: 25 U/L (ref 10–35)
Albumin: 4.1 g/dL (ref 3.6–5.1)
Alkaline Phosphatase: 97 U/L (ref 40–115)
BUN: 15 mg/dL (ref 7–25)
CALCIUM: 9.5 mg/dL (ref 8.6–10.3)
CHLORIDE: 101 mmol/L (ref 98–110)
CO2: 24 mmol/L (ref 20–31)
Creat: 0.88 mg/dL (ref 0.70–1.18)
GFR, Est African American: >89 mL/min (ref >=60)
GFR, Est Non African American: 85 mL/min (ref >=60)
Glucose, Bld: 182 mg/dL — ABNORMAL HIGH (ref 65–99)
POTASSIUM: 4.3 mmol/L (ref 3.5–5.3)
SODIUM: 137 mmol/L (ref 135–146)
Total Bilirubin: 0.5 mg/dL (ref 0.2–1.2)
Total Protein: 7.2 g/dL (ref 6.1–8.1)

## 2015-09-11 LAB — LIPID PANEL
CHOL/HDL RATIO: 3.9 ratio (ref <=5.0)
CHOLESTEROL: 135 mg/dL (ref 125–200)
HDL: 35 mg/dL — ABNORMAL LOW (ref >=40)
LDL CALC: 69 mg/dL (ref <130)
TRIGLYCERIDES: 153 mg/dL — AB (ref <150)
VLDL: 31 mg/dL — AB (ref <30)

## 2015-09-16 DIAGNOSIS — Z79899 Other long term (current) drug therapy: Secondary | ICD-10-CM | POA: Diagnosis not present

## 2015-09-16 DIAGNOSIS — Z7982 Long term (current) use of aspirin: Secondary | ICD-10-CM | POA: Diagnosis not present

## 2015-09-16 DIAGNOSIS — Z7984 Long term (current) use of oral hypoglycemic drugs: Secondary | ICD-10-CM | POA: Diagnosis not present

## 2015-09-16 DIAGNOSIS — E119 Type 2 diabetes mellitus without complications: Secondary | ICD-10-CM | POA: Diagnosis not present

## 2015-09-16 DIAGNOSIS — J449 Chronic obstructive pulmonary disease, unspecified: Secondary | ICD-10-CM | POA: Diagnosis not present

## 2015-09-16 DIAGNOSIS — J45909 Unspecified asthma, uncomplicated: Secondary | ICD-10-CM | POA: Diagnosis not present

## 2015-09-16 DIAGNOSIS — I252 Old myocardial infarction: Secondary | ICD-10-CM | POA: Diagnosis not present

## 2015-09-16 DIAGNOSIS — I10 Essential (primary) hypertension: Secondary | ICD-10-CM | POA: Diagnosis not present

## 2015-09-16 DIAGNOSIS — R1013 Epigastric pain: Secondary | ICD-10-CM | POA: Diagnosis not present

## 2015-09-16 DIAGNOSIS — R1012 Left upper quadrant pain: Secondary | ICD-10-CM | POA: Diagnosis not present

## 2015-09-17 ENCOUNTER — Ambulatory Visit (INDEPENDENT_AMBULATORY_CARE_PROVIDER_SITE_OTHER): Payer: Medicare Other | Admitting: Family Medicine

## 2015-09-17 ENCOUNTER — Encounter: Payer: Self-pay | Admitting: Family Medicine

## 2015-09-17 ENCOUNTER — Telehealth: Payer: Self-pay

## 2015-09-17 VITALS — BP 160/89 | HR 60 | Wt 198.0 lb

## 2015-09-17 DIAGNOSIS — J449 Chronic obstructive pulmonary disease, unspecified: Secondary | ICD-10-CM | POA: Diagnosis not present

## 2015-09-17 DIAGNOSIS — R1013 Epigastric pain: Secondary | ICD-10-CM

## 2015-09-17 DIAGNOSIS — I10 Essential (primary) hypertension: Secondary | ICD-10-CM | POA: Diagnosis not present

## 2015-09-17 MED ORDER — ATORVASTATIN CALCIUM 40 MG PO TABS: 40.0000 mg | ORAL_TABLET | Freq: Every day | ORAL | Status: DC

## 2015-09-17 MED ORDER — AMLODIPINE BESYLATE 5 MG PO TABS: 5.0000 mg | ORAL_TABLET | Freq: Every day | ORAL | Status: DC

## 2015-09-17 NOTE — Telephone Encounter (Signed)
-----   Message from Hali Marry, MD sent at 09/16/2015  7:48 PM EST ----- See note below

## 2015-09-17 NOTE — Telephone Encounter (Signed)
Patient aware of lab results and recommendations. 

## 2015-09-17 NOTE — Progress Notes (Signed)
   Subjective:    Patient ID: [name] [name], male    DOB: 1940/12/29, 74 y.o.   MRN: [id number]  HPI He is here for f/U for blood pressure. It was very high when he came in 2 weeks ago.  Says he went to the ED on Christmas Eve for abdominal pain but they didn't find a source. He has been battling some constipation but has been taking stool softeners.  Says it was epigastric pain and was a burning sensation.  They did CXR and EKG and these were normal.   His epigstric pain has completely resolved He has not had any reccurence of symptoms and his BMs have been normal. Asked if he thought he could have had food poisoning or ate something bad and he says it's certainly possible.   Review of Systems     Objective:   Physical Exam  Constitutional: He is oriented to person, place, and time. He appears well-developed and well-nourished.  HENT:  Head: Normocephalic and atraumatic.  Right Ear: External ear normal.  Left Ear: External ear normal.  Nose: Nose normal.  Mouth/Throat: Oropharynx is clear and moist.  TMs and canals are clear.   Eyes: Conjunctivae and EOM are normal. Pupils are equal, round, and reactive to light.  Neck: Neck supple. No thyromegaly present.  Cardiovascular: Normal rate and normal heart sounds.   Pulmonary/Chest: Effort normal and breath sounds normal.  Lymphadenopathy:    He has no cervical adenopathy.  Neurological: He is alert and oriented to person, place, and time.  Skin: Skin is warm and dry.  Psychiatric: He has a normal mood and affect.          Assessment & Plan:  HTN - uncontrolled. Add amlodipine.  F/U in 4 weeks.    COPD - will see if can get samples of spiriva respimat.    epigastric pain - Unknown cause.  My have been gastritis.  His symptoms are completley resolved right now.

## 2015-09-20 ENCOUNTER — Other Ambulatory Visit: Payer: Self-pay | Admitting: Family Medicine

## 2015-09-20 ENCOUNTER — Other Ambulatory Visit: Payer: Self-pay

## 2015-09-20 MED ORDER — CLOPIDOGREL BISULFATE 75 MG PO TABS: ORAL_TABLET | ORAL | Status: DC

## 2015-10-18 DIAGNOSIS — R351 Nocturia: Secondary | ICD-10-CM | POA: Diagnosis not present

## 2015-10-18 DIAGNOSIS — Z0000 Encounter for general adult medical examination without abnormal findings: Secondary | ICD-10-CM | POA: Diagnosis not present

## 2015-10-18 DIAGNOSIS — Z8546 Personal history of malignant neoplasm of prostate: Secondary | ICD-10-CM | POA: Diagnosis not present

## 2015-10-21 ENCOUNTER — Ambulatory Visit (INDEPENDENT_AMBULATORY_CARE_PROVIDER_SITE_OTHER): Payer: Medicare Other | Admitting: Family Medicine

## 2015-10-21 ENCOUNTER — Encounter: Payer: Self-pay | Admitting: Family Medicine

## 2015-10-21 VITALS — BP 168/55 | HR 65 | Wt 194.0 lb

## 2015-10-21 DIAGNOSIS — J449 Chronic obstructive pulmonary disease, unspecified: Secondary | ICD-10-CM

## 2015-10-21 DIAGNOSIS — I10 Essential (primary) hypertension: Secondary | ICD-10-CM | POA: Diagnosis not present

## 2015-10-21 MED ORDER — METOPROLOL SUCCINATE ER 100 MG PO TB24: 100.0000 mg | ORAL_TABLET | Freq: Every day | ORAL | Status: DC

## 2015-10-21 MED ORDER — TAMSULOSIN HCL 0.4 MG PO CAPS: 0.4000 mg | ORAL_CAPSULE | Freq: Every day | ORAL | Status: DC

## 2015-10-21 NOTE — Patient Instructions (Signed)
I'm increasing the Metoprolol to 100mg  a day.

## 2015-10-21 NOTE — Progress Notes (Signed)
   Subjective:    Patient ID: [name] [name], male    DOB: 1940-10-13, 75 y.o.   MRN: [id number]  HPI Follow-up hypertension-the last office visit about a month ago he came his blood pressure was very uncontrolled. We decided to add amlodipine to his regimen and have him follow-up today. He has cut out salt.  He hasn't been exercising.    COPD - a few weeks ago had a cough, some SOB and green sputum.  No fever. He says this last week he has actually felt better and has noticed dec in sputurm production. No longer SOB and cough is resolving. He didn't receive any treatment.    Review of Systems     Objective:   Physical Exam  Constitutional: He is oriented to person, place, and time. He appears well-developed and well-nourished.  HENT:  Head: Normocephalic and atraumatic.  Cardiovascular: Normal rate, regular rhythm and normal heart sounds.   Pulmonary/Chest: Effort normal and breath sounds normal.  Neurological: He is alert and oriented to person, place, and time.  Skin: Skin is warm and dry.  Psychiatric: He has a normal mood and affect. His behavior is normal.          Assessment & Plan:  HTN - Bp still high but better than 4 weeks ago and better than at his cardiologist office.  Increase metoprolol to 100mg .  F/U in 1 month for BP check.    COPD - doing well overall. Says like he may have had some recent virla illness. Sounds like he is getting better but if suddenly gets worse or green sputum doesn't clear then call us back.

## 2015-10-28 ENCOUNTER — Ambulatory Visit (HOSPITAL_COMMUNITY)
Admission: RE | Admit: 2015-10-28 | Discharge: 2015-10-28 | Disposition: A | Discharge status: Home / Self Care | Payer: Medicare Other | Source: Ambulatory Visit | Attending: Cardiovascular Disease | Admitting: Cardiovascular Disease

## 2015-10-28 DIAGNOSIS — I779 Disorder of arteries and arterioles, unspecified: Secondary | ICD-10-CM | POA: Diagnosis not present

## 2015-10-28 DIAGNOSIS — E119 Type 2 diabetes mellitus without complications: Secondary | ICD-10-CM | POA: Insufficient documentation

## 2015-10-28 DIAGNOSIS — E7800 Pure hypercholesterolemia, unspecified: Secondary | ICD-10-CM | POA: Diagnosis not present

## 2015-10-28 DIAGNOSIS — I6523 Occlusion and stenosis of bilateral carotid arteries: Secondary | ICD-10-CM | POA: Diagnosis not present

## 2015-10-28 DIAGNOSIS — I10 Essential (primary) hypertension: Secondary | ICD-10-CM

## 2015-10-28 DIAGNOSIS — I739 Peripheral vascular disease, unspecified: Secondary | ICD-10-CM

## 2015-11-08 ENCOUNTER — Ambulatory Visit (INDEPENDENT_AMBULATORY_CARE_PROVIDER_SITE_OTHER): Payer: Medicare Other | Admitting: Family Medicine

## 2015-11-08 VITALS — BP 155/56 | HR 47

## 2015-11-08 DIAGNOSIS — I10 Essential (primary) hypertension: Secondary | ICD-10-CM | POA: Diagnosis not present

## 2015-11-08 MED ORDER — AMLODIPINE BESYLATE 10 MG PO TABS: 10.0000 mg | ORAL_TABLET | Freq: Every day | ORAL | Status: DC

## 2015-11-08 NOTE — Progress Notes (Signed)
Patient came into clinic today for blood pressure check. Pt BP was 160/55 on first check, then 155/56 after sitting for 10 minutes. Pt reports taking his BP Rx's at night. Spoke with PCP, increasing Amlodipine to 10mg  daily and Pt was advised to take this Rx in the am. Verbalized understanding. Pt also states his "other provider" wants him to start Avodart but he has concern since he is already taking Doxazosin and Flomax. Spoke with PCP, advised Pt to begin the Avodart and take with EITHER Doxazosin or Flomax. Pt states he has one week left of Flomax so he will use it then switch to Doxazosin. Advised that was fine but not to take all three together. Verbalized understanding. No further questions at this time.

## 2015-11-08 NOTE — Progress Notes (Signed)
Agree with below. \Taite Schoeppner, MD  

## 2015-11-11 DIAGNOSIS — E119 Type 2 diabetes mellitus without complications: Secondary | ICD-10-CM | POA: Diagnosis not present

## 2015-11-11 DIAGNOSIS — H26491 Other secondary cataract, right eye: Secondary | ICD-10-CM | POA: Diagnosis not present

## 2015-11-11 DIAGNOSIS — H26492 Other secondary cataract, left eye: Secondary | ICD-10-CM | POA: Diagnosis not present

## 2015-11-11 DIAGNOSIS — H524 Presbyopia: Secondary | ICD-10-CM | POA: Diagnosis not present

## 2015-11-11 LAB — HM DIABETES EYE EXAM

## 2015-11-21 ENCOUNTER — Encounter: Payer: Self-pay | Admitting: Family Medicine

## 2015-11-22 ENCOUNTER — Ambulatory Visit (INDEPENDENT_AMBULATORY_CARE_PROVIDER_SITE_OTHER): Payer: Medicare Other | Admitting: Family Medicine

## 2015-11-22 ENCOUNTER — Encounter: Payer: Self-pay | Admitting: Family Medicine

## 2015-11-22 VITALS — BP 160/60 | HR 63 | Wt 201.0 lb

## 2015-11-22 DIAGNOSIS — I878 Other specified disorders of veins: Secondary | ICD-10-CM

## 2015-11-22 DIAGNOSIS — J019 Acute sinusitis, unspecified: Secondary | ICD-10-CM | POA: Diagnosis not present

## 2015-11-22 MED ORDER — AMOXICILLIN-POT CLAVULANATE 875-125 MG PO TABS: 1.0000 | ORAL_TABLET | Freq: Two times a day (BID) | ORAL | Status: DC

## 2015-11-22 MED ORDER — METFORMIN HCL ER 500 MG PO TB24: 500.0000 mg | ORAL_TABLET | Freq: Every day | ORAL | Status: DC

## 2015-11-22 MED ORDER — TRAMADOL HCL 50 MG PO TABS: 50.0000 mg | ORAL_TABLET | Freq: Two times a day (BID) | ORAL | Status: DC

## 2015-11-22 NOTE — Progress Notes (Signed)
   Subjective:    Patient ID: [name] [name], male    DOB: 11-02-1940, 75 y.o.   MRN: [id number]  HPI 75 yo c/o blowing nose and coughing up thick green mucus, sinus pressure, no taste x 3 weeks. No fever, chills or sweats. Says was sick for about 2 weeks and then started to feel a little better for about 5 days and then started ot feel worse again. No OTC meds.    He has also noticed some swelling around his ankles when he drives for more than a couple of hours.  Says it resolves by the next day. Worse on the right compared to the lef.t  He is not having any calf pain or tenderness.  Review of Systems     Objective:   Physical Exam  Constitutional: He is oriented to person, place, and time. He appears well-developed and well-nourished.  HENT:  Head: Normocephalic and atraumatic.  Right Ear: External ear normal.  Left Ear: External ear normal.  Nose: Nose normal.  Mouth/Throat: Oropharynx is clear and moist.  TMs and canals are clear.   Eyes: Conjunctivae and EOM are normal. Pupils are equal, round, and reactive to light.  Neck: Neck supple. No thyromegaly present.  Cardiovascular: Normal rate and normal heart sounds.   Pulmonary/Chest: Effort normal and breath sounds normal.  Musculoskeletal:  Trace swelling in the left ankle, 1+ edema of the right ankle.  Lymphadenopathy:    He has no cervical adenopathy.  Neurological: He is alert and oriented to person, place, and time.  Skin: Skin is warm and dry.  Psychiatric: He has a normal mood and affect.          Assessment & Plan:  Acute sinusitis  - will tx w/ Augmentin. Call if not better in one week.    Venous stasis - Discussed diagnosis. Recommend compression stockings especially if he is going to drive long distances. This is not an acute issue some not worried about a DVT. He also discussed not driving for long periods of time without taking a break and stopping and moving around to help with circulation.

## 2015-11-22 NOTE — Patient Instructions (Signed)

## 2015-11-24 ENCOUNTER — Other Ambulatory Visit: Payer: Self-pay | Admitting: Family Medicine

## 2015-12-18 ENCOUNTER — Telehealth: Payer: Self-pay | Admitting: Family Medicine

## 2015-12-18 ENCOUNTER — Encounter: Payer: Self-pay | Admitting: Family Medicine

## 2015-12-18 ENCOUNTER — Ambulatory Visit (INDEPENDENT_AMBULATORY_CARE_PROVIDER_SITE_OTHER): Payer: Medicare Other | Admitting: Family Medicine

## 2015-12-18 ENCOUNTER — Other Ambulatory Visit: Payer: Self-pay | Admitting: Cardiovascular Disease

## 2015-12-18 VITALS — BP 140/72 | HR 56 | Wt 198.0 lb

## 2015-12-18 DIAGNOSIS — I878 Other specified disorders of veins: Secondary | ICD-10-CM

## 2015-12-18 DIAGNOSIS — I10 Essential (primary) hypertension: Secondary | ICD-10-CM

## 2015-12-18 NOTE — Telephone Encounter (Signed)
Pt informed.[name] [name] [name] [name]  

## 2015-12-18 NOTE — Telephone Encounter (Signed)
Please call patient: Let him know that he will need to continue the Plavix much for the rest of his life. Because of his peripheral vascular disease.

## 2015-12-18 NOTE — Progress Notes (Signed)
   Subjective:    Patient ID: [name] [name], male    DOB: July 27, 1941, 75 y.o.   MRN: [id number]  HPI Hypertension- Pt denies chest pain, SOB, dizziness, or heart palpitations.  Taking meds as directed w/o problems.  Denies medication side effects.  He says he actually realize he was not taking his metoprolol when he was reviewing the medication list. He also has not been taking his atorvastatin. He wonders if he would benefit from a diuretic. He is also taking one of the prescription that he can come to the name of and the says he does not see it on his medication list here but he says he will call us back and let us know.  Follow-up venous stasis and-he was able to find some compression stockings at a store in [place]. He has not been wearing them consistently but just occasionally. He does feel like overall the swelling in his lower legs is much better than it was.   Review of Systems     Objective:   Physical Exam  Constitutional: He is oriented to person, place, and time. He appears well-developed and well-nourished.  HENT:  Head: Normocephalic and atraumatic.  Cardiovascular: Normal rate, regular rhythm and normal heart sounds.   Pulmonary/Chest: Effort normal and breath sounds normal.  Musculoskeletal:  1+ pitting edema around both ankles today is have some discoloration  Neurological: He is alert and oriented to person, place, and time.  Skin: Skin is warm and dry.  Psychiatric: He has a normal mood and affect. His behavior is normal.          Assessment & Plan:  Hypertension-uncontrolled. Encouraged him to make sure he gets back on the metoprolol. He may need to call the pharmacy for refills. 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  Venous stasis - consider diuretic. I want to wait until he lets his no additional prescription he is taking so doesn't cross  react. But certainly we can consider a diuretic for him to use on occasion. Continue with use of compression stockings. And continue to watch salt intake.

## 2015-12-23 ENCOUNTER — Ambulatory Visit: Payer: Medicare Other | Admitting: Family Medicine

## 2016-01-07 ENCOUNTER — Other Ambulatory Visit: Payer: Self-pay | Admitting: Family Medicine

## 2016-01-13 ENCOUNTER — Other Ambulatory Visit: Payer: Self-pay | Admitting: Family Medicine

## 2016-01-20 ENCOUNTER — Ambulatory Visit (INDEPENDENT_AMBULATORY_CARE_PROVIDER_SITE_OTHER): Payer: Medicare Other | Admitting: Family Medicine

## 2016-01-20 ENCOUNTER — Encounter: Payer: Self-pay | Admitting: Family Medicine

## 2016-01-20 VITALS — BP 140/62 | HR 50 | Wt 197.0 lb

## 2016-01-20 DIAGNOSIS — I70219 Atherosclerosis of native arteries of extremities with intermittent claudication, unspecified extremity: Secondary | ICD-10-CM

## 2016-01-20 DIAGNOSIS — E119 Type 2 diabetes mellitus without complications: Secondary | ICD-10-CM

## 2016-01-20 DIAGNOSIS — J449 Chronic obstructive pulmonary disease, unspecified: Secondary | ICD-10-CM

## 2016-01-20 DIAGNOSIS — L03115 Cellulitis of right lower limb: Secondary | ICD-10-CM | POA: Diagnosis not present

## 2016-01-20 LAB — POCT GLYCOSYLATED HEMOGLOBIN (HGB A1C): HEMOGLOBIN A1C: 7.4

## 2016-01-20 MED ORDER — DOXYCYCLINE HYCLATE 100 MG PO TABS: 100.0000 mg | ORAL_TABLET | Freq: Two times a day (BID) | ORAL | Status: DC

## 2016-01-20 MED ORDER — METFORMIN HCL ER 500 MG PO TB24: 1000.0000 mg | ORAL_TABLET | Freq: Every day | ORAL | Status: DC

## 2016-01-20 NOTE — Patient Instructions (Signed)
I am increasing your metformin to 2 tabs a day.

## 2016-01-20 NOTE — Progress Notes (Signed)
   Subjective:    Patient ID: [name] [name], male    DOB: [birth year], 75 y.o.   MRN: [id number]  HPI Hypertension- Pt denies chest pain, SOB, dizziness, or heart palpitations.  Taking meds as directed w/o problems.  Denies medication side effects.    Diabetes - no hypoglycemic events. No wounds or sores that are not healing well. No increased thirst or urination. Checking glucose at home. Taking medications as prescribed without any side effects.His not able to walk for exercise because of decreased endurance. He says his legs keep him from doing it. There were asked him specifically if he had any pain in his calves he said no. He does have a history of atherosclerosis so certainly he is at risk of peripheral vascular disease. It sounds like the last time he had his legs evaluated by [name] [name] was more than 5 years ago.  COPD - unfortunately he has a deductible and the Spiriva was over $100. He did go ahead and fill the prescription.  He has noticed some increased redness over his right lower extremity over the last 2-3 days. No fevers chills or sweats. He noticed it after working in the garden. He was in the rose bushes wearing shorts.    Review of Systems     Objective:   Physical Exam  Constitutional: He is oriented to person, place, and time. He appears well-developed and well-nourished.  HENT:  Head: Normocephalic and atraumatic.  Cardiovascular: Normal rate, regular rhythm and normal heart sounds.   Pulmonary/Chest: Effort normal and breath sounds normal.  Musculoskeletal: He exhibits edema.  1+ lower extremity edema bilateral laterally. He has some significant erythema over the right lower leg just above the ankle extending all the way to about 3 or 4 inches below the knee. Increased warmth to touch. He has had several small scabs on the lower part of his leg. No open wounds or drainage.  Neurological: He is alert and oriented to person, place, and time.  Skin: Skin is warm and dry.    Psychiatric: He has a normal mood and affect. His behavior is normal.            Assessment & Plan:  HTN - Uncontrolled. Hemoglobin A1c of 7.4 today. He says he is taking his medication regularly and feels that these doing a good job with his diet.  DM- Uncontrolled. Would like to get his A1c back down under 7. Will increase his metformin today for a total of 1000 mg extended release daily. Follow-up in 3 months.  Atherosclerosis-I think it's worth evaluating him for peripheral vascular disease. He is [name] [name] on Plavix and aspirin and an arm.  COPD - stable. He was able to get his Spiriva for one more month.  Cellulitis, early, right lower extremity - will treat with doxycycline. If not improving them please call us back over the next week. I like to recheck his leg in one week to make sure that it is improving.

## 2016-01-27 DIAGNOSIS — I70203 Unspecified atherosclerosis of native arteries of extremities, bilateral legs: Secondary | ICD-10-CM | POA: Diagnosis not present

## 2016-01-27 DIAGNOSIS — I739 Peripheral vascular disease, unspecified: Secondary | ICD-10-CM | POA: Diagnosis not present

## 2016-02-07 ENCOUNTER — Encounter: Payer: Self-pay | Admitting: Family Medicine

## 2016-02-07 ENCOUNTER — Ambulatory Visit (INDEPENDENT_AMBULATORY_CARE_PROVIDER_SITE_OTHER): Payer: Medicare Other | Admitting: Family Medicine

## 2016-02-07 VITALS — BP 132/68 | HR 52 | Wt 198.0 lb

## 2016-02-07 DIAGNOSIS — L03115 Cellulitis of right lower limb: Secondary | ICD-10-CM | POA: Diagnosis not present

## 2016-02-07 DIAGNOSIS — M7989 Other specified soft tissue disorders: Secondary | ICD-10-CM | POA: Diagnosis not present

## 2016-02-07 MED ORDER — CEPHALEXIN 500 MG PO CAPS: 500.0000 mg | ORAL_CAPSULE | Freq: Three times a day (TID) | ORAL | Status: DC

## 2016-02-07 NOTE — Progress Notes (Signed)
Subjective:    CC: F/U right leg cellulitis  HPI:  Follow-up right lower extremity cellulitis. I saw him about 2 weeks ago and he was put on doxycycline. He started noticing redness a couple days prior to coming in.He said it did start to look a little bit better during while on the doxycycline but after finishing it has started to look more red again. It tends to swell during the day and looks better first thing in the morning. We have referred him to vascular surgery for further workup for peripheral vascular disease which I highly suspect. That he is already on most of the treatment including Plavix and an ARB and aspirin. I just wanted to make sure that he didn't need some type of intervention such as a stent etc. He says he gets a little uncomfortable at times but it's not painful.   Past medical history, Surgical history, Family history not pertinant except as noted below, Social history, Allergies, and medications have been entered into the medical record, reviewed, and corrections made.   Review of Systems: No fevers, chills, night sweats, weight loss, chest pain, or shortness of breath.   Objective:    General: Well Developed, well nourished, and in no acute distress.  Neuro: Alert and oriented x3, extra-ocular muscles intact, sensation grossly intact.  HEENT: Normocephalic, atraumatic  Skin: Warm and dry, no rashes. Cardiac: Regular rate and rhythm, no murmurs rubs or gallops, no lower extremity edema.  Respiratory: Clear to auscultation bilaterally. Not using accessory muscles, speaking in full sentences.  He still has some mild erythema over the right lower leg that blanches very easily with some peau d'orange skin from just below the knee to above the ankle.  Impression and Recommendations:    Cellulitis- I still think he may have some component of cellulitis though it may just be related to peripheral vascular disease. Since he did get some improvement in the redness on the  doxycycline and it seemed to come back and we will try treating with Keflex. Call if any problems or side effects on the medication. And call if symptoms are not resolving. Next  Extremities swelling-will try to see the results of the testing that he had last week for possible peripheral vascular disease.

## 2016-02-19 ENCOUNTER — Telehealth: Payer: Self-pay | Admitting: Family Medicine

## 2016-02-19 NOTE — Telephone Encounter (Signed)
Please call patient when him know that the Dopplers of his lower legs did show some mild peripheral arterial disease but it was not severe requiring any type of procedure or stenting. The main treatment is controlling blood pressure and taking a cholesterol medication daily to prevent further blockage of the arteries. Toe pressures were normal as well so this is very reassuring.

## 2016-02-20 NOTE — Telephone Encounter (Signed)
lvm informing pt of results and recommendations. Advised to rtn call if any questions.[name] [name] [place]

## 2016-02-21 NOTE — Progress Notes (Addendum)
Patient ID: [name] [name], male   DOB: 11/15/1940, 75 y.o.   MRN: [id number]   [name] [name] is seen today in F/U for CAD, HTN and elevated chol. Has a friend from [facility] living with him now since his wife died. Still selling rope. I believe he's had a stent to the OM in 96 and subsequent intervention to LAD. 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 He is not having any SSCP, palpitatoins SOB or edema. He has been compliant with his meds. His LDL has been under 100 with normal LFT's He has moderate carotid disease   Reviewed duplex from  09/26/14  and stable  Left ICA 60-79% RICA 0-39%  Due for repeat July 2016  No change   Has chronic venous insuf with occasional skin breakdown at ankles  Given handicap placard last visit   Had lopressor changes and beta blocker decreased to atenolol 12.5  mg for his CAD, PVC;s and HTN  Not bradycardic now   Has right groin pain w/u primary negative no hernia   Increasing LE edema Bilateral Needs to be on diuretic   ROS: Denies fever, malais, weight loss, blurry vision, decreased visual acuity, cough, sputum, SOB, hemoptysis, pleuritic pain, palpitaitons, heartburn, abdominal pain, melena, lower extremity edema, claudication, or rash.  All other systems reviewed and negative  General: Affect appropriate Healthy:  appears stated age 75: normal Neck supple with no adenopathy JVP normal left  bruits no thyromegaly Lungs clear with no wheezing and good diaphragmatic motion Heart:  S1/S2 no murmur, no rub, gallop or click PMI normal Abdomen: benighn, BS positve, no tenderness, no AAA no bruit.  No HSM or HJR Distal pulses intact with no bruits Plus one to two  bilateral edema with venouls insuf and erythema medial malleolus  Neuro non-focal Skin warm and dry No muscular weakness   Current Outpatient Prescriptions  Medication Sig Dispense Refill  . amLODipine (NORVASC) 10 MG tablet Take 1 tablet (10 mg total) by mouth daily. 90 tablet 3   . aspirin 325 MG tablet Take 325 mg by mouth daily.      Marland Kitchen atenolol (TENORMIN) 25 MG tablet Take 1/2 tab daily  0  . atorvastatin (LIPITOR) 40 MG tablet Take 1 tablet (40 mg total) by mouth daily. 90 tablet 3  . clopidogrel (PLAVIX) 75 MG tablet take 1 tablet by mouth daily 90 tablet 3  . doxazosin (CARDURA) 4 MG tablet take 1 tablet by mouth at bedtime 90 tablet 1  . dutasteride (AVODART) 0.5 MG capsule Take 0.5 mg by mouth daily. Reported on 11/08/2015    . losartan (COZAAR) 100 MG tablet take 1 tablet by mouth once daily 90 tablet 2  . metFORMIN (GLUCOPHAGE-XR) 500 MG 24 hr tablet Take 2 tablets (1,000 mg total) by mouth daily with breakfast. 180 tablet 1  . metoprolol succinate (TOPROL-XL) 100 MG 24 hr tablet Take 1 tablet (100 mg total) by mouth daily. 90 tablet 1  . PROAIR HFA 108 (90 Base) MCG/ACT inhaler INAHLE 2 PUFFS INTO THE LUNGS EVERY 6 HOURS AS NEEDED FOR WHEEZING 8.5 Inhaler 2  . SPIRIVA HANDIHALER 18 MCG inhalation capsule inhale the contents of one capsule in the handihaler once daily 30 capsule 11  . tamsulosin (FLOMAX) 0.4 MG CAPS capsule Take 1 capsule (0.4 mg total) by mouth daily. 90 capsule 3  . traMADol (ULTRAM) 50 MG tablet take 1 tablet by mouth twice a day 30 tablet 0   No current facility-administered  medications for this visit.    Allergies  Review of patient's allergies indicates no known allergies.  Electrocardiogram:  08/05/12  SR rate 51 normal ECG    10/19/14  SR rate 45 mormal 02/24/16  SR rate 96 LVH no acute changes  02/24/16 SR rate 54 RBBB LAFB minimal voltage for LVH Assessment and Plan CAD: distant stent OM 96 continue medical Rx HTN:  Well controlled.  Continue current medications and low sodium Dash type diet.   Bradycardia:  Resolved on lower dose beta blocker Chol:   Cholesterol is at goal.  Continue current dose of statin and diet Rx.  No myalgias or side effects.  F/U  LFT's in 6 months. Lab Results  Component Value Date   LDLCALC 69  09/09/2015  Bruit:  Left ICA 60-79% stenosis.  No TIA symptoms.  Continue antiplatelet Rx and F/U carotid duplex due July 2017  Edema:  Dependant from chronic venous disease start HCTZ 25 mg f/u BMET 3 weeks    F/U with me next available   [employer]

## 2016-02-24 ENCOUNTER — Encounter: Payer: Self-pay | Admitting: Cardiovascular Disease

## 2016-02-24 ENCOUNTER — Ambulatory Visit (INDEPENDENT_AMBULATORY_CARE_PROVIDER_SITE_OTHER): Payer: Medicare Other | Admitting: Cardiovascular Disease

## 2016-02-24 VITALS — BP 150/60 | HR 53 | Ht 70.0 in | Wt 197.1 lb

## 2016-02-24 DIAGNOSIS — I10 Essential (primary) hypertension: Secondary | ICD-10-CM

## 2016-02-24 DIAGNOSIS — R0989 Other specified symptoms and signs involving the circulatory and respiratory systems: Secondary | ICD-10-CM | POA: Diagnosis not present

## 2016-02-24 DIAGNOSIS — Z79899 Other long term (current) drug therapy: Secondary | ICD-10-CM | POA: Diagnosis not present

## 2016-02-24 MED ORDER — HYDROCHLOROTHIAZIDE 25 MG PO TABS: 25.0000 mg | ORAL_TABLET | Freq: Every day | ORAL | Status: DC

## 2016-02-24 NOTE — Addendum Note (Signed)
Addended by: Aris Georgia, Thaila Bottoms L on: 02/24/2016 11:47 AM   Modules accepted: Orders

## 2016-02-24 NOTE — Patient Instructions (Addendum)
Medication Instructions:  Your physician has recommended you make the following change in your medication:  1-START hydrochlorothiazide 25 mg by mouth daily  Labwork: Your physician recommends that you return for lab work in: 3 weeks- BMET   Testing/Procedures: Your physician has requested that you have a carotid duplex (August). This test is an ultrasound of the carotid arteries in your neck. It looks at blood flow through these arteries that supply the brain with blood. Allow one hour for this exam. There are no restrictions or special instructions.    Follow-Up: Your physician wants you to follow-up in: next available with Dr. Johnsie Cancel.   If you need a refill on your cardiac medications before your next appointment, please call your pharmacy.

## 2016-02-27 ENCOUNTER — Telehealth: Payer: Self-pay

## 2016-02-27 NOTE — Telephone Encounter (Signed)
Left message for patient to have a follow-up appointment in Sept.

## 2016-03-16 ENCOUNTER — Other Ambulatory Visit (INDEPENDENT_AMBULATORY_CARE_PROVIDER_SITE_OTHER): Payer: Medicare Other | Admitting: *Deleted

## 2016-03-16 ENCOUNTER — Telehealth: Payer: Self-pay

## 2016-03-16 DIAGNOSIS — Z79899 Other long term (current) drug therapy: Secondary | ICD-10-CM

## 2016-03-16 LAB — BASIC METABOLIC PANEL WITH GFR
BUN: 34 mg/dL — ABNORMAL HIGH (ref 7–25)
CO2: 25 mmol/L (ref 20–31)
Calcium: 8.5 mg/dL — ABNORMAL LOW (ref 8.6–10.3)
Chloride: 103 mmol/L (ref 98–110)
Creat: 1.24 mg/dL — ABNORMAL HIGH (ref 0.70–1.18)
Glucose, Bld: 251 mg/dL — ABNORMAL HIGH (ref 65–99)
Potassium: 3.7 mmol/L (ref 3.5–5.3)
Sodium: 138 mmol/L (ref 135–146)

## 2016-03-16 MED ORDER — HYDROCHLOROTHIAZIDE 25 MG PO TABS: 25.0000 mg | ORAL_TABLET | ORAL | Status: DC

## 2016-03-16 NOTE — Telephone Encounter (Signed)
Called patient about lab results. Per Dr. Johnsie Cancel, stop HCTZ for 3 days than take qod f/u labs in 4 weeks. Patient verbalized understanding and will come in on 04/13/16 for repeat lab work.

## 2016-03-16 NOTE — Telephone Encounter (Signed)
-----   Message from Josue Hector, MD sent at 03/16/2016  5:24 PM EDT ----- Stop hydrodiuril for 3 days then take qod  F/u labs in 4 weeks

## 2016-03-16 NOTE — Addendum Note (Signed)
Addended by: Eulis Foster on: 03/16/2016 09:16 AM   Modules accepted: Orders

## 2016-03-19 NOTE — Telephone Encounter (Signed)
Left second message for patient to call back to schedule appointment for September.

## 2016-04-09 NOTE — Telephone Encounter (Signed)
Patient has appointment in August.

## 2016-04-13 ENCOUNTER — Other Ambulatory Visit: Payer: Medicare Other | Admitting: *Deleted

## 2016-04-13 DIAGNOSIS — Z79899 Other long term (current) drug therapy: Secondary | ICD-10-CM | POA: Diagnosis not present

## 2016-04-13 LAB — BASIC METABOLIC PANEL
BUN: 25 mg/dL (ref 7–25)
CALCIUM: 9 mg/dL (ref 8.6–10.3)
CHLORIDE: 101 mmol/L (ref 98–110)
CO2: 28 mmol/L (ref 20–31)
CREATININE: 1.13 mg/dL (ref 0.70–1.18)
Glucose, Bld: 303 mg/dL — ABNORMAL HIGH (ref 65–99)
Potassium: 3.7 mmol/L (ref 3.5–5.3)
SODIUM: 138 mmol/L (ref 135–146)

## 2016-04-14 ENCOUNTER — Telehealth: Payer: Self-pay | Admitting: Family Medicine

## 2016-04-14 MED ORDER — GLIPIZIDE ER 2.5 MG PO TB24: 2.5000 mg | ORAL_TABLET | Freq: Every day | ORAL | 3 refills | Status: DC

## 2016-04-14 NOTE — Telephone Encounter (Signed)
Please call patient: I received his recent labs from cardiology and his blood sugar has been high the last couple times. In fact way too high. We may need to look at adding something to his current regimen and addition to his metformin to get this under better control. I'm to send over a low-dose of glipizide and I want him to go ahead and start taking it. Keep his follow-up appointment for his A1C.

## 2016-04-15 NOTE — Telephone Encounter (Signed)
LVM INFORMING PT OF RECOMMENDATIONS.[name] [name]

## 2016-04-15 NOTE — Telephone Encounter (Signed)
NO VM SET UP.[name] [name]

## 2016-04-17 ENCOUNTER — Telehealth: Payer: Self-pay | Admitting: Family Medicine

## 2016-04-17 ENCOUNTER — Ambulatory Visit (INDEPENDENT_AMBULATORY_CARE_PROVIDER_SITE_OTHER): Payer: Medicare Other | Admitting: Family Medicine

## 2016-04-17 ENCOUNTER — Encounter: Payer: Self-pay | Admitting: Family Medicine

## 2016-04-17 VITALS — BP 168/69 | HR 54 | Wt 202.0 lb

## 2016-04-17 DIAGNOSIS — J449 Chronic obstructive pulmonary disease, unspecified: Secondary | ICD-10-CM | POA: Diagnosis not present

## 2016-04-17 DIAGNOSIS — R809 Proteinuria, unspecified: Secondary | ICD-10-CM | POA: Diagnosis not present

## 2016-04-17 DIAGNOSIS — I2510 Atherosclerotic heart disease of native coronary artery without angina pectoris: Secondary | ICD-10-CM

## 2016-04-17 DIAGNOSIS — E119 Type 2 diabetes mellitus without complications: Secondary | ICD-10-CM | POA: Diagnosis not present

## 2016-04-17 DIAGNOSIS — L723 Sebaceous cyst: Secondary | ICD-10-CM

## 2016-04-17 DIAGNOSIS — N1832 Chronic kidney disease, stage 3b: Secondary | ICD-10-CM

## 2016-04-17 DIAGNOSIS — I10 Essential (primary) hypertension: Secondary | ICD-10-CM

## 2016-04-17 DIAGNOSIS — N183 Chronic kidney disease, stage 3 (moderate): Secondary | ICD-10-CM

## 2016-04-17 LAB — POCT GLYCOSYLATED HEMOGLOBIN (HGB A1C): HEMOGLOBIN A1C: 8.3

## 2016-04-17 MED ORDER — UMECLIDINIUM-VILANTEROL 62.5-25 MCG/INH IN AEPB: 1.0000 | INHALATION_SPRAY | Freq: Every day | RESPIRATORY_TRACT | 5 refills | Status: DC

## 2016-04-17 NOTE — Telephone Encounter (Signed)
Call pt: BP was high at OV.  See if can return in 2 weeks for BP check.    Beatrice Lecher, MD

## 2016-04-17 NOTE — Patient Instructions (Addendum)
Stop the atenolol if you are taking it.   The pill you are taking every other day is the fluid pill. Your kidneys were too dry so Dr. Johnsie Cancel decreased it to every other day.  Try the Anoro - 1 puff inhaled once a day.  Use your coupon for the first month for free.   Keep taking glipizide. Check his sugar a couple of times a week.

## 2016-04-17 NOTE — Progress Notes (Signed)
Subjective:    CC: DM  HPI:  Diabetes - no hypoglycemic events. No wounds or sores that are not healing well. No increased thirst or urination. Checking glucose at home. Taking medications as prescribed without any side effects.  Currently on metformin, glipizide. Patient had an elevated blood sugar with his cardiologist. This note was 40 to me and I called in a perception for low-dose glipizide 2.5 mg extended release. He was able to pick it up and says he started it yesterday.  Hypertension- Pt denies chest pain, SOB, dizziness, or heart palpitations.  Taking meds as directed w/o problems.  Denies medication side effects.  Currently on metoprolol, losartan, HCTZ every other day, Cardura.     CAD- No recent chest pain or shortness of breath. He also has peripheral vascular disease. On Plavix and aspirin.  COPD - has been a little more SOB this summer with the heat.  On spiriva.  No cough or sputum or fever.     Past medical history, Surgical history, Family history not pertinant except as noted below, Social history, Allergies, and medications have been entered into the medical record, reviewed, and corrections made.   Review of Systems: No fevers, chills, night sweats, weight loss, chest pain, or shortness of breath.   Objective:    General: Well Developed, well nourished, and in no acute distress.  Neuro: Alert and oriented x3, extra-ocular muscles intact, sensation grossly intact.  HEENT: Normocephalic, atraumatic  Skin: Warm and dry, no rashes. Cyst on back of scalp Cardiac: Regular rate and rhythm, no murmurs rubs or gallops, no lower extremity edema.  Respiratory: Clear to auscultation bilaterally. Not using accessory muscles, speaking in full sentences.   Impression and Recommendations:   DM with microalbuminuria - Uncontrolled. Hemoglobin A1c of 8.6 today. Just started glipizide yesterday. Will follow up in 3 months and see taking his blood sugars under control. Encouraged him  to get back on track with diet and exercise as well.  HTN - BP high today not at goal. Recheck in 2 weeks. D/C atenolol as already on metoprolol.   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   COPD - Change to [place]. Coupon card given.    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

## 2016-04-20 ENCOUNTER — Other Ambulatory Visit: Payer: Self-pay

## 2016-04-20 MED ORDER — BAYER CONTOUR MONITOR DEVI: 0 refills | Status: DC

## 2016-04-20 MED ORDER — GLUCOSE BLOOD VI STRP: ORAL_STRIP | 99 refills | Status: AC

## 2016-04-20 NOTE — Telephone Encounter (Signed)
Left VM for Pt advising of recommendation, callback provided for scheduling.

## 2016-04-22 ENCOUNTER — Encounter: Payer: Self-pay | Admitting: Cardiovascular Disease

## 2016-05-07 NOTE — Progress Notes (Deleted)
Patient ID: [name] [name], male   DOB: [birth year], 75 y.o.   MRN: [id number]   [name] [name] is seen today in F/U for CAD, HTN and elevated chol. [facility] him now since his wife died. Still selling rope. I believe he's had a stent to the OM in 96 and subsequent intervention to LAD. 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 He is not having any SSCP, palpitatoins SOB or edema. He has been compliant with his meds. His LDL has been under 100 with normal LFT's He has moderate carotid disease   Reviewed duplex from  09/26/14  and stable  Left ICA 60-79% RICA 0-39%  Due for repeat July 2016  No change   Has chronic venous insuf with occasional skin breakdown at ankles  Given handicap placard last visit   Had lopressor changes and beta blocker decreased to atenolol 12.5  mg for his CAD, PVC;s and HTN  Not bradycardic now   Has right groin pain w/u primary negative no hernia   Increasing LE edema Bilateral Needs to be on diuretic but had to cut back to qod due to azotemia Lab Results  Component Value Date   CREATININE 1.13 04/13/2016   BUN 25 04/13/2016   NA 138 04/13/2016   K 3.7 04/13/2016   CL 101 04/13/2016   CO2 28 04/13/2016     ROS: Denies fever, malais, weight loss, blurry vision, decreased visual acuity, cough, sputum, SOB, hemoptysis, pleuritic pain, palpitaitons, heartburn, abdominal pain, melena, lower extremity edema, claudication, or rash.  All other systems reviewed and negative  General: Affect appropriate Healthy:  appears stated age HEENT: normal Neck supple with no adenopathy JVP normal left  bruits no thyromegaly Lungs clear with no wheezing and good diaphragmatic motion Heart:  S1/S2 no murmur, no rub, gallop or click PMI normal Abdomen: benighn, BS positve, no tenderness, no AAA no bruit.  No HSM or HJR Distal pulses intact with no bruits Plus one to two  bilateral edema with venouls insuf and erythema medial malleolus  Neuro  non-focal Skin warm and dry No muscular weakness   Current Outpatient Prescriptions  Medication Sig Dispense Refill  . amLODipine (NORVASC) 10 MG tablet Take 1 tablet (10 mg total) by mouth daily. 90 tablet 3  . aspirin 325 MG tablet Take 325 mg by mouth daily.      Marland Kitchen. atorvastatin (LIPITOR) 40 MG tablet Take 1 tablet (40 mg total) by mouth daily. 90 tablet 3  . Blood Glucose Monitoring Suppl (CONTOUR BLOOD GLUCOSE SYSTEM) DEVI Diagnoses - Diabetes E 11.8 Check blood sugar once daily 1 Device 0  . clopidogrel (PLAVIX) 75 MG tablet take 1 tablet by mouth daily 90 tablet 3  . doxazosin (CARDURA) 4 MG tablet take 1 tablet by mouth at bedtime 90 tablet 1  . dutasteride (AVODART) 0.5 MG capsule Take 0.5 mg by mouth daily. Reported on 11/08/2015    . glipiZIDE (GLUCOTROL XL) 2.5 MG 24 hr tablet Take 1 tablet (2.5 mg total) by mouth daily with breakfast. 30 tablet 3  . glucose blood (BAYER CONTOUR TEST) test strip Diagnoses - Diabetes E 11.8 Check blood sugar once daily 100 each prn  . hydrochlorothiazide (HYDRODIURIL) 25 MG tablet Take 1 tablet (25 mg total) by mouth every other day. 45 tablet 3  . losartan (COZAAR) 100 MG tablet take 1 tablet by mouth once daily 90 tablet 2  . metFORMIN (GLUCOPHAGE-XR) 500 MG 24 hr tablet  Take 2 tablets (1,000 mg total) by mouth daily with breakfast. 180 tablet 1  . metoprolol succinate (TOPROL-XL) 100 MG 24 hr tablet Take 1 tablet (100 mg total) by mouth daily. 90 tablet 1  . PROAIR HFA 108 (90 Base) MCG/ACT inhaler INAHLE 2 PUFFS INTO THE LUNGS EVERY 6 HOURS AS NEEDED FOR WHEEZING 8.5 Inhaler 2  . tamsulosin (FLOMAX) 0.4 MG CAPS capsule Take 1 capsule (0.4 mg total) by mouth daily. 90 capsule 3  . traMADol (ULTRAM) 50 MG tablet take 1 tablet by mouth twice a day 30 tablet 0  . umeclidinium-vilanterol (ANORO ELLIPTA) 62.5-25 MCG/INH AEPB Inhale 1 puff into the lungs daily. 1 each 5   No current facility-administered medications for this visit.      Allergies  Review of patient's allergies indicates no known allergies.  Electrocardiogram:  08/05/12  SR rate 51 normal ECG    10/19/14  SR rate 45 mormal 02/24/16  SR rate 96 LVH no acute changes  02/24/16 SR rate 54 RBBB LAFB minimal voltage for LVH  Assessment and Plan CAD: distant stent OM 96 continue medical Rx HTN:  Well controlled.  Continue current medications and low sodium Dash type diet.   Bradycardia:  Resolved on lower dose beta blocker Chol:   Cholesterol is at goal.  Continue current dose of statin and diet Rx.  No myalgias or side effects.  F/U  LFT's in 6 months. Lab Results  Component Value Date   LDLCALC 69 09/09/2015  Bruit:  Left ICA 60-79% stenosis.  No TIA symptoms.  Continue antiplatelet Rx and F/U carotid duplex due July 2017  Edema:  Dependant from chronic venous disease HCTZ qod    F/U with me next available   [employer]

## 2016-05-08 ENCOUNTER — Ambulatory Visit: Payer: Medicare Other | Admitting: Cardiovascular Disease

## 2016-05-11 ENCOUNTER — Encounter: Payer: Self-pay | Admitting: Family Medicine

## 2016-05-11 ENCOUNTER — Ambulatory Visit (INDEPENDENT_AMBULATORY_CARE_PROVIDER_SITE_OTHER): Payer: Medicare Other | Admitting: Family Medicine

## 2016-05-11 VITALS — BP 132/66 | HR 66 | Resp 17 | Ht 70.0 in | Wt 197.0 lb

## 2016-05-11 DIAGNOSIS — Z23 Encounter for immunization: Secondary | ICD-10-CM | POA: Diagnosis not present

## 2016-05-11 DIAGNOSIS — I10 Essential (primary) hypertension: Secondary | ICD-10-CM | POA: Diagnosis not present

## 2016-05-11 DIAGNOSIS — J449 Chronic obstructive pulmonary disease, unspecified: Secondary | ICD-10-CM

## 2016-05-11 NOTE — Progress Notes (Signed)
Subjective:    CC: BP  HPI:  75 year old male here today to follow-up for blood pressure. When I last reviewed his medication list he actually had to beta blockers on his list so I encouraged him to stop the atenolol and then follow-up for blood pressure check. He is down 5 lbs.    COPD - he says sthe Anoro is working well.  He went from using his albuterol daily to just once in the last 3 weeks. He has noticed a significant difference. He says some days he does great but other days he still is noticing some shortness of breath particularly with exercise and activity. He does not exercise regularly.  Past medical history, Surgical history, Family history not pertinant except as noted below, Social history, Allergies, and medications have been entered into the medical record, reviewed, and corrections made.   Review of Systems: No fevers, chills, night sweats, weight loss, chest pain, or shortness of breath.   Objective:    General: Well Developed, well nourished, and in no acute distress.  Neuro: Alert and oriented x3, extra-ocular muscles intact, sensation grossly intact.  HEENT: Normocephalic, atraumatic  Skin: Warm and dry, no rashes. Cardiac: Regular rate and rhythm, no murmurs rubs or gallops, no lower extremity edema.  Respiratory: Clear to auscultation bilaterally. Not using accessory muscles, speaking in full sentences.   Impression and Recommendations:   HTN -  Well controlled off the antenolol. Continue current regimen. Graduated him on weight loss. Encouraged him to keep it up. Follow up in  6 month.   COPD-  Well controlled. Continue current regimen. Strongly encouraged him to start an exercise routine to help with his endurance. Follow up in  6 months.    He is also been diagnosed with a rectal fissure through his urologist. He is interested in a referral to general surgery sometime this fall. He also has a cyst on his neck that needs to be removed as interested in referral  for that as well again later in the fall.   Flu vaccine given.

## 2016-05-15 ENCOUNTER — Ambulatory Visit (HOSPITAL_COMMUNITY)
Admission: RE | Admit: 2016-05-15 | Discharge: 2016-05-15 | Disposition: A | Discharge status: Home / Self Care | Payer: Medicare Other | Source: Ambulatory Visit | Attending: Cardiology | Admitting: Cardiology

## 2016-05-15 DIAGNOSIS — I2510 Atherosclerotic heart disease of native coronary artery without angina pectoris: Secondary | ICD-10-CM | POA: Insufficient documentation

## 2016-05-15 DIAGNOSIS — J449 Chronic obstructive pulmonary disease, unspecified: Secondary | ICD-10-CM | POA: Diagnosis not present

## 2016-05-15 DIAGNOSIS — I6523 Occlusion and stenosis of bilateral carotid arteries: Secondary | ICD-10-CM | POA: Insufficient documentation

## 2016-05-15 DIAGNOSIS — R0989 Other specified symptoms and signs involving the circulatory and respiratory systems: Secondary | ICD-10-CM | POA: Diagnosis not present

## 2016-05-15 DIAGNOSIS — I10 Essential (primary) hypertension: Secondary | ICD-10-CM | POA: Diagnosis not present

## 2016-05-15 DIAGNOSIS — Z87891 Personal history of nicotine dependence: Secondary | ICD-10-CM | POA: Insufficient documentation

## 2016-05-15 DIAGNOSIS — E119 Type 2 diabetes mellitus without complications: Secondary | ICD-10-CM | POA: Diagnosis not present

## 2016-05-15 DIAGNOSIS — E785 Hyperlipidemia, unspecified: Secondary | ICD-10-CM | POA: Insufficient documentation

## 2016-06-08 DIAGNOSIS — C61 Malignant neoplasm of prostate: Secondary | ICD-10-CM | POA: Diagnosis not present

## 2016-06-11 ENCOUNTER — Other Ambulatory Visit: Payer: Self-pay | Admitting: Family Medicine

## 2016-06-15 DIAGNOSIS — C61 Malignant neoplasm of prostate: Secondary | ICD-10-CM | POA: Diagnosis not present

## 2016-07-12 ENCOUNTER — Other Ambulatory Visit: Payer: Self-pay | Admitting: Family Medicine

## 2016-07-30 ENCOUNTER — Encounter: Payer: Self-pay | Admitting: Family Medicine

## 2016-07-30 ENCOUNTER — Ambulatory Visit (INDEPENDENT_AMBULATORY_CARE_PROVIDER_SITE_OTHER): Payer: Medicare Other | Admitting: Family Medicine

## 2016-07-30 VITALS — BP 136/64 | HR 59 | Ht 70.0 in | Wt 200.0 lb

## 2016-07-30 DIAGNOSIS — K529 Noninfective gastroenteritis and colitis, unspecified: Secondary | ICD-10-CM

## 2016-07-30 DIAGNOSIS — I10 Essential (primary) hypertension: Secondary | ICD-10-CM

## 2016-07-30 DIAGNOSIS — E119 Type 2 diabetes mellitus without complications: Secondary | ICD-10-CM | POA: Diagnosis not present

## 2016-07-30 LAB — POCT GLYCOSYLATED HEMOGLOBIN (HGB A1C): Hemoglobin A1C: 6.4

## 2016-07-30 NOTE — Progress Notes (Signed)
Subjective:    CC: "digestive issues"   HPI:  75 year old male with a history of diabetes, coronary artery disease, and hypertension comes in today complaining of diarrhea x 3 week with starting myrbetriq.   He denies any significant abdominal pain or cramping. He does get a lot of extra noises and rumbling. He's been much more bloated. He denies any significant dietary changes. No blood in the stools. He describes the mostly is loose. They're not usually watery. He is having 2-3 bowel movements per day. It's not waking him up at night.   Hypertension- Pt denies chest pain, SOB, dizziness, or heart palpitations.  Taking meds as directed w/o problems.  Denies medication side effects.    Diabetes - no hypoglycemic events. No wounds or sores that are not healing well. No increased thirst or urination. Checking glucose at home. Taking medications as prescribed without any side effects.   Past medical history, Surgical history, Family history not pertinant except as noted below, Social history, Allergies, and medications have been entered into the medical record, reviewed, and corrections made.   Review of Systems: No fevers, chills, night sweats, weight loss, chest pain, or shortness of breath.   Objective:    General: Well Developed, well nourished, and in no acute distress.  Neuro: Alert and oriented x3, extra-ocular muscles intact, sensation grossly intact.  HEENT: Normocephalic, atraumatic  Skin: Warm and dry, no rashes. Cardiac: Regular rate and rhythm, no murmurs rubs or gallops, no lower extremity edema.  Respiratory: Clear to auscultation bilaterally. Not using accessory muscles, speaking in full sentences. Abd: Soft, nontender. Increased tympany on exam. No rebound or guarding.   Impression and Recommendations:    HTN -  Well controlled. Continue current regimen. Follow up in  4 months.   Diarrhea x 3 weeks - could be from the Myrbetriq. Will have him stop the medication for a  few days and see if it improves. Will also get stool cultures and C. difficile. I did not get a CBC since he's not having a fever. Is not having a lot of abdominal pain or tenderness on exam which is very reassuring. He denies any significant dietary changes.  DM- Well controlled. Continue current regimen. Follow up in  3-4 months.  Lab Results  Component Value Date   HGBA1C 6.4 07/30/2016

## 2016-07-30 NOTE — Patient Instructions (Signed)
If you  stop the Myrbetriq for a few days and the diarrhea does not improve then please give [place] a call back.

## 2016-08-03 ENCOUNTER — Other Ambulatory Visit: Payer: Self-pay | Admitting: Family Medicine

## 2016-08-24 ENCOUNTER — Other Ambulatory Visit: Payer: Self-pay | Admitting: Family Medicine

## 2016-08-24 DIAGNOSIS — K529 Noninfective gastroenteritis and colitis, unspecified: Secondary | ICD-10-CM | POA: Diagnosis not present

## 2016-08-25 LAB — C. DIFFICILE GDH AND TOXIN A/B
C. difficile GDH: NOT DETECTED
C. difficile Toxin A/B: NOT DETECTED

## 2016-08-28 LAB — STOOL CULTURE

## 2016-08-28 NOTE — Addendum Note (Signed)
Addended by: [name] on: 08/28/2016 03:43 PM   Modules accepted: Orders

## 2016-09-02 DIAGNOSIS — R195 Other fecal abnormalities: Secondary | ICD-10-CM | POA: Diagnosis not present

## 2016-09-02 DIAGNOSIS — R194 Change in bowel habit: Secondary | ICD-10-CM | POA: Diagnosis not present

## 2016-09-10 ENCOUNTER — Other Ambulatory Visit: Payer: Self-pay | Admitting: Family Medicine

## 2016-09-10 ENCOUNTER — Other Ambulatory Visit: Payer: Self-pay | Admitting: Cardiovascular Disease

## 2016-09-11 ENCOUNTER — Other Ambulatory Visit: Payer: Self-pay | Admitting: Cardiovascular Disease

## 2016-09-11 ENCOUNTER — Other Ambulatory Visit: Payer: Self-pay | Admitting: Family Medicine

## 2016-10-12 ENCOUNTER — Other Ambulatory Visit: Payer: Self-pay | Admitting: Family Medicine

## 2016-10-15 ENCOUNTER — Ambulatory Visit (INDEPENDENT_AMBULATORY_CARE_PROVIDER_SITE_OTHER): Payer: Medicare Other | Admitting: Family Medicine

## 2016-10-15 ENCOUNTER — Encounter: Payer: Self-pay | Admitting: Family Medicine

## 2016-10-15 VITALS — BP 174/50 | HR 65 | Ht 70.0 in | Wt 200.0 lb

## 2016-10-15 DIAGNOSIS — R600 Localized edema: Secondary | ICD-10-CM

## 2016-10-15 NOTE — Patient Instructions (Signed)
Try increasing her thiazide to once a day instead of every other day and then let's recheck your kidney function in about 1-2 weeks to make sure that it's looking okay.

## 2016-10-15 NOTE — Progress Notes (Signed)
Subjective:    Patient ID: [name] [name], male    DOB: April 05, 1941, 76 y.o.   MRN: [id number]  HPI Still having bilateral leg swelling. Worse during the day.  Better in the morning.  No pain or SOB. Has been wearing his compression stocking.  Getting some burning sensation at time. The left is definitely worse than the right. He has not been wearing his compression stockings recently but he does have them at home. He said he bought a pair with zippers that he can actually get on. He's not had any chest pain or shortness of breath. He does get his carotids scanned every 6 months.    Chronic diarrhea-he did go see GI. They recommended that he decrease his metformin and start taking a fiber supplement. He said he did it for 30 days and did not notice any improvement in his symptoms at all. This is his busy time of year work and is concerned because the diarrhea is interfering. He does have a follow-up next month with GI and does plan on going back. Colonoscopy is up-to-date.  Review of Systems   BP (!) 174/50   Pulse 65   Ht 5\' 10"  (1.778 m)   Wt 200 lb (90.7 kg)   SpO2 100%   BMI 28.70 kg/m     No Known Allergies  Past Medical History:  Diagnosis Date  . Allergic rhinitis   . Bigeminy   . Bradycardia   . Bruit   . CAD (coronary artery disease)   . Claudication ([place])   . Diabetes mellitus type II   . Dyspnea   . History of prostate cancer    Dr. Gaynelle Arabian  . Hypercholesteremia    II A  . Hypertension   . Malignant neoplasm of prostate ([place])   . Microalbuminuria   . Seborrheic keratosis     Past Surgical History:  Procedure Laterality Date  . CORONARY ANGIOPLASTY WITH STENT PLACEMENT      Social History   Social History  . Marital status: Married    Spouse name: N/A  . Number of children: N/A  . Years of education: N/A   Occupational History  . 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 Smoking status: Former Smoker   Packs/day: 1.00    Years: 20.00    Quit date: 04/22/2007  . Smokeless tobacco: Never Used  . Alcohol use 0.5 oz/week    1 Standard drinks or equivalent per week  . Drug use: Unknown  . Sexual activity: Not on file   Other Topics Concern  . Not on file   Social History Narrative   Widowed 08/2009   Has 4 kids, 2 are local   Fair diet   No regular exercise    Family History  Problem Relation Age of Onset  . Heart attack Father   . Coronary artery disease Brother     Outpatient Encounter Prescriptions as of 10/15/2016  Medication Sig  . amLODipine (NORVASC) 10 MG tablet Take 1 tablet (10 mg total) by mouth daily.  Marland Kitchen aspirin 325 MG tablet Take 325 mg by mouth daily.    Marland Kitchen atorvastatin (LIPITOR) 40 MG tablet take 1 tablet by mouth once daily  . Blood Glucose Monitoring Suppl (CONTOUR BLOOD GLUCOSE SYSTEM) DEVI Diagnoses - Diabetes E 11.8 Check blood sugar once daily  . clopidogrel (PLAVIX) 75 MG tablet take 1 tablet by mouth once daily  . doxazosin (CARDURA) 4 MG  tablet take 1 tablet by mouth at bedtime  . dutasteride (AVODART) 0.5 MG capsule Take 0.5 mg by mouth daily. Reported on 11/08/2015  . glipiZIDE (GLUCOTROL XL) 2.5 MG 24 hr tablet take 1 tablet by mouth once daily WITH BREAKFAST  . glucose blood (BAYER CONTOUR TEST) test strip Diagnoses - Diabetes E 11.8 Check blood sugar once daily  . hydrochlorothiazide (HYDRODIURIL) 25 MG tablet Take 1 tablet (25 mg total) by mouth every other day.  . losartan (COZAAR) 100 MG tablet take 1 tablet by mouth once daily  . metFORMIN (GLUCOPHAGE-XR) 500 MG 24 hr tablet take 2 tablets by mouth once daily with BREAKFAST  . metoprolol succinate (TOPROL-XL) 100 MG 24 hr tablet take 1 tablet by mouth once daily  . MYRBETRIQ 50 MG TB24 tablet Take 50 mg by mouth daily.  Marland Kitchen PROAIR HFA 108 (90 Base) MCG/ACT inhaler INAHLE 2 PUFFS INTO THE LUNGS EVERY 6 HOURS AS NEEDED FOR WHEEZING  . tamsulosin (FLOMAX) 0.4 MG CAPS capsule Take 1 capsule (0.4 mg total)  by mouth daily.  . traMADol (ULTRAM) 50 MG tablet take 1 tablet by mouth twice a day  . umeclidinium-vilanterol (ANORO ELLIPTA) 62.5-25 MCG/INH AEPB Inhale 1 puff into the lungs daily.   No facility-administered encounter medications on file as of 10/15/2016.           Objective:   Physical Exam  Constitutional: He is oriented to person, place, and time. He appears well-developed and well-nourished.  HENT:  Head: Normocephalic and atraumatic.  Cardiovascular: Normal rate, regular rhythm and normal heart sounds.   Pulmonary/Chest: Effort normal and breath sounds normal.  Musculoskeletal:  Trace edema around the right ankle. He has significant edema in the left leg all the way up to just below the knee. 1+ pitting with some mild erythema diffusely and loss of hair on both lower legs. Skin appears more shiny.  Neurological: He is alert and oriented to person, place, and time.  Skin: Skin is warm and dry.  Psychiatric: He has a normal mood and affect. His behavior is normal.        Assessment & Plan:  Lower extremity swelling-I think we should go ahead and evaluate him for congestive heart failure. Will schedule echocardiogram. Do not see one on his chart in quite some time. We'll also schedule him for ABIs to look to see if he may have some evidence of additional peripheral vascular disease besides in his carotids. Recommend that he wear his compression stockings regularly.  Chronic diarrhea-keep follow-up appointment with GI. He is now back up to his 2 tabs on the metformin.

## 2016-10-28 ENCOUNTER — Other Ambulatory Visit: Payer: Self-pay | Admitting: Family Medicine

## 2016-10-28 DIAGNOSIS — I739 Peripheral vascular disease, unspecified: Secondary | ICD-10-CM

## 2016-11-03 DIAGNOSIS — R195 Other fecal abnormalities: Secondary | ICD-10-CM | POA: Diagnosis not present

## 2016-11-03 DIAGNOSIS — R194 Change in bowel habit: Secondary | ICD-10-CM | POA: Diagnosis not present

## 2016-11-11 ENCOUNTER — Other Ambulatory Visit: Payer: Self-pay | Admitting: Family Medicine

## 2016-11-16 ENCOUNTER — Ambulatory Visit (HOSPITAL_COMMUNITY)
Admission: RE | Admit: 2016-11-16 | Discharge: 2016-11-16 | Disposition: A | Discharge status: Home / Self Care | Payer: Medicare Other | Source: Ambulatory Visit | Attending: Cardiology | Admitting: Cardiology

## 2016-11-16 DIAGNOSIS — I739 Peripheral vascular disease, unspecified: Secondary | ICD-10-CM

## 2016-11-16 DIAGNOSIS — I70203 Unspecified atherosclerosis of native arteries of extremities, bilateral legs: Secondary | ICD-10-CM | POA: Insufficient documentation

## 2016-11-16 DIAGNOSIS — I700 Atherosclerosis of aorta: Secondary | ICD-10-CM | POA: Diagnosis not present

## 2016-11-16 DIAGNOSIS — I708 Atherosclerosis of other arteries: Secondary | ICD-10-CM | POA: Insufficient documentation

## 2016-11-19 ENCOUNTER — Other Ambulatory Visit: Payer: Self-pay | Admitting: Family Medicine

## 2016-11-19 ENCOUNTER — Encounter: Payer: Self-pay | Admitting: Family Medicine

## 2016-11-19 DIAGNOSIS — I739 Peripheral vascular disease, unspecified: Secondary | ICD-10-CM | POA: Insufficient documentation

## 2016-11-20 DIAGNOSIS — D122 Benign neoplasm of ascending colon: Secondary | ICD-10-CM | POA: Diagnosis not present

## 2016-11-20 DIAGNOSIS — R195 Other fecal abnormalities: Secondary | ICD-10-CM | POA: Diagnosis not present

## 2016-11-20 DIAGNOSIS — R194 Change in bowel habit: Secondary | ICD-10-CM | POA: Diagnosis not present

## 2016-11-20 DIAGNOSIS — D123 Benign neoplasm of transverse colon: Secondary | ICD-10-CM | POA: Diagnosis not present

## 2016-11-20 LAB — HM COLONOSCOPY

## 2016-11-21 DIAGNOSIS — J449 Chronic obstructive pulmonary disease, unspecified: Secondary | ICD-10-CM | POA: Diagnosis not present

## 2016-11-21 DIAGNOSIS — I471 Supraventricular tachycardia: Secondary | ICD-10-CM | POA: Diagnosis not present

## 2016-11-21 DIAGNOSIS — Z79899 Other long term (current) drug therapy: Secondary | ICD-10-CM | POA: Diagnosis not present

## 2016-11-21 DIAGNOSIS — I10 Essential (primary) hypertension: Secondary | ICD-10-CM | POA: Diagnosis not present

## 2016-11-21 DIAGNOSIS — Z7982 Long term (current) use of aspirin: Secondary | ICD-10-CM | POA: Diagnosis not present

## 2016-11-21 DIAGNOSIS — Z7984 Long term (current) use of oral hypoglycemic drugs: Secondary | ICD-10-CM | POA: Diagnosis not present

## 2016-11-21 DIAGNOSIS — I499 Cardiac arrhythmia, unspecified: Secondary | ICD-10-CM | POA: Diagnosis not present

## 2016-11-21 DIAGNOSIS — I2510 Atherosclerotic heart disease of native coronary artery without angina pectoris: Secondary | ICD-10-CM | POA: Diagnosis not present

## 2016-11-21 DIAGNOSIS — E119 Type 2 diabetes mellitus without complications: Secondary | ICD-10-CM | POA: Diagnosis not present

## 2016-11-21 DIAGNOSIS — I252 Old myocardial infarction: Secondary | ICD-10-CM | POA: Diagnosis not present

## 2016-11-21 DIAGNOSIS — Z955 Presence of coronary angioplasty implant and graft: Secondary | ICD-10-CM | POA: Diagnosis not present

## 2016-11-23 NOTE — Progress Notes (Signed)
Patient ID: [name] [name], male   DOB: 15-Apr-1941, 76 y.o.   MRN: [id number]   [name] [name] is seen today in F/U for CAD, HTN and elevated chol. Has a friend from [facility] living with him now since his wife died. Still selling rope. I believe he's had a stent to the OM in 96 and subsequent intervention to LAD. 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 He is not having any SSCP, palpitatoins SOB or edema. He has been compliant with his meds. His LDL has been under 100 with normal LFT's He has moderate carotid disease   Reviewed duplex from  09/26/14  and stable  Left ICA 60-79% RICA 0-39%  Due for repeat July 2016  No change   Has chronic venous insuf with occasional skin breakdown at ankles  Given handicap placard last visit   Put back on Toprol after SVT   Seen at [facility] 11/19/16 with SVT  K low labs otherwise ok TSH 3.41  Cardioverted to NSR  6 mg adenosine   ROS: Denies fever, malais, weight loss, blurry vision, decreased visual acuity, cough, sputum, SOB, hemoptysis, pleuritic pain, palpitaitons, heartburn, abdominal pain, melena, lower extremity edema, claudication, or rash.  All other systems reviewed and negative  General: Affect appropriate Healthy:  appears stated age 76: normal Neck supple with no adenopathy JVP normal left  bruits no thyromegaly Lungs clear with no wheezing and good diaphragmatic motion Heart:  S1/S2 no murmur, no rub, gallop or click PMI normal Abdomen: benighn, BS positve, no tenderness, no AAA no bruit.  No HSM or HJR Distal pulses intact with no bruits Plus one to two  bilateral edema with venouls insuf and erythema medial malleolus  Neuro non-focal Skin warm and dry No muscular weakness   Current Outpatient Prescriptions  Medication Sig Dispense Refill  . amLODipine (NORVASC) 10 MG tablet take 1 tablet by mouth once daily 90 tablet 3  . ANORO ELLIPTA 62.5-25 MCG/INH AEPB inhale 1 puff INTO THE LUNGS DAILY. 60 each 5  . aspirin 325 MG  tablet Take 325 mg by mouth daily.      Marland Kitchen atorvastatin (LIPITOR) 40 MG tablet take 1 tablet by mouth once daily 90 tablet 3  . Blood Glucose Monitoring Suppl (CONTOUR BLOOD GLUCOSE SYSTEM) DEVI Diagnoses - Diabetes E 11.8 Check blood sugar once daily 1 Device 0  . clopidogrel (PLAVIX) 75 MG tablet take 1 tablet by mouth once daily 90 tablet 1  . doxazosin (CARDURA) 4 MG tablet take 1 tablet by mouth at bedtime 90 tablet 1  . glipiZIDE (GLUCOTROL XL) 2.5 MG 24 hr tablet take 1 tablet by mouth once daily WITH BREAKFAST 30 tablet 3  . glucose blood (BAYER CONTOUR TEST) test strip Diagnoses - Diabetes E 11.8 Check blood sugar once daily 100 each prn  . hydrochlorothiazide (HYDRODIURIL) 25 MG tablet Take 1 tablet (25 mg total) by mouth every other day. 45 tablet 3  . losartan (COZAAR) 100 MG tablet take 1 tablet by mouth once daily 90 tablet 1  . metFORMIN (GLUCOPHAGE-XR) 500 MG 24 hr tablet take 2 tablets by mouth once daily with BREAKFAST 180 tablet 1  . metoprolol succinate (TOPROL-XL) 100 MG 24 hr tablet take 1 tablet by mouth once daily 90 tablet 1  . PROAIR HFA 108 (90 Base) MCG/ACT inhaler INAHLE 2 PUFFS INTO THE LUNGS EVERY 6 HOURS AS NEEDED FOR WHEEZING 8.5 Inhaler 2  . tamsulosin (FLOMAX) 0.4 MG CAPS capsule Take 1 capsule (  0.4 mg total) by mouth daily. 90 capsule 3  . traMADol (ULTRAM) 50 MG tablet take 1 tablet by mouth twice a day 30 tablet 0   No current facility-administered medications for this visit.     Allergies  Patient has no known allergies.  Electrocardiogram:  08/05/12  SR rate 51 normal ECG    10/19/14  SR rate 45 mormal 02/24/16  SR rate 96 LVH no acute changes  02/24/16 SR rate 54 RBBB LAFB minimal voltage for LVH Assessment and Plan  CAD: distant stent OM 96 continue medical Rx HTN:  Well controlled.  Continue current medications and low sodium Dash type diet.   Bradycardia:  Had lowered beta blocker and now on Toprol due to SVT HR 60's ok for now Chol:   Cholesterol  is at goal.  Continue current dose of statin and diet Rx.  No myalgias or side effects.  F/U  LFT's in 6 months. Lab Results  Component Value Date   LDLCALC 69 09/09/2015  Bruit:  Left ICA 60-79% stenosis.  No TIA symptoms.  Continue antiplatelet Rx and F/U carotid duplex due August 2018  Edema:  Dependant from chronic venous disease start HCTZ 25 mg f/u BMET 3 weeks  SVT:  Infrequent discussed vagal maneuvers. If recurs will refer to EP for consideration of Ablation    [employer]

## 2016-11-24 ENCOUNTER — Other Ambulatory Visit: Payer: Self-pay | Admitting: *Deleted

## 2016-11-24 DIAGNOSIS — I739 Peripheral vascular disease, unspecified: Secondary | ICD-10-CM

## 2016-11-25 ENCOUNTER — Encounter (INDEPENDENT_AMBULATORY_CARE_PROVIDER_SITE_OTHER): Payer: Self-pay

## 2016-11-25 ENCOUNTER — Encounter: Payer: Self-pay | Admitting: Cardiovascular Disease

## 2016-11-25 ENCOUNTER — Ambulatory Visit (INDEPENDENT_AMBULATORY_CARE_PROVIDER_SITE_OTHER): Payer: Medicare Other | Admitting: Cardiovascular Disease

## 2016-11-25 VITALS — BP 160/70 | HR 61 | Ht 70.0 in | Wt 195.4 lb

## 2016-11-25 DIAGNOSIS — R0989 Other specified symptoms and signs involving the circulatory and respiratory systems: Secondary | ICD-10-CM

## 2016-11-25 DIAGNOSIS — I739 Peripheral vascular disease, unspecified: Principal | ICD-10-CM

## 2016-11-25 DIAGNOSIS — I779 Disorder of arteries and arterioles, unspecified: Secondary | ICD-10-CM | POA: Diagnosis not present

## 2016-11-25 NOTE — Patient Instructions (Signed)
Medication Instructions:  Your physician recommends that you continue on your current medications as directed. Please refer to the Current Medication list given to you today.  Labwork: NONE  Testing/Procedures: Your physician has requested that you have a carotid duplex in August. This test is an ultrasound of the carotid arteries in your neck. It looks at blood flow through these arteries that supply the brain with blood. Allow one hour for this exam. There are no restrictions or special instructions.  Follow-Up: Your physician wants you to follow-up in: 6 months with Dr. Johnsie Cancel. You will receive a reminder letter in the mail two months in advance. If you don't receive a letter, please call our office to schedule the follow-up appointment.   If you need a refill on your cardiac medications before your next appointment, please call your pharmacy.

## 2016-11-26 ENCOUNTER — Other Ambulatory Visit: Payer: Self-pay

## 2016-11-26 ENCOUNTER — Encounter: Payer: Self-pay | Admitting: Family Medicine

## 2016-11-26 ENCOUNTER — Ambulatory Visit (INDEPENDENT_AMBULATORY_CARE_PROVIDER_SITE_OTHER): Payer: Medicare Other | Admitting: Family Medicine

## 2016-11-26 VITALS — BP 152/64 | HR 52 | Ht 70.0 in | Wt 195.0 lb

## 2016-11-26 DIAGNOSIS — L821 Other seborrheic keratosis: Secondary | ICD-10-CM

## 2016-11-26 DIAGNOSIS — I739 Peripheral vascular disease, unspecified: Secondary | ICD-10-CM | POA: Diagnosis not present

## 2016-11-26 DIAGNOSIS — I70299 Other atherosclerosis of native arteries of extremities, unspecified extremity: Secondary | ICD-10-CM

## 2016-11-26 DIAGNOSIS — I872 Venous insufficiency (chronic) (peripheral): Secondary | ICD-10-CM

## 2016-11-26 DIAGNOSIS — I708 Atherosclerosis of other arteries: Secondary | ICD-10-CM

## 2016-11-26 DIAGNOSIS — I700 Atherosclerosis of aorta: Secondary | ICD-10-CM

## 2016-11-26 MED ORDER — TRIAMCINOLONE ACETONIDE 0.5 % EX OINT: 1.0000 "application " | TOPICAL_OINTMENT | Freq: Every day | CUTANEOUS | 0 refills | Status: DC

## 2016-11-26 NOTE — Progress Notes (Signed)
Subjective:    CC: F/U PVD  HPI: 76 year old male here today to follow-up for lower extremity swelling. We decided to schedule him for ABIs and get some additional information. He [name] has known peripheral vascular disease in his carotids.  ABI results show: Technically challenging study. Aorto-iliac atherosclerosis. >50% common and external iliac artery stenosis, bilaterally. Heterogeneous plaque in both lower extremities. 50-74% right distal SFA stenosis. 50-74% left mid SFA stenosis.  We reviewed his results today. He actually says his swelling has improved significantly and really hasn't bothered him for couple of weeks but now he is getting some itching around the ankles and over the skin. He is getting some dermatitis there.He has been applying lotion and says it helps a little but he still itches.   He also has a seborrheic keratoses on his forehead and temples that he would like to have frozen off today.   Past medical history, Surgical history, Family history not pertinant except as noted below, Social history, Allergies, and medications have been entered into the medical record, reviewed, and corrections made.   Review of Systems: No fevers, chills, night sweats, weight loss, chest pain, or shortness of breath.   Objective:    General: Well Developed, well nourished, and in no acute distress.  Neuro: Alert and oriented x3, extra-ocular muscles intact, sensation grossly intact.  HEENT: Normocephalic, atraumatic  Skin: Warm and dry, no rashes. Cardiac: Regular rate and rhythm, no murmurs rubs or gallops, no lower extremity edema.  Respiratory: Clear to auscultation bilaterally. Not using accessory muscles, speaking in full sentences. Ext: His swelling in his ankles and feet is much improved. Actually able to see skin creases across the top of his foot. He does have some thickening of skin especially medially on both legs with a little bit of dusky appearance. Open wounds or  drainage or ulcerations.   Impression and Recommendations:    Peripheral vascular disease of the lower extremities-Reviewed results with him and let him know that we did go ahead and place referral on Monday to the vascular doctor in [place]. ( [facility] [place]) discussed with him that they may decide to just monitor him or could consider intervention or just working on trying to increase exercise.  Venous stasis dermatitis-we'll treat with topical steroid cream. Continue with adding his moisturizer/lotion as well and if not improving over the next week and please give [place] a call back.  Seborrheic keratoses-cryotherapy performed patient tolerated well.    Cryotherapy Procedure Note  Pre-operative Diagnosis: Seborrheic keratoses  Post-operative Diagnosis: seb keratoses  Locations: forehead and teamples, treatment of 15 lesions.   Indications: irritation  Anesthesia: not required    Procedure Details  Patient informed of risks (permanent scarring, infection, light or dark discoloration, bleeding, infection, weakness, numbness and recurrence of the lesion) and benefits of the procedure and verbal informed consent obtained.  The areas are treated with liquid nitrogen therapy, frozen until ice ball extended 1-2 mm beyond lesion, allowed to thaw, and treated again. The patient tolerated procedure well.  The patient was instructed on post-op care, warned that there may be blister formation, redness and pain. Recommend OTC analgesia as needed for pain.  Condition: Stable  Complications: none.  Plan: 1. Instructed to keep the area dry and covered for 24-48h and clean thereafter. 2. Warning signs of infection were reviewed.   3. Recommended that the patient use OTC acetaminophen as needed for pain.  4. Return PRN.

## 2016-11-29 ENCOUNTER — Encounter: Payer: Self-pay | Admitting: Family Medicine

## 2016-12-09 ENCOUNTER — Other Ambulatory Visit: Payer: Self-pay | Admitting: Family Medicine

## 2016-12-12 ENCOUNTER — Other Ambulatory Visit: Payer: Self-pay | Admitting: Cardiovascular Disease

## 2016-12-22 ENCOUNTER — Encounter: Payer: Self-pay | Admitting: Vascular Surgery

## 2016-12-30 ENCOUNTER — Encounter: Payer: Medicare Other | Admitting: Vascular Surgery

## 2017-01-04 DIAGNOSIS — H524 Presbyopia: Secondary | ICD-10-CM | POA: Diagnosis not present

## 2017-01-06 ENCOUNTER — Other Ambulatory Visit: Payer: Self-pay | Admitting: Family Medicine

## 2017-01-13 ENCOUNTER — Encounter: Payer: Self-pay | Admitting: Vascular Surgery

## 2017-01-20 ENCOUNTER — Ambulatory Visit (INDEPENDENT_AMBULATORY_CARE_PROVIDER_SITE_OTHER): Payer: Medicare Other | Admitting: Vascular Surgery

## 2017-01-20 ENCOUNTER — Encounter: Payer: Self-pay | Admitting: Vascular Surgery

## 2017-01-20 VITALS — BP 150/67 | HR 41 | Temp 97.5°F | Resp 16 | Ht 70.0 in | Wt 192.0 lb

## 2017-01-20 DIAGNOSIS — I739 Peripheral vascular disease, unspecified: Secondary | ICD-10-CM

## 2017-01-20 NOTE — Progress Notes (Signed)
Patient name: [name] [name] MRN: [id number] DOB: Jan 16, 1941 Sex: male   REASON FOR CONSULT:    Peripheral vascular disease. The referral was from [facility], [place].  HPI:   [name] [name] is a 76 y.o. male, who presents for evaluation of peripheral vascular disease. He denies any history of claudication, rest pain, or nonhealing ulcers. He denies any previous history of DVT. 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  I have reviewed the note from 11/26/2016 from Dr. Gardiner Ramus office. The patient was being followed with lower extremity swelling. The patient was worked up for peripheral vascular disease with an arterial Doppler study and arterial duplex. These results are discussed below. Based on these results, he was sent for evaluation for peripheral vascular disease.  His risk factors for peripheral vascular disease include diabetes, hypertension, hypercholesterolemia, and a family history of premature cardiovascular disease. He is not a smoker.  Past Medical History:  Diagnosis Date  . Allergic rhinitis   . Bigeminy   . Bradycardia   . Bruit   . CAD (coronary artery disease)   . Claudication ([place])   . Diabetes mellitus type II   . Dyspnea   . History of prostate cancer    Dr. Gaynelle Arabian  . Hypercholesteremia    II A  . Hypertension   . Malignant neoplasm of prostate ([place])   . Microalbuminuria   . Seborrheic keratosis     Family History  Problem Relation Age of Onset  . Heart attack Father   . Coronary artery disease Brother     SOCIAL HISTORY: Social History   Social History  . Marital status: Married    Spouse name: N/A  . Number of children: N/A  . Years of education: N/A   Occupational History  . 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 Smoking status: Former Smoker    Packs/day: 1.00    Years: 20.00    Quit date: 04/22/2003  . Smokeless tobacco: Never Used  . Alcohol use 0.5 oz/week    1 Standard drinks or  equivalent per week  . Drug use: Unknown  . Sexual activity: Not on file   Other Topics Concern  . Not on file   Social History Narrative   Widowed 08/2009   Has 4 kids, 2 are local   Fair diet   No regular exercise    No Known Allergies  Current Outpatient Prescriptions  Medication Sig Dispense Refill  . amLODipine (NORVASC) 10 MG tablet take 1 tablet by mouth once daily 90 tablet 3  . ANORO ELLIPTA 62.5-25 MCG/INH AEPB inhale 1 puff INTO THE LUNGS DAILY. 60 each 5  . aspirin 325 MG tablet Take 325 mg by mouth daily.      Marland Kitchen atorvastatin (LIPITOR) 40 MG tablet take 1 tablet by mouth once daily 90 tablet 3  . Blood Glucose Monitoring Suppl (CONTOUR BLOOD GLUCOSE SYSTEM) DEVI Diagnoses - Diabetes E 11.8 Check blood sugar once daily 1 Device 0  . clopidogrel (PLAVIX) 75 MG tablet take 1 tablet by mouth once daily 90 tablet 2  . doxazosin (CARDURA) 4 MG tablet take 1 tablet by mouth at bedtime 90 tablet 0  . glipiZIDE (GLUCOTROL XL) 2.5 MG 24 hr tablet take 1 tablet by mouth once daily WITH BREAKFAST 30 tablet 3  . glucose blood (BAYER CONTOUR TEST) test strip Diagnoses - Diabetes E 11.8 Check blood sugar once daily 100 each prn  .  hydrochlorothiazide (HYDRODIURIL) 25 MG tablet Take 1 tablet (25 mg total) by mouth every other day. 45 tablet 3  . losartan (COZAAR) 100 MG tablet take 1 tablet by mouth once daily 90 tablet 2  . metFORMIN (GLUCOPHAGE-XR) 500 MG 24 hr tablet take 2 tablets by mouth once daily with BREAKFAST 180 tablet 1  . metoprolol succinate (TOPROL-XL) 100 MG 24 hr tablet take 1 tablet by mouth once daily 90 tablet 1  . PROAIR HFA 108 (90 Base) MCG/ACT inhaler INAHLE 2 PUFFS INTO THE LUNGS EVERY 6 HOURS AS NEEDED FOR WHEEZING 8.5 Inhaler 2  . tamsulosin (FLOMAX) 0.4 MG CAPS capsule Take 1 capsule (0.4 mg total) by mouth daily. 90 capsule 3  . traMADol (ULTRAM) 50 MG tablet take 1 tablet by mouth twice a day 30 tablet 0  . triamcinolone ointment (KENALOG) 0.5 % Apply 1  application topically at bedtime. 45 g 0   No current facility-administered medications for this visit.     REVIEW OF SYSTEMS:  [X]  denotes positive finding, [ ]  denotes negative finding Cardiac  Comments:  Chest pain or chest pressure:    Shortness of breath upon exertion: X   Short of breath when lying flat:    Irregular heart rhythm:        Vascular    Pain in calf, thigh, or hip brought on by ambulation:    Pain in feet at night that wakes you up from your sleep:     Blood clot in your veins:    Leg swelling:         Pulmonary    Oxygen at home:    Productive cough:     Wheezing:         Neurologic    Sudden weakness in arms or legs:     Sudden numbness in arms or legs:     Sudden onset of difficulty speaking or slurred speech:    Temporary loss of vision in one eye:     Problems with dizziness:         Gastrointestinal    Blood in stool:     Vomited blood:         Genitourinary    Burning when urinating:     Blood in urine:        Psychiatric    Major depression:         Hematologic    Bleeding problems:    Problems with blood clotting too easily:        Skin    Rashes or ulcers:        Constitutional    Fever or chills:     PHYSICAL EXAM:   Vitals:   01/20/17 0906  Weight: 192 lb (87.1 kg)    GENERAL: The patient is a well-nourished male, in no acute distress. The vital signs are documented above. CARDIAC: There is a regular rate and rhythm.  VASCULAR: I do not detect carotid bruits. On the right side, he has a slightly diminished right femoral pulse. He has a popliteal and posterior tibial pulse on the right. On the left side he has a palpable femoral, popliteal, and posterior tibial pulse. He has no significant lower extremity swelling. He does have some hyperpigmentation bilaterally consistent with chronic venous insufficiency. PULMONARY: There is good air exchange bilaterally without wheezing or rales. ABDOMEN: Soft and non-tender with  normal pitched bowel sounds. I do not palpate an abdominal aortic aneurysm. MUSCULOSKELETAL: There are no major deformities or  cyanosis. NEUROLOGIC: No focal weakness or paresthesias are detected. SKIN: There are no ulcers or rashes noted. PSYCHIATRIC: The patient has a normal affect.  DATA:    ARTERIAL DOPPLER STUDY: I have reviewed the arterial Doppler study that was done on 11/19/2016.   On the right side there is a biphasic posterior tibial signal with a monophasic dorsalis pedis and peroneal signal. ABI is 96%. Toe pressure is 99 mmHg.  On the left side there is a monophasic posterior tibial, dorsalis pedis, and peroneal signal. ABI is 84%. Toe pressure on the left is 103 mmHg.  ARTERIAL DUPLEX STUDY: I have reviewed the arterial duplex study was done on 11/18/2016. This showed evidence of aortoiliac occlusive disease. There was a greater than 50% common iliac artery stenosis and external iliac artery stenosis bilaterally. There was a moderate stenosis of the distal right superficial femoral artery and a moderate stenosis in the mid left superficial femoral artery.   MEDICAL ISSUES:   PERIPHERAL VASCULAR DISEASE: Although his noninvasive studies do show evidence of aortoiliac disease and superficial femoral artery occlusive disease, I can palpate posterior tibial pulses in both feet and he is asymptomatic. He is not a smoker. He is on aspirin and is on a statin. I encouraged him to stay as active as possible. He does have some evidence of chronic venous insufficiency with hyperpigmentation in both legs and we have discussed the importance intermittent leg elevation in the proper positioning for this. I will see him back as needed.  Deitra Mayo [facility] [phone]

## 2017-02-03 ENCOUNTER — Other Ambulatory Visit: Payer: Self-pay | Admitting: Physician Assistant

## 2017-02-04 NOTE — Telephone Encounter (Signed)
Ok to fill? Wasn't sure of the dx this was for.

## 2017-02-08 ENCOUNTER — Other Ambulatory Visit: Payer: Self-pay | Admitting: Family Medicine

## 2017-02-09 ENCOUNTER — Encounter: Payer: Self-pay | Admitting: Family Medicine

## 2017-02-09 ENCOUNTER — Ambulatory Visit (INDEPENDENT_AMBULATORY_CARE_PROVIDER_SITE_OTHER): Payer: Medicare Other | Admitting: Family Medicine

## 2017-02-09 VITALS — BP 138/76 | HR 54 | Ht 70.0 in | Wt 194.0 lb

## 2017-02-09 DIAGNOSIS — R197 Diarrhea, unspecified: Secondary | ICD-10-CM

## 2017-02-09 DIAGNOSIS — L723 Sebaceous cyst: Secondary | ICD-10-CM

## 2017-02-09 DIAGNOSIS — M19042 Primary osteoarthritis, left hand: Secondary | ICD-10-CM | POA: Diagnosis not present

## 2017-02-09 MED ORDER — METFORMIN HCL ER 500 MG PO TB24: ORAL_TABLET | ORAL | 1 refills | Status: DC

## 2017-02-09 NOTE — Progress Notes (Signed)
Subjective:    Patient ID: [name] [name], male    DOB: 1941-07-24, 76 y.o.   MRN: [id number]  HPI 76 year old male comes in today complaining of recurrence of a subcutaneous cyst on the the right side of his neck. We did a removal proximal leg 5 years ago in 2013 but it has recurred.   C/O of left hand pain and swelling. Says the knuckle at the first finger on the left hand feels tight and stiff when he bends his hand. Is it bothers him most because he is having difficulty playing his guitar. He has not tried any treatments.  He still having diarrhea particularly in the mornings. He did recently have a colonoscopy and everything looked great. He was told it was likely from the metformin.    Review of Systems  BP 138/76   Pulse (!) 54   Ht 5\' 10"  (1.778 m)   Wt 194 lb (88 kg)   SpO2 96%   BMI 27.84 kg/m     No Known Allergies  Past Medical History:  Diagnosis Date  . Allergic rhinitis   . Bigeminy   . Bradycardia   . Bruit   . CAD (coronary artery disease)   . Claudication ([place])   . Diabetes mellitus type II   . Dyspnea   . History of prostate cancer    Dr. Gaynelle Arabian  . Hypercholesteremia    II A  . Hypertension   . Malignant neoplasm of prostate ([place])   . Microalbuminuria   . Seborrheic keratosis     Past Surgical History:  Procedure Laterality Date  . CORONARY ANGIOPLASTY WITH STENT PLACEMENT      Social History   Social History  . Marital status: Married    Spouse name: N/A  . Number of children: N/A  . Years of education: N/A   Occupational History  . 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 Smoking status: Former Smoker    Packs/day: 1.00    Years: 20.00    Quit date: 04/22/2003  . Smokeless tobacco: Never Used  . Alcohol use 0.5 oz/week    1 Standard drinks or equivalent per week  . Drug use: Unknown  . Sexual activity: Not on file   Other Topics Concern  . Not on file   Social History Narrative   Widowed 08/2009   Has 4 kids, 2 are local   Fair diet   No regular exercise    Family History  Problem Relation Age of Onset  . Heart attack Father   . Coronary artery disease Brother     Outpatient Encounter Prescriptions as of 02/09/2017  Medication Sig  . amLODipine (NORVASC) 10 MG tablet take 1 tablet by mouth once daily  . ANORO ELLIPTA 62.5-25 MCG/INH AEPB inhale 1 puff INTO THE LUNGS DAILY.  Marland Kitchen aspirin 325 MG tablet Take 325 mg by mouth daily.    Marland Kitchen atorvastatin (LIPITOR) 40 MG tablet take 1 tablet by mouth once daily  . Blood Glucose Monitoring Suppl (CONTOUR BLOOD GLUCOSE SYSTEM) DEVI Diagnoses - Diabetes E 11.8 Check blood sugar once daily  . clopidogrel (PLAVIX) 75 MG tablet take 1 tablet by mouth once daily  . doxazosin (CARDURA) 4 MG tablet take 1 tablet by mouth at bedtime  . glipiZIDE (GLUCOTROL XL) 2.5 MG 24 hr tablet Take 1 tablet (2.5 mg total) by mouth daily with breakfast. Needs A1c  . glucose blood (BAYER CONTOUR TEST) test  strip Diagnoses - Diabetes E 11.8 Check blood sugar once daily  . hydrochlorothiazide (HYDRODIURIL) 25 MG tablet Take 1 tablet (25 mg total) by mouth every other day.  . losartan (COZAAR) 100 MG tablet take 1 tablet by mouth once daily  . metFORMIN (GLUCOPHAGE-XR) 500 MG 24 hr tablet take 2 tablets by mouth once daily with BREAKFAST  . metoprolol succinate (TOPROL-XL) 100 MG 24 hr tablet take 1 tablet by mouth once daily  . PROAIR HFA 108 (90 Base) MCG/ACT inhaler INAHLE 2 PUFFS INTO THE LUNGS EVERY 6 HOURS AS NEEDED FOR WHEEZING  . tamsulosin (FLOMAX) 0.4 MG CAPS capsule Take 1 capsule (0.4 mg total) by mouth daily.  . traMADol (ULTRAM) 50 MG tablet take 1 tablet by mouth twice a day  . triamcinolone ointment (KENALOG) 0.5 % Apply 1 application topically at bedtime.  . [DISCONTINUED] metFORMIN (GLUCOPHAGE-XR) 500 MG 24 hr tablet take 2 tablets by mouth once daily with BREAKFAST   No facility-administered encounter medications on file as of  02/09/2017.          Objective:   Physical Exam  Constitutional: He is oriented to person, place, and time. He appears well-developed and well-nourished.  HENT:  Head: Normocephalic and atraumatic.  Eyes: Conjunctivae and EOM are normal.  Cardiovascular: Normal rate.   Pulmonary/Chest: Effort normal.  Neurological: He is alert and oriented to person, place, and time.  Skin: Skin is dry. No pallor.  Right side of neck he has an approx 1.5 x 2 cm. Sebaceous cyst.    Psychiatric: He has a normal mood and affect. His behavior is normal.  Vitals reviewed.      Assessment & Plan:  Epidermal inclusion cyst-recommend referral to dermatology for formal excision.  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  We discussed that the main treatment is some gentle stretches as well as Tylenol or anti-inflammatory as needed. Can schedule with sports med for injection if he would like..  Diarrhea-discussed with him that if he wants to hold the metformin for a few weeks just to see if it makes a difference he certainly can. Also encouraged him to schedule his diabetic follow-up next month as he is behind.

## 2017-03-14 ENCOUNTER — Other Ambulatory Visit: Payer: Self-pay | Admitting: Family Medicine

## 2017-03-15 ENCOUNTER — Ambulatory Visit (INDEPENDENT_AMBULATORY_CARE_PROVIDER_SITE_OTHER): Payer: Medicare Other | Admitting: Family Medicine

## 2017-03-15 ENCOUNTER — Other Ambulatory Visit: Payer: Self-pay | Admitting: Family Medicine

## 2017-03-15 ENCOUNTER — Encounter: Payer: Self-pay | Admitting: Family Medicine

## 2017-03-15 VITALS — BP 137/58 | HR 59 | Ht 70.0 in | Wt 193.0 lb

## 2017-03-15 DIAGNOSIS — E119 Type 2 diabetes mellitus without complications: Secondary | ICD-10-CM | POA: Diagnosis not present

## 2017-03-15 DIAGNOSIS — K529 Noninfective gastroenteritis and colitis, unspecified: Secondary | ICD-10-CM

## 2017-03-15 DIAGNOSIS — N183 Chronic kidney disease, stage 3 (moderate): Secondary | ICD-10-CM

## 2017-03-15 DIAGNOSIS — I10 Essential (primary) hypertension: Secondary | ICD-10-CM

## 2017-03-15 DIAGNOSIS — I872 Venous insufficiency (chronic) (peripheral): Secondary | ICD-10-CM | POA: Insufficient documentation

## 2017-03-15 DIAGNOSIS — R351 Nocturia: Secondary | ICD-10-CM

## 2017-03-15 DIAGNOSIS — N1832 Chronic kidney disease, stage 3b: Secondary | ICD-10-CM

## 2017-03-15 LAB — POCT GLYCOSYLATED HEMOGLOBIN (HGB A1C): HEMOGLOBIN A1C: 6.9

## 2017-03-15 MED ORDER — GLIPIZIDE ER 2.5 MG PO TB24: 2.5000 mg | ORAL_TABLET | Freq: Every day | ORAL | 3 refills | Status: DC

## 2017-03-15 NOTE — Patient Instructions (Addendum)
Consider probiotic: Florastor or Cuterelle or see if your Pharmacist has a suggestion for the frequent stools.    The rash on your legs is venous stasis dermatitis

## 2017-03-15 NOTE — Progress Notes (Signed)
Subjective:    CC: DM  HPI: Diabetes - no hypoglycemic events. No wounds or sores that are not healing well. No increased thirst or urination. Checking glucose at home. Taking medications as prescribed without any side effects.He did decrease his metformin to once a day because of the diarrhea. He does feel like that that has helped to decrease his dose. He says it's not really diarrhea as much as frequent stools that are very urgent.  Hypertension- Pt denies chest pain, SOB, dizziness, or heart palpitations.  Taking meds as directed w/o problems.  Denies medication side effects.    He also complains of frequent stools as noted above. They're not so much diarrhea as they are just frequent and urgent. He still just very frustrated. It's a little bit better with a decrease of the metformin but he doesn't want to stop the metformin completely because it does help control his blood sugars. Plus at one time he did stop it completely and it really did not resolve his problem. He had a normal colonoscopy. He wants to know for probiotic could be helpful.   He also complains of ankle swelling and a dark hyperpigmented rash on his lower legs. He says at one point they were swelling quite significantly but now it seems to just be swelling mildly. He says right now they are swollen as they typically get. He says most the time they don't really bother him. He wants to know what to do about it.  He also still complains of frequent urination. He's been working with a neurologist for quite some time. He had a urologic procedure that unfortunately caused this problem. Has been tried on multiple prostate medications including overactive bladder medications none of which have been very helpful.  Past medical history, Surgical history, Family history not pertinant except as noted below, Social history, Allergies, and medications have been entered into the medical record, reviewed, and corrections made.   Review of  Systems: No fevers, chills, night sweats, weight loss, chest pain, or shortness of breath.   Objective:    General: Well Developed, well nourished, and in no acute distress.  Neuro: Alert and oriented x3, extra-ocular muscles intact, sensation grossly intact.  HEENT: Normocephalic, atraumatic  Skin: Warm and dry, no rashes. Cardiac: Regular rate and rhythm, no murmurs rubs or gallops,1+ lower extremities edema of the ankles bilaterally. He has significant hyperpigmentation of the lower legs as well.  Respiratory: Clear to auscultation bilaterally. Not using accessory muscles, speaking in full sentences.   Impression and Recommendations:   DM- Well controlled though Hemoccult A1c up to 6.9 today which is up from previous of 6.4. He did drop his metformin down to 1 tab which would explain the slight increase in A1c. Encouraged him to continue work on [employer] and regular exercise. His diarrhea is much better on the lower dose of metformin. Follow-up in 3-4 months. If at any point his A1c starts increasing again then can consider increasing his glipizide.  HTN - much improved on repeat check of blood pressure. Due for BMP. Lab slip provided.  Frequent stools-discussed a trial of a probiotic. At this certainly think this would be reasonable and completely harmless. He could try this for a month and see if it helps. He is [name] seeing GI and felt like they were really not helpful.  Venous stasis dermatitis-described name of the condition and discussed that the hyperpigmentation does not usually resolve. The main treatment is usually compression stockings as long as  there is no significant peripheral vascular disease. He says he is not interested in wearing the stockings since it doesn't really bother him.  CKD-due to recheck renal function. Lab slip provided.  Frequent urination/nocturia-unfortunately this point he really has tried a lot of different options I don't think there is going to be  anything particularly helpful for him. Right now he says he gets up about every hour and a half at night urinate. I did encourage him to consider one of the overactive bladder medications even if it only allows him to get 2-2.5  hours of sleep per night before waking to urinate. Even that small improvement can reduce the total number of times that he is waking up. He also drink a lot of fluid at night. Encouraged him to switch to water instead of caffeinated tea and a cut off any increase fluids at least 2 hours before bedtime.  Time spent 40 minutes, greater than 50% time spent discussing/counseling about diabetes, hypertension, frequent stools, frequent urination/nocturia, CKD, and venous stasis dermatitis.

## 2017-03-22 DIAGNOSIS — D229 Melanocytic nevi, unspecified: Secondary | ICD-10-CM | POA: Diagnosis not present

## 2017-03-22 DIAGNOSIS — L821 Other seborrheic keratosis: Secondary | ICD-10-CM | POA: Diagnosis not present

## 2017-03-22 DIAGNOSIS — D692 Other nonthrombocytopenic purpura: Secondary | ICD-10-CM | POA: Diagnosis not present

## 2017-04-07 ENCOUNTER — Other Ambulatory Visit: Payer: Self-pay | Admitting: Family Medicine

## 2017-04-14 DIAGNOSIS — I10 Essential (primary) hypertension: Secondary | ICD-10-CM | POA: Diagnosis not present

## 2017-04-14 DIAGNOSIS — E119 Type 2 diabetes mellitus without complications: Secondary | ICD-10-CM | POA: Diagnosis not present

## 2017-04-14 DIAGNOSIS — N183 Chronic kidney disease, stage 3 (moderate): Secondary | ICD-10-CM | POA: Diagnosis not present

## 2017-04-14 LAB — COMPLETE METABOLIC PANEL WITH GFR
ALBUMIN: 4.2 g/dL (ref 3.6–5.1)
ALK PHOS: 90 U/L (ref 40–115)
ALT: 17 U/L (ref 9–46)
AST: 15 U/L (ref 10–35)
BUN: 29 mg/dL — ABNORMAL HIGH (ref 7–25)
CO2: 23 mmol/L (ref 20–31)
Calcium: 9 mg/dL (ref 8.6–10.3)
Chloride: 105 mmol/L (ref 98–110)
Creat: 1.23 mg/dL — ABNORMAL HIGH (ref 0.70–1.18)
GFR, EST AFRICAN AMERICAN: 66 mL/min (ref >=60)
GFR, EST NON AFRICAN AMERICAN: 57 mL/min — AB (ref >=60)
GLUCOSE: 158 mg/dL — AB (ref 65–99)
POTASSIUM: 4.2 mmol/L (ref 3.5–5.3)
SODIUM: 139 mmol/L (ref 135–146)
Total Bilirubin: 0.6 mg/dL (ref 0.2–1.2)
Total Protein: 6.6 g/dL (ref 6.1–8.1)

## 2017-04-14 LAB — LIPID PANEL W/REFLEX DIRECT LDL
CHOL/HDL RATIO: 3.2 ratio (ref <5.0)
Cholesterol: 89 mg/dL (ref <200)
HDL: 28 mg/dL — AB (ref >40)
LDL-Cholesterol: 43 mg/dL
Non-HDL Cholesterol (Calc): 61 mg/dL (ref <130)
TRIGLYCERIDES: 94 mg/dL (ref <150)

## 2017-04-19 ENCOUNTER — Telehealth: Payer: Self-pay | Admitting: *Deleted

## 2017-04-19 NOTE — Telephone Encounter (Signed)
Patient states he can no longer afford the anoro and would like something comparable. His copay went up drastically. If you decide to send something in and its still too expensive I will ask him to check with his insurance. Looks like he has been on spiriva,stiolto in the past

## 2017-04-20 MED ORDER — GLYCOPYRROLATE-FORMOTEROL 9-4.8 MCG/ACT IN AERO: 2.0000 | INHALATION_SPRAY | Freq: Two times a day (BID) | RESPIRATORY_TRACT | 11 refills | Status: DC

## 2017-04-20 NOTE — Telephone Encounter (Signed)
Alternative setn

## 2017-04-20 NOTE — Telephone Encounter (Signed)
Can you route this to one of the other providers? Thank you.

## 2017-04-20 NOTE — Telephone Encounter (Signed)
Routing to Provider in office for review.  

## 2017-04-21 NOTE — Telephone Encounter (Signed)
Left update on Pt's VM, callback provided for any questions.  

## 2017-04-26 ENCOUNTER — Other Ambulatory Visit: Payer: Self-pay | Admitting: Family Medicine

## 2017-04-26 ENCOUNTER — Other Ambulatory Visit: Payer: Self-pay | Admitting: Cardiovascular Disease

## 2017-04-30 ENCOUNTER — Ambulatory Visit (HOSPITAL_COMMUNITY)
Admission: RE | Admit: 2017-04-30 | Discharge: 2017-04-30 | Disposition: A | Discharge status: Home / Self Care | Payer: Medicare Other | Source: Ambulatory Visit | Attending: Cardiovascular Disease | Admitting: Cardiovascular Disease

## 2017-04-30 DIAGNOSIS — R0989 Other specified symptoms and signs involving the circulatory and respiratory systems: Secondary | ICD-10-CM | POA: Diagnosis not present

## 2017-04-30 DIAGNOSIS — I6523 Occlusion and stenosis of bilateral carotid arteries: Secondary | ICD-10-CM | POA: Diagnosis not present

## 2017-04-30 DIAGNOSIS — E1151 Type 2 diabetes mellitus with diabetic peripheral angiopathy without gangrene: Secondary | ICD-10-CM | POA: Insufficient documentation

## 2017-04-30 DIAGNOSIS — I10 Essential (primary) hypertension: Secondary | ICD-10-CM | POA: Insufficient documentation

## 2017-04-30 DIAGNOSIS — I739 Peripheral vascular disease, unspecified: Secondary | ICD-10-CM

## 2017-04-30 DIAGNOSIS — Z87891 Personal history of nicotine dependence: Secondary | ICD-10-CM | POA: Diagnosis not present

## 2017-04-30 DIAGNOSIS — J449 Chronic obstructive pulmonary disease, unspecified: Secondary | ICD-10-CM | POA: Diagnosis not present

## 2017-04-30 DIAGNOSIS — E119 Type 2 diabetes mellitus without complications: Secondary | ICD-10-CM | POA: Diagnosis not present

## 2017-04-30 DIAGNOSIS — E785 Hyperlipidemia, unspecified: Secondary | ICD-10-CM | POA: Diagnosis not present

## 2017-04-30 DIAGNOSIS — I2510 Atherosclerotic heart disease of native coronary artery without angina pectoris: Secondary | ICD-10-CM | POA: Insufficient documentation

## 2017-04-30 DIAGNOSIS — I779 Disorder of arteries and arterioles, unspecified: Secondary | ICD-10-CM

## 2017-05-11 ENCOUNTER — Telehealth: Payer: Self-pay

## 2017-05-11 DIAGNOSIS — I739 Peripheral vascular disease, unspecified: Principal | ICD-10-CM

## 2017-05-11 DIAGNOSIS — I779 Disorder of arteries and arterioles, unspecified: Secondary | ICD-10-CM

## 2017-05-11 NOTE — Telephone Encounter (Signed)
Patient aware of carotid results. Per Dr. Johnsie Cancel, 43-27% LICA stenosis.  F/U duplex in one year. Patient verbalized understanding and a recall letter will be sent for carotid duplex in one year.

## 2017-05-11 NOTE — Telephone Encounter (Signed)
-----   Message from Josue Hector, MD sent at 05/02/2017  1:43 PM EDT ----- 58-09% LICA stenosis.  F/U duplex in one year

## 2017-05-17 ENCOUNTER — Other Ambulatory Visit: Payer: Self-pay | Admitting: Family Medicine

## 2017-05-31 ENCOUNTER — Telehealth: Payer: Self-pay | Admitting: *Deleted

## 2017-05-31 NOTE — Telephone Encounter (Signed)
Pt called and stated that he cannot afford the Anoro. It will cost him $145 to refill. He wanted to know if there is something else that he can take that is compatible to the Anoro.   Will fwd to pcp for advice.[name] [name] [place]

## 2017-06-03 NOTE — Telephone Encounter (Signed)
Looked back in pt's chart and it looked like Dr. Sheppard Coil had sent something back in July for him. I advised him of this and called the pharmacy to inquire about the price and was told that since [facility] was closed they did not have this in his profile there and could not give a price. I told him about this and he stated not to worry about it.Elouise Munroe'

## 2017-06-16 ENCOUNTER — Ambulatory Visit (INDEPENDENT_AMBULATORY_CARE_PROVIDER_SITE_OTHER): Payer: Medicare Other | Admitting: Family Medicine

## 2017-06-16 ENCOUNTER — Encounter: Payer: Self-pay | Admitting: Family Medicine

## 2017-06-16 VITALS — BP 142/62 | HR 58 | Ht 70.0 in | Wt 195.0 lb

## 2017-06-16 DIAGNOSIS — E119 Type 2 diabetes mellitus without complications: Secondary | ICD-10-CM

## 2017-06-16 DIAGNOSIS — S161XXA Strain of muscle, fascia and tendon at neck level, initial encounter: Secondary | ICD-10-CM | POA: Diagnosis not present

## 2017-06-16 DIAGNOSIS — J449 Chronic obstructive pulmonary disease, unspecified: Secondary | ICD-10-CM

## 2017-06-16 DIAGNOSIS — Z23 Encounter for immunization: Secondary | ICD-10-CM

## 2017-06-16 DIAGNOSIS — I10 Essential (primary) hypertension: Secondary | ICD-10-CM | POA: Diagnosis not present

## 2017-06-16 LAB — POCT GLYCOSYLATED HEMOGLOBIN (HGB A1C): HEMOGLOBIN A1C: 7.4

## 2017-06-16 MED ORDER — GLIPIZIDE ER 5 MG PO TB24: 5.0000 mg | ORAL_TABLET | Freq: Every day | ORAL | 1 refills | Status: DC

## 2017-06-16 NOTE — Progress Notes (Signed)
Subjective:    CC: DM, COPD, BP  HPI:  Diabetes - no hypoglycemic events. No wounds or sores that are not healing well. No increased thirst or urination. Checking glucose at home. Taking medications as prescribed without any side effects.  He has been eating alof fo chocolate cake lately.    Hypertension- Pt denies chest pain, SOB, dizziness, or heart palpitations.  Taking meds as directed w/o problems.  Denies medication side effects.    Has been having some problems with his neck. 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   He did go see the dermatologist about having the sebaceous cyst removed off of his neck. 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 He's not sure he really wants to go back there again.  COPD - unfortunately he has it as Medicare gap and so all inhalers are $150 or more. Says sometimes his breathing keeps him from being as active. That he does not exercise regularly.  Past medical history, Surgical history, Family history not pertinant except as noted below, Social history, Allergies, and medications have been entered into the medical record, reviewed, and corrections made.   Review of Systems: No fevers, chills, night sweats, weight loss, chest pain.  Objective:    General: Well Developed, well nourished, and in no acute distress.  Neuro: Alert and oriented x3, extra-ocular muscles intact, sensation grossly intact.  HEENT: Normocephalic, atraumatic  Skin: Warm and dry, no rashes. Cardiac: Regular rate and rhythm, no murmurs rubs or gallops, no lower extremity edema.  Respiratory: Clear to auscultation bilaterally. Not using accessory muscles, speaking in full sentences. MSK: Normal flexion extension of the cervical spine. Decreased rotation right and left but it is symmetric.   Impression and Recommendations:    DM -  Uncontrolled.  Hemoglobin A1c at 7.4 from previous of 6.9.  Will increase glipizide to 5 XL.  Discussed the importance of getting diet back under control. He's had a lot of indiscretions with this. He says he will work on it. Follow-up in 3 months.  HTN - repeat improved but not at goal. Work on diet and ecercise. Discussed silver sneaker or maybe joining the gym and cutting back on sweets.   COPD - try to call the drug representative for a nor evidence atraumatic able to get him some samples since unfortunately he has Medicare gap.  Cervical strain - will give him copy of neck exercises.  If not improving them please let me know. 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 If he has his head tilted down her upward for long. The time and that may also be causing some tension in the neck.

## 2017-06-16 NOTE — Patient Instructions (Signed)
Cut out sweets and watch your carbs!   No biscuits!

## 2017-06-21 DIAGNOSIS — C61 Malignant neoplasm of prostate: Secondary | ICD-10-CM | POA: Diagnosis not present

## 2017-06-24 ENCOUNTER — Other Ambulatory Visit: Payer: Self-pay | Admitting: Family Medicine

## 2017-07-06 ENCOUNTER — Other Ambulatory Visit: Payer: Self-pay | Admitting: Family Medicine

## 2017-07-20 NOTE — Progress Notes (Signed)
Patient ID: [name] [name], male   DOB: [birth year], 76 y.o.   MRN: [id number]   K[place]ten is seen today in F/U for CAD, HTN and elevated chol. Has a friend from [facility] living with him now since his wife died. Still selling rope. I believe he's had a stent to the OM in 96 and subsequent intervention to LAD. 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 He is not having any SSCP, palpitatoins SOB or edema. He has been compliant with his meds. His LDL has been under 100 with normal LFT's He has moderate carotid disease   Reviewed duplex from  04/30/17   and stable  Left ICA  40-59%  Due for repeat 04/2018      Has chronic venous insuf with occasional skin breakdown at ankles  Seen at [facility] 11/19/16 with SVT  K low labs otherwise ok TSH 3.41  Cardioverted to NSR  6 mg adenosine put on Toprol  ROS: Denies fever, malais, weight loss, blurry vision, decreased visual acuity, cough, sputum, SOB, hemoptysis, pleuritic pain, palpitaitons, heartburn, abdominal pain, melena, lower extremity edema, claudication, or rash.  All other systems reviewed and negative  General: Affect appropriate Healthy:  appears stated age 23: normal Neck supple with no adenopathy JVP normal no bruits no thyromegaly Lungs clear with no wheezing and good diaphragmatic motion Heart:  S1/S2 no murmur, no rub, gallop or click PMI normal Abdomen: benighn, BS positve, no tenderness, no AAA no bruit.  No HSM or HJR Distal pulses intact with no bruits Plus 2  Edema bilaterally with medial malleolar erythema Neuro non-focal Skin warm and dry No muscular weakness    Current Outpatient Prescriptions  Medication Sig Dispense Refill  . amLODipine (NORVASC) 10 MG tablet take 1 tablet by mouth once daily 90 tablet 3  . ANORO ELLIPTA 62.5-25 MCG/INH AEPB Inhale 1 puff into the lungs daily.  5  . aspirin 325 MG tablet Take 325 mg by mouth daily.      Marland Kitchen atorvastatin (LIPITOR) 40 MG tablet take 1 tablet by mouth once daily 90  tablet 3  . Blood Glucose Monitoring Suppl (CONTOUR BLOOD GLUCOSE SYSTEM) DEVI Diagnoses - Diabetes E 11.8 Check blood sugar once daily 1 Device 0  . clopidogrel (PLAVIX) 75 MG tablet take 1 tablet by mouth once daily 90 tablet 2  . doxazosin (CARDURA) 4 MG tablet TAKE 1 TABLET BY MOUTH AT BEDTIME 90 tablet 0  . glipiZIDE (GLUCOTROL XL) 5 MG 24 hr tablet Take 1 tablet (5 mg total) by mouth daily with breakfast. Needs A1c 90 tablet 1  . glucose blood (BAYER CONTOUR TEST) test strip Diagnoses - Diabetes E 11.8 Check blood sugar once daily 100 each prn  . hydrochlorothiazide (HYDRODIURIL) 25 MG tablet take 1 tablet by mouth every OTHER DAY 45 tablet 2  . losartan (COZAAR) 100 MG tablet take 1 tablet by mouth once daily 90 tablet 2  . metFORMIN (GLUCOPHAGE-XR) 500 MG 24 hr tablet take 2 tablets by mouth once daily with BREAKFAST 180 tablet 1  . metoprolol succinate (TOPROL-XL) 100 MG 24 hr tablet TAKE 1 TABLET BY MOUTH ONCE DAILY 90 tablet 0  . PROAIR HFA 108 (90 Base) MCG/ACT inhaler INAHLE 2 PUFFS INTO THE LUNGS EVERY 6 HOURS AS NEEDED FOR WHEEZING 8.5 Inhaler 2  . tamsulosin (FLOMAX) 0.4 MG CAPS capsule Take 1 capsule (0.4 mg total) by mouth daily. 90 capsule 3  . traMADol (ULTRAM) 50 MG tablet TAKE 1 TABLET BY  MOUTH TWICE DAILY 30 tablet 0  . triamcinolone ointment (KENALOG) 0.5 % Apply 1 application topically at bedtime. 45 g 0   No current facility-administered medications for this visit.     Allergies  Patient has no known allergies.  Electrocardiogram:  08/05/12  SR rate 51 normal ECG    10/19/14  SR rate 45 mormal 02/24/16  SR rate 96 LVH no acute changes  02/24/16 SR rate 54 RBBB LAFB minimal voltage for LVH 07/23/17 SB rate 53 PR 226 PVC RBBB  Assessment and Plan  CAD: distant stent OM 96 continue medical Rx HTN:  Well controlled.  Continue current medications and low sodium Dash type diet.   Bradycardia:  Had lowered beta blocker and now on Toprol due to SVT HR 60's ok for now Chol:    Cholesterol is at goal.  Continue current dose of statin and diet Rx.  No myalgias or side effects.  F/U  LFT's in 6 months. Lab Results  Component Value Date   LDLCALC 69 09/09/2015  Bruit:  Left ICA 40-59%  stenosis.  No TIA symptoms.  Continue antiplatelet Rx and F/U carotid duplex due August 2019  Edema:  Dependant from chronic venous disease improved with HCTZ SVT:  Infrequent discussed vagal maneuvers. If recurs will refer to EP for consideration of Ablation continue toprol    [employer]

## 2017-07-23 ENCOUNTER — Encounter: Payer: Self-pay | Admitting: Cardiovascular Disease

## 2017-07-23 ENCOUNTER — Ambulatory Visit (INDEPENDENT_AMBULATORY_CARE_PROVIDER_SITE_OTHER): Payer: Medicare Other | Admitting: Cardiovascular Disease

## 2017-07-23 VITALS — BP 140/58 | HR 53 | Ht 70.0 in | Wt 195.4 lb

## 2017-07-23 DIAGNOSIS — I10 Essential (primary) hypertension: Secondary | ICD-10-CM

## 2017-07-23 MED ORDER — ASPIRIN EC 81 MG PO TBEC: 81.0000 mg | DELAYED_RELEASE_TABLET | Freq: Every day | ORAL | Status: DC

## 2017-07-23 NOTE — Patient Instructions (Addendum)
Medication Instructions:  Your physician has recommended you make the following change in your medication:  1-Decrease Aspirin 81 mg by mouth daily.   Labwork: NONE  Testing/Procedures: NONE  Follow-Up: Your physician wants you to follow-up in: 6 months with Dr. Nishan. You will receive a reminder letter in the mail two months in advance. If you don't receive a letter, please call our office to schedule the follow-up appointment.   If you need a refill on your cardiac medications before your next appointment, please call your pharmacy.    

## 2017-09-01 ENCOUNTER — Other Ambulatory Visit: Payer: Self-pay | Admitting: Dermatology

## 2017-09-01 DIAGNOSIS — L720 Epidermal cyst: Secondary | ICD-10-CM | POA: Diagnosis not present

## 2017-09-01 DIAGNOSIS — L723 Sebaceous cyst: Secondary | ICD-10-CM | POA: Diagnosis not present

## 2017-09-01 DIAGNOSIS — L089 Local infection of the skin and subcutaneous tissue, unspecified: Secondary | ICD-10-CM | POA: Diagnosis not present

## 2017-09-21 DIAGNOSIS — E119 Type 2 diabetes mellitus without complications: Secondary | ICD-10-CM | POA: Diagnosis not present

## 2017-09-21 DIAGNOSIS — I252 Old myocardial infarction: Secondary | ICD-10-CM | POA: Diagnosis not present

## 2017-09-21 DIAGNOSIS — R002 Palpitations: Secondary | ICD-10-CM | POA: Diagnosis not present

## 2017-09-21 DIAGNOSIS — Z7982 Long term (current) use of aspirin: Secondary | ICD-10-CM | POA: Diagnosis not present

## 2017-09-21 DIAGNOSIS — R42 Dizziness and giddiness: Secondary | ICD-10-CM | POA: Diagnosis not present

## 2017-09-21 DIAGNOSIS — J449 Chronic obstructive pulmonary disease, unspecified: Secondary | ICD-10-CM | POA: Diagnosis not present

## 2017-09-21 DIAGNOSIS — I4510 Unspecified right bundle-branch block: Secondary | ICD-10-CM | POA: Diagnosis not present

## 2017-09-21 DIAGNOSIS — R000 Tachycardia, unspecified: Secondary | ICD-10-CM | POA: Diagnosis not present

## 2017-09-21 DIAGNOSIS — I10 Essential (primary) hypertension: Secondary | ICD-10-CM | POA: Diagnosis not present

## 2017-09-21 DIAGNOSIS — Z79899 Other long term (current) drug therapy: Secondary | ICD-10-CM | POA: Diagnosis not present

## 2017-09-21 DIAGNOSIS — Z7902 Long term (current) use of antithrombotics/antiplatelets: Secondary | ICD-10-CM | POA: Diagnosis not present

## 2017-09-21 DIAGNOSIS — I471 Supraventricular tachycardia: Secondary | ICD-10-CM | POA: Diagnosis not present

## 2017-09-21 DIAGNOSIS — Z7984 Long term (current) use of oral hypoglycemic drugs: Secondary | ICD-10-CM | POA: Diagnosis not present

## 2017-09-22 ENCOUNTER — Telehealth: Payer: Self-pay | Admitting: Cardiovascular Disease

## 2017-09-22 NOTE — Telephone Encounter (Signed)
New message      STAT if HR is under 50 or over 120 (normal HR is 60-100 beats per minute)  1) What is your heart rate?  [id number] went to hospital   2) Do you have a log of your heart rate readings (document readings)?  no  3) Do you have any other symptoms? No other symptoms, went to hospital and they gave him meds and brought it back down and said to contact you

## 2017-09-22 NOTE — Telephone Encounter (Signed)
Follow up     Patient states that the ER doctor at [facility] said that the patient may want to have an ablation done?

## 2017-09-23 NOTE — Telephone Encounter (Signed)
Called patient back about his message. Patient stated he is feeling fine now. Patient stated the ER gave him metoprolol IV and his heart rate came down. In reviewing patient's labs from ER, Patient's Mg was low at 1.4  And  K was 3.5  on 09/21/17. Patient has a f/u appt with Dr. Johnsie Cancel on 10/22/17. Encouraged patient to keep taking his metoprolol and keep his f/u appt. Will forward message to Dr. Johnsie Cancel for further advisement.

## 2017-09-24 ENCOUNTER — Other Ambulatory Visit: Payer: Self-pay | Admitting: Family Medicine

## 2017-10-01 ENCOUNTER — Encounter: Payer: Self-pay | Admitting: Family Medicine

## 2017-10-01 ENCOUNTER — Ambulatory Visit (INDEPENDENT_AMBULATORY_CARE_PROVIDER_SITE_OTHER): Payer: Medicare Other | Admitting: Family Medicine

## 2017-10-01 VITALS — BP 120/64 | HR 55 | Ht 70.0 in | Wt 196.0 lb

## 2017-10-01 DIAGNOSIS — R790 Abnormal level of blood mineral: Secondary | ICD-10-CM | POA: Diagnosis not present

## 2017-10-01 DIAGNOSIS — D649 Anemia, unspecified: Secondary | ICD-10-CM | POA: Diagnosis not present

## 2017-10-01 DIAGNOSIS — E119 Type 2 diabetes mellitus without complications: Secondary | ICD-10-CM | POA: Diagnosis not present

## 2017-10-01 DIAGNOSIS — E876 Hypokalemia: Secondary | ICD-10-CM | POA: Diagnosis not present

## 2017-10-01 DIAGNOSIS — I471 Supraventricular tachycardia: Secondary | ICD-10-CM

## 2017-10-01 LAB — POCT GLYCOSYLATED HEMOGLOBIN (HGB A1C): Hemoglobin A1C: 7

## 2017-10-01 NOTE — Progress Notes (Signed)
Subjective:    Patient ID: [name] [name], male    DOB: 19-Nov-1940, 77 y.o.   MRN: [id number]  HPI 77 year old male is here today to follow-up for palpitations.  He recently went to the emergency department at [facility] on September 21, 2017.  He was seen by Dr. Emeline General in the emergency department and released home the same day.  He had a fast heart rate for about an hour before deciding to go to the emergency department.  He had a similar episode earlier this year.  He does take metoprolol regularly.  Globin was a little on the low side at 12.7.  Potassium was slightly low at 3.5.  Diagnosed with supraventricular tachycardia.  And 6 mg of adenosine which seemed to resolve his symptoms.  He was told to follow-up with Dr. Jenkins Rouge.  He has done well since he has been home.  He has not had any recurrent episodes.  He is taking his medication regularly.  He does have a scheduled follow-up with his cardiologist.  Diabetes - no hypoglycemic events. No wounds or sores that are not healing well. No increased thirst or urination. Checking glucose at home. Taking medications as prescribed without any side effects.  COPD-he did have some questions about his inhalers.  He went to fill his rescue inhaler and it was $75 but says the pharmacist did mention a deductible.  Review of Systems  BP 120/64   Pulse (!) 55   Ht 5\' 10"  (1.778 m)   Wt 196 lb (88.9 kg)   SpO2 99%   BMI 28.12 kg/m     No Known Allergies  Past Medical History:  Diagnosis Date  . Allergic rhinitis   . Bigeminy   . Bradycardia   . Bruit   . CAD (coronary artery disease)   . Claudication ([place])   . Diabetes mellitus type II   . Dyspnea   . History of prostate cancer    Dr. Gaynelle Arabian  . Hypercholesteremia    II A  . Hypertension   . Malignant neoplasm of prostate ([place])   . Microalbuminuria   . Seborrheic keratosis     Past Surgical History:  Procedure Laterality Date  . CORONARY ANGIOPLASTY WITH STENT  PLACEMENT      Social History   Socioeconomic History  . Marital status: Married    Spouse name: Not on file  . Number of children: Not on file  . Years of education: Not on file  . Highest education level: Not on file  Social Needs  . Financial resource strain: Not on file  . Food insecurity - worry: Not on file  . Food insecurity - inability: Not on file  . Transportation needs - medical: Not on file  . Transportation needs - non-medical: Not on file  Occupational History  . Occupation: Retired but still works Dentist: RETIRED  Tobacco Use  . Smoking status: Former Smoker    Packs/day: 1.00    Years: 20.00    Pack years: 20.00    Last attempt to quit: 04/22/2003    Years since quitting: 14.4  . Smokeless tobacco: Never Used  Substance and Sexual Activity  . Alcohol use: Yes    Alcohol/week: 0.5 oz    Types: 1 Standard drinks or equivalent per week  . Drug use: Not on file  . Sexual activity: Not on file  Other Topics Concern  . Not on file  Social History Narrative  Widowed 08/2009   Has 4 kids, 2 are local   Fair diet   No regular exercise    Family History  Problem Relation Age of Onset  . Heart attack Father   . Coronary artery disease Brother     Outpatient Encounter Medications as of 10/01/2017  Medication Sig  . amLODipine (NORVASC) 10 MG tablet take 1 tablet by mouth once daily  . ANORO ELLIPTA 62.5-25 MCG/INH AEPB Inhale 1 puff into the lungs daily.  Marland Kitchen aspirin EC 81 MG tablet Take 1 tablet (81 mg total) by mouth daily.  Marland Kitchen atorvastatin (LIPITOR) 40 MG tablet take 1 tablet by mouth once daily  . Blood Glucose Monitoring Suppl (CONTOUR BLOOD GLUCOSE SYSTEM) DEVI Diagnoses - Diabetes E 11.8 Check blood sugar once daily  . clopidogrel (PLAVIX) 75 MG tablet take 1 tablet by mouth once daily  . doxazosin (CARDURA) 4 MG tablet TAKE 1 TABLET BY MOUTH AT BEDTIME  . glipiZIDE (GLUCOTROL XL) 5 MG 24 hr tablet Take 1 tablet (5 mg total) by mouth  daily with breakfast. Needs A1c  . glucose blood (BAYER CONTOUR TEST) test strip Diagnoses - Diabetes E 11.8 Check blood sugar once daily  . hydrochlorothiazide (HYDRODIURIL) 25 MG tablet take 1 tablet by mouth every OTHER DAY  . losartan (COZAAR) 100 MG tablet take 1 tablet by mouth once daily  . metFORMIN (GLUCOPHAGE-XR) 500 MG 24 hr tablet take 2 tablets by mouth once daily with BREAKFAST  . metoprolol succinate (TOPROL-XL) 100 MG 24 hr tablet TAKE 1 TABLET BY MOUTH EVERY DAY  . PROAIR HFA 108 (90 Base) MCG/ACT inhaler INHALE 2 PUFFS BY MOUTH INTO THE LUNGS EVERY 6 HOURS AS NEEDED FOR WHEEZING AS DIRECTED  . tamsulosin (FLOMAX) 0.4 MG CAPS capsule Take 1 capsule (0.4 mg total) by mouth daily.  . traMADol (ULTRAM) 50 MG tablet TAKE 1 TABLET BY MOUTH TWICE DAILY  . triamcinolone ointment (KENALOG) 0.5 % Apply 1 application topically at bedtime.   No facility-administered encounter medications on file as of 10/01/2017.           Objective:   Physical Exam  Constitutional: He is oriented to person, place, and time. He appears well-developed and well-nourished.  HENT:  Head: Normocephalic and atraumatic.  Cardiovascular: Normal rate, regular rhythm and normal heart sounds.  No carotid bruits  Pulmonary/Chest: Effort normal and breath sounds normal.  Neurological: He is alert and oriented to person, place, and time.  Skin: Skin is warm and dry.  Psychiatric: He has a normal mood and affect. His behavior is normal.       Assessment & Plan:  SVT -symptomatic currently but this is I believe the third time he has had an episode of SVT.  He does not follow with cardiology to discuss possible ablation in the future.  Also make sure that electrolyte abnormalities have been corrected to reduce potential for him to enter into this arrhythmia.  DM- A1C at 7.0. Improved from last time. Continue to work on health diet and exercise.  F/U in 3 months.   Lab Results  Component Value Date   HGBA1C  7.4 06/16/2017    hypokalemia-due to recheck potassium.  Low magnesium-he did start a multivitamin since his hospitalization.  We will recheck level.

## 2017-10-02 LAB — CBC
HEMATOCRIT: 39.1 % (ref 38.5–50.0)
HEMOGLOBIN: 13 g/dL — AB (ref 13.2–17.1)
MCH: 27.4 pg (ref 27.0–33.0)
MCHC: 33.2 g/dL (ref 32.0–36.0)
MCV: 82.3 fL (ref 80.0–100.0)
MPV: 10.7 fL (ref 7.5–12.5)
Platelets: 213 10*3/uL (ref 140–400)
RBC: 4.75 10*6/uL (ref 4.20–5.80)
RDW: 13.7 % (ref 11.0–15.0)
WBC: 8.6 10*3/uL (ref 3.8–10.8)

## 2017-10-02 LAB — BASIC METABOLIC PANEL WITH GFR
BUN / CREAT RATIO: 25 (calc) — AB (ref 6–22)
BUN: 27 mg/dL — AB (ref 7–25)
CO2: 30 mmol/L (ref 20–32)
CREATININE: 1.1 mg/dL (ref 0.70–1.18)
Calcium: 9.4 mg/dL (ref 8.6–10.3)
Chloride: 107 mmol/L (ref 98–110)
GFR, EST AFRICAN AMERICAN: 75 mL/min/{1.73_m2} (ref >=60)
GFR, EST NON AFRICAN AMERICAN: 65 mL/min/{1.73_m2} (ref >=60)
Glucose, Bld: 83 mg/dL (ref 65–99)
Potassium: 4 mmol/L (ref 3.5–5.3)
Sodium: 143 mmol/L (ref 135–146)

## 2017-10-02 LAB — MAGNESIUM: Magnesium: 2 mg/dL (ref 1.5–2.5)

## 2017-10-02 LAB — FERRITIN: Ferritin: 95 ng/mL (ref 20–380)

## 2017-10-13 ENCOUNTER — Other Ambulatory Visit: Payer: Self-pay | Admitting: Family Medicine

## 2017-10-20 NOTE — Progress Notes (Signed)
Patient ID: YUNIS VOORHEIS, male   DOB: 1941-01-19, 77 y.o.   MRN: [id number]   Dream is seen today in F/U for CAD, HTN and elevated chol. Still selling rope. I believe he's had a stent to the OM in 96 and subsequent intervention to LAD. 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 He is not having any SSCP, palpitatoins SOB or edema. He has been compliant with his meds. His LDL has been under 100 with normal LFT's He has moderate carotid disease   Reviewed duplex from  04/30/17   and stable  Left ICA  40-59%  Due for repeat 04/2018      Has chronic venous insuf with occasional skin breakdown at ankles  Seen at [facility] 11/19/16 with SVT  K low labs otherwise ok TSH 3.41  Cardioverted to NSR  6 mg adenosine put on Toprol Seen again for SVT [facility] 09/21/17 ECG with SVT RBBB rate 172   Discussed EP referral but he only gets one episode / year and he defers for now Has venous disease especially in LLL. Some skin discoloration. No frank ulcer  ROS: Denies fever, malais, weight loss, blurry vision, decreased visual acuity, cough, sputum, SOB, hemoptysis, pleuritic pain, palpitaitons, heartburn, abdominal pain, melena, lower extremity edema, claudication, or rash.  All other systems reviewed and negative  General: BP (!) 152/48   Pulse (!) 54   Ht 5\' 10"  (1.778 m)   Wt 191 lb (86.6 kg)   BMI 27.41 kg/m  Affect appropriate Healthy:  appears stated age 63: normal Neck supple with no adenopathy JVP normal no bruits no thyromegaly Lungs clear with no wheezing and good diaphragmatic motion Heart:  S1/S2 no murmur, no rub, gallop or click PMI normal Abdomen: benighn, BS positve, no tenderness, no AAA no bruit.  No HSM or HJR Distal pulses intact with no bruits LE edema with mild erythema inside left leg  Neuro non-focal Skin warm and dry No muscular weakness     Current Outpatient Medications  Medication Sig Dispense Refill  . amLODipine (NORVASC) 10 MG tablet take 1  tablet by mouth once daily 90 tablet 3  . ANORO ELLIPTA 62.5-25 MCG/INH AEPB Inhale 1 puff into the lungs daily.  5  . aspirin EC 81 MG tablet Take 1 tablet (81 mg total) by mouth daily.    Marland Kitchen atorvastatin (LIPITOR) 40 MG tablet take 1 tablet by mouth once daily 90 tablet 3  . Blood Glucose Monitoring Suppl (CONTOUR BLOOD GLUCOSE SYSTEM) DEVI Diagnoses - Diabetes E 11.8 Check blood sugar once daily 1 Device 0  . clopidogrel (PLAVIX) 75 MG tablet take 1 tablet by mouth once daily 90 tablet 2  . doxazosin (CARDURA) 4 MG tablet TAKE 1 TABLET BY MOUTH AT BEDTIME 90 tablet 1  . glipiZIDE (GLUCOTROL XL) 5 MG 24 hr tablet Take 1 tablet (5 mg total) by mouth daily with breakfast. Needs A1c 90 tablet 1  . glucose blood (BAYER CONTOUR TEST) test strip Diagnoses - Diabetes E 11.8 Check blood sugar once daily 100 each prn  . hydrochlorothiazide (HYDRODIURIL) 25 MG tablet take 1 tablet by mouth every OTHER DAY 45 tablet 2  . losartan (COZAAR) 100 MG tablet take 1 tablet by mouth once daily 90 tablet 2  . metFORMIN (GLUCOPHAGE-XR) 500 MG 24 hr tablet take 2 tablets by mouth once daily with BREAKFAST 180 tablet 1  . metoprolol succinate (TOPROL-XL) 100 MG 24 hr tablet TAKE 1 TABLET BY MOUTH EVERY  DAY 90 tablet 0  . PROAIR HFA 108 (90 Base) MCG/ACT inhaler INHALE 2 PUFFS BY MOUTH INTO THE LUNGS EVERY 6 HOURS AS NEEDED FOR WHEEZING AS DIRECTED 8.5 g 2  . tamsulosin (FLOMAX) 0.4 MG CAPS capsule Take 1 capsule (0.4 mg total) by mouth daily. 90 capsule 3  . traMADol (ULTRAM) 50 MG tablet TAKE 1 TABLET BY MOUTH TWICE DAILY 30 tablet 0  . triamcinolone ointment (KENALOG) 0.5 % Apply 1 application topically at bedtime. 45 g 0   No current facility-administered medications for this visit.     Allergies  Patient has no known allergies.  Electrocardiogram:  08/05/12  SR rate 51 normal ECG    10/19/14  SR rate 45 mormal 02/24/16  SR rate 96 LVH no acute changes  02/24/16 SR rate 54 RBBB LAFB minimal voltage for  LVH 07/23/17 SB rate 53 PR 226 PVC RBBB  Assessment and Plan  CAD: distant stent OM 96 continue medical Rx HTN:  Well controlled.  Continue current medications and low sodium Dash type diet.   Bradycardia:  Had lowered beta blocker and now on Toprol due to SVT HR 60's ok for now Chol:   Cholesterol is at goal.  Continue current dose of statin and diet Rx.  No myalgias or side effects.  F/U  LFT's in 6 months. Lab Results  Component Value Date   LDLCALC 69 09/09/2015  Bruit:  Left ICA 40-59%  stenosis.  No TIA symptoms.  Continue antiplatelet Rx and F/U carotid duplex due August 2019  Edema:  Dependant from chronic venous disease improved with HCTZ refer to [facility] To see if he has reflux that would be amenable to ablative RX  SVT: on beta blocker only one / episode per year defer EP evaluation until episodes more frequent Per patient request    Jenkins Rouge

## 2017-10-22 ENCOUNTER — Ambulatory Visit: Payer: Medicare Other | Admitting: Cardiovascular Disease

## 2017-10-22 ENCOUNTER — Encounter: Payer: Self-pay | Admitting: Cardiovascular Disease

## 2017-10-22 VITALS — BP 152/48 | HR 54 | Ht 70.0 in | Wt 191.0 lb

## 2017-10-22 DIAGNOSIS — E785 Hyperlipidemia, unspecified: Secondary | ICD-10-CM | POA: Diagnosis not present

## 2017-10-22 DIAGNOSIS — I10 Essential (primary) hypertension: Secondary | ICD-10-CM

## 2017-10-22 DIAGNOSIS — I2510 Atherosclerotic heart disease of native coronary artery without angina pectoris: Secondary | ICD-10-CM | POA: Diagnosis not present

## 2017-10-22 DIAGNOSIS — I6523 Occlusion and stenosis of bilateral carotid arteries: Secondary | ICD-10-CM

## 2017-10-22 NOTE — Patient Instructions (Addendum)
Medication Instructions:  Your physician recommends that you continue on your current medications as directed. Please refer to the Current Medication list given to you today.  Labwork: NONE  Testing/Procedures: Your physician has requested that you have a carotid duplex in August. This test is an ultrasound of the carotid arteries in your neck. It looks at blood flow through these arteries that supply the brain with blood. Allow one hour for this exam. There are no restrictions or special instructions.  Follow-Up: Your physician wants you to follow-up in: 12 months with Dr. Johnsie Cancel. You will receive a reminder letter in the mail two months in advance. If you don't receive a letter, please call our office to schedule the follow-up appointment.   If you need a refill on your cardiac medications before your next appointment, please call your pharmacy.  [facility] [phone]

## 2017-11-11 ENCOUNTER — Ambulatory Visit (INDEPENDENT_AMBULATORY_CARE_PROVIDER_SITE_OTHER): Payer: Medicare Other | Admitting: Family Medicine

## 2017-11-11 ENCOUNTER — Ambulatory Visit (INDEPENDENT_AMBULATORY_CARE_PROVIDER_SITE_OTHER): Payer: Medicare Other

## 2017-11-11 ENCOUNTER — Encounter: Payer: Self-pay | Admitting: Family Medicine

## 2017-11-11 VITALS — BP 158/64 | HR 54 | Ht 70.0 in | Wt 195.0 lb

## 2017-11-11 DIAGNOSIS — M79642 Pain in left hand: Secondary | ICD-10-CM

## 2017-11-11 DIAGNOSIS — I878 Other specified disorders of veins: Secondary | ICD-10-CM | POA: Diagnosis not present

## 2017-11-11 DIAGNOSIS — M25542 Pain in joints of left hand: Secondary | ICD-10-CM | POA: Diagnosis not present

## 2017-11-11 DIAGNOSIS — L821 Other seborrheic keratosis: Secondary | ICD-10-CM | POA: Diagnosis not present

## 2017-11-11 NOTE — Progress Notes (Signed)
Subjective:    Patient ID: [name] [name], male    DOB: [birth year], 77 y.o.   MRN: [id number]  HPI   Chronic venous stasis - Dr. Johnsie Cancel had recommended a vascular consult to see if he might be a candidate for vein ablation for possible reflux.  He still is having problems with chronic swelling and occ erythema.  . He doesn't wear compression stockings. He uses the steroid cream and it does help the apperance of his skin.    Also c/o of left hand plan pain particularly at th [facility] joint of the middle finger. C/O stiffness and pain.  He is left handed and plays guitar and this can really interfere with his playing.    He also has several small seborrheic keratoses on his face he would like frozen today.    Review of Systems  BP (!) 158/64   Pulse (!) 54   Ht 5\' 10"  (1.778 m)   Wt 195 lb (88.5 kg)   SpO2 99%   BMI 27.98 kg/m     No Known Allergies  Past Medical History:  Diagnosis Date  . Allergic rhinitis   . Bigeminy   . Bradycardia   . Bruit   . CAD (coronary artery disease)   . Claudication ([place])   . Diabetes mellitus type II   . Dyspnea   . History of prostate cancer    Dr. Gaynelle Arabian  . Hypercholesteremia    II A  . Hypertension   . Malignant neoplasm of prostate ([place])   . Microalbuminuria   . Seborrheic keratosis     Past Surgical History:  Procedure Laterality Date  . CORONARY ANGIOPLASTY WITH STENT PLACEMENT      Social History   Socioeconomic History  . Marital status: Married    Spouse name: Not on file  . Number of children: Not on file  . Years of education: Not on file  . Highest education level: Not on file  Social Needs  . Financial resource strain: Not on file  . Food insecurity - worry: Not on file  . Food insecurity - inability: Not on file  . Transportation needs - medical: Not on file  . Transportation needs - non-medical: Not on file  Occupational History  . Occupation: Retired but still works Dentist: RETIRED   Tobacco Use  . Smoking status: Former Smoker    Packs/day: 1.00    Years: 20.00    Pack years: 20.00    Last attempt to quit: 04/22/2003    Years since quitting: 14.5  . Smokeless tobacco: Never Used  Substance and Sexual Activity  . Alcohol use: Yes    Alcohol/week: 0.5 oz    Types: 1 Standard drinks or equivalent per week  . Drug use: Not on file  . Sexual activity: Not on file  Other Topics Concern  . Not on file  Social History Narrative   Widowed 08/2009   Has 4 kids, 2 are local   Fair diet   No regular exercise    Family History  Problem Relation Age of Onset  . Heart attack Father   . Coronary artery disease Brother     Outpatient Encounter Medications as of 11/11/2017  Medication Sig  . amLODipine (NORVASC) 10 MG tablet take 1 tablet by mouth once daily  . ANORO ELLIPTA 62.5-25 MCG/INH AEPB Inhale 1 puff into the lungs daily.  Marland Kitchen aspirin EC 81 MG tablet Take 1 tablet (81 mg  total) by mouth daily.  Marland Kitchen atorvastatin (LIPITOR) 40 MG tablet take 1 tablet by mouth once daily  . Blood Glucose Monitoring Suppl (CONTOUR BLOOD GLUCOSE SYSTEM) DEVI Diagnoses - Diabetes E 11.8 Check blood sugar once daily  . clopidogrel (PLAVIX) 75 MG tablet take 1 tablet by mouth once daily  . doxazosin (CARDURA) 4 MG tablet TAKE 1 TABLET BY MOUTH AT BEDTIME  . glipiZIDE (GLUCOTROL XL) 5 MG 24 hr tablet Take 1 tablet (5 mg total) by mouth daily with breakfast. Needs A1c  . glucose blood (BAYER CONTOUR TEST) test strip Diagnoses - Diabetes E 11.8 Check blood sugar once daily  . hydrochlorothiazide (HYDRODIURIL) 25 MG tablet take 1 tablet by mouth every OTHER DAY  . losartan (COZAAR) 100 MG tablet take 1 tablet by mouth once daily  . metFORMIN (GLUCOPHAGE-XR) 500 MG 24 hr tablet take 2 tablets by mouth once daily with BREAKFAST  . metoprolol succinate (TOPROL-XL) 100 MG 24 hr tablet TAKE 1 TABLET BY MOUTH EVERY DAY  . PROAIR HFA 108 (90 Base) MCG/ACT inhaler INHALE 2 PUFFS BY MOUTH INTO THE LUNGS  EVERY 6 HOURS AS NEEDED FOR WHEEZING AS DIRECTED  . tamsulosin (FLOMAX) 0.4 MG CAPS capsule Take 1 capsule (0.4 mg total) by mouth daily.  . traMADol (ULTRAM) 50 MG tablet TAKE 1 TABLET BY MOUTH TWICE DAILY  . triamcinolone ointment (KENALOG) 0.5 % Apply 1 application topically at bedtime.   No facility-administered encounter medications on file as of 11/11/2017.          Objective:   Physical Exam  Constitutional: He is oriented to person, place, and time. He appears well-developed and well-nourished.  HENT:  Head: Normocephalic and atraumatic.  Eyes: Conjunctivae and EOM are normal.  Cardiovascular: Normal rate.  Pulmonary/Chest: Effort normal.  Musculoskeletal:  The middle finger MCP is larger and thicker than opposite hand.  No significant edema.  Likely OA.    Neurological: He is alert and oriented to person, place, and time.  Skin: Skin is dry. No pallor.  Skin on lower legs look thickened and pink. Some hair loss on the lower legs.  No open wounds.   Psychiatric: He has a normal mood and affect. His behavior is normal.  Vitals reviewed.      Assessment & Plan:    Chronic venous stasis disease-improved with hydrochlorthiazide.  Evaluation by vein and vascular recommended for  possible consideration of ablative treatment for vein reflux. I think worth consultation.  He will call office in [place].   Left hand pain - will get x-rays and if ready for injection can folllow back up with Dr. Darene Lamer who he saw for this in 2016.    Seborrheic keratoses - cyrotherapy performed. Tolerated well.  Follow-up wound care discussed.  Cryotherapy Procedure Note  Pre-operative Diagnosis: Seborrheic keratoses  Post-operative Diagnosis: same  Locations: bilateral face   Indications: irritation  Anesthesia: not required    Procedure Details  Patient informed of risks (permanent scarring, infection, light or dark discoloration, bleeding, infection, weakness, numbness and recurrence of the  lesion) and benefits of the procedure and verbal informed consent obtained.  The areas are treated with liquid nitrogen therapy, frozen until ice ball extended 1-2 mm beyond lesion, allowed to thaw, and treated again. The patient tolerated procedure well.  The patient was instructed on post-op care, warned that there may be blister formation, redness and pain. Recommend OTC analgesia as needed for pain.  Condition: Stable  Complications: none.  Plan: 1. Instructed to  keep the area dry and covered for 24-48h and clean thereafter. 2. Warning signs of infection were reviewed.   3. Recommended that the patient use OTC acetaminophen as needed for pain.  4. Return PRN.

## 2017-11-17 ENCOUNTER — Other Ambulatory Visit: Payer: Self-pay | Admitting: Family Medicine

## 2017-11-18 DIAGNOSIS — I8311 Varicose veins of right lower extremity with inflammation: Secondary | ICD-10-CM | POA: Diagnosis not present

## 2017-11-18 DIAGNOSIS — I83893 Varicose veins of bilateral lower extremities with other complications: Secondary | ICD-10-CM | POA: Diagnosis not present

## 2017-11-18 DIAGNOSIS — I8312 Varicose veins of left lower extremity with inflammation: Secondary | ICD-10-CM | POA: Diagnosis not present

## 2017-11-22 ENCOUNTER — Other Ambulatory Visit: Payer: Self-pay | Admitting: Family Medicine

## 2017-11-26 DIAGNOSIS — I8311 Varicose veins of right lower extremity with inflammation: Secondary | ICD-10-CM | POA: Diagnosis not present

## 2017-11-26 DIAGNOSIS — I8312 Varicose veins of left lower extremity with inflammation: Secondary | ICD-10-CM | POA: Diagnosis not present

## 2017-11-30 ENCOUNTER — Telehealth: Payer: Self-pay | Admitting: Family Medicine

## 2017-11-30 NOTE — Telephone Encounter (Signed)
Received an approval from [facility] that a 7 day supply was approved from [employer]. Pharmacy notified and form sent to scan. Patient will need a new Rx for a 30 day supply after the 7 day prescription.

## 2017-12-09 ENCOUNTER — Other Ambulatory Visit: Payer: Self-pay | Admitting: Family Medicine

## 2017-12-20 ENCOUNTER — Other Ambulatory Visit: Payer: Self-pay | Admitting: Family Medicine

## 2017-12-20 ENCOUNTER — Other Ambulatory Visit: Payer: Self-pay | Admitting: Cardiovascular Disease

## 2017-12-20 DIAGNOSIS — I8311 Varicose veins of right lower extremity with inflammation: Secondary | ICD-10-CM | POA: Diagnosis not present

## 2017-12-20 DIAGNOSIS — I8312 Varicose veins of left lower extremity with inflammation: Secondary | ICD-10-CM | POA: Diagnosis not present

## 2018-01-14 DIAGNOSIS — Z961 Presence of intraocular lens: Secondary | ICD-10-CM | POA: Diagnosis not present

## 2018-01-14 DIAGNOSIS — E119 Type 2 diabetes mellitus without complications: Secondary | ICD-10-CM | POA: Diagnosis not present

## 2018-01-14 DIAGNOSIS — H1045 Other chronic allergic conjunctivitis: Secondary | ICD-10-CM | POA: Diagnosis not present

## 2018-01-14 LAB — HM DIABETES EYE EXAM

## 2018-01-24 ENCOUNTER — Other Ambulatory Visit: Payer: Self-pay | Admitting: Family Medicine

## 2018-01-24 ENCOUNTER — Other Ambulatory Visit: Payer: Self-pay

## 2018-01-24 MED ORDER — ATORVASTATIN CALCIUM 40 MG PO TABS: 40.0000 mg | ORAL_TABLET | Freq: Every day | ORAL | 0 refills | Status: DC

## 2018-01-25 ENCOUNTER — Encounter: Payer: Self-pay | Admitting: Family Medicine

## 2018-02-21 ENCOUNTER — Ambulatory Visit (INDEPENDENT_AMBULATORY_CARE_PROVIDER_SITE_OTHER): Payer: Medicare Other | Admitting: Family Medicine

## 2018-02-21 ENCOUNTER — Encounter: Payer: Self-pay | Admitting: Family Medicine

## 2018-02-21 VITALS — BP 135/58 | HR 55 | Ht 70.0 in | Wt 193.0 lb

## 2018-02-21 DIAGNOSIS — I10 Essential (primary) hypertension: Secondary | ICD-10-CM | POA: Diagnosis not present

## 2018-02-21 DIAGNOSIS — E1100 Type 2 diabetes mellitus with hyperosmolarity without nonketotic hyperglycemic-hyperosmolar coma (NKHHC): Secondary | ICD-10-CM

## 2018-02-21 DIAGNOSIS — J449 Chronic obstructive pulmonary disease, unspecified: Secondary | ICD-10-CM | POA: Diagnosis not present

## 2018-02-21 LAB — POCT GLYCOSYLATED HEMOGLOBIN (HGB A1C): Hemoglobin A1C: 6.3 % — AB (ref 4.0–5.6)

## 2018-02-21 NOTE — Progress Notes (Signed)
Subjective:    CC: DM, HTN  HPI:  Diabetes - no hypoglycemic events. No wounds or sores that are not healing well. No increased thirst or urination. Checking glucose at home. Taking medications as prescribed without any side effects.  Hypertension- Pt denies chest pain, SOB, dizziness, or heart palpitations.  Taking meds as directed w/o problems.  Denies medication side effects.    F/U COPD - she is currently on Anoro. He is worried he is going to hit his Medicare gap soon and will not be able to afford it.  If he ends up paying his Medicare gap and encouraged him just to call the office and we will try to get him some samples.  And at that that will not get him enough medicine for the rest of the year but it might get him through for couple months.  Uses Proair PRN.  He does report that he has been coughing up some more phlegm recently.  He has had a little bit of a chronic cough but says he is been noticing a little bit more phlegm production but is not every day it does not seem to be consistent he has also had a little bit more nasal congestion and drainage and watery itchy eyes that he recently started some over-the-counter eyedrops for itching and says they have been fantastic and have been working really well to control his itchy eyes.  Past medical history, Surgical history, Family history not pertinant except as noted below, Social history, Allergies, and medications have been entered into the medical record, reviewed, and corrections made.   Review of Systems: No fevers, chills, night sweats, weight loss, chest pain, or shortness of breath.   Objective:    General: Well Developed, well nourished, and in no acute distress.  Neuro: Alert and oriented x3, extra-ocular muscles intact, sensation grossly intact.  HEENT: Normocephalic, atraumatic  Skin: Warm and dry, no rashes. Cardiac: Regular rate and rhythm, no murmurs rubs or gallops, no lower extremity edema.  Respiratory: Clear to  auscultation bilaterally. Not using accessory muscles, speaking in full sentences.   Impression and Recommendations:    DM - A1C of 6.3.  Well controlled. Continue current regimen. Follow up in  4 months.    HTN - Well controlled. Continue current regimen. Follow up in  4 months.   COPD - Well controlled. Continue current regimen. Follow up in   4 months.

## 2018-02-24 ENCOUNTER — Other Ambulatory Visit: Payer: Self-pay | Admitting: Family Medicine

## 2018-02-24 NOTE — Telephone Encounter (Signed)
Routed to pcp for signature.Dawood Spitler Lynetta, CMA  

## 2018-02-25 ENCOUNTER — Encounter: Payer: Self-pay | Admitting: Family Medicine

## 2018-02-25 ENCOUNTER — Ambulatory Visit: Payer: Medicare Other

## 2018-02-25 ENCOUNTER — Ambulatory Visit (INDEPENDENT_AMBULATORY_CARE_PROVIDER_SITE_OTHER): Payer: Medicare Other | Admitting: Family Medicine

## 2018-02-25 VITALS — BP 140/64 | HR 58 | Ht 70.0 in | Wt 191.0 lb

## 2018-02-25 DIAGNOSIS — R1011 Right upper quadrant pain: Secondary | ICD-10-CM | POA: Diagnosis not present

## 2018-02-25 DIAGNOSIS — R0789 Other chest pain: Secondary | ICD-10-CM | POA: Diagnosis not present

## 2018-02-25 DIAGNOSIS — R1012 Left upper quadrant pain: Secondary | ICD-10-CM

## 2018-02-25 NOTE — Progress Notes (Signed)
Subjective:    Patient ID: [name] [name], male    DOB: [birth year], 77 y.o.   MRN: [id number]  HPI 77 yo with hx of HTN, CAD and DM  reports that he was working in his yard a few days ago and was moviing some heavy things around he believes that he may have pulled some muscles.  He says he was out pulling some very large heavy branches but did not lift anything and did not fall.  He says that it is an aching sensation across the bilateral upper abdomen radiating around towards his back but no pain directly over his spine.  Pain radiates somewhat up into his chest.  But no nausea or diaphoresis.  When asked about change in bowels he says he always has problems with his bowels so it is nothing new.  He denies any blood in his stool.  He denies any change or increase in shortness of breath.  No cold or respiratory symptoms.  No wheezing.  Denies any actual muscle spasms.  He says it almost feels like when he fractured a rib but knows that he did not because he did not actually have a fall or injury.   Review of Systems  See HPI.     BP 140/64   Pulse (!) 58   Ht 5\' 10"  (1.778 m)   Wt 191 lb (86.6 kg)   SpO2 100%   BMI 27.41 kg/m     No Known Allergies  Past Medical History:  Diagnosis Date  . Allergic rhinitis   . Bigeminy   . Bradycardia   . Bruit   . CAD (coronary artery disease)   . Claudication ([place])   . Diabetes mellitus type II   . Dyspnea   . History of prostate cancer    Dr. Gaynelle Arabian  . Hypercholesteremia    II A  . Hypertension   . Malignant neoplasm of prostate ([place])   . Microalbuminuria   . Seborrheic keratosis     Past Surgical History:  Procedure Laterality Date  . CORONARY ANGIOPLASTY WITH STENT PLACEMENT      Social History   Socioeconomic History  . Marital status: Married    Spouse name: Not on file  . Number of children: Not on file  . Years of education: Not on file  . Highest education level: Not on file  Occupational History  . Occupation:  Retired but still works Dentist: RETIRED  Social Needs  . Financial resource strain: Not on file  . Food insecurity:    Worry: Not on file    Inability: Not on file  . Transportation needs:    Medical: Not on file    Non-medical: Not on file  Tobacco Use  . Smoking status: Former Smoker    Packs/day: 1.00    Years: 20.00    Pack years: 20.00    Last attempt to quit: 04/22/2003    Years since quitting: 14.8  . Smokeless tobacco: Never Used  Substance and Sexual Activity  . Alcohol use: Yes    Alcohol/week: 0.5 oz    Types: 1 Standard drinks or equivalent per week  . Drug use: Not on file  . Sexual activity: Not on file  Lifestyle  . Physical activity:    Days per week: Not on file    Minutes per session: Not on file  . Stress: Not on file  Relationships  . Social connections:    Talks on  phone: Not on file    Gets together: Not on file    Attends religious service: Not on file    Active member of club or organization: Not on file    Attends meetings of clubs or organizations: Not on file    Relationship status: Not on file  . Intimate partner violence:    Fear of current or ex partner: Not on file    Emotionally abused: Not on file    Physically abused: Not on file    Forced sexual activity: Not on file  Other Topics Concern  . Not on file  Social History Narrative   Widowed 08/2009   Has 4 kids, 2 are local   Fair diet   No regular exercise    Family History  Problem Relation Age of Onset  . Heart attack Father   . Coronary artery disease Brother     Outpatient Encounter Medications as of 02/25/2018  Medication Sig  . amLODipine (NORVASC) 10 MG tablet TAKE 1 TABLET BY MOUTH ONCE DAILY  . ANORO ELLIPTA 62.5-25 MCG/INH AEPB INHALE 1 PUFF BY MOUTH EVERY DAY  . aspirin EC 81 MG tablet Take 1 tablet (81 mg total) by mouth daily.  Marland Kitchen atorvastatin (LIPITOR) 40 MG tablet Take 1 tablet (40 mg total) by mouth daily. Must keep upcoming OV  . Blood  Glucose Monitoring Suppl (CONTOUR BLOOD GLUCOSE SYSTEM) DEVI Diagnoses - Diabetes E 11.8 Check blood sugar once daily  . clopidogrel (PLAVIX) 75 MG tablet TAKE 1 TABLET BY MOUTH ONCE DAILY  . doxazosin (CARDURA) 4 MG tablet TAKE 1 TABLET BY MOUTH AT BEDTIME  . glipiZIDE (GLUCOTROL XL) 5 MG 24 hr tablet TAKE 1 TABLET(5 MG) BY MOUTH DAILY WITH BREAKFAST  . glucose blood (BAYER CONTOUR TEST) test strip Diagnoses - Diabetes E 11.8 Check blood sugar once daily  . hydrochlorothiazide (HYDRODIURIL) 25 MG tablet TAKE 1 TABLET BY MOUTH EVERY OTHER DAY  . losartan (COZAAR) 100 MG tablet TAKE 1 TABLET BY MOUTH ONCE DAILY  . metFORMIN (GLUCOPHAGE-XR) 500 MG 24 hr tablet take 2 tablets by mouth once daily with BREAKFAST  . metoprolol succinate (TOPROL-XL) 100 MG 24 hr tablet TAKE 1 TABLET BY MOUTH EVERY DAY  . PROAIR HFA 108 (90 Base) MCG/ACT inhaler INHALE 2 PUFFS BY MOUTH INTO THE LUNGS EVERY 6 HOURS AS NEEDED FOR WHEEZING AS DIRECTED  . tamsulosin (FLOMAX) 0.4 MG CAPS capsule Take 1 capsule (0.4 mg total) by mouth daily.  . traMADol (ULTRAM) 50 MG tablet TAKE 1 TABLET BY MOUTH TWICE DAILY  . triamcinolone ointment (KENALOG) 0.5 % Apply 1 application topically at bedtime.   No facility-administered encounter medications on file as of 02/25/2018.          Objective:   Physical Exam  Constitutional: He is oriented to person, place, and time. He appears well-developed and well-nourished.  HENT:  Head: Normocephalic and atraumatic.  Cardiovascular: Normal rate, regular rhythm and normal heart sounds.  No carotid bruits.  Nontender chest wall.  Pulmonary/Chest: Effort normal and breath sounds normal.  Abdominal: Soft. Bowel sounds are normal. He exhibits no distension and no mass. There is no tenderness. There is no rebound and no guarding. No hernia.  Musculoskeletal: He exhibits no edema.  Nontender over the spine.  Normal lumbar flexion extension rotation right and left and normal side bending.   Neurological: He is alert and oriented to person, place, and time.  Skin: Skin is warm and dry.  Psychiatric: He has a  normal mood and affect. His behavior is normal.  Nursing note and vitals reviewed.         Assessment & Plan:  Upper abdominal/atypical chest pain-likely musculoskeletal strain from pulling on branches.  I palpated the entire chest wall his back and abdomen and was unable to re-create his tenderness but when he would turn or twist he would feel the aching that he was describing.  It is better at rest.  No vomiting or blood in the stool.  EKG today shows rate of 57 bpm with sinus bradycardia and first-degree AV block consistent with previous EKG from November 2018.  We will go ahead and check troponins as he does have a history of coronary artery disease and peripheral vascular disease but this would definitely be an atypical presentation.  We will also check electrolytes liver and kidney function as well as a CBC to evaluate for possible infection.  Recommend a trial of Tylenol OTC over the weekend he [name] takes aspirin daily so I want to avoid NSAIDs if possible.  If he is not improving let me know if he suddenly gets worse develops fever or rash etc. please let us know immediately.

## 2018-02-26 LAB — CBC WITH DIFFERENTIAL/PLATELET
BASOS PCT: 0.6 %
Basophils Absolute: 50 cells/uL (ref 0–200)
EOS PCT: 2.5 %
Eosinophils Absolute: 208 cells/uL (ref 15–500)
HCT: 39.7 % (ref 38.5–50.0)
Hemoglobin: 13.6 g/dL (ref 13.2–17.1)
Lymphs Abs: 2133 cells/uL (ref 850–3900)
MCH: 29.1 pg (ref 27.0–33.0)
MCHC: 34.3 g/dL (ref 32.0–36.0)
MCV: 84.8 fL (ref 80.0–100.0)
MONOS PCT: 6.5 %
MPV: 10.7 fL (ref 7.5–12.5)
Neutro Abs: 5370 cells/uL (ref 1500–7800)
Neutrophils Relative %: 64.7 %
PLATELETS: 162 10*3/uL (ref 140–400)
RBC: 4.68 10*6/uL (ref 4.20–5.80)
RDW: 13.8 % (ref 11.0–15.0)
TOTAL LYMPHOCYTE: 25.7 %
WBC mixed population: 540 cells/uL (ref 200–950)
WBC: 8.3 10*3/uL (ref 3.8–10.8)

## 2018-02-26 LAB — COMPLETE METABOLIC PANEL WITH GFR
AG Ratio: 1.8 (calc) (ref 1.0–2.5)
ALBUMIN MSPROF: 4.4 g/dL (ref 3.6–5.1)
ALKALINE PHOSPHATASE (APISO): 102 U/L (ref 40–115)
ALT: 19 U/L (ref 9–46)
AST: 16 U/L (ref 10–35)
BILIRUBIN TOTAL: 0.6 mg/dL (ref 0.2–1.2)
BUN / CREAT RATIO: 27 (calc) — AB (ref 6–22)
BUN: 39 mg/dL — ABNORMAL HIGH (ref 7–25)
CO2: 25 mmol/L (ref 20–32)
Calcium: 10 mg/dL (ref 8.6–10.3)
Chloride: 107 mmol/L (ref 98–110)
Creat: 1.43 mg/dL — ABNORMAL HIGH (ref 0.70–1.18)
GFR, Est African American: 55 mL/min/{1.73_m2} — ABNORMAL LOW (ref >=60)
GFR, Est Non African American: 47 mL/min/{1.73_m2} — ABNORMAL LOW (ref >=60)
GLOBULIN: 2.5 g/dL (ref 1.9–3.7)
Glucose, Bld: 104 mg/dL (ref 65–139)
Potassium: 4.2 mmol/L (ref 3.5–5.3)
SODIUM: 141 mmol/L (ref 135–146)
Total Protein: 6.9 g/dL (ref 6.1–8.1)

## 2018-02-26 LAB — TROPONIN I: Troponin I: <0.01 ng/mL (ref <=0.0)

## 2018-02-26 LAB — CK: CK TOTAL: 41 U/L — AB (ref 44–196)

## 2018-02-28 ENCOUNTER — Ambulatory Visit (INDEPENDENT_AMBULATORY_CARE_PROVIDER_SITE_OTHER): Payer: Medicare Other

## 2018-02-28 ENCOUNTER — Other Ambulatory Visit: Payer: Self-pay | Admitting: *Deleted

## 2018-02-28 ENCOUNTER — Other Ambulatory Visit: Payer: Self-pay | Admitting: Family Medicine

## 2018-02-28 ENCOUNTER — Telehealth: Payer: Self-pay

## 2018-02-28 DIAGNOSIS — R7989 Other specified abnormal findings of blood chemistry: Secondary | ICD-10-CM

## 2018-02-28 DIAGNOSIS — R0789 Other chest pain: Secondary | ICD-10-CM

## 2018-02-28 DIAGNOSIS — R079 Chest pain, unspecified: Secondary | ICD-10-CM

## 2018-02-28 NOTE — Telephone Encounter (Signed)
OK to get a chest xray. Please order.

## 2018-02-28 NOTE — Telephone Encounter (Signed)
Patient advised and order placed  

## 2018-02-28 NOTE — Telephone Encounter (Signed)
[name] [name] called and states his chest pain is the same. He denies fever or rash. He states the tylenol is not helping at all.

## 2018-03-01 ENCOUNTER — Other Ambulatory Visit: Payer: Self-pay | Admitting: *Deleted

## 2018-03-01 DIAGNOSIS — R1011 Right upper quadrant pain: Secondary | ICD-10-CM

## 2018-03-01 DIAGNOSIS — R1012 Left upper quadrant pain: Principal | ICD-10-CM

## 2018-03-01 LAB — COMPLETE METABOLIC PANEL WITH GFR
AG Ratio: 1.7 (calc) (ref 1.0–2.5)
ALBUMIN MSPROF: 4.1 g/dL (ref 3.6–5.1)
ALT: 13 U/L (ref 9–46)
AST: 14 U/L (ref 10–35)
Alkaline phosphatase (APISO): 89 U/L (ref 40–115)
BILIRUBIN TOTAL: 0.5 mg/dL (ref 0.2–1.2)
BUN / CREAT RATIO: 25 (calc) — AB (ref 6–22)
BUN: 36 mg/dL — AB (ref 7–25)
CALCIUM: 9.8 mg/dL (ref 8.6–10.3)
CO2: 26 mmol/L (ref 20–32)
Chloride: 106 mmol/L (ref 98–110)
Creat: 1.43 mg/dL — ABNORMAL HIGH (ref 0.70–1.18)
GFR, EST AFRICAN AMERICAN: 55 mL/min/{1.73_m2} — AB (ref >=60)
GFR, EST NON AFRICAN AMERICAN: 47 mL/min/{1.73_m2} — AB (ref >=60)
GLUCOSE: 139 mg/dL — AB (ref 65–99)
Globulin: 2.4 g/dL (calc) (ref 1.9–3.7)
Potassium: 4.2 mmol/L (ref 3.5–5.3)
Sodium: 141 mmol/L (ref 135–146)
TOTAL PROTEIN: 6.5 g/dL (ref 6.1–8.1)

## 2018-03-01 NOTE — Progress Notes (Signed)
Pt advised. He does have his gallbladder. Will send back to pcp for advice.Elouise Munroe, [place]

## 2018-03-04 ENCOUNTER — Ambulatory Visit (INDEPENDENT_AMBULATORY_CARE_PROVIDER_SITE_OTHER): Payer: Medicare Other

## 2018-03-04 DIAGNOSIS — K8020 Calculus of gallbladder without cholecystitis without obstruction: Secondary | ICD-10-CM | POA: Diagnosis not present

## 2018-03-04 DIAGNOSIS — K8080 Other cholelithiasis without obstruction: Secondary | ICD-10-CM | POA: Diagnosis not present

## 2018-03-04 DIAGNOSIS — R1011 Right upper quadrant pain: Secondary | ICD-10-CM

## 2018-03-09 DIAGNOSIS — R1011 Right upper quadrant pain: Secondary | ICD-10-CM | POA: Diagnosis not present

## 2018-03-09 DIAGNOSIS — K8020 Calculus of gallbladder without cholecystitis without obstruction: Secondary | ICD-10-CM | POA: Diagnosis not present

## 2018-03-09 DIAGNOSIS — Z86010 Personal history of colonic polyps: Secondary | ICD-10-CM | POA: Diagnosis not present

## 2018-03-10 DIAGNOSIS — R1011 Right upper quadrant pain: Secondary | ICD-10-CM | POA: Diagnosis not present

## 2018-03-10 DIAGNOSIS — K8020 Calculus of gallbladder without cholecystitis without obstruction: Secondary | ICD-10-CM | POA: Diagnosis not present

## 2018-03-16 ENCOUNTER — Other Ambulatory Visit: Payer: Self-pay | Admitting: Family Medicine

## 2018-03-17 ENCOUNTER — Telehealth: Payer: Self-pay

## 2018-03-17 MED ORDER — TRAMADOL HCL 50 MG PO TABS: 100.0000 mg | ORAL_TABLET | Freq: Two times a day (BID) | ORAL | 1 refills | Status: DC

## 2018-03-17 NOTE — Telephone Encounter (Signed)
Pt called upset stating he is in pain and his Tramadol was denied.  Pt wanting to know why his tramadol was denied. If he cannot get this, wants to know what his other alternatives are as far as pain med.  Please advise. Thanks!

## 2018-03-17 NOTE — Telephone Encounter (Signed)
NOt sure what he is talknig about. Have we called the pharmacy?

## 2018-03-17 NOTE — Telephone Encounter (Signed)
Pt states he was instructed to increase Tramadol to 2 tabs BID. I discussed with Dr Madilyn Fireman and updated directions on pended RX.   I called to check with [facility], they state initial fill was 02-24-18 and refilled on 03-09-18. Dr Madilyn Fireman aware

## 2018-03-18 DIAGNOSIS — R109 Unspecified abdominal pain: Secondary | ICD-10-CM | POA: Diagnosis not present

## 2018-03-23 ENCOUNTER — Ambulatory Visit (INDEPENDENT_AMBULATORY_CARE_PROVIDER_SITE_OTHER): Payer: Medicare Other | Admitting: Family Medicine

## 2018-03-23 ENCOUNTER — Ambulatory Visit (INDEPENDENT_AMBULATORY_CARE_PROVIDER_SITE_OTHER): Payer: Medicare Other

## 2018-03-23 ENCOUNTER — Encounter: Payer: Self-pay | Admitting: Family Medicine

## 2018-03-23 VITALS — BP 174/49 | HR 60 | Temp 98.3°F | Ht 70.0 in | Wt 184.0 lb

## 2018-03-23 DIAGNOSIS — M546 Pain in thoracic spine: Secondary | ICD-10-CM

## 2018-03-23 DIAGNOSIS — L570 Actinic keratosis: Secondary | ICD-10-CM | POA: Diagnosis not present

## 2018-03-23 MED ORDER — HYDROCODONE-ACETAMINOPHEN 5-325 MG PO TABS: 1.0000 | ORAL_TABLET | Freq: Four times a day (QID) | ORAL | 0 refills | Status: DC | PRN

## 2018-03-23 MED ORDER — PREDNISONE 20 MG PO TABS: 40.0000 mg | ORAL_TABLET | Freq: Every day | ORAL | 0 refills | Status: DC

## 2018-03-23 NOTE — Progress Notes (Signed)
Subjective:    Patient ID: [name] [name], male    DOB: April 16, 1941, 77 y.o.   MRN: [id number]  HPI 77 yo male comes in today to follow-up on thoracic spine pain.  He had been pulling some big heavy rope and did not feel any pain during the activity but about 2 days later started to get some discomfort.  He is also been pulling on some very heavy branches around that time.  He came in complaining more of anterior chest pain as well as some upper abdominal pain.  He did not complain of any pain over his back or spine at the time.  He says in fact it felt like when he fractured a rib.  We did further work-up and could not find anything specific.  No evidence of cardiac chest pain.  We ended up getting an abdominal ultrasound because he started to complain more of right upper quadrant pain.  It did show some gallstones but HIDA scan was normal and did not reflect acute disease.  He did have a consultation with a surgeon on June 28 he felt that it was more musculoskeletal.  Today he is complaining more of pain that is radiating from his back around to his anterior lower chest bilaterally.  It is worse with certain movements and it is worth was coughing or sneezing.  He says in fact he has a sneeze he has depressed his hand on the front of his chest and on his mid back so that it is not painful enough that it almost brings him to his knees.  He says the tramadol did not improve his pain at all nor did the Tylenol.  He needs something stronger, viewed notes from the surgeon. Says the muscle relaxer given by the surgeon didn't help at all.   Has a skin lesion on the left upper outer ear of the pinna.  It is been crusty and not healing.  Review of Systems  BP (!) 174/49   Pulse 60   Temp 98.3 F (36.8 C)   Ht 5\' 10"  (1.778 m)   Wt 184 lb (83.5 kg)   SpO2 95%   BMI 26.40 kg/m     No Known Allergies  Past Medical History:  Diagnosis Date  . Allergic rhinitis   . Bigeminy   . Bradycardia   . Bruit    . CAD (coronary artery disease)   . Claudication ([place])   . Diabetes mellitus type II   . Dyspnea   . History of prostate cancer    Dr. Gaynelle Arabian  . Hypercholesteremia    II A  . Hypertension   . Malignant neoplasm of prostate ([place])   . Microalbuminuria   . Seborrheic keratosis     Past Surgical History:  Procedure Laterality Date  . CORONARY ANGIOPLASTY WITH STENT PLACEMENT      Social History   Socioeconomic History  . Marital status: Married    Spouse name: Not on file  . Number of children: Not on file  . Years of education: Not on file  . Highest education level: Not on file  Occupational History  . Occupation: Retired but still works Dentist: RETIRED  Social Needs  . Financial resource strain: Not on file  . Food insecurity:    Worry: Not on file    Inability: Not on file  . Transportation needs:    Medical: Not on file    Non-medical: Not on file  Tobacco Use  . Smoking status: Former Smoker    Packs/day: 1.00    Years: 20.00    Pack years: 20.00    Last attempt to quit: 04/22/2003    Years since quitting: 14.9  . Smokeless tobacco: Never Used  Substance and Sexual Activity  . Alcohol use: Yes    Alcohol/week: 0.6 oz    Types: 1 Standard drinks or equivalent per week  . Drug use: Not on file  . Sexual activity: Not on file  Lifestyle  . Physical activity:    Days per week: Not on file    Minutes per session: Not on file  . Stress: Not on file  Relationships  . Social connections:    Talks on phone: Not on file    Gets together: Not on file    Attends religious service: Not on file    Active member of club or organization: Not on file    Attends meetings of clubs or organizations: Not on file    Relationship status: Not on file  . Intimate partner violence:    Fear of current or ex partner: Not on file    Emotionally abused: Not on file    Physically abused: Not on file    Forced sexual activity: Not on file  Other Topics  Concern  . Not on file  Social History Narrative   Widowed 08/2009   Has 4 kids, 2 are local   Fair diet   No regular exercise    Family History  Problem Relation Age of Onset  . Heart attack Father   . Coronary artery disease Brother     Outpatient Encounter Medications as of 03/23/2018  Medication Sig  . amLODipine (NORVASC) 10 MG tablet TAKE 1 TABLET BY MOUTH ONCE DAILY  . ANORO ELLIPTA 62.5-25 MCG/INH AEPB INHALE 1 PUFF BY MOUTH EVERY DAY  . aspirin EC 81 MG tablet Take 1 tablet (81 mg total) by mouth daily.  Marland Kitchen atorvastatin (LIPITOR) 40 MG tablet Take 1 tablet (40 mg total) by mouth daily. Must keep upcoming OV  . Blood Glucose Monitoring Suppl (CONTOUR BLOOD GLUCOSE SYSTEM) DEVI Diagnoses - Diabetes E 11.8 Check blood sugar once daily  . clopidogrel (PLAVIX) 75 MG tablet TAKE 1 TABLET BY MOUTH ONCE DAILY  . doxazosin (CARDURA) 4 MG tablet TAKE 1 TABLET BY MOUTH AT BEDTIME  . glipiZIDE (GLUCOTROL XL) 5 MG 24 hr tablet TAKE 1 TABLET(5 MG) BY MOUTH DAILY WITH BREAKFAST  . glucose blood (BAYER CONTOUR TEST) test strip Diagnoses - Diabetes E 11.8 Check blood sugar once daily  . hydrochlorothiazide (HYDRODIURIL) 25 MG tablet TAKE 1 TABLET BY MOUTH EVERY OTHER DAY  . losartan (COZAAR) 100 MG tablet TAKE 1 TABLET BY MOUTH ONCE DAILY  . metFORMIN (GLUCOPHAGE-XR) 500 MG 24 hr tablet take 2 tablets by mouth once daily with BREAKFAST  . metoprolol succinate (TOPROL-XL) 100 MG 24 hr tablet TAKE 1 TABLET BY MOUTH EVERY DAY  . PROAIR HFA 108 (90 Base) MCG/ACT inhaler INHALE 2 PUFFS BY MOUTH INTO THE LUNGS EVERY 6 HOURS AS NEEDED FOR WHEEZING AS DIRECTED  . tamsulosin (FLOMAX) 0.4 MG CAPS capsule Take 1 capsule (0.4 mg total) by mouth daily.  . traMADol (ULTRAM) 50 MG tablet Take 2 tablets (100 mg total) by mouth 2 (two) times daily.  Marland Kitchen triamcinolone ointment (KENALOG) 0.5 % Apply 1 application topically at bedtime.  Marland Kitchen HYDROcodone-acetaminophen (NORCO/VICODIN) 5-325 MG tablet Take 1 tablet by  mouth every 6 (six) hours  as needed for moderate pain.  . predniSONE (DELTASONE) 20 MG tablet Take 2 tablets (40 mg total) by mouth daily with breakfast.  . [DISCONTINUED] HYDROcodone-acetaminophen (NORCO/VICODIN) 5-325 MG tablet Take 1 tablet by mouth every 6 (six) hours as needed for moderate pain.   No facility-administered encounter medications on file as of 03/23/2018.          Objective:   Physical Exam  Constitutional: He is oriented to person, place, and time. He appears well-developed and well-nourished.  HENT:  Head: Normocephalic and atraumatic.  Eyes: Conjunctivae and EOM are normal.  Cardiovascular: Normal rate.  Pulmonary/Chest: Effort normal.  Musculoskeletal:  Have some pain with full flexion of his spine and with full extension.  Nontender over the spine itself.  Neurological: He is alert and oriented to person, place, and time.  Skin: Skin is dry. No pallor.  Psychiatric: He has a normal mood and affect. His behavior is normal.  Vitals reviewed.   skin leisure on left upper outer ear most consistent with a hyperkeratotic actinic keratosis.     Assessment & Plan:  Thoracic   Spine pain with bilateral radiation-based on his description of the pain today I think it is more concerning for herniated disc in the thoracic spine.  We will start with x-ray to also rule out any type of compression fracture.  We might even be able to pick up on herniation as well.  Go to put him on oral prednisone for 5 days as well as given a short prescription of hydrocodone for better pain control.  Please use sparingly.  will call with results once available.  Consider getting him in with sports medicine if not improving.  Actinic keratosis-cryotherapy performed.  Patient tolerated well.  If not healing then recommend biopsy.  Cryotherapy Procedure Note  Pre-operative Diagnosis: Actinic keratosis  Post-operative Diagnosis: Actinic keratosis  Locations: left upper outer ear    Indications: precancerous  Anesthesia: not required without added sodium bicarbonate  Procedure Details  Patient informed of risks (permanent scarring, infection, light or dark discoloration, bleeding, infection, weakness, numbness and recurrence of the lesion) and benefits of the procedure and verbal informed consent obtained.  The areas are treated with liquid nitrogen therapy, frozen until ice ball extended 1-2 mm beyond lesion, allowed to thaw, and treated again. The patient tolerated procedure well.  The patient was instructed on post-op care, warned that there may be blister formation, redness and pain. Recommend OTC analgesia as needed for pain.  Condition: Stable  Complications: none.  Plan: 1. Instructed to keep the area dry and covered for 24-48h and clean thereafter. 2. Warning signs of infection were reviewed.   3. Recommended that the patient use OTC acetaminophen as needed for pain.  4. Return PRN.

## 2018-03-24 ENCOUNTER — Other Ambulatory Visit: Payer: Self-pay | Admitting: Family Medicine

## 2018-03-24 DIAGNOSIS — E119 Type 2 diabetes mellitus without complications: Secondary | ICD-10-CM | POA: Diagnosis not present

## 2018-03-24 DIAGNOSIS — J449 Chronic obstructive pulmonary disease, unspecified: Secondary | ICD-10-CM | POA: Diagnosis not present

## 2018-03-24 DIAGNOSIS — I10 Essential (primary) hypertension: Secondary | ICD-10-CM | POA: Diagnosis not present

## 2018-03-24 DIAGNOSIS — Z7901 Long term (current) use of anticoagulants: Secondary | ICD-10-CM | POA: Diagnosis not present

## 2018-03-24 DIAGNOSIS — Z7982 Long term (current) use of aspirin: Secondary | ICD-10-CM | POA: Diagnosis not present

## 2018-03-24 DIAGNOSIS — I440 Atrioventricular block, first degree: Secondary | ICD-10-CM | POA: Diagnosis not present

## 2018-03-24 DIAGNOSIS — R002 Palpitations: Secondary | ICD-10-CM | POA: Diagnosis not present

## 2018-03-24 DIAGNOSIS — Z79899 Other long term (current) drug therapy: Secondary | ICD-10-CM | POA: Diagnosis not present

## 2018-03-24 DIAGNOSIS — Z7984 Long term (current) use of oral hypoglycemic drugs: Secondary | ICD-10-CM | POA: Diagnosis not present

## 2018-03-24 DIAGNOSIS — Z79891 Long term (current) use of opiate analgesic: Secondary | ICD-10-CM | POA: Diagnosis not present

## 2018-03-24 DIAGNOSIS — R9431 Abnormal electrocardiogram [ECG] [EKG]: Secondary | ICD-10-CM | POA: Diagnosis not present

## 2018-03-24 DIAGNOSIS — R7989 Other specified abnormal findings of blood chemistry: Secondary | ICD-10-CM | POA: Diagnosis not present

## 2018-03-24 DIAGNOSIS — I252 Old myocardial infarction: Secondary | ICD-10-CM | POA: Diagnosis not present

## 2018-03-24 DIAGNOSIS — Z955 Presence of coronary angioplasty implant and graft: Secondary | ICD-10-CM | POA: Diagnosis not present

## 2018-03-27 ENCOUNTER — Other Ambulatory Visit: Payer: Self-pay | Admitting: Family Medicine

## 2018-03-27 DIAGNOSIS — M546 Pain in thoracic spine: Secondary | ICD-10-CM

## 2018-03-28 ENCOUNTER — Other Ambulatory Visit: Payer: Self-pay | Admitting: *Deleted

## 2018-03-28 DIAGNOSIS — M546 Pain in thoracic spine: Secondary | ICD-10-CM

## 2018-03-31 ENCOUNTER — Telehealth: Payer: Self-pay

## 2018-03-31 DIAGNOSIS — M546 Pain in thoracic spine: Secondary | ICD-10-CM

## 2018-03-31 NOTE — Telephone Encounter (Signed)
Would decline opiate refill at this time. PCP's last note mentions short course only and referral to sports medicine if not improving. I'd see if we can get him in to Dr T or Dr Georgina Snell he hasn't seen either of them in the past.

## 2018-03-31 NOTE — Telephone Encounter (Signed)
Patient advised of recommendations.  

## 2018-03-31 NOTE — Telephone Encounter (Signed)
[name] [name] called and states he would like a refill on Hydrocodone.

## 2018-04-01 ENCOUNTER — Encounter: Payer: Self-pay | Admitting: Rehabilitative and Restorative Service Providers"

## 2018-04-01 ENCOUNTER — Ambulatory Visit: Payer: Medicare Other | Admitting: Rehabilitative and Restorative Service Providers"

## 2018-04-01 DIAGNOSIS — R29898 Other symptoms and signs involving the musculoskeletal system: Secondary | ICD-10-CM

## 2018-04-01 DIAGNOSIS — R293 Abnormal posture: Secondary | ICD-10-CM

## 2018-04-01 DIAGNOSIS — M546 Pain in thoracic spine: Secondary | ICD-10-CM
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
## 2018-04-01 NOTE — Patient Instructions (Signed)
  Shoulder Blade Squeeze    Rotate shoulders back, then squeeze shoulder blades down and back. Hold 10 sec Repeat ___5-10_ times. Do __several__ sessions per day.  Upper Back Strength: Lower Trapezius / Rotator Cuff " L's "     Arms in waitress pose, palms up. Press hands back and slide shoulder blades down. Hold for __5__ seconds. Repeat _10___ times. 1-2 times per day.    Scapular Retraction: Elbow Flexion (Standing)  "W's"     With elbows bent to 90, pinch shoulder blades together and rotate arms out, keeping elbows bent. Repeat __10__ times per set. Do __1-2__ sets per session. Do _several ___ sessions per day.  TENS UNIT: This is helpful for muscle pain and spasm.   Search and Purchase a TENS 7000 2nd edition at www.tenspros.com. It should be less than $30.     TENS unit instructions: Do not shower or bathe with the unit on Turn the unit off before removing electrodes or batteries If the electrodes lose stickiness add a drop of water to the electrodes after they are disconnected from the unit and place on plastic sheet. If you continued to have difficulty, call the TENS unit company to purchase more electrodes. Do not apply lotion on the skin area prior to use. Make sure the skin is clean and dry as this will help prolong the life of the electrodes. After use, always check skin for unusual red areas, rash or other skin difficulties. If there are any skin problems, does not apply electrodes to the same area. Never remove the electrodes from the unit by pulling the wires. Do not use the TENS unit or electrodes other than as directed. Do not change electrode placement without consultating your therapist or physician. Keep 2 fingers with between each electrode.     [facility] [place], [place] [address]  [phone] (office) [phone] (fax)

## 2018-04-03 MED ORDER — HYDROCODONE-ACETAMINOPHEN 5-325 MG PO TABS: 1.0000 | ORAL_TABLET | Freq: Four times a day (QID) | ORAL | 0 refills | Status: DC | PRN

## 2018-04-03 NOTE — Telephone Encounter (Signed)
I did refill his pain meds but we may need to consider MRI if still hurting. See if [facility] with MRI of thoracic spine.

## 2018-04-04 NOTE — Telephone Encounter (Signed)
Patient advised of recommendations. He states he has only been to PT once. He will try the PT first.

## 2018-04-05 ENCOUNTER — Encounter: Payer: Self-pay | Admitting: Rehabilitative and Restorative Service Providers"

## 2018-04-05 ENCOUNTER — Ambulatory Visit: Payer: Medicare Other | Admitting: Rehabilitative and Restorative Service Providers"

## 2018-04-05 DIAGNOSIS — R293 Abnormal posture: Secondary | ICD-10-CM | POA: Diagnosis not present

## 2018-04-05 DIAGNOSIS — R29898 Other symptoms and signs involving the musculoskeletal system: Secondary | ICD-10-CM

## 2018-04-05 DIAGNOSIS — M546 Pain in thoracic spine: Secondary | ICD-10-CM

## 2018-04-05 NOTE — Therapy (Signed)
[name] [name], [name], [address] Phone: [phone]   Fax:  [phone]  Physical Therapy Treatment  Patient Details  Name: [name] [name] MRN: [id number] Date of Birth: 02-24-1941 Referring Provider: Dr Madilyn Fireman   Encounter Date: 04/05/2018  PT End of Session - 04/05/18 1157    Visit Number  2    Number of Visits  12    Date for PT Re-Evaluation  05/13/18    PT Start Time  1149    PT Stop Time  1245    PT Time Calculation (min)  56 min    Activity Tolerance  Patient tolerated treatment well       Past Medical History:  Diagnosis Date  . Allergic rhinitis   . Bigeminy   . Bradycardia   . Bruit   . CAD (coronary artery disease)   . Claudication ([place])   . Diabetes mellitus type II   . Dyspnea   . History of prostate cancer    Dr. Gaynelle Arabian  . Hypercholesteremia    II A  . Hypertension   . Malignant neoplasm of prostate ([place])   . Microalbuminuria   . Seborrheic keratosis     Past Surgical History:  Procedure Laterality Date  . CORONARY ANGIOPLASTY WITH STENT PLACEMENT      There were no vitals filed for this visit.  Subjective Assessment - 04/05/18 1155    Subjective  Patient reports that his pain is about the same - no better. Can't lift anything or lie down due to pain. He has done his exercises at home which may have made him a little sore.     Currently in Pain?  No/denies                       [facility] Adult PT Treatment/Exercise - 04/05/18 0001      Shoulder Exercises: Seated   Other Seated Exercises  thoracic extensioin over edge of chair 3 reps 20-30 sec arms at side tiral of hands behind head created pain in the thoracic spine       Shoulder Exercises: Standing   Other Standing Exercises  scap squeeze; L's; W's x 10 with noodle       Shoulder Exercises: Stretch   Other Shoulder Stretches  3 way doorway stretch 30 sec x 2 reps each position       Moist  Heat Therapy   Number Minutes Moist Heat  20 Minutes    Moist Heat Location  -- thoracic spine - sitting       Electrical Stimulation   Electrical Stimulation Location  bilat thoracic spine     Electrical Stimulation Action  IFC    Electrical Stimulation Parameters  to tolerance    Electrical Stimulation Goals  Pain;Tone      Manual Therapy   Manual therapy comments  pt seated     Joint Mobilization  scapulae along the thoracic wall     Soft tissue mobilization  deep tissue work through the posterior thoracic region through the paraspinals and periscapular musculature; upper traps; leveator; lats Lt > Rt              PT Education - 04/05/18 1224    Education Details  HRP     Person(s) Educated  Patient    Methods  Explanation;Demonstration;Tactile cues;Verbal cues;Handout    Comprehension  Verbalized understanding;Returned demonstration;Verbal cues required;Tactile cues required  PT Long Term Goals - 04/05/18 1156      PT LONG TERM GOAL #1   Title  Improve posture and alignment with patient to demonstrate improved upright posture with posterior shoulder girdle musculature engaged 05/13/18    Time  6    Period  Weeks    Status  On-going      PT LONG TERM GOAL #2   Title  Decrease pain through thoracic spine by 50-75% allowing patient to return to more normal funcitonal activities 05/13/18    Time  6    Period  Weeks    Status  On-going      PT LONG TERM GOAL #3   Title  Improve functional mobility with patient able to perform transfers and transitional movements without pain 05/13/18    Time  6    Period  Weeks    Status  On-going      PT LONG TERM GOAL #4   Title  Independent in HEP 05/13/18    Time  6    Period  Weeks    Status  On-going      PT LONG TERM GOAL #5   Title  Improve FOTO to </= 32% limitation 05/13/18    Time  6    Period  Weeks    Status  On-going            Plan - 04/05/18 1227    Clinical Impression Statement  [name] [name] returns  with report of no significant change in the thoracic pain. He has ordered his TENS unit but has not received the unit. He has wored on his HEP. [name] [name] reports that he continues to have severe pain when he lies down, especially on firm surfaces. Patient worked in sitting and standing in clinic. Trial of deep tissue work through the thoracic paraspinal and periscapular musculature. Added and reviesed exercise. Patient has kyphotic posture; limited UE and cervical moblity; pain with functional activities expecially positional changes. No attempt to work with [name] [name] in lying positions.     Rehab Potential  Good    PT Frequency  2x / week    PT Treatment/Interventions  Patient/family education;ADLs/Self Care Home Management;Cryotherapy;Electrical Stimulation;Iontophoresis 4mg /ml Dexamethasone;Moist Heat;Ultrasound;Dry needling;Manual techniques;Neuromuscular re-education;Therapeutic activities;Therapeutic exercise    PT Next Visit Plan  review HEP; assess response to manual work through the thoracic spine; continued gentle postural correction; US/manual work through the thoracic spine     Consulted and Agree with Plan of Care  Patient       Patient will benefit from skilled therapeutic intervention in order to improve the following deficits and impairments:  Postural dysfunction, Improper body mechanics, Pain, Increased fascial restricitons, Increased muscle spasms, Decreased mobility, Decreased activity tolerance  Visit Diagnosis: Pain in thoracic spine  Other symptoms and signs involving the musculoskeletal system  Abnormal posture     Problem List Patient Active Problem List   Diagnosis Date Noted  . Frequent stools 03/15/2017  . Venous stasis dermatitis of both lower extremities 03/15/2017  . PVD (peripheral vascular disease) ([place]) 11/19/2016  . Primary osteoarthritis of left hand 04/12/2015  . Overactive bladder 10/12/2013  . Chronic kidney disease (CKD) stage G3b/A2, moderately decreased  glomerular filtration rate (GFR) between 30-44 mL/min/1.73 square meter and albuminuria creatinine ratio between 30-299 mg/g (HCC) 04/04/2013  . Rupture long head biceps tendon 12/23/2012  . Melanoma in situ ([place]) 04/22/2012  . History of MRSA infection 12/17/2011  . Carotid disease, bilateral ([place]) 05/08/2011  . ALKALINE PHOSPHATASE, ELEVATED 10/29/2009  .  HEMORRHOIDS, EXTERNAL 10/25/2009  . MUSCLE STRAIN, ABDOMINAL WALL 09/06/2009  . DYSPEPSIA 08/26/2009  . Atherosclerosis of native artery of extremity with intermittent claudication ([place]) 04/08/2009  . Rotator cuff syndrome of right shoulder 03/04/2009  . CONGENITAL PREAURICULAR CYST 03/04/2009  . HYPERCHOLESTEROLEMIA  IIA 01/18/2009  . Essential hypertension 01/18/2009  . SEBORRHEIC KERATOSIS 01/18/2009  . Other symptoms involving cardiovascular system 01/18/2009  . COPD, severe ([place]) 01/07/2009  . ALLERGIC RHINITIS 06/28/2008  . History of prostate cancer 01/19/2008  . MICROALBUMINURIA 12/05/2007  . DM type 2 (diabetes mellitus, type 2) ([place]) 11/28/2007  . Coronary atherosclerosis 11/28/2007  . Bradycardia 11/28/2007    Celyn Nilda Simmer PT, MPH  04/05/2018, 12:34 PM  [facility] [place] [employer] [place], [name], [address] Phone: [phone]   Fax:  [phone]  Name: [name] [name] MRN: [id number] Date of Birth: 12/02/1940

## 2018-04-07 ENCOUNTER — Encounter: Payer: Self-pay | Admitting: Rehabilitative and Restorative Service Providers"

## 2018-04-07 ENCOUNTER — Ambulatory Visit: Payer: Medicare Other | Admitting: Rehabilitative and Restorative Service Providers"

## 2018-04-07 DIAGNOSIS — R293 Abnormal posture: Secondary | ICD-10-CM | POA: Diagnosis not present

## 2018-04-07 DIAGNOSIS — R29898 Other symptoms and signs involving the musculoskeletal system: Secondary | ICD-10-CM

## 2018-04-07 DIAGNOSIS — M546 Pain in thoracic spine: Secondary | ICD-10-CM

## 2018-04-07 NOTE — Therapy (Addendum)
[place] [place], [place], [address] Phone: [phone]   Fax:  [phone]  Physical Therapy Treatment  Patient Details  Name: [name] [name] MRN: [id number] Date of Birth: 05-Jul-1941 Referring Provider: Dr Madilyn Fireman   Encounter Date: 04/07/2018  PT End of Session - 04/07/18 1148    Visit Number  3    Number of Visits  12    Date for PT Re-Evaluation  05/13/18    PT Start Time  1017    PT Stop Time  1242    PT Time Calculation (min)  57 min    Activity Tolerance  Patient tolerated treatment well       Past Medical History:  Diagnosis Date  . Allergic rhinitis   . Bigeminy   . Bradycardia   . Bruit   . CAD (coronary artery disease)   . Claudication ([place])   . Diabetes mellitus type II   . Dyspnea   . History of prostate cancer    Dr. Gaynelle Arabian  . Hypercholesteremia    II A  . Hypertension   . Malignant neoplasm of prostate ([place])   . Microalbuminuria   . Seborrheic keratosis     Past Surgical History:  Procedure Laterality Date  . CORONARY ANGIOPLASTY WITH STENT PLACEMENT      There were no vitals filed for this visit.  Subjective Assessment - 04/07/18 1152    Subjective  No change - symptoms are about the same. Has to wait to have an MRI until after he finishes with PT - not sure how long that will take. Does recognize aome improvement since time of injury. Can lie down in the bed now without pain. Can tell he is moving better. Will keep working on his exercises.     Currently in Pain?  No/denies                       [facility] Adult PT Treatment/Exercise - 04/07/18 0001      Shoulder Exercises: Standing   Extension  Strengthening;Right;Left;20 reps;Theraband    Theraband Level (Shoulder Extension)  Level 2 (Red)    Row  Strengthening;Right;Left;20 reps;Theraband    Theraband Level (Shoulder Row)  Level 2 (Red)    Retraction  Strengthening;Right;Left;15 reps;Theraband    Theraband Level (Shoulder Retraction)  Level 1 (Yellow) painful w/ red & yellow end range to stay in painfree range    Other Standing Exercises  scap squeeze; L's; W's x 10 with noodle       Shoulder Exercises: ROM/Strengthening   UBE (Upper Arm Bike)  L1 x 3 min alt fwd/back 1.5 min       Shoulder Exercises: Stretch   Other Shoulder Stretches  3 way doorway stretch 30 sec x 2 reps each position       Moist Heat Therapy   Number Minutes Moist Heat  20 Minutes    Moist Heat Location  -- thoracic spine - sitting       Electrical Stimulation   Electrical Stimulation Location  bilat thoracic spine     Electrical Stimulation Action  IFC    Electrical Stimulation Parameters  to tolerance    Electrical Stimulation Goals  Pain;Tone      Manual Therapy   Manual therapy comments  pt seated     Soft tissue mobilization  deep tissue work through the posterior thoracic region through the paraspinals and periscapular musculature; upper traps; leveator; lats Lt > Rt  PT Education - 04/07/18 1241    Education Details  HEP     Person(s) Educated  Patient    Methods  Explanation;Demonstration;Tactile cues;Verbal cues;Handout    Comprehension  Verbalized understanding;Returned demonstration;Verbal cues required;Tactile cues required          PT Long Term Goals - 04/05/18 1156      PT LONG TERM GOAL #1   Title  Improve posture and alignment with patient to demonstrate improved upright posture with posterior shoulder girdle musculature engaged 05/13/18    Time  6    Period  Weeks    Status  On-going      PT LONG TERM GOAL #2   Title  Decrease pain through thoracic spine by 50-75% allowing patient to return to more normal funcitonal activities 05/13/18    Time  6    Period  Weeks    Status  On-going      PT LONG TERM GOAL #3   Title  Improve functional mobility with patient able to perform transfers and transitional movements without pain 05/13/18    Time  6    Period   Weeks    Status  On-going      PT LONG TERM GOAL #4   Title  Independent in HEP 05/13/18    Time  6    Period  Weeks    Status  On-going      PT LONG TERM GOAL #5   Title  Improve FOTO to </= 32% limitation 05/13/18    Time  6    Period  Weeks    Status  On-going            Plan - 04/07/18 1157    Clinical Impression Statement  No change in symptoms with treatment or home program. He continues to have pain with lifting; push/pull; lying down on a firm/hard surface. ROM through the cervical spine is unchanged. Patient has pain with spring through the ribs. Symptoms appear to be related to ribs - strain/sprain. Xrays were negative for fractures. Patient encouraged to continue with limited activities avoiding activities that irritate the pain and continue with exercises for stretching and strengthening as tolerated; work on [employer] and alignment.     Rehab Potential  Good    PT Frequency  2x / week    PT Duration  6 weeks    PT Treatment/Interventions  Patient/family education;ADLs/Self Care Home Management;Cryotherapy;Electrical Stimulation;Iontophoresis 4mg /ml Dexamethasone;Moist Heat;Ultrasound;Dry needling;Manual techniques;Neuromuscular re-education;Therapeutic activities;Therapeutic exercise    PT Next Visit Plan  review HEP; continue with manual work through the thoracic spine as indicated; continued gentle postural correction; e-stim/manual work through the thoracic spine - may try SASH/pulley/wall slide for ROM. Patient will be working out of town next week until Friday - will schedule for Friday.     Consulted and Agree with Plan of Care  Patient       Patient will benefit from skilled therapeutic intervention in order to improve the following deficits and impairments:  Postural dysfunction, Improper body mechanics, Pain, Increased fascial restricitons, Increased muscle spasms, Decreased mobility, Decreased activity tolerance  Visit Diagnosis: Pain in thoracic spine  Other  symptoms and signs involving the musculoskeletal system  Abnormal posture     Problem List Patient Active Problem List   Diagnosis Date Noted  . Frequent stools 03/15/2017  . Venous stasis dermatitis of both lower extremities 03/15/2017  . PVD (peripheral vascular disease) ([place]) 11/19/2016  . Primary osteoarthritis of left hand 04/12/2015  . Overactive  bladder 10/12/2013  . Chronic kidney disease (CKD) stage G3b/A2, moderately decreased glomerular filtration rate (GFR) between 30-44 mL/min/1.73 square meter and albuminuria creatinine ratio between 30-299 mg/g (HCC) 04/04/2013  . Rupture long head biceps tendon 12/23/2012  . Melanoma in situ ([place]) 04/22/2012  . History of MRSA infection 12/17/2011  . Carotid disease, bilateral ([place]) 05/08/2011  . ALKALINE PHOSPHATASE, ELEVATED 10/29/2009  . HEMORRHOIDS, EXTERNAL 10/25/2009  . MUSCLE STRAIN, ABDOMINAL WALL 09/06/2009  . DYSPEPSIA 08/26/2009  . Atherosclerosis of native artery of extremity with intermittent claudication ([place]) 04/08/2009  . Rotator cuff syndrome of right shoulder 03/04/2009  . CONGENITAL PREAURICULAR CYST 03/04/2009  . HYPERCHOLESTEROLEMIA  IIA 01/18/2009  . Essential hypertension 01/18/2009  . SEBORRHEIC KERATOSIS 01/18/2009  . Other symptoms involving cardiovascular system 01/18/2009  . COPD, severe ([place]) 01/07/2009  . ALLERGIC RHINITIS 06/28/2008  . History of prostate cancer 01/19/2008  . MICROALBUMINURIA 12/05/2007  . DM type 2 (diabetes mellitus, type 2) ([place]) 11/28/2007  . Coronary atherosclerosis 11/28/2007  . Bradycardia 11/28/2007    Sydne Krahl Nilda Simmer PT, MPH  04/07/2018, 12:58 PM  [facility] [address] [place], [place], [address] Phone: [phone]   Fax:  [phone]  Name: [name] [name] MRN: [id number] Date of Birth: 24-Feb-1941

## 2018-04-07 NOTE — Patient Instructions (Signed)
Resisted External Rotation: in Neutral - Bilateral   PALMS UP Sit or stand, tubing in both hands, elbows at sides, bent to 90, forearms forward. Pinch shoulder blades together and rotate forearms out. Keep elbows at sides. Repeat __10__ times per set. Do _2-3___ sets per session. Do _1-2___ sessions per day.   Low Row: Standing   Face anchor, feet shoulder width apart. Palms up, pull arms back, squeezing shoulder blades down and back. Repeat 10__ times per set. Do 2-3__ sets per session. Do 1-2_ sessions per day Anchor Height: Waist   Strengthening: Resisted Extension   Hold tubing in both hands, arms forward. Keep shoulder blades down and back. Pull arm back, elbow straight. Repeat _10___ times per set. Do 2-3____ sets per session. Do 1-2__ sessions per day.    Try sitting in a chair with back of chair at lower shoulder blade area - lean back to stretch mid back gently - hold 20-30 sec repeat 3-4 times 2-3 times a day

## 2018-04-09 ENCOUNTER — Other Ambulatory Visit: Payer: Self-pay | Admitting: Family Medicine

## 2018-04-15 ENCOUNTER — Ambulatory Visit: Payer: Medicare Other | Admitting: Rehabilitative and Restorative Service Providers"

## 2018-04-15 ENCOUNTER — Encounter: Payer: Self-pay | Admitting: Rehabilitative and Restorative Service Providers"

## 2018-04-15 ENCOUNTER — Other Ambulatory Visit: Payer: Self-pay | Admitting: Family Medicine

## 2018-04-15 DIAGNOSIS — R293 Abnormal posture: Secondary | ICD-10-CM

## 2018-04-15 DIAGNOSIS — R29898 Other symptoms and signs involving the musculoskeletal system: Secondary | ICD-10-CM

## 2018-04-15 DIAGNOSIS — M546 Pain in thoracic spine: Secondary | ICD-10-CM | POA: Diagnosis not present

## 2018-04-15 NOTE — Therapy (Addendum)
[place], [place], [address] Phone: [phone]   Fax:  [phone]  Physical Therapy Treatment  Patient Details  Name: IKER NUTTALL MRN: [id number] Date of Birth: Jul 13, 1941 Referring Provider: Dr Madilyn Fireman   Encounter Date: 04/15/2018  PT End of Session - 04/15/18 1223    Visit Number  4    Number of Visits  12    Date for PT Re-Evaluation  05/13/18    PT Start Time  2703    PT Stop Time  1233    PT Time Calculation (min)  48 min    Activity Tolerance  Patient tolerated treatment well       Past Medical History:  Diagnosis Date  . Allergic rhinitis   . Bigeminy   . Bradycardia   . Bruit   . CAD (coronary artery disease)   . Claudication ([place])   . Diabetes mellitus type II   . Dyspnea   . History of prostate cancer    Dr. Gaynelle Arabian  . Hypercholesteremia    II A  . Hypertension   . Malignant neoplasm of prostate ([place])   . Microalbuminuria   . Seborrheic keratosis     Past Surgical History:  Procedure Laterality Date  . CORONARY ANGIOPLASTY WITH STENT PLACEMENT      There were no vitals filed for this visit.  Subjective Assessment - 04/15/18 1151    Subjective   A little change - symptoms are a little better but it still hurts with lifting and when getting in and out of the bed. Has to wait to have an MRI until after he finishes with PT - not sure how long that will take. Does recognize aome improvement since time of injury. Can lie down in the bed now without pain. Can tell he is moving better. Will keep working on his exercises.     Currently in Pain?  No/denies         [facility] PT Assessment - 04/15/18 0001      Assessment   Medical Diagnosis  Thoracic pain     Referring Provider  Dr Madilyn Fireman    Onset Date/Surgical Date  02/19/18    Hand Dominance  Left;Right    Next MD Visit  PRN    Prior Therapy  none       AROM   Right/Left Shoulder  -- ~ 70% of normal range mild  pain Lt thoracic spine     Cervical Flexion  73    Cervical Extension  35    Cervical - Right Side Bend  28    Cervical - Left Side Bend  25    Cervical - Right Rotation  70    Cervical - Left Rotation  58      Palpation   Spinal mobility  hypomobility through the thoracic spine - tenderness with CPA and lateral mobs through mid thoracic spine     Palpation comment  muscular tightness through the thoracic paraspinals; rhomboids; lower trap; middle trap; pecs; upper traps bilat Lt                    [facility] Adult PT Treatment/Exercise - 04/15/18 0001      Shoulder Exercises: Seated   Retraction  Strengthening;Right;Left;10 reps;Theraband    Theraband Level (Shoulder Retraction)  Level 1 (Yellow)    Other Seated Exercises  trunk rotation x 5 each direction; trunk extension x 5 - instructed pt to move in  pain free range       Shoulder Exercises: Standing   Flexion  AROM;Both;5 reps reaching overhead - mild pain Lt thoracic area     Other Standing Exercises  scap squeeze; L's; W's x 10 with noodle       Shoulder Exercises: ROM/Strengthening   UBE (Upper Arm Bike)  L2 x 3 min alt fwd/back 1.5 min       Shoulder Exercises: Stretch   Other Shoulder Stretches  3 way doorway stretch 30 sec x 2 reps each position       Moist Heat Therapy   Number Minutes Moist Heat  20 Minutes    Moist Heat Location  -- thoracic spine - sitting       Electrical Stimulation   Electrical Stimulation Location  Lt thoracic/scapular area     Electrical Stimulation Action  IFC    Electrical Stimulation Parameters  to tolerance    Electrical Stimulation Goals  Pain;Tone      Manual Therapy   Manual therapy comments  pt seated     Soft tissue mobilization  soft tissue work through the posterior thoracic region through the paraspinals and periscapular musculature; upper traps; leveator; lats Lt > Rt                   PT Long Term Goals - 04/15/18 1227      PT LONG TERM GOAL #1    Title  Improve posture and alignment with patient to demonstrate improved upright posture with posterior shoulder girdle musculature engaged 05/13/18    Time  6    Period  Weeks    Status  On-going      PT LONG TERM GOAL #2   Title  Decrease pain through thoracic spine by 50-75% allowing patient to return to more normal funcitonal activities 05/13/18    Time  6    Period  Weeks    Status  On-going      PT LONG TERM GOAL #3   Title  Improve functional mobility with patient able to perform transfers and transitional movements without pain 05/13/18    Time  6    Period  Weeks    Status  On-going      PT LONG TERM GOAL #4   Title  Independent in HEP 05/13/18    Time  6    Period  Weeks    Status  On-going      PT LONG TERM GOAL #5   Title  Improve FOTO to </= 32% limitation 05/13/18    Time  6    Period  Weeks    Status  On-going            Plan - 04/15/18 1223    Clinical Impression Statement  Improving cervical and thoracic mobility; less pain with functional activities and movement reported. Palpable tightness and tenderness noted through the Lt lateral and posterior thoracic spine through the mid to lower ribs and through the periscapular musculature. Responds well to gentle exercise and manual work followed by modalities. Gradual progress toward goals of therapy. Continues to have pain limiting functional activities including transition sit to sidelying/supine and reverse. Pain is worse toward the end of the day. Discussed the possibility of an elastic support to wear at nght only.     Rehab Potential  Good    PT Frequency  1x / week    PT Duration  6 weeks    PT Treatment/Interventions  Patient/family education;ADLs/Self  Care Home Management;Cryotherapy;Electrical Stimulation;Iontophoresis 9m/ml Dexamethasone;Moist Heat;Ultrasound;Dry needling;Manual techniques;Neuromuscular re-education;Therapeutic activities;Therapeutic exercise    PT Next Visit Plan  review HEP; continue  with manual work through the thoracic spine as indicated; continued gentle postural correction; e-stim/manual work through the thoracic spine -  try SASH/pulley/wall slide for ROM.     Consulted and Agree with Plan of Care  Patient       Patient will benefit from skilled therapeutic intervention in order to improve the following deficits and impairments:  Postural dysfunction, Improper body mechanics, Pain, Increased fascial restricitons, Increased muscle spasms, Decreased mobility, Decreased activity tolerance  Visit Diagnosis: Pain in thoracic spine  Other symptoms and signs involving the musculoskeletal system  Abnormal posture     Problem List Patient Active Problem List   Diagnosis Date Noted  . Frequent stools 03/15/2017  . Venous stasis dermatitis of both lower extremities 03/15/2017  . PVD (peripheral vascular disease) ([facility] 11/19/2016  . Primary osteoarthritis of left hand 04/12/2015  . Overactive bladder 10/12/2013  . Chronic kidney disease (CKD) stage G3b/A2, moderately decreased glomerular filtration rate (GFR) between 30-44 mL/min/1.73 square meter and albuminuria creatinine ratio between 30-299 mg/g (HCC) 04/04/2013  . Rupture long head biceps tendon 12/23/2012  . Melanoma in situ ([facility] 04/22/2012  . History of MRSA infection 12/17/2011  . Carotid disease, bilateral ([facility] 05/08/2011  . ALKALINE PHOSPHATASE, ELEVATED 10/29/2009  . HEMORRHOIDS, EXTERNAL 10/25/2009  . MUSCLE STRAIN, ABDOMINAL WALL 09/06/2009  . DYSPEPSIA 08/26/2009  . Atherosclerosis of native artery of extremity with intermittent claudication ([facility] 04/08/2009  . Rotator cuff syndrome of right shoulder 03/04/2009  . CONGENITAL PREAURICULAR CYST 03/04/2009  . HYPERCHOLESTEROLEMIA  IIA 01/18/2009  . Essential hypertension 01/18/2009  . SEBORRHEIC KERATOSIS 01/18/2009  . Other symptoms involving cardiovascular system 01/18/2009  . COPD, severe ([facility] 01/07/2009  . ALLERGIC RHINITIS 06/28/2008  .  History of prostate cancer 01/19/2008  . MICROALBUMINURIA 12/05/2007  . DM type 2 (diabetes mellitus, type 2) ([facility] 11/28/2007  . Coronary atherosclerosis 11/28/2007  . Bradycardia 11/28/2007    Celyn PNilda SimmerPT, MPH  04/15/2018, 12:29 PM  [facility] [address] [address]Phone: [phone]  Fax:  [phone] Name: JDEYVI BONANNOMRN: [id number] of Birth: [birth year] PHYSICAL THERAPY DISCHARGE SUMMARY  Visits from Start of Care: 4  Current functional level related to goals / functional outcomes: See progress note for discharge status    Remaining deficits: Continued pain with sit to supine and rolling/turning in bed reported at last visit    Education / Equipment: HEP  Plan: Patient agrees to discharge.  Patient goals were not met. Patient is being discharged due to not returning since the last visit.  ?????    Celyn P. HHelene KelpPT, MPH 06/01/18 1:00 PM

## 2018-04-18 ENCOUNTER — Other Ambulatory Visit: Payer: Self-pay

## 2018-04-18 DIAGNOSIS — M546 Pain in thoracic spine: Secondary | ICD-10-CM

## 2018-04-18 NOTE — Telephone Encounter (Signed)
Refill is not appropriate Patient should be assessed by sports medicine

## 2018-04-18 NOTE — Telephone Encounter (Signed)
Pt called stating he is running low on his Norco.  Last RX written 04-03-18 for #30, 1 tab Q6H PRN Pain   Requesting RF be sent to [facility] pended, please review and send if appropriate   Routing to covering provider, PCP out of office

## 2018-04-20 ENCOUNTER — Ambulatory Visit (INDEPENDENT_AMBULATORY_CARE_PROVIDER_SITE_OTHER): Payer: Medicare Other | Admitting: Family Medicine

## 2018-04-20 ENCOUNTER — Encounter: Payer: Self-pay | Admitting: Family Medicine

## 2018-04-20 VITALS — BP 166/72 | HR 81 | Wt 181.0 lb

## 2018-04-20 DIAGNOSIS — R0789 Other chest pain: Secondary | ICD-10-CM

## 2018-04-20 DIAGNOSIS — M546 Pain in thoracic spine: Secondary | ICD-10-CM | POA: Diagnosis not present

## 2018-04-20 MED ORDER — HYDROCODONE-ACETAMINOPHEN 5-325 MG PO TABS: 1.0000 | ORAL_TABLET | Freq: Four times a day (QID) | ORAL | 0 refills | Status: DC | PRN

## 2018-04-20 NOTE — Telephone Encounter (Signed)
Pt advised by scheduler and set to see sports med

## 2018-04-20 NOTE — Progress Notes (Signed)
[name] [name] is a 77 y.o. male who presents to [facility] today for rib and back pain.  [name] [name] is been dealing with back and rib pain bilaterally present for 2 months.  He denies any injury but notes that he did a lot of pushing and pulling in a few days later had onset of pain.  He notes pain in his mid thoracic back radiating around to his chest bilaterally under his nipples.  He notes the pain is worse with activity and sometimes worse with deep breathing.  He denies shortness of breath palpitation or substernal chest pain or pain with exertion.  He is tried multiple different over-the-counter medications.  Additionally he is been seen by his PCP who obtained a thoracic spine x-ray which not show any obvious etiology.  Additionally he has had a trial of physical therapy and has not improved.  His pain is controlled with new hydrocodone prescription on top of his existing chronic tramadol prescription.  He notes this is helpful to control his pain.  At this point his symptoms have been ongoing now for longer than 2 months and are failing conservative physician directed management.   ROS as above:  Exam:  BP (!) 166/72   Pulse 81   Wt 181 lb (82.1 kg)   BMI 25.97 kg/m  Gen: Well NAD HEENT: EOMI,  MMM Lungs: Normal work of breathing. CTABL Chest wall is nontender. Heart: RRR no MRG Abd: NABS, Soft. Nondistended, Nontender Exts: Brisk capillary refill, warm and well perfused.  Spine: Cervical thoracic and lumbar spine are nontender to midline. Patient is mildly tender to palpation mid thoracic paraspinal muscle group. Normal extremity motion.  Reflexes sensation and strength are intact.  Lab and Radiology Results EXAM: THORACIC SPINE - 3 VIEWS  COMPARISON:  02/28/2018  FINDINGS: Degenerative spurring throughout the thoracic spine. Normal alignment. No  fracture.  IMPRESSION: No acute bony abnormality.   Electronically Signed   By: Rolm Baptise M.D.   On: 03/23/2018 13:40 I personally (independently) visualized and performed the interpretation of the images attached in this note.    Assessment and Plan: 77 y.o. male with  Back and rib pain.  The description of pain is consistent with bilateral thoracic radicular pain at the T6 or surrounding dermatomes.  However his mechanism is unusual.  He is failing typical conservative management and quite symptomatic.  Plan for noncontrast CT scan of the chest as a next step.  This should help [place] rule out radiographically occult vertebral compression fracture rib fracture or lung or chest wall etiology.  If this is unrevealing next step would be thoracic spine MRI to evaluate for the possibility of bulging disc or neural compression.  In the meantime Norco refilled. Continue heating pad and typical conservative management.  Recheck in 2 to 3 weeks.   Orders Placed This Encounter  Procedures  . CT Chest Wo Contrast    Standing Status:   Future    Standing Expiration Date:   06/21/2019    Order Specific Question:   Preferred imaging location?    Answer:   [facility]    Order Specific Question:   Radiology Contrast Protocol - do NOT remove file path    Answer:   [email or web address]   Meds ordered this encounter  Medications  . HYDROcodone-acetaminophen (NORCO/VICODIN) 5-325 MG tablet    Sig: Take 1 tablet by mouth every 6 (six) hours as needed for moderate  pain.    Dispense:  30 tablet    Refill:  0     Historical information moved to improve visibility of documentation.  Past Medical History:  Diagnosis Date  . Allergic rhinitis   . Bigeminy   . Bradycardia   . Bruit   . CAD (coronary artery disease)   . Claudication ([place])   . Diabetes mellitus type II   . Dyspnea   . History of prostate cancer    Dr. Gaynelle Arabian  . Hypercholesteremia     II A  . Hypertension   . Malignant neoplasm of prostate ([place])   . Microalbuminuria   . Seborrheic keratosis    Past Surgical History:  Procedure Laterality Date  . CORONARY ANGIOPLASTY WITH STENT PLACEMENT     Social History   Tobacco Use  . Smoking status: Former Smoker    Packs/day: 1.00    Years: 20.00    Pack years: 20.00    Last attempt to quit: 04/22/2003    Years since quitting: 15.0  . Smokeless tobacco: Never Used  Substance Use Topics  . Alcohol use: Yes    Alcohol/week: 0.6 oz    Types: 1 Standard drinks or equivalent per week   family history includes Coronary artery disease in his brother; Heart attack in his father.  Medications: Current Outpatient Medications  Medication Sig Dispense Refill  . albuterol (PROVENTIL HFA;VENTOLIN HFA) 108 (90 Base) MCG/ACT inhaler INHALE 2 PUFFS INTO LUNGS EVERY 6 HOURS AS NEEDED FOR WHEEZING 8.5 g 0  . amLODipine (NORVASC) 10 MG tablet TAKE 1 TABLET BY MOUTH ONCE DAILY 90 tablet 1  . ANORO ELLIPTA 62.5-25 MCG/INH AEPB INHALE 1 PUFF BY MOUTH EVERY DAY 60 each 4  . aspirin EC 81 MG tablet Take 1 tablet (81 mg total) by mouth daily.    Marland Kitchen atorvastatin (LIPITOR) 40 MG tablet Take 1 tablet (40 mg total) by mouth daily. Must keep upcoming OV 90 tablet 0  . Blood Glucose Monitoring Suppl (CONTOUR BLOOD GLUCOSE SYSTEM) DEVI Diagnoses - Diabetes E 11.8 Check blood sugar once daily 1 Device 0  . clopidogrel (PLAVIX) 75 MG tablet TAKE 1 TABLET BY MOUTH ONCE DAILY 90 tablet 3  . doxazosin (CARDURA) 4 MG tablet TAKE 1 TABLET BY MOUTH AT BEDTIME 90 tablet 0  . glipiZIDE (GLUCOTROL XL) 5 MG 24 hr tablet TAKE 1 TABLET(5 MG) BY MOUTH DAILY WITH BREAKFAST 90 tablet 1  . glucose blood (BAYER CONTOUR TEST) test strip Diagnoses - Diabetes E 11.8 Check blood sugar once daily 100 each prn  . hydrochlorothiazide (HYDRODIURIL) 25 MG tablet TAKE 1 TABLET BY MOUTH EVERY OTHER DAY 45 tablet 3  . HYDROcodone-acetaminophen (NORCO/VICODIN) 5-325 MG tablet Take 1  tablet by mouth every 6 (six) hours as needed for moderate pain. 30 tablet 0  . losartan (COZAAR) 100 MG tablet TAKE 1 TABLET BY MOUTH ONCE DAILY 90 tablet 3  . metFORMIN (GLUCOPHAGE-XR) 500 MG 24 hr tablet TAKE 2 TABLETS BY MOUTH ONCE DAILY WITH BREAKFAST 180 tablet 0  . metoprolol succinate (TOPROL-XL) 100 MG 24 hr tablet TAKE 1 TABLET BY MOUTH EVERY DAY 90 tablet 0  . predniSONE (DELTASONE) 20 MG tablet Take 2 tablets (40 mg total) by mouth daily with breakfast. 10 tablet 0  . PROAIR HFA 108 (90 Base) MCG/ACT inhaler INHALE 2 PUFFS BY MOUTH INTO THE LUNGS EVERY 6 HOURS AS NEEDED FOR WHEEZING AS DIRECTED 8.5 g 2  . tamsulosin (FLOMAX) 0.4 MG CAPS capsule Take 1 capsule (  0.4 mg total) by mouth daily. 90 capsule 3  . traMADol (ULTRAM) 50 MG tablet Take 2 tablets (100 mg total) by mouth 2 (two) times daily. 120 tablet 1  . triamcinolone ointment (KENALOG) 0.5 % Apply 1 application topically at bedtime. 45 g 0   No current facility-administered medications for this visit.    No Known Allergies   Discussed warning signs or symptoms. Please see discharge instructions. Patient expresses understanding.

## 2018-04-20 NOTE — Patient Instructions (Addendum)
Thank you for coming in today. You should hear about scheduling the Chest Ct Scan.  If we do not see what is causing the pain with this test the next step is a MRI of your thoracic back.   You will hear from my office typically the day after the test.   Use hydrocodone sparingly.   Recheck in 2-3 weeks.

## 2018-04-21 ENCOUNTER — Ambulatory Visit (INDEPENDENT_AMBULATORY_CARE_PROVIDER_SITE_OTHER): Payer: Medicare Other

## 2018-04-21 ENCOUNTER — Telehealth: Payer: Self-pay | Admitting: Family Medicine

## 2018-04-21 DIAGNOSIS — R0789 Other chest pain: Secondary | ICD-10-CM

## 2018-04-21 DIAGNOSIS — I700 Atherosclerosis of aorta: Secondary | ICD-10-CM | POA: Diagnosis not present

## 2018-04-21 DIAGNOSIS — M5414 Radiculopathy, thoracic region: Secondary | ICD-10-CM

## 2018-04-21 DIAGNOSIS — M8458XA Pathological fracture in neoplastic disease, other specified site, initial encounter for fracture: Secondary | ICD-10-CM

## 2018-04-21 DIAGNOSIS — M899 Disorder of bone, unspecified: Secondary | ICD-10-CM

## 2018-04-21 DIAGNOSIS — S22000A Wedge compression fracture of unspecified thoracic vertebra, initial encounter for closed fracture: Secondary | ICD-10-CM

## 2018-04-21 DIAGNOSIS — J439 Emphysema, unspecified: Secondary | ICD-10-CM | POA: Diagnosis not present

## 2018-04-21 DIAGNOSIS — M546 Pain in thoracic spine: Secondary | ICD-10-CM

## 2018-04-22 ENCOUNTER — Telehealth: Payer: Self-pay | Admitting: Family Medicine

## 2018-04-22 NOTE — Telephone Encounter (Signed)
Thoracic spine MRI with and without contrast ordered.  Please see result note for CT scan for patient discussion.

## 2018-04-22 NOTE — Telephone Encounter (Signed)
I called [name] [name] today and discussed his CT scan results with him.  Plan for MRI with and without contrast.  He patient notes that he has difficulty laying flat.  Will discuss with MRI techs when scheduling. Follow-up with me after MRI scan.  Of note patient previously has seen Dr. Beryle Beams.

## 2018-04-23 ENCOUNTER — Ambulatory Visit (HOSPITAL_BASED_OUTPATIENT_CLINIC_OR_DEPARTMENT_OTHER)
Admission: RE | Admit: 2018-04-23 | Discharge: 2018-04-23 | Disposition: A | Discharge status: Home / Self Care | Payer: Medicare Other | Source: Ambulatory Visit | Attending: Family Medicine | Admitting: Family Medicine

## 2018-04-23 DIAGNOSIS — S299XXA Unspecified injury of thorax, initial encounter: Secondary | ICD-10-CM | POA: Diagnosis not present

## 2018-04-23 DIAGNOSIS — M8458XA Pathological fracture in neoplastic disease, other specified site, initial encounter for fracture: Secondary | ICD-10-CM | POA: Diagnosis present

## 2018-04-23 DIAGNOSIS — M4854XA Collapsed vertebra, not elsewhere classified, thoracic region, initial encounter for fracture: Secondary | ICD-10-CM | POA: Insufficient documentation

## 2018-04-23 DIAGNOSIS — M899 Disorder of bone, unspecified: Secondary | ICD-10-CM | POA: Diagnosis not present

## 2018-04-23 DIAGNOSIS — M5414 Radiculopathy, thoracic region: Secondary | ICD-10-CM | POA: Insufficient documentation

## 2018-04-23 DIAGNOSIS — S22000A Wedge compression fracture of unspecified thoracic vertebra, initial encounter for closed fracture: Secondary | ICD-10-CM

## 2018-04-23 MED ORDER — GADOBENATE DIMEGLUMINE 529 MG/ML IV SOLN
20.0000 mL | Freq: Once | INTRAVENOUS | Status: AC | PRN
  Administered 2018-04-23: 16 mL via INTRAVENOUS

## 2018-04-25 ENCOUNTER — Encounter (HOSPITAL_COMMUNITY): Payer: Medicare Other

## 2018-04-25 ENCOUNTER — Other Ambulatory Visit: Payer: Self-pay | Admitting: Sports Medicine

## 2018-04-25 ENCOUNTER — Telehealth: Payer: Self-pay | Admitting: Sports Medicine

## 2018-04-25 ENCOUNTER — Encounter: Payer: Self-pay | Admitting: Sports Medicine

## 2018-04-25 DIAGNOSIS — M4850XA Collapsed vertebra, not elsewhere classified, site unspecified, initial encounter for fracture: Secondary | ICD-10-CM | POA: Insufficient documentation

## 2018-04-25 NOTE — Assessment & Plan Note (Signed)
I will give him more details at the MRI follow-up visit but it looks like he may have a bone marrow type neoplasm/cancer. I am going to add some blood work to help try and determine this.  It is very treatable if it turns out to be multiple myeloma.   I am also going to get him set up with medical oncology.

## 2018-04-25 NOTE — Telephone Encounter (Signed)
Patient had a spinal MRI on Saturday that was ordered by Dr. Georgina Snell. Pt does not understand the results of his MRI, and he is in a lot of pain. He does not have an appointment with Dr. Georgina Snell for another two weeks and you are booked up for the next two days. Is there any way we can get him in earlier? Please let me know. Thanks!

## 2018-04-25 NOTE — Telephone Encounter (Signed)
Noted, more information at the MRI follow-up visit, I sent a result note to [facility] as well as added some routine labs, she will contact the patient and have him come in for the blood and urine testing before his appointment with me, I have also done a referral to medical oncology.

## 2018-04-26 DIAGNOSIS — R7989 Other specified abnormal findings of blood chemistry: Secondary | ICD-10-CM | POA: Diagnosis not present

## 2018-04-26 LAB — COMPLETE METABOLIC PANEL WITH GFR
AG RATIO: 1.3 (calc) (ref 1.0–2.5)
ALBUMIN MSPROF: 3.7 g/dL (ref 3.6–5.1)
ALT: 25 U/L (ref 9–46)
AST: 24 U/L (ref 10–35)
Alkaline phosphatase (APISO): 106 U/L (ref 40–115)
BUN: 24 mg/dL (ref 7–25)
CHLORIDE: 103 mmol/L (ref 98–110)
CO2: 25 mmol/L (ref 20–32)
Calcium: 9.1 mg/dL (ref 8.6–10.3)
Creat: 1.04 mg/dL (ref 0.70–1.18)
GFR, EST AFRICAN AMERICAN: 80 mL/min/{1.73_m2} (ref >=60)
GFR, Est Non African American: 69 mL/min/{1.73_m2} (ref >=60)
GLOBULIN: 2.8 g/dL (ref 1.9–3.7)
Glucose, Bld: 139 mg/dL — ABNORMAL HIGH (ref 65–99)
Potassium: 4.3 mmol/L (ref 3.5–5.3)
SODIUM: 140 mmol/L (ref 135–146)
Total Bilirubin: 1 mg/dL (ref 0.2–1.2)
Total Protein: 6.5 g/dL (ref 6.1–8.1)

## 2018-04-26 NOTE — Telephone Encounter (Signed)
Patient has been scheduled for Friday to follow up. Rhonda Cunningham,CMA

## 2018-04-27 ENCOUNTER — Telehealth: Payer: Self-pay | Admitting: Family Medicine

## 2018-04-27 DIAGNOSIS — M546 Pain in thoracic spine: Secondary | ICD-10-CM

## 2018-04-27 NOTE — Telephone Encounter (Signed)
I called the patient with results and he stated that he would like a refill of his Hydrocodone and I advised him that 30 was sent in on 04/20/2018 by Dr. Georgina Snell. He stated that has to take it every 6 hours and is down to 1 pill. He has a follow up with Dr. Darene Lamer next week and has a follow up with you on 06/27/2018. Pharmacy on file is correct. Please advise.

## 2018-04-28 MED ORDER — HYDROCODONE-ACETAMINOPHEN 5-325 MG PO TABS: 1.0000 | ORAL_TABLET | Freq: Four times a day (QID) | ORAL | 0 refills | Status: DC | PRN

## 2018-04-28 NOTE — Telephone Encounter (Signed)
Did refill until his appt.

## 2018-04-29 ENCOUNTER — Ambulatory Visit (INDEPENDENT_AMBULATORY_CARE_PROVIDER_SITE_OTHER): Payer: Medicare Other | Admitting: Sports Medicine

## 2018-04-29 ENCOUNTER — Encounter: Payer: Self-pay | Admitting: Sports Medicine

## 2018-04-29 DIAGNOSIS — M4850XA Collapsed vertebra, not elsewhere classified, site unspecified, initial encounter for fracture: Secondary | ICD-10-CM

## 2018-04-29 MED ORDER — OXYCODONE-ACETAMINOPHEN 10-325 MG PO TABS: 1.0000 | ORAL_TABLET | Freq: Three times a day (TID) | ORAL | 0 refills | Status: DC | PRN

## 2018-04-29 MED ORDER — PREDNISONE 50 MG PO TABS
ORAL_TABLET | ORAL | 0 refills | Status: DC
Start: 2018-04-29 — End: 2018-06-13

## 2018-04-29 NOTE — Assessment & Plan Note (Signed)
History of prostate cancer. 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 No neurologic compromise. Adding prednisone, oxycodone for pain. Adding labs including SPEP, UPEP, PSA. Referral to oncology.

## 2018-04-29 NOTE — Progress Notes (Signed)
Subjective:    CC: Go over MRI results  HPI: [name] [name] is a pleasant 77 year old male with a history of prostate cancer, he has been having some thoracic back pain, ultimately CT and MRI showed collapse of his vertebrae with suspicion for primary myeloma or secondary metastatic disease.  He is here to go over results of his MRI.  Pain is moderate in the mid back.  No progressive weakness or radicular symptoms.  I reviewed the past medical history, family history, social history, surgical history, and allergies today and no changes were needed.  Please see the problem list section below in epic for further details.  Past Medical History: Past Medical History:  Diagnosis Date  . Allergic rhinitis   . Bigeminy   . Bradycardia   . Bruit   . CAD (coronary artery disease)   . Claudication ([place])   . Diabetes mellitus type II   . Dyspnea   . History of prostate cancer    Dr. Gaynelle Arabian  . Hypercholesteremia    II A  . Hypertension   . Malignant neoplasm of prostate ([place])   . Microalbuminuria   . Seborrheic keratosis    Past Surgical History: Past Surgical History:  Procedure Laterality Date  . CORONARY ANGIOPLASTY WITH STENT PLACEMENT     Social History: Social History   Socioeconomic History  . Marital status: Widowed    Spouse name: Not on file  . Number of children: Not on file  . Years of education: Not on file  . Highest education level: Not on file  Occupational History  . Occupation: Retired but still works Dentist: RETIRED  Social Needs  . Financial resource strain: Not on file  . Food insecurity:    Worry: Not on file    Inability: Not on file  . Transportation needs:    Medical: Not on file    Non-medical: Not on file  Tobacco Use  . Smoking status: Former Smoker    Packs/day: 1.00    Years: 20.00    Pack years: 20.00    Last attempt to quit: 04/22/2003    Years since quitting: 15.0  . Smokeless tobacco: Never Used  Substance and Sexual  Activity  . Alcohol use: Yes    Alcohol/week: 1.0 standard drinks    Types: 1 Standard drinks or equivalent per week  . Drug use: Not on file  . Sexual activity: Not on file  Lifestyle  . Physical activity:    Days per week: Not on file    Minutes per session: Not on file  . Stress: Not on file  Relationships  . Social connections:    Talks on phone: Not on file    Gets together: Not on file    Attends religious service: Not on file    Active member of club or organization: Not on file    Attends meetings of clubs or organizations: Not on file    Relationship status: Not on file  Other Topics Concern  . Not on file  Social History Narrative   Widowed 08/2009   Has 4 kids, 2 are local   Fair diet   No regular exercise   Family History: Family History  Problem Relation Age of Onset  . Heart attack Father   . Coronary artery disease Brother    Allergies: No Known Allergies Medications: See med rec.  Review of Systems: No fevers, chills, night sweats, weight loss, chest pain, or shortness of breath.  Objective:    General: Well Developed, well nourished, and in no acute distress.  Neuro: Alert and oriented x3, extra-ocular muscles intact, sensation grossly intact.  HEENT: Normocephalic, atraumatic, pupils equal round reactive to light, neck supple, no masses, no lymphadenopathy, thyroid nonpalpable.  Skin: Warm and dry, no rashes. Cardiac: Regular rate and rhythm, no murmurs rubs or gallops, no lower extremity edema.  Respiratory: Clear to auscultation bilaterally. Not using accessory muscles, speaking in full sentences.  Impression and Recommendations:    Pathologic compression fracture of spine (HCC) History of prostate cancer. 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 No neurologic compromise. Adding prednisone, oxycodone for pain. Adding labs including SPEP, UPEP,  PSA. Referral to oncology.   I spent 25 minutes with this patient, greater than 50% was face-to-face time counseling regarding the above diagnoses ___________________________________________ Gwen Her. Dianah Field, M.D., ABFM., CAQSM. Primary Care and [place] Instructor of [employer] [facility]

## 2018-05-02 LAB — PSA, TOTAL AND FREE
PSA, % Free: 9 % (calc) — ABNORMAL LOW (ref >25)
PSA, Free: 0.1 ng/mL
PSA, Total: 1.1 ng/mL (ref <=4.0)

## 2018-05-05 ENCOUNTER — Encounter: Payer: Self-pay | Admitting: Hematology & Oncology

## 2018-05-05 ENCOUNTER — Other Ambulatory Visit: Payer: Self-pay

## 2018-05-05 ENCOUNTER — Inpatient Hospital Stay: Payer: Medicare Other

## 2018-05-05 ENCOUNTER — Inpatient Hospital Stay: Payer: Medicare Other | Attending: Hematology & Oncology | Admitting: Hematology & Oncology

## 2018-05-05 VITALS — BP 173/53 | HR 52 | Temp 98.5°F | Resp 19 | Wt 179.0 lb

## 2018-05-05 DIAGNOSIS — Z8546 Personal history of malignant neoplasm of prostate: Secondary | ICD-10-CM

## 2018-05-05 DIAGNOSIS — Z87891 Personal history of nicotine dependence: Secondary | ICD-10-CM | POA: Diagnosis not present

## 2018-05-05 DIAGNOSIS — M4854XA Collapsed vertebra, not elsewhere classified, thoracic region, initial encounter for fracture: Secondary | ICD-10-CM | POA: Diagnosis present

## 2018-05-05 DIAGNOSIS — R918 Other nonspecific abnormal finding of lung field: Secondary | ICD-10-CM | POA: Insufficient documentation

## 2018-05-05 DIAGNOSIS — I739 Peripheral vascular disease, unspecified: Secondary | ICD-10-CM

## 2018-05-05 DIAGNOSIS — S22000A Wedge compression fracture of unspecified thoracic vertebra, initial encounter for closed fracture: Secondary | ICD-10-CM

## 2018-05-05 LAB — CMP (CANCER CENTER ONLY)
ALBUMIN: 3.3 g/dL — AB (ref 3.5–5.0)
ALK PHOS: 115 U/L — AB (ref 26–84)
ALT: 31 U/L (ref 10–47)
AST: 25 U/L (ref 11–38)
Anion gap: 9 (ref 5–15)
BUN: 38 mg/dL — AB (ref 7–22)
CHLORIDE: 103 mmol/L (ref 98–108)
CO2: 29 mmol/L (ref 18–33)
CREATININE: 1.5 mg/dL — AB (ref 0.60–1.20)
Calcium: 9.9 mg/dL (ref 8.0–10.3)
Glucose, Bld: 97 mg/dL (ref 73–118)
POTASSIUM: 4.3 mmol/L (ref 3.3–4.7)
SODIUM: 141 mmol/L (ref 128–145)
Total Bilirubin: 0.7 mg/dL (ref 0.2–1.6)
Total Protein: 7 g/dL (ref 6.4–8.1)

## 2018-05-05 LAB — CBC WITH DIFFERENTIAL (CANCER CENTER ONLY)
BASOS PCT: 0 %
Basophils Absolute: 0 10*3/uL (ref 0.0–0.1)
EOS ABS: 0.1 10*3/uL (ref 0.0–0.5)
Eosinophils Relative: 1 %
HCT: 40.3 % (ref 38.7–49.9)
HEMOGLOBIN: 12.7 g/dL — AB (ref 13.0–17.1)
LYMPHS ABS: 2.3 10*3/uL (ref 0.9–3.3)
Lymphocytes Relative: 23 %
MCH: 28.2 pg (ref 28.0–33.4)
MCHC: 31.5 g/dL — AB (ref 32.0–35.9)
MCV: 89.4 fL (ref 82.0–98.0)
MONOS PCT: 8 %
Monocytes Absolute: 0.9 10*3/uL (ref 0.1–0.9)
NEUTROS PCT: 68 %
Neutro Abs: 6.9 10*3/uL — ABNORMAL HIGH (ref 1.5–6.5)
Platelet Count: 224 10*3/uL (ref 145–400)
RBC: 4.51 MIL/uL (ref 4.20–5.70)
RDW: 15.7 % (ref 11.1–15.7)
WBC Count: 10.2 10*3/uL — ABNORMAL HIGH (ref 4.0–10.0)

## 2018-05-05 LAB — CBC WITH DIFFERENTIAL/PLATELET
Basophils Absolute: 68 {cells}/uL (ref 0–200)
Basophils Relative: 1 %
Eosinophils Absolute: 381 cells/uL (ref 15–500)
Eosinophils Relative: 5.6 %
HCT: 39.9 % (ref 38.5–50.0)
Hemoglobin: 12.9 g/dL — ABNORMAL LOW (ref 13.2–17.1)
Lymphs Abs: 1394 {cells}/uL (ref 850–3900)
MCH: 27.6 pg (ref 27.0–33.0)
MCHC: 32.3 g/dL (ref 32.0–36.0)
MCV: 85.4 fL (ref 80.0–100.0)
MPV: 10.4 fL (ref 7.5–12.5)
Monocytes Relative: 8.7 %
Neutro Abs: 4366 cells/uL (ref 1500–7800)
Neutrophils Relative %: 64.2 %
Platelets: 237 Thousand/uL (ref 140–400)
RBC: 4.67 Million/uL (ref 4.20–5.80)
RDW: 14.3 % (ref 11.0–15.0)
Total Lymphocyte: 20.5 %
WBC mixed population: 592 {cells}/uL (ref 200–950)
WBC: 6.8 Thousand/uL (ref 3.8–10.8)

## 2018-05-05 LAB — COMPREHENSIVE METABOLIC PANEL
AG Ratio: 1.3 (calc) (ref 1.0–2.5)
Albumin: 3.8 g/dL (ref 3.6–5.1)
Alkaline phosphatase (APISO): 111 U/L (ref 40–115)
CO2: 26 mmol/L (ref 20–32)
Calcium: 9.8 mg/dL (ref 8.6–10.3)
Chloride: 104 mmol/L (ref 98–110)
Creat: 1.11 mg/dL (ref 0.70–1.18)
Globulin: 3 g/dL (calc) (ref 1.9–3.7)
Potassium: 4.5 mmol/L (ref 3.5–5.3)
Sodium: 141 mmol/L (ref 135–146)
Total Bilirubin: 0.7 mg/dL (ref 0.2–1.2)
Total Protein: 6.8 g/dL (ref 6.1–8.1)

## 2018-05-05 LAB — PROTEIN ELECTROPHORESIS, SERUM, WITH REFLEX
Albumin ELP: 3.1 g/dL — ABNORMAL LOW (ref 3.8–4.8)
Alpha 1: 0.5 g/dL — ABNORMAL HIGH (ref 0.2–0.3)
Alpha 2: 1.2 g/dL — ABNORMAL HIGH (ref 0.5–0.9)
Beta 2: 0.4 g/dL (ref 0.2–0.5)
Beta Globulin: 0.4 g/dL (ref 0.4–0.6)
Gamma Globulin: 0.9 g/dL (ref 0.8–1.7)
Total Protein: 6.5 g/dL (ref 6.1–8.1)

## 2018-05-05 LAB — PROTEIN ELECTROPHORESIS,RANDOM URN
Albumin: 82 %
Alpha-1-Globulin, U: 1 %
Alpha-2-Globulin, U: 5 %
Beta Globulin, U: 7 %
Creatinine, Urine: 159 mg/dL (ref 20–320)
Gamma Globulin, U: 5 %
Protein/Creat Ratio: 1082 mg/g creat — ABNORMAL HIGH (ref 22–128)
Total Protein, Urine: 172 mg/dL — ABNORMAL HIGH (ref 5–25)

## 2018-05-05 LAB — COMPREHENSIVE METABOLIC PANEL WITH GFR
ALT: 25 U/L (ref 9–46)
AST: 25 U/L (ref 10–35)
BUN/Creatinine Ratio: 25 (calc) — ABNORMAL HIGH (ref 6–22)
BUN: 28 mg/dL — ABNORMAL HIGH (ref 7–25)
Glucose, Bld: 128 mg/dL (ref 65–139)

## 2018-05-05 LAB — SAVE SMEAR

## 2018-05-05 LAB — IFE INTERPRETATION

## 2018-05-05 LAB — SEDIMENTATION RATE: Sed Rate: 39 mm/h — ABNORMAL HIGH (ref 0–20)

## 2018-05-05 NOTE — Progress Notes (Signed)
Referral MD  Reason for Referral: Compression fracture at T3  Chief Complaint  Patient presents with  . New Patient (Initial Visit)  : I hurt my back.  HPI: Mr. Rzepka is a very nice 77 year old white male.  He is very funny.  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  He has a remote history of prostate cancer.  He was left for some incontinence.  He was treated probably 1012 years ago.  He apparently said he sat the wrong way.  He was leaning back and he felt a pop between his shoulder blades.  He was seen by his family doctor.  He had a CT of the chest done.  This showed a lytic lesion with soft tissue component within the T3 vertebral body.  There was a compression fracture of approximately 50% height.  He had bilateral pulmonary nodules.  Largest was only 6 mm.  The radiologist felt that this was a metastatic process or possibly primary bone malignancy versus osteomyelitis.  They recommended a MRI.  The MRI was done on April 23, 2018.  The MRI showed a pathologic compression fracture of the T3 vertebral body with a loss of height of 60%.  There was some encroachment upon the spinal canal but no cord compression.  There is complete marrow replacement.  That was "most consistent with myeloma."  Metastatic disease was also a possibility.  There may been some slight involvement of T4.  In view of this, he was referred to the [facility] for an evaluation.  Today, no biopsy has been taken.  His last PSA a week ago was 0.1.  He had an SPEP done.  There is no monoclonal spike.  It was felt that the SPEP was consistent with an acute inflammatory issue.  His chemistry studies have been done showed a normal protein and a normal albumin.  He has not lost weight.  He has not felt any palpable lymph nodes.  He has had no change in bowel or bladder habits.  Has had no leg swelling.  He is had no moles removed.  He is to smoke.  He  stopped 16 years ago.  He probably has about a 40-pack-year history of tobacco use.  Overall, I would say his performance status is ECOG 1.    Past Medical History:  Diagnosis Date  . Allergic rhinitis   . Bigeminy   . Bradycardia   . Bruit   . CAD (coronary artery disease)   . Claudication ([place])   . Diabetes mellitus type II   . Dyspnea   . History of prostate cancer    Dr. Gaynelle Arabian  . Hypercholesteremia    II A  . Hypertension   . Malignant neoplasm of prostate ([place])   . Microalbuminuria   . Seborrheic keratosis   :  Past Surgical History:  Procedure Laterality Date  . CORONARY ANGIOPLASTY WITH STENT PLACEMENT    :   Current Outpatient Medications:  .  albuterol (PROVENTIL HFA;VENTOLIN HFA) 108 (90 Base) MCG/ACT inhaler, INHALE 2 PUFFS INTO LUNGS EVERY 6 HOURS AS NEEDED FOR WHEEZING, Disp: 8.5 g, Rfl: 0 .  amLODipine (NORVASC) 10 MG tablet, TAKE 1 TABLET BY MOUTH ONCE DAILY, Disp: 90 tablet, Rfl: 1 .  ANORO ELLIPTA 62.5-25 MCG/INH AEPB, INHALE 1 PUFF BY MOUTH EVERY DAY, Disp: 60 each, Rfl: 4 .  aspirin EC 81 MG tablet, Take 1 tablet (81 mg total)  by mouth daily., Disp: , Rfl:  .  atorvastatin (LIPITOR) 40 MG tablet, Take 1 tablet (40 mg total) by mouth daily. Must keep upcoming OV, Disp: 90 tablet, Rfl: 0 .  Blood Glucose Monitoring Suppl (CONTOUR BLOOD GLUCOSE SYSTEM) DEVI, Diagnoses - Diabetes E 11.8 Check blood sugar once daily, Disp: 1 Device, Rfl: 0 .  clopidogrel (PLAVIX) 75 MG tablet, TAKE 1 TABLET BY MOUTH ONCE DAILY, Disp: 90 tablet, Rfl: 3 .  doxazosin (CARDURA) 4 MG tablet, TAKE 1 TABLET BY MOUTH AT BEDTIME, Disp: 90 tablet, Rfl: 0 .  glipiZIDE (GLUCOTROL XL) 5 MG 24 hr tablet, TAKE 1 TABLET(5 MG) BY MOUTH DAILY WITH BREAKFAST, Disp: 90 tablet, Rfl: 1 .  glucose blood (BAYER CONTOUR TEST) test strip, Diagnoses - Diabetes E 11.8 Check blood sugar once daily, Disp: 100 each, Rfl: prn .  hydrochlorothiazide (HYDRODIURIL) 25 MG tablet, TAKE 1 TABLET BY MOUTH  EVERY OTHER DAY, Disp: 45 tablet, Rfl: 3 .  HYDROcodone-acetaminophen (NORCO/VICODIN) 5-325 MG tablet, Take 1 tablet by mouth every 6 (six) hours as needed for moderate pain., Disp: 30 tablet, Rfl: 0 .  losartan (COZAAR) 100 MG tablet, TAKE 1 TABLET BY MOUTH ONCE DAILY, Disp: 90 tablet, Rfl: 3 .  metFORMIN (GLUCOPHAGE-XR) 500 MG 24 hr tablet, TAKE 2 TABLETS BY MOUTH ONCE DAILY WITH BREAKFAST, Disp: 180 tablet, Rfl: 0 .  metoprolol succinate (TOPROL-XL) 100 MG 24 hr tablet, TAKE 1 TABLET BY MOUTH EVERY DAY, Disp: 90 tablet, Rfl: 0 .  oxyCODONE-acetaminophen (PERCOCET) 10-325 MG tablet, Take 1 tablet by mouth every 8 (eight) hours as needed for pain., Disp: 60 tablet, Rfl: 0 .  predniSONE (DELTASONE) 50 MG tablet, One tab PO daily for 5 days., Disp: 5 tablet, Rfl: 0 .  PROAIR HFA 108 (90 Base) MCG/ACT inhaler, INHALE 2 PUFFS BY MOUTH INTO THE LUNGS EVERY 6 HOURS AS NEEDED FOR WHEEZING AS DIRECTED, Disp: 8.5 g, Rfl: 2 .  tamsulosin (FLOMAX) 0.4 MG CAPS capsule, Take 1 capsule (0.4 mg total) by mouth daily., Disp: 90 capsule, Rfl: 3 .  traMADol (ULTRAM) 50 MG tablet, Take 2 tablets (100 mg total) by mouth 2 (two) times daily., Disp: 120 tablet, Rfl: 1 .  triamcinolone ointment (KENALOG) 0.5 %, Apply 1 application topically at bedtime., Disp: 45 g, Rfl: 0:  :  No Known Allergies:  Family History  Problem Relation Age of Onset  . Heart attack Father   . Coronary artery disease Brother   :  Social History   Socioeconomic History  . Marital status: Widowed    Spouse name: Not on file  . Number of children: Not on file  . Years of education: Not on file  . Highest education level: Not on file  Occupational History  . Occupation: Retired but still works Dentist: RETIRED  Social Needs  . Financial resource strain: Not on file  . Food insecurity:    Worry: Not on file    Inability: Not on file  . Transportation needs:    Medical: Not on file    Non-medical: Not on file   Tobacco Use  . Smoking status: Former Smoker    Packs/day: 1.00    Years: 20.00    Pack years: 20.00    Last attempt to quit: 04/22/2003    Years since quitting: 15.0  . Smokeless tobacco: Never Used  Substance and Sexual Activity  . Alcohol use: Yes    Alcohol/week: 1.0 standard drinks    Types:  1 Standard drinks or equivalent per week  . Drug use: Not on file  . Sexual activity: Not on file  Lifestyle  . Physical activity:    Days per week: Not on file    Minutes per session: Not on file  . Stress: Not on file  Relationships  . Social connections:    Talks on phone: Not on file    Gets together: Not on file    Attends religious service: Not on file    Active member of club or organization: Not on file    Attends meetings of clubs or organizations: Not on file    Relationship status: Not on file  . Intimate partner violence:    Fear of current or ex partner: Not on file    Emotionally abused: Not on file    Physically abused: Not on file    Forced sexual activity: Not on file  Other Topics Concern  . Not on file  Social History Narrative   Widowed 08/2009   Has 4 kids, 2 are local   Fair diet   No regular exercise  :  Review of Systems  Constitutional: Negative.   HENT: Negative.   Eyes: Negative.   Respiratory: Negative.   Cardiovascular: Negative.   Gastrointestinal: Negative.   Genitourinary: Negative.   Musculoskeletal: Positive for back pain.  Skin: Negative.   Neurological: Negative.   Endo/Heme/Allergies: Negative.   Psychiatric/Behavioral: Negative.      Exam: Well-developed well-nourished white male in no obvious distress.  Vital signs show temperature 98.2.  Pulse 52.  Blood pressure 173/53.  Weight is 179 pounds.  Head neck exam shows no ocular or oral lesions.  There are no palpable cervical or supraclavicular lymph nodes.  Lungs are clear bilaterally.  Cardiac exam regular rate and rhythm with no murmurs, rubs or bruits.  Axillary exam shows no  bilateral axillary adenopathy.  Abdomen is soft.  He is moderately obese.  He has no fluid wave.  There is no palpable liver edge.  He has no splenomegaly.  Back exam does show some focal tenderness in the upper thoracic spine.  No fullness is noted.  There is no redness.  He has no muscle spasms.  Extremities shows no clubbing, cyanosis or edema.  He has good strength in his lower extremities.  He has good range of motion of his joints.  Skin exam shows no rashes, ecchymoses or petechia.  Neurological exam shows no focal neurological deficits. @IPVITALS @   Recent Labs    05/05/18 1525  WBC 10.2*  HGB 12.7*  HCT 40.3  PLT 224   Recent Labs    05/05/18 1525  NA 141  K 4.3  CL 103  CO2 29  GLUCOSE 97  BUN 38*  CREATININE 1.50*  CALCIUM 9.9    Blood smear review: None  Pathology: None    Assessment and Plan: Mr. Joyce is a 77 year old white male.  He has a compression fracture at T3.  His pain called a malignant process.  Yet, we have no evidence of this by his lab work.  He has a normal PSA.  He has a normal SPEP.  Its unfortunate that has not yet had a biopsy.  This would really help the evaluation.  As such, we will have to order a biopsy.  I think the way to go his have radiology do a kyphoplasty or vertebroplasty with a biopsy at the same time.  He has the bilateral pulmonary nodules which are  very small.  I am not sure if these would be considered malignant.  I doubt that this is prostate cancer.  I doubt that this would be myeloma unless he has light chain myeloma.  I would think if that were the case, he would have multiple bones that were involved and not just one.  I looked at his skin.  I do not see anything that looked suspicious.  Again, we will have to get a biopsy and see what is going on.  If the biopsy does come back positive, then we will see about radiation oncology giving radiation therapy to the T3 vertebral body.  I do not see a role for neurosurgery right  now.  However, this might be necessary if the concern for malignancy is significant but yet if we cannot make a tissue diagnosis with percutaneous procedures.  I spent 60 minutes with Mr. Mckone.  All the time was spent face-to-face.  I went over his scans.  I reviewed his lab work.  I counseled him about the possible etiologies.  I help coordinate follow-up care for him.

## 2018-05-06 ENCOUNTER — Encounter: Payer: Self-pay | Admitting: Family Medicine

## 2018-05-06 ENCOUNTER — Ambulatory Visit (INDEPENDENT_AMBULATORY_CARE_PROVIDER_SITE_OTHER): Payer: Medicare Other | Admitting: Family Medicine

## 2018-05-06 VITALS — BP 176/68 | HR 66 | Wt 178.0 lb

## 2018-05-06 DIAGNOSIS — M4850XA Collapsed vertebra, not elsewhere classified, site unspecified, initial encounter for fracture: Secondary | ICD-10-CM

## 2018-05-06 LAB — PROTEIN ELECTROPHORESIS, SERUM, WITH REFLEX
A/G Ratio: 1.1 (ref 0.7–1.7)
ALPHA-2-GLOBULIN: 1.1 g/dL — AB (ref 0.4–1.0)
Albumin ELP: 3.4 g/dL (ref 2.9–4.4)
Alpha-1-Globulin: 0.3 g/dL (ref 0.0–0.4)
Beta Globulin: 0.9 g/dL (ref 0.7–1.3)
GLOBULIN, TOTAL: 3.2 g/dL (ref 2.2–3.9)
Gamma Globulin: 0.8 g/dL (ref 0.4–1.8)
Total Protein ELP: 6.6 g/dL (ref 6.0–8.5)

## 2018-05-06 LAB — IGG, IGA, IGM
IGG (IMMUNOGLOBIN G), SERUM: 799 mg/dL (ref 700–1600)
IgA: 229 mg/dL (ref 61–437)
IgM (Immunoglobulin M), Srm: 136 mg/dL (ref 15–143)

## 2018-05-06 LAB — KAPPA/LAMBDA LIGHT CHAINS
Kappa free light chain: 28.2 mg/L — ABNORMAL HIGH (ref 3.3–19.4)
Kappa, lambda light chain ratio: 1.15 (ref 0.26–1.65)
LAMDA FREE LIGHT CHAINS: 24.6 mg/L (ref 5.7–26.3)

## 2018-05-06 LAB — LACTATE DEHYDROGENASE: LDH: 176 U/L (ref 98–192)

## 2018-05-06 LAB — PROSTATE-SPECIFIC AG, SERUM (LABCORP): PROSTATE SPECIFIC AG, SERUM: 1.5 ng/mL (ref 0.0–4.0)

## 2018-05-06 LAB — BETA 2 MICROGLOBULIN, SERUM: Beta-2 Microglobulin: 3 mg/L — ABNORMAL HIGH (ref 0.6–2.4)

## 2018-05-06 NOTE — Patient Instructions (Signed)
Thank you for coming in today. Let me know if you do not hear about the back injection and biopsy from radiology.  Continue current pain.

## 2018-05-06 NOTE — Progress Notes (Signed)
[name] [name] is a 77 y.o. male who presents to [facility] today for follow up back pain.   [name] [name] was seen about 2 weeks ago for consultation for his thoracic back pain.  At that time CT scan was obtained showing a thoracic compression fracture suspicious for metastatic or multiple myeloma involvement.  He does have a history of prostate cancer treated with radioactive seed placement.  MRI T-spine with and without contrast was ordered and obtained on August 3.  MRI showed compression fracture with encroachment onto the thoracic spinal cord suspicious for malignancy. As I was out of the office my partner, Dr. Dianah Field, followed up with the result and notify the patient.  Additionally he was referred to Dr. Marin Olp.  Patient saw Dr. Marin Olp yesterday who was suspicious for myeloma or prostate cancer recurrence.  Some labs have been done in the interval including SPEP that showed a faint IgM and free lambda M spike.  PSA was also obtained and slightly increased at 1.1 from baseline of 0.39 December 2016.  Dr. Marin Olp ordered further testing and also referred to interventional spine radiology for biopsy of lesion.   [name] [name] continues to experience pain in his back similar to his visit with me on July 31.  He has pain in his thoracic back but also pain radiating around his chest wall.  His pain has been reasonably controlled with opiates.  He currently is taking oxycodone 10 mg for pain which does help.  He is in good spirits and denies any distal weakness or numbness but would like to get a move on with his treatment.      ROS as above:  Exam:  BP (!) 176/68   Pulse 66   Wt 178 lb (80.7 kg)   BMI 25.54 kg/m  Gen: Well NAD HEENT: EOMI,  MMM Lungs: Normal work of breathing. CTABL Heart: RRR no MRG Abd: NABS, Soft. Nondistended, Nontender Exts: Brisk capillary refill, warm  and well perfused.   T-spine: Tender to palpation midline.  Normal upper and lower extremity strength.  Lab and Radiology Results Results for orders placed or performed in visit on 05/05/18 (from the past 72 hour(s))  Save smear     Status: None   Collection Time: 05/05/18  3:24 PM  Result Value Ref Range   Smear Review SMEAR STAINED AND AVAILABLE FOR REVIEW     Comment: Performed at [facility] at [facility], [address], [place], [place] [address]  Prostate-Specific AG, Serum     Status: None   Collection Time: 05/05/18  3:24 PM  Result Value Ref Range   Prostate Specific Ag, Serum 1.5 0.0 - 4.0 ng/mL    Comment: (NOTE) Roche ECLIA methodology. According to the American Urological Association, Serum PSA should decrease and remain at undetectable levels after radical prostatectomy. The AUA defines biochemical recurrence as an initial PSA value 0.2 ng/mL or greater followed by a subsequent confirmatory PSA value 0.2 ng/mL or greater. Values obtained with different assay methods or kits cannot be used interchangeably.  Results cannot be interpreted as absolute evidence of the presence or absence of malignant disease. Performed At: [facility] [place], [place] [id number] Rush Farmer MD FW:[id number]   CBC with Differential ([place] Only)     Status: Abnormal   Collection Time: 05/05/18  3:25 PM  Result Value Ref Range   WBC Count 10.2 (H) 4.0 - 10.0 K/uL   RBC 4.51 4.20 - 5.70 MIL/uL   Hemoglobin 12.7 (L) 13.0 - 17.1 g/dL   HCT 40.3 38.7 - 49.9 %   MCV 89.4 82.0 - 98.0 fL   MCH 28.2 28.0 - 33.4 pg   MCHC 31.5 (L) 32.0 - 35.9 g/dL   RDW 15.7 11.1 - 15.7 %   Platelet Count 224 145 - 400 K/uL   Neutrophils Relative % 68 %   Neutro Abs 6.9 (H) 1.5 - 6.5 K/uL   Lymphocytes Relative 23 %   Lymphs Abs 2.3 0.9 - 3.3 K/uL   Monocytes Relative 8 %   Monocytes Absolute 0.9 0.1 - 0.9 K/uL   Eosinophils Relative 1 %    Eosinophils Absolute 0.1 0.0 - 0.5 K/uL   Basophils Relative 0 %   Basophils Absolute 0.0 0.0 - 0.1 K/uL    Comment: Performed at [facility] Lab at [facility], [address] [place], [place] [address]  CMP ([place] only)     Status: Abnormal   Collection Time: 05/05/18  3:25 PM  Result Value Ref Range   Sodium 141 128 - 145 mmol/L   Potassium 4.3 3.3 - 4.7 mmol/L   Chloride 103 98 - 108 mmol/L   CO2 29 18 - 33 mmol/L   Glucose, Bld 97 73 - 118 mg/dL   BUN 38 (H) 7 - 22 mg/dL   Creatinine 1.50 (H) 0.60 - 1.20 mg/dL   Calcium 9.9 8.0 - 10.3 mg/dL   Total Protein 7.0 6.4 - 8.1 g/dL   Albumin 3.3 (L) 3.5 - 5.0 g/dL   AST 25 11 - 38 U/L   ALT 31 10 - 47 U/L   Alkaline Phosphatase 115 (H) 26 - 84 U/L   Total Bilirubin 0.7 0.2 - 1.6 mg/dL   Anion gap 9 5 - 15    Comment: Performed at [facility] at [facility], [address] [place], [place] [address]  IgG, IgA, IgM     Status: None   Collection Time: 05/05/18  3:25 PM  Result Value Ref Range   IgG (Immunoglobin G), Serum 799 700 - 1,600 mg/dL   IgA 229 61 - 437 mg/dL   IgM (Immunoglobulin M), Srm 136 15 - 143 mg/dL    Comment: (NOTE) Performed At: [facility] [place], [place] [id number] Rush Farmer MD JG:[id number]   Lactate dehydrogenase     Status: None   Collection Time: 05/05/18  3:25 PM  Result Value Ref Range   LDH 176 98 - 192 U/L    Comment: Performed at [facility], [place] [address], [place], [place] [address]   No results found.    Assessment and Plan: 77 y.o. 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  This is an unusual area and an unusual situation.  It is not clear to his treatment team the exact cause of this and tissue diagnosis is currently pending.  I agree with Dr. Marin Olp that if we could do a biopsy at the same time  as a kyphoplasty that would be the likely best option.  Referral has  already been placed and I spoke with radiology at [facility] who is aware of the referral and it is currently in process to be reviewed by Dr. Estanislado Pandy for safety and appropriateness.  I have provided my direct contact number and I am happy to discuss with radiology scheduling if needed.  In the meantime pain control is reasonable with oxycodone. Continue current plan.   I spent 25 minutes with this patient, greater than 50% was face-to-face time counseling regarding ddx and plan.     Historical information moved to improve visibility of documentation.  Past Medical History:  Diagnosis Date  . Allergic rhinitis   . Bigeminy   . Bradycardia   . Bruit   . CAD (coronary artery disease)   . Claudication ([place])   . Diabetes mellitus type II   . Dyspnea   . History of prostate cancer    Dr. Gaynelle Arabian  . Hypercholesteremia    II A  . Hypertension   . Malignant neoplasm of prostate ([place])   . Microalbuminuria   . Seborrheic keratosis    Past Surgical History:  Procedure Laterality Date  . CORONARY ANGIOPLASTY WITH STENT PLACEMENT     Social History   Tobacco Use  . Smoking status: Former Smoker    Packs/day: 1.00    Years: 20.00    Pack years: 20.00    Last attempt to quit: 04/22/2003    Years since quitting: 15.0  . Smokeless tobacco: Never Used  Substance Use Topics  . Alcohol use: Yes    Alcohol/week: 1.0 standard drinks    Types: 1 Standard drinks or equivalent per week   family history includes Coronary artery disease in his brother; Heart attack in his father.  Medications: Current Outpatient Medications  Medication Sig Dispense Refill  . albuterol (PROVENTIL HFA;VENTOLIN HFA) 108 (90 Base) MCG/ACT inhaler INHALE 2 PUFFS INTO LUNGS EVERY 6 HOURS AS NEEDED FOR WHEEZING 8.5 g 0  . amLODipine (NORVASC) 10 MG tablet TAKE 1 TABLET BY MOUTH ONCE DAILY 90 tablet 1  . ANORO ELLIPTA 62.5-25 MCG/INH AEPB INHALE 1 PUFF BY MOUTH EVERY DAY 60 each 4  . aspirin EC 81 MG tablet Take 1  tablet (81 mg total) by mouth daily.    Marland Kitchen atorvastatin (LIPITOR) 40 MG tablet Take 1 tablet (40 mg total) by mouth daily. Must keep upcoming OV 90 tablet 0  . Blood Glucose Monitoring Suppl (CONTOUR BLOOD GLUCOSE SYSTEM) DEVI Diagnoses - Diabetes E 11.8 Check blood sugar once daily 1 Device 0  . clopidogrel (PLAVIX) 75 MG tablet TAKE 1 TABLET BY MOUTH ONCE DAILY 90 tablet 3  . doxazosin (CARDURA) 4 MG tablet TAKE 1 TABLET BY MOUTH AT BEDTIME 90 tablet 0  . glipiZIDE (GLUCOTROL XL) 5 MG 24 hr tablet TAKE 1 TABLET(5 MG) BY MOUTH DAILY WITH BREAKFAST 90 tablet 1  . glucose blood (BAYER CONTOUR TEST) test strip Diagnoses - Diabetes E 11.8 Check blood sugar once daily 100 each prn  . hydrochlorothiazide (HYDRODIURIL) 25 MG tablet TAKE 1 TABLET BY MOUTH EVERY OTHER DAY 45 tablet 3  . HYDROcodone-acetaminophen (NORCO/VICODIN) 5-325 MG tablet Take 1 tablet by mouth every 6 (six) hours as needed for moderate pain. 30 tablet 0  . losartan (COZAAR) 100 MG tablet TAKE 1 TABLET BY MOUTH ONCE DAILY 90 tablet 3  . metFORMIN (GLUCOPHAGE-XR) 500 MG 24 hr tablet TAKE 2 TABLETS BY MOUTH ONCE DAILY WITH BREAKFAST  180 tablet 0  . metoprolol succinate (TOPROL-XL) 100 MG 24 hr tablet TAKE 1 TABLET BY MOUTH EVERY DAY 90 tablet 0  . oxyCODONE-acetaminophen (PERCOCET) 10-325 MG tablet Take 1 tablet by mouth every 8 (eight) hours as needed for pain. 60 tablet 0  . predniSONE (DELTASONE) 50 MG tablet One tab PO daily for 5 days. 5 tablet 0  . PROAIR HFA 108 (90 Base) MCG/ACT inhaler INHALE 2 PUFFS BY MOUTH INTO THE LUNGS EVERY 6 HOURS AS NEEDED FOR WHEEZING AS DIRECTED 8.5 g 2  . tamsulosin (FLOMAX) 0.4 MG CAPS capsule Take 1 capsule (0.4 mg total) by mouth daily. 90 capsule 3  . traMADol (ULTRAM) 50 MG tablet Take 2 tablets (100 mg total) by mouth 2 (two) times daily. 120 tablet 1  . triamcinolone ointment (KENALOG) 0.5 % Apply 1 application topically at bedtime. 45 g 0   No current facility-administered medications for  this visit.    No Known Allergies   Discussed warning signs or symptoms. Please see discharge instructions. Patient expresses understanding.

## 2018-05-08 ENCOUNTER — Other Ambulatory Visit: Payer: Self-pay | Admitting: Family Medicine

## 2018-05-09 ENCOUNTER — Other Ambulatory Visit: Payer: Self-pay | Admitting: *Deleted

## 2018-05-09 DIAGNOSIS — Z8546 Personal history of malignant neoplasm of prostate: Secondary | ICD-10-CM

## 2018-05-09 DIAGNOSIS — M4854XA Collapsed vertebra, not elsewhere classified, thoracic region, initial encounter for fracture: Secondary | ICD-10-CM | POA: Diagnosis not present

## 2018-05-09 DIAGNOSIS — R918 Other nonspecific abnormal finding of lung field: Secondary | ICD-10-CM | POA: Diagnosis not present

## 2018-05-09 DIAGNOSIS — Z87891 Personal history of nicotine dependence: Secondary | ICD-10-CM | POA: Diagnosis not present

## 2018-05-09 DIAGNOSIS — I739 Peripheral vascular disease, unspecified: Secondary | ICD-10-CM

## 2018-05-11 LAB — UPEP/UIFE/LIGHT CHAINS/TP, 24-HR UR
% BETA, URINE: 10.1 %
ALBUMIN, U: 76.7 %
ALPHA 1 URINE: 2 %
ALPHA 2 UR: 4.6 %
Free Kappa Lt Chains,Ur: 25.6 mg/L — ABNORMAL HIGH (ref 1.35–24.19)
Free Kappa/Lambda Ratio: 5.75 (ref 2.04–10.37)
Free Lambda Lt Chains,Ur: 4.45 mg/L (ref 0.24–6.66)
GAMMA GLOBULIN URINE: 6.6 %
TOTAL PROTEIN, URINE-UR/DAY: 467 mg/(24.h) — AB (ref 30–150)
Total Protein, Urine: 49.2 mg/dL
Total Volume: 950

## 2018-05-12 ENCOUNTER — Encounter: Payer: Self-pay | Admitting: *Deleted

## 2018-05-15 ENCOUNTER — Other Ambulatory Visit: Payer: Self-pay | Admitting: Radiology

## 2018-05-16 ENCOUNTER — Ambulatory Visit (HOSPITAL_COMMUNITY)
Admission: RE | Admit: 2018-05-16 | Discharge: 2018-05-16 | Disposition: A | Discharge status: Home / Self Care | Payer: Medicare Other | Source: Ambulatory Visit | Attending: Hematology & Oncology | Admitting: Hematology & Oncology

## 2018-05-16 ENCOUNTER — Other Ambulatory Visit (HOSPITAL_COMMUNITY): Payer: Self-pay | Admitting: Interventional Radiology

## 2018-05-16 ENCOUNTER — Telehealth: Payer: Self-pay

## 2018-05-16 DIAGNOSIS — S22030A Wedge compression fracture of third thoracic vertebra, initial encounter for closed fracture: Secondary | ICD-10-CM | POA: Diagnosis not present

## 2018-05-16 DIAGNOSIS — M4850XA Collapsed vertebra, not elsewhere classified, site unspecified, initial encounter for fracture: Secondary | ICD-10-CM

## 2018-05-16 DIAGNOSIS — S22000A Wedge compression fracture of unspecified thoracic vertebra, initial encounter for closed fracture: Secondary | ICD-10-CM

## 2018-05-16 MED ORDER — OXYCODONE-ACETAMINOPHEN 10-325 MG PO TABS: 1.0000 | ORAL_TABLET | Freq: Three times a day (TID) | ORAL | 0 refills | Status: DC | PRN

## 2018-05-16 NOTE — Telephone Encounter (Signed)
Left pt msg on home phone that RX was sent in

## 2018-05-16 NOTE — Telephone Encounter (Signed)
Spoke with him ok for surgery and to stop ASA/Plavix

## 2018-05-16 NOTE — Telephone Encounter (Signed)
   [facility] Pre-operative Risk Assessment    Request for surgical clearance:  1. What type of surgery is being performed?  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 When is this surgery scheduled? ASAP Waiting on Medical Clearance.   3. What type of clearance is required (medical clearance vs. Pharmacy clearance to hold med vs. Both)? Pharmacy clearance  4. Are there any medications that need to be held prior to surgery and how long? Aspirin and Plavix for 5 days  5. Practice name and name of physician performing surgery? Dr. Luanne Bras   6. What is your office phone number [phone] or [phone], cell [phone]   7.   What is your office fax number None provided  8.   Anesthesia type (None, local, MAC, general) ? unkknown   [name] [name] 05/16/2018, 1:37 PM  _________________________________________________________________   (provider comments below)

## 2018-05-16 NOTE — Telephone Encounter (Signed)
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

## 2018-05-16 NOTE — Telephone Encounter (Signed)
Pt called requesting RF on Percocet  Last RX written 04-29-18 for #60, 1 tab q8h PRN pain   OK to RF?

## 2018-05-16 NOTE — Consult Note (Signed)
Chief Complaint: Patient was seen in consultation today for thoracic 3 compression fracture.  Referring Physician(s): Ennever,Peter R  Supervising Physician: Luanne Bras  Patient Status: [place] - Out-pt  History of Present Illness: [name] [name] is a 77 y.o. male with a past medical history of hypertension, hypercholesteremia, CAD, bradycardia, prostate cancer, diabetes mellitus type II, microalbuminuria, and allergic rhinitis. A few months ago, he was doing some yard work including pulling large/heavy branches. A few days later, he developed onset of back pain. He saw his PCP and Dr. Georgina Snell (Family/Sports Medicine) for evaluation of back pain.   CT chest 04/21/2018: 1. Lytic lesion with soft tissue component within T3 vertebral body with associated compression fracture with approximately 50% height loss. 6 mm posterior extension into the spinal canal causes apparent effacement of the anterior CSF space and possible compression of the spinal cord. This may represent metastatic disease, primary bone malignancy versus osteomyelitis/granulomatous disease. Atypical infection such as tuberculosis may also be considered. If further imaging evaluation is required, MRI of the thoracic spine with contrast may be considered. 2. Moderate to severe upper lobe predominant emphysema and chronic bronchitis. 3. Bilateral pulmonary nodules, the largest measuring 6 mm. Non-contrast chest CT at 3-6 months is recommended. If the nodules are stable at time of repeat CT, then future CT at 18-24 months (from today's scan) is considered optional for low-risk patients, but is recommended for high-risk patients. This recommendation follows the consensus statement: Guidelines for Management of Incidental Pulmonary Nodules Detected on CT Images: From the Fleischner Society 2017; Radiology 2017; 284:228-243. 4. Reactive versus malignant mediastinal/bilateral hilar lymphadenopathy. 5. Aortic Atherosclerosis (ICD10-I70.0)  and Emphysema (ICD10-J43.9).  MRI thoracic spine 04/23/2018: 1. Pathologic compression fracture of the T3 vertebral body with loss of height of 60%. Posterior bowing of the posterior margin of the vertebral body by 5-6 mm. This encroaches upon the canal but does not appear to physically compress the cord. Most likely diagnosis is myeloma. Metastatic disease could have a similar appearance.  2. Abnormal signal also affects the anterior superior corner of the T4 vertebral body which could be involved by the same process. Alternatively, this could be bone marrow edema related to altered stress.  The results from these imaging scans lead patient to be referred to Dr. Marin Olp (Oncology) for further management of findings. IR requested by Dr. Marin Olp for management of thoracic 3 compression fracture. Patient awake and alert sitting in chair. Complains of back pain, rated 2/10 at this time. States pain is rated 10/10 when it is at its worse. States that moving increases pain. States he takes Narco for pain with little relief of symptom. Denies fever, chills, numbness/tingling down arms/legs, or bladder/bowel incontinence.  Patient is currently taking Plavix 75 mg once daily and Aspirin 81 mg once daily- states he had a drug-eluting stent placed in his CAD in 2007 by Dr. Junie Panning.   Past Medical History:  Diagnosis Date  . Allergic rhinitis   . [place]em[place]   . Bradycardia   . Bruit   . CAD (coronary artery disease)   . Claudication ([place])   . Diabetes mellitus type II   . Dyspnea   . History of prostate cancer    Dr. Gaynelle Arabian  . Hypercholesteremia    II A  . Hypertension   . Malignant neoplasm of prostate ([place])   . Microalbuminuria   . Seborrheic keratosis     Past Surgical History:  Procedure Laterality Date  . CORONARY ANGIOPLASTY WITH STENT PLACEMENT  Allergies: Patient has no known allergies.  Medications: Prior to Admission medications   Medication Sig Start Date End Date  Taking? Authorizing Provider  albuterol (PROVENTIL HFA;VENTOLIN HFA) 108 (90 Base) MCG/ACT inhaler INHALE 2 PUFFS INTO LUNGS EVERY 6 HOURS AS NEEDED FOR WHEEZING 04/15/18   Hali Marry, MD  amLODipine (NORVASC) 10 MG tablet TAKE 1 TABLET BY MOUTH ONCE DAILY 11/18/17   Hali Marry, MD  ANORO ELLIPTA 62.5-25 MCG/INH AEPB INHALE 1 PUFF BY MOUTH EVERY DAY 05/09/18   Hali Marry, MD  aspirin EC 81 MG tablet Take 1 tablet (81 mg total) by mouth daily. 07/23/17   Josue Hector, MD  atorvastatin (LIPITOR) 40 MG tablet Take 1 tablet (40 mg total) by mouth daily. Must keep upcoming OV 01/24/18   Hali Marry, MD  Blood Glucose Monitoring Suppl (CONTOUR BLOOD GLUCOSE SYSTEM) DEVI Diagnoses - Diabetes E 11.8 Check blood sugar once daily 04/20/16   Hali Marry, MD  clopidogrel (PLAVIX) 75 MG tablet TAKE 1 TABLET BY MOUTH ONCE DAILY 12/20/17   Josue Hector, MD  doxazosin (CARDURA) 4 MG tablet TAKE 1 TABLET BY MOUTH AT BEDTIME 04/12/18   Hali Marry, MD  glipiZIDE (GLUCOTROL XL) 5 MG 24 hr tablet TAKE 1 TABLET(5 MG) BY MOUTH DAILY WITH BREAKFAST 12/09/17   Hali Marry, MD  glucose blood (BAYER CONTOUR TEST) test strip Diagnoses - Diabetes E 11.8 Check blood sugar once daily 04/20/16   Hali Marry, MD  hydrochlorothiazide (HYDRODIURIL) 25 MG tablet TAKE 1 TABLET BY MOUTH EVERY OTHER DAY 12/20/17   Josue Hector, MD  HYDROcodone-acetaminophen (NORCO/VICODIN) 5-325 MG tablet Take 1 tablet by mouth every 6 (six) hours as needed for moderate pain. 04/28/18   Hali Marry, MD  losartan (COZAAR) 100 MG tablet TAKE 1 TABLET BY MOUTH ONCE DAILY 12/20/17   Josue Hector, MD  metFORMIN (GLUCOPHAGE-XR) 500 MG 24 hr tablet TAKE 2 TABLETS BY MOUTH ONCE DAILY WITH BREAKFAST 03/25/18   Hali Marry, MD  metoprolol succinate (TOPROL-XL) 100 MG 24 hr tablet TAKE 1 TABLET BY MOUTH EVERY DAY 03/16/18   Hali Marry, MD    oxyCODONE-acetaminophen (PERCOCET) 10-325 MG tablet Take 1 tablet by mouth every 8 (eight) hours as needed for pain. 04/29/18   Silverio Decamp, MD  predniSONE (DELTASONE) 50 MG tablet One tab PO daily for 5 days. 04/29/18   Silverio Decamp, MD  PROAIR HFA [id number] Base) MCG/ACT inhaler INHALE 2 PUFFS BY MOUTH INTO THE LUNGS EVERY 6 HOURS AS NEEDED FOR WHEEZING AS DIRECTED 09/27/17   Hali Marry, MD  tamsulosin (FLOMAX) 0.4 MG CAPS capsule Take 1 capsule (0.4 mg total) by mouth daily. 10/21/15   Hali Marry, MD  traMADol (ULTRAM) 50 MG tablet Take 2 tablets (100 mg total) by mouth 2 (two) times daily. 03/17/18   Hali Marry, MD  triamcinolone ointment (KENALOG) 0.5 % Apply 1 application topically at bedtime. 11/26/16   Hali Marry, MD     Family History  Problem Relation Age of Onset  . Heart attack Father   . Coronary artery disease Brother     Social History   Socioeconomic History  . Marital status: Widowed    Spouse name: Not on file  . Number of children: Not on file  . Years of education: Not on file  . Highest education level: Not on file  Occupational History  . Occupation: Retired but still  works Dentist: RETIRED  Social Needs  . Financial resource strain: Not on file  . Food insecurity:    Worry: Not on file    Inability: Not on file  . Transportation needs:    Medical: Not on file    Non-medical: Not on file  Tobacco Use  . Smoking status: Former Smoker    Packs/day: 1.00    Years: 20.00    Pack years: 20.00    Last attempt to quit: 04/22/2003    Years since quitting: 15.0  . Smokeless tobacco: Never Used  Substance and Sexual Activity  . Alcohol use: Yes    Alcohol/week: 1.0 standard drinks    Types: 1 Standard drinks or equivalent per week  . Drug use: Not on file  . Sexual activity: Not on file  Lifestyle  . Physical activity:    Days per week: Not on file    Minutes per session: Not on file   . Stress: Not on file  Relationships  . Social connections:    Talks on phone: Not on file    Gets together: Not on file    Attends religious service: Not on file    Active member of club or organization: Not on file    Attends meetings of clubs or organizations: Not on file    Relationship status: Not on file  Other Topics Concern  . Not on file  Social History Narrative   Widowed 08/2009   Has 4 kids, 2 are local   Fair diet   No regular exercise     Review of Systems: A 12 point ROS discussed and pertinent positives are indicated in the HPI above.  All other systems are negative.  Review of Systems  Constitutional: Negative for chills and fever.  Respiratory: Negative for shortness of breath and wheezing.   Cardiovascular: Negative for chest pain and palpitations.  Gastrointestinal:       Negative for bowel incontinence.  Genitourinary:       Negative for bladder incontinence.  Musculoskeletal: Positive for back pain.  Neurological: Negative for numbness.  Psychiatric/Behavioral: Negative for behavioral problems and confusion.    Vital Signs: There were no vitals taken for this visit.  Physical Exam  Constitutional: He is oriented to person, place, and time. He appears well-developed and well-nourished. No distress.  Pulmonary/Chest: Effort normal. No respiratory distress.  Musculoskeletal:  Mild tenderness of midline spine at approximate level of thoracic 3.  Neurological: He is alert and oriented to person, place, and time.  Skin: Skin is warm and dry.  Psychiatric: He has a normal mood and affect. His behavior is normal. Judgment and thought content normal.     Imaging: Ct Chest Wo Contrast  Result Date: 04/21/2018 CLINICAL DATA:  Chest/rib pain after physical exertion. EXAM: CT CHEST WITHOUT CONTRAST TECHNIQUE: Multidetector CT imaging of the chest was performed following the standard protocol without IV contrast. COMPARISON:  Chest radiograph 02/28/2018  FINDINGS: Cardiovascular: Normal heart size. Mild anterior pericardial thickening. Heavy calcific atherosclerotic disease of the coronary arteries and aorta. Mediastinum/Nodes: Mildly prominent bilateral hilar and subcarinal lymph nodes with the largest measuring 12 mm in short axis. Normal appearance of the trachea and esophagus. Lungs/Pleura: Moderate to severe upper lobe predominant centrilobular and paraseptal emphysema. Pleural/subpleural scarring in the right lung apex. Mild bronchiectasis and bronchial thickening. Partially calcified soft tissue nodule in the right upper lobe measures 4 mm, image 38/156, sequence 3. 5 mm soft tissue nodule in  the right middle lobe, image 106/156, sequence 3. Left lower lobe subpleural soft tissue nodule measures 6 mm, image 110/156, sequence 3. Other tiny subpleural nodules noted in the right lower lobe. Lingular atelectasis versus scarring noted. Upper Abdomen: No acute abnormality. Musculoskeletal: No evidence of rib fractures. There is a lytic lesion with soft tissue component within T3 vertebral body with associated compression fracture with approximately 50% height loss. The process extends 6 mm posteriorly into the spinal canal with apparent focal narrowing. There is apparent focal effacement of the anterior CSF space and possible compression of the spinal cord. There is possible extension of the process into the anterior aspect of T4 vertebral body, which appears lytic. IMPRESSION: Lytic lesion with soft tissue component within T3 vertebral body with associated compression fracture with approximately 50% height loss. 6 mm posterior extension into the spinal canal causes apparent effacement of the anterior CSF space and possible compression of the spinal cord. This may represent metastatic disease, primary bone malignancy versus osteomyelitis/granulomatous disease. Atypical infection such as tuberculosis may also be considered. If further imaging evaluation is required,  MRI of the thoracic spine with contrast may be considered. Moderate to severe upper lobe predominant emphysema and chronic bronchitis. Bilateral pulmonary nodules, the largest measuring 6 mm. Non-contrast chest CT at 3-6 months is recommended. If the nodules are stable at time of repeat CT, then future CT at 18-24 months (from today's scan) is considered optional for low-risk patients, but is recommended for high-risk patients. This recommendation follows the consensus statement: Guidelines for Management of Incidental Pulmonary Nodules Detected on CT Images: From the Fleischner Society 2017; Radiology 2017; 284:228-243. Reactive versus malignant mediastinal/bilateral hilar lymphadenopathy. Aortic Atherosclerosis (ICD10-I70.0) and Emphysema (ICD10-J43.9). These results were called by telephone at the time of interpretation on 04/21/2018 at 3:00 pm to Dr. Lynne Leader , who verbally acknowledged these results. Electronically Signed   By: Fidela Salisbury M.D.   On: 04/21/2018 15:04   Mr Thoracic Spine W Wo Contrast  Result Date: 04/23/2018 CLINICAL DATA:  Back pain since lifting injury 2 months ago. Abnormal CT done 2 days ago. EXAM: MRI THORACIC WITHOUT AND WITH CONTRAST TECHNIQUE: Multiplanar and multiecho pulse sequences of the thoracic spine were obtained without and with intravenous contrast. CONTRAST:  4mL MULTIHANCE GADOBENATE DIMEGLUMINE 529 MG/ML IV SOLN COMPARISON:  Chest CT 04/21/2018 FINDINGS: MRI THORACIC SPINE FINDINGS Alignment:  Normal Vertebrae: Pathologic compression fracture of the T3 vertebral body with loss of height of 60%. Posterior bowing of the posterior margin of the vertebral body by 5-6 mm encroaches upon the spinal canal but does not appear to physically compress the cord. Complete marrow replacement of the T3 vertebral body most consistent with myeloma. Metastatic disease is possible. There is also abnormal signal within the anterior superior corner of the T4 vertebral body which  could also be involved. No other vertebral body abnormality in the thoracic region. Scout view in the cervical region does not show any lesion. L1 is negative. Cord:  No cord compression or primary cord lesion. Paraspinal and other soft tissues: Negative Disc levels: No thoracic disc pathology. IMPRESSION: Pathologic compression fracture of the T3 vertebral body with loss of height of 60%. Posterior bowing of the posterior margin of the vertebral body by 5-6 mm. This encroaches upon the canal but does not appear to physically compress the cord. Most likely diagnosis is myeloma. Metastatic disease could have a similar appearance. Abnormal signal also affects the anterior superior corner of the T4 vertebral body which  could be involved by the same process. Alternatively, this could be bone marrow edema related to altered stress. Electronically Signed   By: Nelson Chimes M.D.   On: 04/23/2018 20:21    Labs:  CBC: Recent Labs    10/01/17 1544 02/25/18 1201 04/29/18 1047 05/05/18 1525  WBC 8.6 8.3 6.8 10.2*  HGB 13.0* 13.6 12.9* 12.7*  HCT 39.1 39.7 39.9 40.3  PLT 213 162 237 224    COAGS: No results for input(s): INR, APTT in the last 8760 hours.  BMP: Recent Labs    10/01/17 1544 02/25/18 1201 02/28/18 1429 04/26/18 0900 04/29/18 1047 05/05/18 1525  NA 143 141 141 140 141 141  K 4.0 4.2 4.2 4.3 4.5 4.3  CL 107 107 106 103 104 103  CO2 30 25 26 25 26 29   GLUCOSE 83 104 139* 139* 128 97  BUN 27* 39* 36* 24 28* 38*  CALCIUM 9.4 10.0 9.8 9.1 9.8 9.9  CREATININE 1.10 1.43* 1.43* 1.04 1.11 1.50*  GFRNONAA 65 47* 47* 69  --   --   GFRAA 75 55* 55* 80  --   --     LIVER FUNCTION TESTS: Recent Labs    02/28/18 1429 04/26/18 0900 04/29/18 1047 05/05/18 1525  BILITOT 0.5 1.0 0.7 0.7  AST 14 24 25 25   ALT 13 25 25 31   ALKPHOS  --   --   --  115*  PROT 6.5 6.5 6.8  6.5 7.0  ALBUMIN  --   --   --  3.3*    TUMOR MARKERS: No results for input(s): AFPTM, CEA, CA199, CHROMGRNA in  the last 8760 hours.  Assessment and Plan:  Thoracic 3 compression fracture. Reviewed imaging with patient. Brought to his attention was a compression fracture of his third vertebral body. Explained that this is the probable cause of his back pain. Explained that the best course of management at this time for patient's thoracic 3 compression fracture is with a procedure called a thoracic 3 vertebroplasty. Explained procedure, including risks and benefits. Explained that the goal of this procedure is to decrease patient's pain level and to stabilize his fracture.  Patient is currently taking Plavix 75 mg once daily and Aspirin 81 mg once daily- states he had a drug-eluting stent placed in his CAD in 2007 by Dr. Junie Panning. Informed patient that he will need to be off Plavix for a minimum of 5 days prior to procedure per IR protocol. Informed patient that we will need to discuss case with Dr. Junie Panning before he stops Plavix. Dr. Estanislado Pandy personally spoke with Dr. Junie Panning who approved patient holding Plavix for 5 days prior to procedure. Patient is to continue taking Aspirin 81 mg once daily.  Plan for follow-up with an image-guided thoracic 3 vertebroplasty with bone biopsy ASAP. Informed patient that our schedulers will call him to set up this procedure. Instructed patient to continue taking Narco as directed for pain management.  All questions answered and concerns addressed. Patient conveys understanding and agrees with plan.  Thank you for this interesting consult.  I greatly enjoyed meeting NASHAUN HILLMER and look forward to participating in their care.  A copy of this report was sent to the requesting provider on this date.  Electronically Signed: Earley Abide, PA-C 05/16/2018, 8:50 AM   I spent a total of 30 Minutes in face to face in clinical consultation, greater than 50% of which was counseling/coordinating care for thoracic 3 compression fracture.

## 2018-05-17 ENCOUNTER — Other Ambulatory Visit (HOSPITAL_COMMUNITY): Payer: Self-pay | Admitting: Interventional Radiology

## 2018-05-17 ENCOUNTER — Other Ambulatory Visit: Payer: Self-pay | Admitting: Family Medicine

## 2018-05-17 ENCOUNTER — Telehealth (HOSPITAL_COMMUNITY): Payer: Self-pay

## 2018-05-17 DIAGNOSIS — S22030A Wedge compression fracture of third thoracic vertebra, initial encounter for closed fracture: Secondary | ICD-10-CM

## 2018-05-17 NOTE — Telephone Encounter (Signed)
Called to schedule vertebroplasty, no answer, left vm. AW 

## 2018-05-18 NOTE — Telephone Encounter (Signed)
    Dr Johnsie Cancel spoke to the patient.  He is okay for surgery.  Okay to stop aspirin and Plavix for 5 days prior to the procedure.  I will route this to the requesting physician and delete it from the preop pool.  5. Dr. Luanne Bras   6. What is your office phone number [phone] or [phone], cell [phone]

## 2018-05-19 ENCOUNTER — Other Ambulatory Visit: Payer: Self-pay | Admitting: Radiology

## 2018-05-19 NOTE — Telephone Encounter (Signed)
Faxed request to Dr Estanislado Pandy; but receptionist stated they have access to epic. Sent to Dr Estanislado Pandy as well.

## 2018-05-24 ENCOUNTER — Other Ambulatory Visit: Payer: Self-pay | Admitting: Hematology & Oncology

## 2018-05-24 ENCOUNTER — Other Ambulatory Visit (HOSPITAL_COMMUNITY): Payer: Self-pay | Admitting: Interventional Radiology

## 2018-05-24 ENCOUNTER — Other Ambulatory Visit: Payer: Self-pay

## 2018-05-24 ENCOUNTER — Ambulatory Visit (HOSPITAL_COMMUNITY)
Admission: RE | Admit: 2018-05-24 | Discharge: 2018-05-24 | Disposition: A | Discharge status: Home / Self Care | Payer: Medicare Other | Source: Ambulatory Visit | Attending: Interventional Radiology | Admitting: Interventional Radiology

## 2018-05-24 ENCOUNTER — Other Ambulatory Visit: Payer: Self-pay | Admitting: *Deleted

## 2018-05-24 DIAGNOSIS — Z8546 Personal history of malignant neoplasm of prostate: Secondary | ICD-10-CM

## 2018-05-24 DIAGNOSIS — Z7902 Long term (current) use of antithrombotics/antiplatelets: Secondary | ICD-10-CM | POA: Insufficient documentation

## 2018-05-24 DIAGNOSIS — I2510 Atherosclerotic heart disease of native coronary artery without angina pectoris: Secondary | ICD-10-CM | POA: Diagnosis not present

## 2018-05-24 DIAGNOSIS — Z7952 Long term (current) use of systemic steroids: Secondary | ICD-10-CM | POA: Diagnosis not present

## 2018-05-24 DIAGNOSIS — Z955 Presence of coronary angioplasty implant and graft: Secondary | ICD-10-CM | POA: Diagnosis not present

## 2018-05-24 DIAGNOSIS — Z5309 Procedure and treatment not carried out because of other contraindication: Secondary | ICD-10-CM | POA: Insufficient documentation

## 2018-05-24 DIAGNOSIS — S22030A Wedge compression fracture of third thoracic vertebra, initial encounter for closed fracture: Secondary | ICD-10-CM

## 2018-05-24 DIAGNOSIS — Z7984 Long term (current) use of oral hypoglycemic drugs: Secondary | ICD-10-CM | POA: Insufficient documentation

## 2018-05-24 DIAGNOSIS — M546 Pain in thoracic spine: Secondary | ICD-10-CM | POA: Diagnosis not present

## 2018-05-24 DIAGNOSIS — E119 Type 2 diabetes mellitus without complications: Secondary | ICD-10-CM | POA: Insufficient documentation

## 2018-05-24 DIAGNOSIS — Z87891 Personal history of nicotine dependence: Secondary | ICD-10-CM | POA: Diagnosis not present

## 2018-05-24 DIAGNOSIS — I10 Essential (primary) hypertension: Secondary | ICD-10-CM | POA: Diagnosis not present

## 2018-05-24 DIAGNOSIS — Z7982 Long term (current) use of aspirin: Secondary | ICD-10-CM | POA: Insufficient documentation

## 2018-05-24 DIAGNOSIS — M4854XA Collapsed vertebra, not elsewhere classified, thoracic region, initial encounter for fracture: Secondary | ICD-10-CM | POA: Insufficient documentation

## 2018-05-24 DIAGNOSIS — E7800 Pure hypercholesterolemia, unspecified: Secondary | ICD-10-CM | POA: Diagnosis not present

## 2018-05-24 HISTORY — PX: IR FLUORO RM 30-60 MIN: IMG2384

## 2018-05-24 LAB — GLUCOSE, CAPILLARY: Glucose-Capillary: 127 mg/dL — ABNORMAL HIGH (ref 70–99)

## 2018-05-24 LAB — CBC
HCT: 40.5 % (ref 39.0–52.0)
Hemoglobin: 12.3 g/dL — ABNORMAL LOW (ref 13.0–17.0)
MCH: 27.8 pg (ref 26.0–34.0)
MCHC: 30.4 g/dL (ref 30.0–36.0)
MCV: 91.4 fL (ref 78.0–100.0)
PLATELETS: 152 10*3/uL (ref 150–400)
RBC: 4.43 MIL/uL (ref 4.22–5.81)
RDW: 15.6 % — ABNORMAL HIGH (ref 11.5–15.5)
WBC: 5.4 10*3/uL (ref 4.0–10.5)

## 2018-05-24 LAB — BASIC METABOLIC PANEL
Anion gap: 9 (ref 5–15)
BUN: 24 mg/dL — ABNORMAL HIGH (ref 8–23)
CALCIUM: 9.6 mg/dL (ref 8.9–10.3)
CO2: 26 mmol/L (ref 22–32)
CREATININE: 1.06 mg/dL (ref 0.61–1.24)
Chloride: 107 mmol/L (ref 98–111)
Glucose, Bld: 142 mg/dL — ABNORMAL HIGH (ref 70–99)
Potassium: 3.9 mmol/L (ref 3.5–5.1)
SODIUM: 142 mmol/L (ref 135–145)

## 2018-05-24 LAB — PROTIME-INR
INR: 0.97
PROTHROMBIN TIME: 12.8 s (ref 11.4–15.2)

## 2018-05-24 MED ORDER — TOBRAMYCIN SULFATE 1.2 G IJ SOLR
INTRAMUSCULAR | Status: AC
  Filled 2018-05-24: qty 1.2

## 2018-05-24 MED ORDER — BUPIVACAINE HCL (PF) 0.5 % IJ SOLN
INTRAMUSCULAR | Status: AC
  Filled 2018-05-24: qty 30

## 2018-05-24 MED ORDER — MIDAZOLAM HCL 2 MG/2ML IJ SOLN
INTRAMUSCULAR | Status: AC
  Filled 2018-05-24: qty 2

## 2018-05-24 MED ORDER — SODIUM CHLORIDE 0.9 % IV SOLN
INTRAVENOUS | Status: DC
  Administered 2018-05-24: 11:00:00 via INTRAVENOUS

## 2018-05-24 MED ORDER — HYDROMORPHONE HCL 1 MG/ML IJ SOLN
INTRAMUSCULAR | Status: AC
  Filled 2018-05-24: qty 1

## 2018-05-24 MED ORDER — FENTANYL CITRATE (PF) 100 MCG/2ML IJ SOLN
INTRAMUSCULAR | Status: AC
  Filled 2018-05-24: qty 2

## 2018-05-24 MED ORDER — CEFAZOLIN SODIUM-DEXTROSE 2-4 GM/100ML-% IV SOLN: 2.0000 g | INTRAVENOUS | Status: DC

## 2018-05-24 MED ORDER — IOPAMIDOL (ISOVUE-300) INJECTION 61%
INTRAVENOUS | Status: AC
  Filled 2018-05-24: qty 50

## 2018-05-24 MED ORDER — GELATIN ABSORBABLE 12-7 MM EX MISC
CUTANEOUS | Status: AC
  Filled 2018-05-24: qty 1

## 2018-05-24 MED ORDER — HYDRALAZINE HCL 20 MG/ML IJ SOLN
INTRAMUSCULAR | Status: AC
  Filled 2018-05-24: qty 1

## 2018-05-24 MED ORDER — CEFAZOLIN SODIUM-DEXTROSE 2-4 GM/100ML-% IV SOLN
INTRAVENOUS | Status: AC
  Filled 2018-05-24: qty 100

## 2018-05-24 NOTE — H&P (Signed)
Chief Complaint: Patient was seen in consultation today for thoracic 3 compression fracture.  Referring Physician(s): Ennever,Peter R  Supervising Physician: Luanne Bras  Patient Status: [facility] - Out-pt  History of Present Illness: [name] [name] is a 77 y.o. male with a past medical history of hypertension, hypercholesteremia, CAD, bradycardia, prostate cancer, diabetes mellitus type II, microalbuminuria, and allergic rhinitis. He was referred by Dr. Marin Olp to Dr. Estanislado Pandy for management of thoracic 3 compression fracture. He was seen in consultation with Dr. Estanislado Pandy 05/16/2018 when he decided to move forward with a vertebroplasty. It has been requested by Dr. Marin Olp that we also obtain a thoracic 3 bone biopsy at time of procedure.  Patient presents today for possible image-guided thoracic 3 vertebroplasty with thoracic 3 bone biopsy. Patient awake and alert sitting in bed. Accompanied by son at bedside. Complains of back pain, rated 1-2/10 at this time. Denies fever, chills, chest pain, dyspnea, abdominal pain, dizziness, headache, numbness/tingling down arms/legs, urinary symptoms including dysuria, or bladder/bowel incontinence.  Patient is currently taking Plavix- states last dose was Thursday 8/29.   Past Medical History:  Diagnosis Date  . Allergic rhinitis   . Bigeminy   . Bradycardia   . Bruit   . CAD (coronary artery disease)   . Claudication ([place])   . Diabetes mellitus type II   . Dyspnea   . History of prostate cancer    Dr. Gaynelle Arabian  . Hypercholesteremia    II A  . Hypertension   . Malignant neoplasm of prostate ([place])   . Microalbuminuria   . Seborrheic keratosis     Past Surgical History:  Procedure Laterality Date  . CORONARY ANGIOPLASTY WITH STENT PLACEMENT      Allergies: Patient has no known allergies.  Medications: Prior to Admission medications   Medication Sig Start Date End Date Taking? Authorizing Provider  albuterol (PROVENTIL  HFA;VENTOLIN HFA) 108 (90 Base) MCG/ACT inhaler INHALE 2 PUFFS INTO LUNGS EVERY 6 HOURS AS NEEDED FOR WHEEZING 04/15/18   Hali Marry, MD  amLODipine (NORVASC) 10 MG tablet Take 1 tablet (10 mg total) by mouth daily. Due for follow up visit w/PCP 05/17/18   Hali Marry, MD  [facility] ELLIPTA 62.5-25 MCG/INH AEPB INHALE 1 PUFF BY MOUTH EVERY DAY 05/09/18   Hali Marry, MD  aspirin EC 81 MG tablet Take 1 tablet (81 mg total) by mouth daily. 07/23/17   Josue Hector, MD  atorvastatin (LIPITOR) 40 MG tablet Take 1 tablet (40 mg total) by mouth daily. Due for follow up visit w/PCP 05/17/18   Hali Marry, MD  Blood Glucose Monitoring Suppl [facility] BLOOD GLUCOSE SYSTEM) DEVI Diagnoses - Diabetes E 11.8 Check blood sugar once daily 04/20/16   Hali Marry, MD  clopidogrel (PLAVIX) 75 MG tablet TAKE 1 TABLET BY MOUTH ONCE DAILY 12/20/17   Josue Hector, MD  doxazosin (CARDURA) 4 MG tablet TAKE 1 TABLET BY MOUTH AT BEDTIME 04/12/18   Hali Marry, MD  glipiZIDE (GLUCOTROL XL) 5 MG 24 hr tablet TAKE 1 TABLET(5 MG) BY MOUTH DAILY WITH BREAKFAST 12/09/17   Hali Marry, MD  glucose blood (BAYER CONTOUR TEST) test strip Diagnoses - Diabetes E 11.8 Check blood sugar once daily 04/20/16   Hali Marry, MD  hydrochlorothiazide (HYDRODIURIL) 25 MG tablet TAKE 1 TABLET BY MOUTH EVERY OTHER DAY 12/20/17   Josue Hector, MD  HYDROcodone-acetaminophen (NORCO/VICODIN) 5-325 MG tablet Take 1 tablet by mouth every 6 (six) hours as  needed for moderate pain. 04/28/18   Hali Marry, MD  losartan (COZAAR) 100 MG tablet TAKE 1 TABLET BY MOUTH ONCE DAILY 12/20/17   Josue Hector, MD  metFORMIN (GLUCOPHAGE-XR) 500 MG 24 hr tablet TAKE 2 TABLETS BY MOUTH ONCE DAILY WITH BREAKFAST 03/25/18   Hali Marry, MD  metoprolol succinate (TOPROL-XL) 100 MG 24 hr tablet TAKE 1 TABLET BY MOUTH EVERY DAY 03/16/18   Hali Marry, MD  oxyCODONE-acetaminophen  (PERCOCET) 10-325 MG tablet Take 1 tablet by mouth every 8 (eight) hours as needed for pain. 05/16/18   Silverio Decamp, MD  predniSONE (DELTASONE) 50 MG tablet One tab PO daily for 5 days. 04/29/18   Silverio Decamp, MD  PROAIR HFA 108 [phone] Base) MCG/ACT inhaler INHALE 2 PUFFS BY MOUTH INTO THE LUNGS EVERY 6 HOURS AS NEEDED FOR WHEEZING AS DIRECTED 09/27/17   Hali Marry, MD  tamsulosin (FLOMAX) 0.4 MG CAPS capsule Take 1 capsule (0.4 mg total) by mouth daily. 10/21/15   Hali Marry, MD  traMADol (ULTRAM) 50 MG tablet Take 2 tablets (100 mg total) by mouth 2 (two) times daily. 03/17/18   Hali Marry, MD  triamcinolone ointment (KENALOG) 0.5 % Apply 1 application topically at bedtime. 11/26/16   Hali Marry, MD     Family History  Problem Relation Age of Onset  . Heart attack Father   . Coronary artery disease Brother     Social History   Socioeconomic History  . Marital status: Widowed    Spouse name: Not on file  . Number of children: Not on file  . Years of education: Not on file  . Highest education level: Not on file  Occupational History  . Occupation: Retired but still works Dentist: RETIRED  Social Needs  . Financial resource strain: Not on file  . Food insecurity:    Worry: Not on file    Inability: Not on file  . Transportation needs:    Medical: Not on file    Non-medical: Not on file  Tobacco Use  . Smoking status: Former Smoker    Packs/day: 1.00    Years: 20.00    Pack years: 20.00    Last attempt to quit: 04/22/2003    Years since quitting: 15.0  . Smokeless tobacco: Never Used  Substance and Sexual Activity  . Alcohol use: Yes    Alcohol/week: 1.0 standard drinks    Types: 1 Standard drinks or equivalent per week  . Drug use: Not on file  . Sexual activity: Not on file  Lifestyle  . Physical activity:    Days per week: Not on file    Minutes per session: Not on file  . Stress: Not on file    Relationships  . Social connections:    Talks on phone: Not on file    Gets together: Not on file    Attends religious service: Not on file    Active member of club or organization: Not on file    Attends meetings of clubs or organizations: Not on file    Relationship status: Not on file  Other Topics Concern  . Not on file  Social History Narrative   Widowed 08/2009   Has 4 kids, 2 are local   Fair diet   No regular exercise     Review of Systems: A 12 point ROS discussed and pertinent positives are indicated in the HPI above.  All  other systems are negative.  Review of Systems  Constitutional: Negative for chills and fever.  Respiratory: Negative for shortness of breath and wheezing.   Cardiovascular: Negative for chest pain and palpitations.  Gastrointestinal: Negative for abdominal pain.       Negative for bowel incontinence.  Genitourinary: Negative for dysuria.       Negative for bladder incontinence.  Musculoskeletal: Positive for back pain.  Neurological: Negative for dizziness, numbness and headaches.  Psychiatric/Behavioral: Negative for behavioral problems and confusion.    Vital Signs: BP (!) 192/55   Pulse 70   Temp 98.6 F (37 C)   Resp 20   Ht 5\' 10"  (1.778 m)   Wt 180 lb (81.6 kg)   SpO2 100%   BMI 25.83 kg/m   Physical Exam  Constitutional: He is oriented to person, place, and time. He appears well-developed and well-nourished. No distress.  Cardiovascular: Normal rate, regular rhythm and normal heart sounds.  No murmur heard. Pulmonary/Chest: Effort normal and breath sounds normal. No respiratory distress. He has no wheezes.  Musculoskeletal:  Mild tenderness of midline spine at approximate level of thoracic 3.  Neurological: He is alert and oriented to person, place, and time.  Skin: Skin is warm and dry.  Psychiatric: He has a normal mood and affect. His behavior is normal. Judgment and thought content normal.  Nursing note and vitals  reviewed.    MD Evaluation Airway: WNL Heart: WNL Abdomen: WNL Chest/ Lungs: WNL ASA  Classification: 3 Mallampati/Airway Score: Two   Imaging: No results found.  Labs:  CBC: Recent Labs    10/01/17 1544 02/25/18 1201 04/29/18 1047 05/05/18 1525  WBC 8.6 8.3 6.8 10.2*  HGB 13.0* 13.6 12.9* 12.7*  HCT 39.1 39.7 39.9 40.3  PLT 213 162 237 224    COAGS: No results for input(s): INR, APTT in the last 8760 hours.  BMP: Recent Labs    10/01/17 1544 02/25/18 1201 02/28/18 1429 04/26/18 0900 04/29/18 1047 05/05/18 1525  NA 143 141 141 140 141 141  K 4.0 4.2 4.2 4.3 4.5 4.3  CL 107 107 106 103 104 103  CO2 30 25 26 25 26 29   GLUCOSE 83 104 139* 139* 128 97  BUN 27* 39* 36* 24 28* 38*  CALCIUM 9.4 10.0 9.8 9.1 9.8 9.9  CREATININE 1.10 1.43* 1.43* 1.04 1.11 1.50*  GFRNONAA 65 47* 47* 69  --   --   GFRAA 75 55* 55* 80  --   --     LIVER FUNCTION TESTS: Recent Labs    02/28/18 1429 04/26/18 0900 04/29/18 1047 05/05/18 1525  BILITOT 0.5 1.0 0.7 0.7  AST 14 24 25 25   ALT 13 25 25 31   ALKPHOS  --   --   --  115*  PROT 6.5 6.5 6.8  6.5 7.0  ALBUMIN  --   --   --  3.3*    TUMOR MARKERS: No results for input(s): AFPTM, CEA, CA199, CHROMGRNA in the last 8760 hours.  Assessment and Plan:  Thoracic 3 compression fracture. Plan for image-guided thoracic 3 vertebroplasty with thoracic 3 bone biopsy today with Dr. Estanislado Pandy. Patient is NPO. Denies fever. Patient is currently taking Plavix- states last dose was Thursday 8/29. INR pending.  Risks and benefits of thoracic 3 kyphoplasty/vertebroplasty were discussed with the patient including, but not limited to education regarding the natural healing process of compression fractures without intervention, bleeding, infection, cement migration which may cause spinal cord damage, paralysis, pulmonary embolism  or even death. This interventional procedure involves the use of X-rays and because of the nature of the  planned procedure, it is possible that we will have prolonged use of X-ray fluoroscopy. Potential radiation risks to you include (but are not limited to) the following: - A slightly elevated risk for cancer  several years later in life. This risk is typically less than 0.5% percent. This risk is low in comparison to the normal incidence of human cancer, which is 33% for women and 50% for men according to the [place]. - Radiation induced injury can include skin redness, resembling a rash, tissue breakdown / ulcers and hair loss (which can be temporary or permanent).  The likelihood of either of these occurring depends on the difficulty of the procedure and whether you are sensitive to radiation due to previous procedures, disease, or genetic conditions.  IF your procedure requires a prolonged use of radiation, you will be notified and given written instructions for further action.  It is your responsibility to monitor the irradiated area for the 2 weeks following the procedure and to notify your physician if you are concerned that you have suffered a radiation induced injury.   All of the patient's questions were answered, patient is agreeable to proceed. Consent signed and in chart.   Thank you for this interesting consult.  I greatly enjoyed meeting [name] [name] and look forward to participating in their care.  A copy of this report was sent to the requesting provider on this date.  Electronically Signed: Earley Abide, PA-C 05/24/2018, 10:06 AM   I spent a total of 20 Minutes in face to face in clinical consultation, greater than 50% of which was counseling/coordinating care for thoracic 3 compression fracture.

## 2018-05-24 NOTE — Progress Notes (Signed)
Patient ID: [name] [name], male   DOB: 09-29-1940, 77 y.o.   MRN: [id number] INR.  Patient scheduled for T 3 biopsy and vertebral  Body augmentation for pain relief. Under fluoroscopic exam poor visualization of the T3 vertebral body precluded performance of the  procedure. Procedure therefore aborted. D/W patient and sons. S.Giancarlos Berendt MD

## 2018-05-25 ENCOUNTER — Other Ambulatory Visit: Payer: Self-pay | Admitting: Radiology

## 2018-05-25 ENCOUNTER — Encounter (HOSPITAL_COMMUNITY): Payer: Self-pay | Admitting: Interventional Radiology

## 2018-05-26 ENCOUNTER — Telehealth: Payer: Self-pay

## 2018-05-26 ENCOUNTER — Other Ambulatory Visit: Payer: Self-pay | Admitting: Radiology

## 2018-05-26 ENCOUNTER — Other Ambulatory Visit: Payer: Self-pay | Admitting: Student

## 2018-05-26 NOTE — Telephone Encounter (Signed)
Keefe called and states he is in the donut hole and needs to switch his Anoro Ellipta to something we have samples of. Please advise.

## 2018-05-26 NOTE — Telephone Encounter (Signed)
Let's call rep and see if we can get samples of Anoro.  Let pt know we don't hae anything on hand but we will call rep and see what we can get.

## 2018-05-27 ENCOUNTER — Encounter (HOSPITAL_COMMUNITY): Payer: Self-pay

## 2018-05-27 ENCOUNTER — Ambulatory Visit (HOSPITAL_COMMUNITY)
Admission: RE | Admit: 2018-05-27 | Discharge: 2018-05-27 | Disposition: A | Discharge status: Home / Self Care | Payer: Medicare Other | Source: Ambulatory Visit | Attending: Hematology & Oncology | Admitting: Hematology & Oncology

## 2018-05-27 DIAGNOSIS — Z7951 Long term (current) use of inhaled steroids: Secondary | ICD-10-CM | POA: Insufficient documentation

## 2018-05-27 DIAGNOSIS — Z8546 Personal history of malignant neoplasm of prostate: Secondary | ICD-10-CM | POA: Diagnosis present

## 2018-05-27 DIAGNOSIS — S22030A Wedge compression fracture of third thoracic vertebra, initial encounter for closed fracture: Secondary | ICD-10-CM | POA: Diagnosis not present

## 2018-05-27 DIAGNOSIS — M899 Disorder of bone, unspecified: Secondary | ICD-10-CM | POA: Diagnosis not present

## 2018-05-27 DIAGNOSIS — E119 Type 2 diabetes mellitus without complications: Secondary | ICD-10-CM | POA: Insufficient documentation

## 2018-05-27 DIAGNOSIS — I10 Essential (primary) hypertension: Secondary | ICD-10-CM | POA: Diagnosis not present

## 2018-05-27 DIAGNOSIS — C7951 Secondary malignant neoplasm of bone: Secondary | ICD-10-CM | POA: Diagnosis not present

## 2018-05-27 DIAGNOSIS — Z7982 Long term (current) use of aspirin: Secondary | ICD-10-CM | POA: Diagnosis not present

## 2018-05-27 DIAGNOSIS — I2510 Atherosclerotic heart disease of native coronary artery without angina pectoris: Secondary | ICD-10-CM | POA: Insufficient documentation

## 2018-05-27 DIAGNOSIS — Z79899 Other long term (current) drug therapy: Secondary | ICD-10-CM | POA: Diagnosis not present

## 2018-05-27 DIAGNOSIS — Z87891 Personal history of nicotine dependence: Secondary | ICD-10-CM | POA: Insufficient documentation

## 2018-05-27 DIAGNOSIS — E7800 Pure hypercholesterolemia, unspecified: Secondary | ICD-10-CM | POA: Insufficient documentation

## 2018-05-27 DIAGNOSIS — Z7902 Long term (current) use of antithrombotics/antiplatelets: Secondary | ICD-10-CM | POA: Insufficient documentation

## 2018-05-27 DIAGNOSIS — Z955 Presence of coronary angioplasty implant and graft: Secondary | ICD-10-CM | POA: Insufficient documentation

## 2018-05-27 DIAGNOSIS — M4854XA Collapsed vertebra, not elsewhere classified, thoracic region, initial encounter for fracture: Secondary | ICD-10-CM | POA: Diagnosis not present

## 2018-05-27 DIAGNOSIS — Z7984 Long term (current) use of oral hypoglycemic drugs: Secondary | ICD-10-CM | POA: Diagnosis not present

## 2018-05-27 DIAGNOSIS — C801 Malignant (primary) neoplasm, unspecified: Secondary | ICD-10-CM | POA: Diagnosis not present

## 2018-05-27 LAB — CBC WITH DIFFERENTIAL/PLATELET
ABS IMMATURE GRANULOCYTES: 0 10*3/uL (ref 0.0–0.1)
BASOS ABS: 0.1 10*3/uL (ref 0.0–0.1)
Basophils Relative: 1 %
Eosinophils Absolute: 0.2 10*3/uL (ref 0.0–0.7)
Eosinophils Relative: 3 %
HCT: 43.1 % (ref 39.0–52.0)
HEMOGLOBIN: 13.7 g/dL (ref 13.0–17.0)
IMMATURE GRANULOCYTES: 1 %
LYMPHS PCT: 29 %
Lymphs Abs: 1.9 10*3/uL (ref 0.7–4.0)
MCH: 28.8 pg (ref 26.0–34.0)
MCHC: 31.8 g/dL (ref 30.0–36.0)
MCV: 90.7 fL (ref 78.0–100.0)
MONO ABS: 0.5 10*3/uL (ref 0.1–1.0)
Monocytes Relative: 7 %
NEUTROS ABS: 3.9 10*3/uL (ref 1.7–7.7)
NEUTROS PCT: 59 %
Platelets: 177 10*3/uL (ref 150–400)
RBC: 4.75 MIL/uL (ref 4.22–5.81)
RDW: 15.9 % — ABNORMAL HIGH (ref 11.5–15.5)
WBC: 6.5 10*3/uL (ref 4.0–10.5)

## 2018-05-27 LAB — GLUCOSE, CAPILLARY: Glucose-Capillary: 99 mg/dL (ref 70–99)

## 2018-05-27 MED ORDER — SODIUM CHLORIDE 0.9 % IV SOLN: INTRAVENOUS | Status: DC

## 2018-05-27 MED ORDER — MIDAZOLAM HCL 2 MG/2ML IJ SOLN
INTRAMUSCULAR | Status: AC | PRN
  Administered 2018-05-27 (×2): 1 mg via INTRAVENOUS

## 2018-05-27 MED ORDER — FENTANYL CITRATE (PF) 100 MCG/2ML IJ SOLN
INTRAMUSCULAR | Status: AC
  Filled 2018-05-27: qty 4

## 2018-05-27 MED ORDER — LIDOCAINE HCL 1 % IJ SOLN
INTRAMUSCULAR | Status: AC
  Filled 2018-05-27: qty 20

## 2018-05-27 MED ORDER — FENTANYL CITRATE (PF) 100 MCG/2ML IJ SOLN
INTRAMUSCULAR | Status: AC | PRN
  Administered 2018-05-27: 25 ug via INTRAVENOUS
  Administered 2018-05-27 (×2): 50 ug via INTRAVENOUS

## 2018-05-27 MED ORDER — MIDAZOLAM HCL 2 MG/2ML IJ SOLN
INTRAMUSCULAR | Status: AC
  Filled 2018-05-27: qty 4

## 2018-05-27 NOTE — Telephone Encounter (Signed)
Pt advised. He will get them on Monday.

## 2018-05-27 NOTE — Telephone Encounter (Signed)
Samples will be here today.

## 2018-05-27 NOTE — Procedures (Signed)
T3 bone biopsy 18 g times four EBL 0 Comp 0

## 2018-05-27 NOTE — Telephone Encounter (Signed)
Left VM for Rep Arbie Cookey: [phone]) to see if we can get some samples.

## 2018-05-27 NOTE — Telephone Encounter (Signed)
Left VM advising Pt that samples are ready for pick up

## 2018-05-27 NOTE — Discharge Instructions (Addendum)
°Needle Biopsy of the Bone, Care After °Refer to this sheet in the next few weeks. These instructions provide you with information about caring for yourself after your procedure. Your health care provider may also give you more specific instructions. Your treatment has been planned according to current medical practices, but problems sometimes occur. Call your health care provider if you have any problems or questions after your procedure. °What can I expect after the procedure? °After your procedure, it is common to have soreness or tenderness at the puncture site. °Follow these instructions at home: °· Take over-the-counter and prescription medicines only as told by your health care provider. °· Bathe and shower as told by your health care provider. °· Follow instructions from your health care provider about: °? How to take care of your puncture site. °? When and how you should change your bandage (dressing). °? When you should remove your dressing. °· Check your puncture site every day for signs of infection. Watch for: °? Redness, swelling, or worsening pain. °? Fluid, blood, or pus. °· Return to your normal activities as told by your health care provider. °· Keep all follow-up visits as told by your health care provider. This is important. °Contact a health care provider if: °· You have redness, swelling, or worsening pain at the site of your puncture. °· You have fluid, blood, or pus coming from your puncture site. °· You have a fever. °· You have persistent nausea or vomiting. °Get help right away if: °· You develop a rash. °· You have difficulty breathing. °This information is not intended to replace advice given to you by your health care provider. Make sure you discuss any questions you have with your health care provider. °Document Released: 03/27/2005 Document Revised: 02/13/2016 Document Reviewed: 10/15/2014 °Elsevier Interactive Patient Education © 2018 [employer] °Moderate Conscious Sedation,  Adult, Care After °These instructions provide you with information about caring for yourself after your procedure. Your health care provider may also give you more specific instructions. Your treatment has been planned according to current medical practices, but problems sometimes occur. Call your health care provider if you have any problems or questions after your procedure. °What can I expect after the procedure? °After your procedure, it is common: °· To feel sleepy for several hours. °· To feel clumsy and have poor balance for several hours. °· To have poor judgment for several hours. °· To vomit if you eat too soon. ° °Follow these instructions at home: °For at least 24 hours after the procedure: ° °· Do not: °? Participate in activities where you could fall or become injured. °? Drive. °? Use heavy machinery. °? Drink alcohol. °? Take sleeping pills or medicines that cause drowsiness. °? Make important decisions or sign legal documents. °? Take care of children on your own. °· Rest. °Eating and drinking °· Follow the diet recommended by your health care provider. °· If you vomit: °? Drink water, juice, or soup when you can drink without vomiting. °? Make sure you have little or no nausea before eating solid foods. °General instructions °· Have a responsible adult stay with you until you are awake and alert. °· Take over-the-counter and prescription medicines only as told by your health care provider. °· If you smoke, do not smoke without supervision. °· Keep all follow-up visits as told by your health care provider. This is important. °Contact a health care provider if: °· You keep feeling nauseous or you keep vomiting. °· You feel light-headed. °·   You develop a rash. °· You have a fever. °Get help right away if: °· You have trouble breathing. °This information is not intended to replace advice given to you by your health care provider. Make sure you discuss any questions you have with your health care  provider. °Document Released: 06/28/2013 Document Revised: 02/10/2016 Document Reviewed: 12/28/2015 °Elsevier Interactive Patient Education © 2018 [employer] ° °

## 2018-05-27 NOTE — H&P (Signed)
Chief Complaint: Patient was seen in consultation today for Thoracic #3 Biopsy at the request of Ennever,Peter R  Referring Physician(s): Ennever,Peter R  Supervising Physician: Marybelle Killings  Patient Status: [facility] - Out-pt  History of Present Illness: [name] [name] is a 77 y.o. male   Back pain x 2 mo Worsening pain-- pain meds no relief Hx prostate Ca Was seen in consultation with Dr Estanislado Pandy and scheduled for T3 Bx/VP MR 8/3:  IMPRESSION: Pathologic compression fracture of the T3 vertebral body with loss of height of 60%. Posterior bowing of the posterior margin of the vertebral body by 5-6 mm. This encroaches upon the canal but does not appear to physically compress the cord. Most likely diagnosis is myeloma. Metastatic disease could have a similar appearance. Abnormal signal also affects the anterior superior corner of the T4 vertebral body which could be involved by the same process. Alternatively, this could be bone marrow edema related to altered stress.   Pt was originally scheduled for T3 Bx and Vertebroplasty 05/24/18 Dr Estanislado Pandy was unable to proceed Could not get adequate visualization-- procedure stopped  Now rescheduled for T3 Bx in CT with Dr Barbie Banner Pt and son aware ONLY Biopsy will be performed today per Dr Barbie Banner They are agreeable   Past Medical History:  Diagnosis Date  . Allergic rhinitis   . Bigeminy   . Bradycardia   . Bruit   . CAD (coronary artery disease)   . Claudication ([place])   . Diabetes mellitus type II   . Dyspnea   . History of prostate cancer    Dr. Gaynelle Arabian  . Hypercholesteremia    II A  . Hypertension   . Malignant neoplasm of prostate ([place])   . Microalbuminuria   . Seborrheic keratosis     Past Surgical History:  Procedure Laterality Date  . CORONARY ANGIOPLASTY WITH STENT PLACEMENT    . IR FLUORO RM 30-60 MIN  05/24/2018    Allergies: Patient has no known allergies.  Medications: Prior to Admission medications     Medication Sig Start Date End Date Taking? Authorizing Provider  albuterol (PROVENTIL HFA;VENTOLIN HFA) 108 (90 Base) MCG/ACT inhaler INHALE 2 PUFFS INTO LUNGS EVERY 6 HOURS AS NEEDED FOR WHEEZING 04/15/18   Hali Marry, MD  amLODipine (NORVASC) 10 MG tablet Take 1 tablet (10 mg total) by mouth daily. Due for follow up visit w/PCP 05/17/18   Hali Marry, MD  [facility] ELLIPTA 62.5-25 MCG/INH AEPB INHALE 1 PUFF BY MOUTH EVERY DAY 05/09/18   Hali Marry, MD  aspirin EC 81 MG tablet Take 1 tablet (81 mg total) by mouth daily. 07/23/17   Josue Hector, MD  atorvastatin (LIPITOR) 40 MG tablet Take 1 tablet (40 mg total) by mouth daily. Due for follow up visit w/PCP 05/17/18   Hali Marry, MD  Blood Glucose Monitoring Suppl [facility] BLOOD GLUCOSE SYSTEM) DEVI Diagnoses - Diabetes E 11.8 Check blood sugar once daily 04/20/16   Hali Marry, MD  clopidogrel (PLAVIX) 75 MG tablet TAKE 1 TABLET BY MOUTH ONCE DAILY 12/20/17   Josue Hector, MD  doxazosin (CARDURA) 4 MG tablet TAKE 1 TABLET BY MOUTH AT BEDTIME 04/12/18   Hali Marry, MD  glipiZIDE (GLUCOTROL XL) 5 MG 24 hr tablet TAKE 1 TABLET(5 MG) BY MOUTH DAILY WITH BREAKFAST 12/09/17   Hali Marry, MD  glucose blood (BAYER CONTOUR TEST) test strip Diagnoses - Diabetes E 11.8 Check blood sugar once daily 04/20/16  Hali Marry, MD  hydrochlorothiazide (HYDRODIURIL) 25 MG tablet TAKE 1 TABLET BY MOUTH EVERY OTHER DAY 12/20/17   Josue Hector, MD  HYDROcodone-acetaminophen (NORCO/VICODIN) 5-325 MG tablet Take 1 tablet by mouth every 6 (six) hours as needed for moderate pain. 04/28/18   Hali Marry, MD  losartan (COZAAR) 100 MG tablet TAKE 1 TABLET BY MOUTH ONCE DAILY 12/20/17   Josue Hector, MD  metFORMIN (GLUCOPHAGE-XR) 500 MG 24 hr tablet TAKE 2 TABLETS BY MOUTH ONCE DAILY WITH BREAKFAST 03/25/18   Hali Marry, MD  metoprolol succinate (TOPROL-XL) 100 MG 24 hr tablet TAKE 1  TABLET BY MOUTH EVERY DAY 03/16/18   Hali Marry, MD  oxyCODONE-acetaminophen (PERCOCET) 10-325 MG tablet Take 1 tablet by mouth every 8 (eight) hours as needed for pain. 05/16/18   Silverio Decamp, MD  predniSONE (DELTASONE) 50 MG tablet One tab PO daily for 5 days. 04/29/18   Silverio Decamp, MD  PROAIR HFA 108 [phone] Base) MCG/ACT inhaler INHALE 2 PUFFS BY MOUTH INTO THE LUNGS EVERY 6 HOURS AS NEEDED FOR WHEEZING AS DIRECTED 09/27/17   Hali Marry, MD  tamsulosin (FLOMAX) 0.4 MG CAPS capsule Take 1 capsule (0.4 mg total) by mouth daily. 10/21/15   Hali Marry, MD  traMADol (ULTRAM) 50 MG tablet Take 2 tablets (100 mg total) by mouth 2 (two) times daily. 03/17/18   Hali Marry, MD  triamcinolone ointment (KENALOG) 0.5 % Apply 1 application topically at bedtime. 11/26/16   Hali Marry, MD     Family History  Problem Relation Age of Onset  . Heart attack Father   . Coronary artery disease Brother     Social History   Socioeconomic History  . Marital status: Widowed    Spouse name: Not on file  . Number of children: Not on file  . Years of education: Not on file  . Highest education level: Not on file  Occupational History  . Occupation: Retired but still works Dentist: RETIRED  Social Needs  . Financial resource strain: Not on file  . Food insecurity:    Worry: Not on file    Inability: Not on file  . Transportation needs:    Medical: Not on file    Non-medical: Not on file  Tobacco Use  . Smoking status: Former Smoker    Packs/day: 1.00    Years: 20.00    Pack years: 20.00    Last attempt to quit: 04/22/2003    Years since quitting: 15.1  . Smokeless tobacco: Never Used  Substance and Sexual Activity  . Alcohol use: Yes    Alcohol/week: 1.0 standard drinks    Types: 1 Standard drinks or equivalent per week  . Drug use: Not on file  . Sexual activity: Not on file  Lifestyle  . Physical activity:     Days per week: Not on file    Minutes per session: Not on file  . Stress: Not on file  Relationships  . Social connections:    Talks on phone: Not on file    Gets together: Not on file    Attends religious service: Not on file    Active member of club or organization: Not on file    Attends meetings of clubs or organizations: Not on file    Relationship status: Not on file  Other Topics Concern  . Not on file  Social History Narrative   Widowed 08/2009  Has 4 kids, 2 are local   Fair diet   No regular exercise    Review of Systems: A 12 point ROS discussed and pertinent positives are indicated in the HPI above.  All other systems are negative.  Review of Systems  Constitutional: Positive for activity change. Negative for fatigue, fever and unexpected weight change.  Respiratory: Negative for shortness of breath.   Cardiovascular: Negative for chest pain.  Gastrointestinal: Negative for abdominal pain.  Musculoskeletal: Positive for back pain and gait problem.  Neurological: Negative for weakness.  Psychiatric/Behavioral: Negative for behavioral problems and confusion.    Vital Signs: BP (!) 166/59   Pulse (!) 58   Temp 98 F (36.7 C)   Resp 18   Ht 5\' 10"  (1.778 m)   Wt 178 lb (80.7 kg)   SpO2 99%   BMI 25.54 kg/m   Physical Exam  Constitutional: He is oriented to person, place, and time.  Cardiovascular: Normal rate, regular rhythm and normal heart sounds.  Pulmonary/Chest: Effort normal and breath sounds normal.  Abdominal: Soft. Bowel sounds are normal.  Musculoskeletal: Normal range of motion.  Upper Back pain   Neurological: He is alert and oriented to person, place, and time.  Skin: Skin is warm and dry.  Psychiatric: He has a normal mood and affect. His behavior is normal. Judgment and thought content normal.  Nursing note and vitals reviewed.   Imaging: Ir Fluoro Rm 30-60 Min  Result Date: 05/25/2018 CLINICAL DATA:  Severe of the thoracic pain  secondary to of the compression fracture at T3 EXAM: IR FLOURO RM 0-60 MIN ANESTHESIA/SEDATION: None MEDICATIONS: Nine PROCEDURE: Nine COMPLICATIONS: None immediate FINDINGS: Under intermittent biplane fluoroscopy, the compressed T3 vertebral body visualize on the AP projection but not on the lateral projection Therefore the procedure was deemed too risky for biopsy and vertebral body augmentation using fluoroscopic guidance. The procedure was therefore aborted. IMPRESSION: See above Electronically Signed   By: Luanne Bras M.D.   On: 05/25/2018 09:07    Labs:  CBC: Recent Labs    04/29/18 1047 05/05/18 1525 05/24/18 0946 05/27/18 0928  WBC 6.8 10.2* 5.4 6.5  HGB 12.9* 12.7* 12.3* 13.7  HCT 39.9 40.3 40.5 43.1  PLT 237 224 152 177    COAGS: Recent Labs    05/24/18 0946  INR 0.97    BMP: Recent Labs    02/25/18 1201 02/28/18 1429 04/26/18 0900 04/29/18 1047 05/05/18 1525 05/24/18 0946  NA 141 141 140 141 141 142  K 4.2 4.2 4.3 4.5 4.3 3.9  CL 107 106 103 104 103 107  CO2 25 26 25 26 29 26   GLUCOSE 104 139* 139* 128 97 142*  BUN 39* 36* 24 28* 38* 24*  CALCIUM 10.0 9.8 9.1 9.8 9.9 9.6  CREATININE 1.43* 1.43* 1.04 1.11 1.50* 1.06  GFRNONAA 47* 47* 69  --   --  >60  GFRAA 55* 55* 80  --   --  >60    LIVER FUNCTION TESTS: Recent Labs    02/28/18 1429 04/26/18 0900 04/29/18 1047 05/05/18 1525  BILITOT 0.5 1.0 0.7 0.7  AST 14 24 25 25   ALT 13 25 25 31   ALKPHOS  --   --   --  115*  PROT 6.5 6.5 6.8  6.5 7.0  ALBUMIN  --   --   --  3.3*    TUMOR MARKERS: No results for input(s): AFPTM, CEA, CA199, CHROMGRNA in the last 8760 hours.  Assessment and  Plan:  T 3 painful fx Unsuccessful attempt at Bx and VP last week Re attempt at T3 Biopsy scheduled for today in CT Risks and benefits discussed with the patient including, but not limited to bleeding, infection, damage to adjacent structures or low yield requiring additional tests.  All of the patient's  questions were answered, patient is agreeable to proceed. Consent signed and in chart.  Thank you for this interesting consult.  I greatly enjoyed meeting JAMIEN CASANOVA and look forward to participating in their care.  A copy of this report was sent to the requesting provider on this date.  Electronically Signed: Lavonia Drafts, PA-C 05/27/2018, 10:32 AM   I spent a total of    25 Minutes in face to face in clinical consultation, greater than 50% of which was counseling/coordinating care for T 3 Bx

## 2018-05-30 ENCOUNTER — Other Ambulatory Visit: Payer: Self-pay

## 2018-05-30 DIAGNOSIS — M4850XA Collapsed vertebra, not elsewhere classified, site unspecified, initial encounter for fracture: Secondary | ICD-10-CM

## 2018-05-30 MED ORDER — OXYCODONE-ACETAMINOPHEN 10-325 MG PO TABS: 1.0000 | ORAL_TABLET | Freq: Three times a day (TID) | ORAL | 0 refills | Status: DC | PRN

## 2018-05-30 NOTE — Telephone Encounter (Signed)
Trini called for a refill on oxycodone. He is early for a refill but he states the oxycodone only helps slightly. Please advise.

## 2018-05-30 NOTE — Telephone Encounter (Signed)
As he does have severe pain related to metastatic cancer I am happy to refill this early.

## 2018-06-02 ENCOUNTER — Inpatient Hospital Stay: Payer: Medicare Other | Attending: Hematology & Oncology | Admitting: Hematology & Oncology

## 2018-06-02 ENCOUNTER — Other Ambulatory Visit: Payer: Self-pay

## 2018-06-02 ENCOUNTER — Encounter: Payer: Self-pay | Admitting: Hematology & Oncology

## 2018-06-02 VITALS — BP 162/93 | HR 58 | Temp 98.6°F | Resp 18 | Wt 175.0 lb

## 2018-06-02 DIAGNOSIS — C641 Malignant neoplasm of right kidney, except renal pelvis: Secondary | ICD-10-CM

## 2018-06-02 DIAGNOSIS — Z8546 Personal history of malignant neoplasm of prostate: Secondary | ICD-10-CM

## 2018-06-02 DIAGNOSIS — M5489 Other dorsalgia: Secondary | ICD-10-CM | POA: Diagnosis not present

## 2018-06-02 DIAGNOSIS — C7951 Secondary malignant neoplasm of bone: Secondary | ICD-10-CM | POA: Diagnosis not present

## 2018-06-02 DIAGNOSIS — M546 Pain in thoracic spine: Secondary | ICD-10-CM

## 2018-06-02 DIAGNOSIS — Z7189 Other specified counseling: Secondary | ICD-10-CM

## 2018-06-02 HISTORY — DX: Malignant neoplasm of right kidney, except renal pelvis: C64.1

## 2018-06-02 HISTORY — DX: Other specified counseling: Z71.89

## 2018-06-02 MED ORDER — HYDROCODONE-ACETAMINOPHEN 5-325 MG PO TABS: 1.0000 | ORAL_TABLET | Freq: Four times a day (QID) | ORAL | 0 refills | Status: DC | PRN

## 2018-06-02 NOTE — Progress Notes (Signed)
Hematology and Oncology Follow Up Visit  [name] [name] [id number] [birth year] 77 y.o. 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  We finally have a diagnosis.  We set him up for a kyphoplasty/biopsy of the T3 lesion.  However, kyphoplasty cannot be done.  Finally, a biopsy was obtained.  This was done on 05/27/2018.  The pathology report ([id number]) showed metastatic clear cell carcinoma of the kidney.  I am somewhat surprised by this.  I am not sure where the primary lesion is in his kidneys.  In looking back on his past CT scans, he had cysts in the right kidney 4 years ago.  He is having significant pain in the upper back.  We need to go ahead and see about radiation therapy to the lesion.  I am not sure if this could be treated with kyphoplasty.  I will speak to 1 of the spine surgeons.  To see if they feel that they might be able to do a kyphoplasty.  Did speak with Dr. Teryl Lucy of radiation oncology for an appointment to be seen.  I am sure [name] [name] will also benefit from [place].  He will clearly need CT scans of the chest/abdomen and pelvis.  He will need a MRI of the brain.  He is having a lot of pain.  Of course, he was only given 30 pain pills.  I will take over his pain medication ordering.  He is not have any problems with nausea or vomiting.  Is had no cough or shortness of breath.  He has had no fever.  There is been no bleeding.  He is still working.  He sells rope.  Overall, his performance status is ECOG 1.  Medications:  Current Outpatient Medications:  .  albuterol (PROVENTIL HFA;VENTOLIN HFA) 108 (90 Base) MCG/ACT inhaler, INHALE 2 PUFFS INTO LUNGS EVERY 6 HOURS AS NEEDED FOR WHEEZING, Disp: 8.5 g, Rfl: 0 .  amLODipine (NORVASC) 10 MG tablet, Take 1 tablet (10 mg total) by mouth daily. Due for follow up visit w/PCP,  Disp: 30 tablet, Rfl: 0 .  ANORO ELLIPTA 62.5-25 MCG/INH AEPB, INHALE 1 PUFF BY MOUTH EVERY DAY, Disp: 60 each, Rfl: 3 .  aspirin EC 81 MG tablet, Take 1 tablet (81 mg total) by mouth daily., Disp: , Rfl:  .  atorvastatin (LIPITOR) 40 MG tablet, Take 1 tablet (40 mg total) by mouth daily. Due for follow up visit w/PCP, Disp: 30 tablet, Rfl: 0 .  Blood Glucose Monitoring Suppl (CONTOUR BLOOD GLUCOSE SYSTEM) DEVI, Diagnoses - Diabetes E 11.8 Check blood sugar once daily, Disp: 1 Device, Rfl: 0 .  clopidogrel (PLAVIX) 75 MG tablet, TAKE 1 TABLET BY MOUTH ONCE DAILY, Disp: 90 tablet, Rfl: 3 .  doxazosin (CARDURA) 4 MG tablet, TAKE 1 TABLET BY MOUTH AT BEDTIME, Disp: 90 tablet, Rfl: 0 .  glipiZIDE (GLUCOTROL XL) 5 MG 24 hr tablet, TAKE 1 TABLET(5 MG) BY MOUTH DAILY WITH BREAKFAST, Disp: 90 tablet, Rfl: 1 .  glucose blood (BAYER CONTOUR TEST) test strip, Diagnoses - Diabetes E 11.8 Check blood sugar once daily, Disp: 100 each, Rfl: prn .  hydrochlorothiazide (HYDRODIURIL) 25 MG tablet, TAKE 1 TABLET BY MOUTH EVERY OTHER DAY, Disp: 45 tablet, Rfl: 3 .  HYDROcodone-acetaminophen (NORCO/VICODIN) 5-325 MG tablet, Take 1-2 tablets by mouth every 6 (six) hours as  needed for moderate pain., Disp: 60 tablet, Rfl: 0 .  losartan (COZAAR) 100 MG tablet, TAKE 1 TABLET BY MOUTH ONCE DAILY, Disp: 90 tablet, Rfl: 3 .  metFORMIN (GLUCOPHAGE-XR) 500 MG 24 hr tablet, TAKE 2 TABLETS BY MOUTH ONCE DAILY WITH BREAKFAST, Disp: 180 tablet, Rfl: 0 .  metoprolol succinate (TOPROL-XL) 100 MG 24 hr tablet, TAKE 1 TABLET BY MOUTH EVERY DAY, Disp: 90 tablet, Rfl: 0 .  oxyCODONE-acetaminophen (PERCOCET) 10-325 MG tablet, Take 1 tablet by mouth every 8 (eight) hours as needed for pain., Disp: 60 tablet, Rfl: 0 .  predniSONE (DELTASONE) 50 MG tablet, One tab PO daily for 5 days., Disp: 5 tablet, Rfl: 0 .  PROAIR HFA 108 (90 Base) MCG/ACT inhaler, INHALE 2 PUFFS BY MOUTH INTO THE LUNGS EVERY 6 HOURS AS NEEDED FOR WHEEZING AS DIRECTED,  Disp: 8.5 g, Rfl: 2 .  tamsulosin (FLOMAX) 0.4 MG CAPS capsule, Take 1 capsule (0.4 mg total) by mouth daily., Disp: 90 capsule, Rfl: 3 .  traMADol (ULTRAM) 50 MG tablet, Take 2 tablets (100 mg total) by mouth 2 (two) times daily., Disp: 120 tablet, Rfl: 1 .  triamcinolone ointment (KENALOG) 0.5 %, Apply 1 application topically at bedtime., Disp: 45 g, Rfl: 0  Allergies: No Known Allergies  Past Medical History, Surgical history, Social history, and Family History were reviewed and updated.  Review of Systems: Review of Systems  Constitutional: Negative.   HENT:  Negative.   Eyes: Negative.   Respiratory: Negative.   Cardiovascular: Negative.   Gastrointestinal: Negative.   Endocrine: Negative.   Genitourinary: Negative.    Musculoskeletal: Positive for back pain.  Skin: Negative.   Neurological: Negative.   Hematological: Negative.   Psychiatric/Behavioral: Negative.     Physical Exam:  weight is 175 lb (79.4 kg). His oral temperature is 98.6 F (37 C). His blood pressure is 162/93 (abnormal) and his pulse is 58 (abnormal). His respiration is 18 and oxygen saturation is 96%.   Wt Readings from Last 3 Encounters:  06/02/18 175 lb (79.4 kg)  05/27/18 178 lb (80.7 kg)  05/24/18 180 lb (81.6 kg)    Physical Exam  Constitutional: He is oriented to person, place, and time.  HENT:  Head: Normocephalic and atraumatic.  Mouth/Throat: Oropharynx is clear and moist.  Eyes: Pupils are equal, round, and reactive to light. EOM are normal.  Neck: Normal range of motion.  Cardiovascular: Normal rate, regular rhythm and normal heart sounds.  Pulmonary/Chest: Effort normal and breath sounds normal.  Abdominal: Soft. Bowel sounds are normal.  Musculoskeletal: Normal range of motion. He exhibits no edema, tenderness or deformity.  Lymphadenopathy:    He has no cervical adenopathy.  Neurological: He is alert and oriented to person, place, and time.  Skin: Skin is warm and dry. No rash  noted. No erythema.  Psychiatric: He has a normal mood and affect. His behavior is normal. Judgment and thought content normal.  Vitals reviewed.    Lab Results  Component Value Date   WBC 6.5 05/27/2018   HGB 13.7 05/27/2018   HCT 43.1 05/27/2018   MCV 90.7 05/27/2018   PLT 177 05/27/2018     Chemistry      Component Value Date/Time   NA 142 05/24/2018 0946   K 3.9 05/24/2018 0946   CL 107 05/24/2018 0946   CO2 26 05/24/2018 0946   BUN 24 (H) 05/24/2018 0946   CREATININE 1.06 05/24/2018 0946   CREATININE 1.50 (H) 05/05/2018 1525   CREATININE 1.11  04/29/2018 1047      Component Value Date/Time   CALCIUM 9.6 05/24/2018 0946   ALKPHOS 115 (H) 05/05/2018 1525   AST 25 05/05/2018 1525   ALT 31 05/05/2018 1525   BILITOT 0.7 05/05/2018 1525       Impression and Plan: [name] [name] is a 77 year old white male.  He has a past history of prostate cancer.  Apparently there is also a history of melanoma in situ.  Every now has metastatic kidney cancer.  I did send off the specimen for molecular profiling.  Hopefully, we will find that he has a PD-L1 level.  As far as systemic therapy is concerned, this will be dictated by his molecular analysis.  We could utilize immunotherapy alone or immunotherapy along with targeted therapy.  He has a good performance status so we definitely have flexibility with our treatment options.  I spent about 45 minutes with he and his son.  All the time spent face-to-face.  I counseled them.  I help coordinate future appointments.  I answered all their questions.   [name], MD 9/12/20194:55 PM

## 2018-06-03 ENCOUNTER — Encounter: Payer: Self-pay | Admitting: Radiation Oncology

## 2018-06-03 NOTE — Progress Notes (Signed)
Histology and Location of Primary Cancer: Per pathology 05/27/18:  Diagnosis Bone, biopsy, T3 vertebrae - METASTATIC CARCINOMA - SEE COMMENT Microscopic Comment The bone biopsy shows a neoplastic proliferation of clear cells. By immunohistochemistry, the neoplastic cells are positive for PAX8 but negative for prostein, PSA, cytokeratin 7, cytokeratin 20 and cytokeratin 5/6. Overall, the morphology and phenotype are consistent with metastatic clear cell renal cell carcinoma.  Sites of Visceral and Bony Metastatic Disease: T3  Location(s) of Symptomatic Metastases: Per CT 04/23/18: IMPRESSION: Pathologic compression fracture of the T3 vertebral body with loss of height of 60%. Posterior bowing of the posterior margin of the vertebral body by 5-6 mm. This encroaches upon the canal but does not appear to physically compress the cord. Most likely diagnosis is myeloma. Metastatic disease could have a similar appearance. Abnormal signal also affects the anterior superior corner of the T4 vertebral body which could be involved by the same process. Alternatively, this could be bone marrow edema related to altered stress.   Past/Anticipated chemotherapy by medical oncology, if any: Per Dr. Marin Olp 06/02/18:  Impression and Plan: Mr. Shughart is a 77 year old white male.  He has a past history of prostate cancer.  Apparently there is also a history of melanoma in situ.  Every now has metastatic kidney cancer.  I did send off the specimen for molecular profiling.  Hopefully, we will find that he has a PD-L1 level.  As far as systemic therapy is concerned, this will be dictated by his molecular analysis.  We could utilize immunotherapy alone or immunotherapy along with targeted therapy.  He has a good performance status so we definitely have flexibility with our treatment options.  Pain on a scale of 0-10 is: Pt reports pain in bilateral shoulders, 3/10   If Spine Met(s), symptoms, if any,  include:  Bowel/Bladder retention or incontinence (please describe): nocturia, every 1 hour at night  Numbness or weakness in extremities (please describe): No  Current Decadron regimen, if applicable: None  Ambulatory status? Walker? Wheelchair?: Ambulatory  SAFETY ISSUES:  Prior radiation? NO (prostate CA 10-12 years ago)  Pacemaker/ICD? No  Possible current pregnancy? N/A  Is the patient on methotrexate? No  Current Complaints / other details:  Pt presents today for initial consult with Dr. Sondra Come for Radiation Oncology. Pt is accompanied by son. Pt states he "hurt his leg trying to put on a pair of jeans yesterday". Pt scored a 5/10 on distress screening. SW consult placed per protocol.   Loma Sousa, RN BSN

## 2018-06-06 ENCOUNTER — Encounter: Payer: Self-pay | Admitting: Radiation Oncology

## 2018-06-06 ENCOUNTER — Ambulatory Visit
Admission: RE | Admit: 2018-06-06 | Discharge: 2018-06-06 | Disposition: A | Discharge status: Home / Self Care | Payer: Medicare Other | Source: Ambulatory Visit | Attending: Radiation Oncology | Admitting: Radiation Oncology

## 2018-06-06 ENCOUNTER — Other Ambulatory Visit: Payer: Self-pay

## 2018-06-06 VITALS — BP 180/62 | HR 62 | Temp 99.0°F | Resp 18 | Ht 70.0 in | Wt 174.6 lb

## 2018-06-06 DIAGNOSIS — Z79899 Other long term (current) drug therapy: Secondary | ICD-10-CM | POA: Insufficient documentation

## 2018-06-06 DIAGNOSIS — C641 Malignant neoplasm of right kidney, except renal pelvis: Secondary | ICD-10-CM | POA: Diagnosis not present

## 2018-06-06 DIAGNOSIS — Z87891 Personal history of nicotine dependence: Secondary | ICD-10-CM | POA: Diagnosis not present

## 2018-06-06 DIAGNOSIS — C7951 Secondary malignant neoplasm of bone: Secondary | ICD-10-CM | POA: Insufficient documentation

## 2018-06-06 DIAGNOSIS — Z955 Presence of coronary angioplasty implant and graft: Secondary | ICD-10-CM | POA: Insufficient documentation

## 2018-06-06 DIAGNOSIS — Z7982 Long term (current) use of aspirin: Secondary | ICD-10-CM | POA: Insufficient documentation

## 2018-06-06 DIAGNOSIS — Z8546 Personal history of malignant neoplasm of prostate: Secondary | ICD-10-CM | POA: Insufficient documentation

## 2018-06-06 DIAGNOSIS — Z7902 Long term (current) use of antithrombotics/antiplatelets: Secondary | ICD-10-CM | POA: Diagnosis not present

## 2018-06-06 DIAGNOSIS — M4850XA Collapsed vertebra, not elsewhere classified, site unspecified, initial encounter for fracture: Secondary | ICD-10-CM

## 2018-06-06 DIAGNOSIS — E119 Type 2 diabetes mellitus without complications: Secondary | ICD-10-CM | POA: Diagnosis not present

## 2018-06-06 DIAGNOSIS — I2510 Atherosclerotic heart disease of native coronary artery without angina pectoris: Secondary | ICD-10-CM | POA: Insufficient documentation

## 2018-06-06 DIAGNOSIS — Z7984 Long term (current) use of oral hypoglycemic drugs: Secondary | ICD-10-CM | POA: Diagnosis not present

## 2018-06-06 MED ORDER — OXYCODONE-ACETAMINOPHEN 10-325 MG PO TABS: 1.0000 | ORAL_TABLET | ORAL | 0 refills | Status: DC | PRN

## 2018-06-06 NOTE — Progress Notes (Addendum)
Radiation Oncology         [phone] ________________________________  Initial Outpatient Consultation  Name: [name] [name] MRN: [id number]  Date: 06/06/2018  DOB: 24-May-1941  NI:[id number], Rene Kocher, MD  [name], MD   REFERRING PHYSICIAN: [name], MD  DIAGNOSIS: The primary encounter diagnosis was Kidney cancer, primary, with metastasis from kidney to other site, right [facility]). A diagnosis of Pathological compression fracture of vertebra, initial encounter [facility]) was also pertinent to this visit.   stage IV clear-cell renal cell carcinoma  HISTORY OF PRESENT ILLNESS::[name] [name] is a 77 y.o. male who is seen out of the courtesy of Dr. Burney Gauze for an opinion concerning radiation therapy for management of patient's recently diagnosed metastatic clear cell renal cell carcinoma to the T3 spine. Patient recently presented with upper back pain. He ultimately underwent a CT scan of the chest which revealed a lytic lesion with significant compression of the T3 vertebral body. Patient underwent a MRI which confirmed a pathologic compression fracture of T3 with approximately 60% loss of height of this vertebral body. There was encroachment upon the spinal canal but no cord compression A subsequent biopsy was performed which revealed clear cell renal cell carcinoma. Staging scans have been ordered for the patient. He has been evaluated by interventional radiology and is not a candidate for or vertebroplasty due to poor visualization.  PREVIOUS RADIATION THERAPY: Yes , patient has a prior history of prostate cancer undergoing radioactive seed implant with Dr. Carolan Clines and radiation oncology. The patient does not recall his radiation oncologist at this time.  PAST MEDICAL HISTORY:  has a past medical history of Allergic rhinitis, Bigeminy, Bradycardia, Bruit, CAD (coronary artery disease), Claudication ([place]), Diabetes mellitus type II, Dyspnea, Goals of care,  counseling/discussion (06/02/2018), History of prostate cancer, Hypercholesteremia, Hypertension, Kidney cancer, primary, with metastasis from kidney to other site, right [facility]) (06/02/2018), Malignant neoplasm of prostate ([place]), Microalbuminuria, and Seborrheic keratosis.    PAST SURGICAL HISTORY: Past Surgical History:  Procedure Laterality Date  . CORONARY ANGIOPLASTY WITH STENT PLACEMENT    . IR FLUORO RM 30-60 MIN  05/24/2018    FAMILY HISTORY: family history includes Coronary artery disease in his brother; Heart attack in his father.  SOCIAL HISTORY:  reports that he quit smoking about 15 years ago. He has a 20.00 pack-year smoking history. He has never used smokeless tobacco. He reports that he drinks about 1.0 standard drinks of alcohol per week.  ALLERGIES: Patient has no known allergies.  MEDICATIONS:  Current Outpatient Medications  Medication Sig Dispense Refill  . albuterol (PROVENTIL HFA;VENTOLIN HFA) 108 (90 Base) MCG/ACT inhaler INHALE 2 PUFFS INTO LUNGS EVERY 6 HOURS AS NEEDED FOR WHEEZING 8.5 g 0  . amLODipine (NORVASC) 10 MG tablet Take 1 tablet (10 mg total) by mouth daily. Due for follow up visit w/PCP 30 tablet 0  . ANORO ELLIPTA 62.5-25 MCG/INH AEPB INHALE 1 PUFF BY MOUTH EVERY DAY 60 each 3  . aspirin EC 81 MG tablet Take 1 tablet (81 mg total) by mouth daily.    Marland Kitchen atorvastatin (LIPITOR) 40 MG tablet Take 1 tablet (40 mg total) by mouth daily. Due for follow up visit w/PCP 30 tablet 0  . Blood Glucose Monitoring Suppl (CONTOUR BLOOD GLUCOSE SYSTEM) DEVI Diagnoses - Diabetes E 11.8 Check blood sugar once daily 1 Device 0  . clopidogrel (PLAVIX) 75 MG tablet TAKE 1 TABLET BY MOUTH ONCE DAILY 90 tablet 3  . doxazosin (CARDURA) 4 MG  tablet TAKE 1 TABLET BY MOUTH AT BEDTIME 90 tablet 0  . glipiZIDE (GLUCOTROL XL) 5 MG 24 hr tablet TAKE 1 TABLET(5 MG) BY MOUTH DAILY WITH BREAKFAST 90 tablet 1  . glucose blood (BAYER CONTOUR TEST) test strip Diagnoses - Diabetes E 11.8 Check  blood sugar once daily 100 each prn  . hydrochlorothiazide (HYDRODIURIL) 25 MG tablet TAKE 1 TABLET BY MOUTH EVERY OTHER DAY 45 tablet 3  . HYDROcodone-acetaminophen (NORCO/VICODIN) 5-325 MG tablet Take 1-2 tablets by mouth every 6 (six) hours as needed for moderate pain. 60 tablet 0  . losartan (COZAAR) 100 MG tablet TAKE 1 TABLET BY MOUTH ONCE DAILY 90 tablet 3  . metFORMIN (GLUCOPHAGE-XR) 500 MG 24 hr tablet TAKE 2 TABLETS BY MOUTH ONCE DAILY WITH BREAKFAST 180 tablet 0  . metoprolol succinate (TOPROL-XL) 100 MG 24 hr tablet TAKE 1 TABLET BY MOUTH EVERY DAY 90 tablet 0  . oxyCODONE-acetaminophen (PERCOCET) 10-325 MG tablet Take 1 tablet by mouth every 4 (four) hours as needed for pain. 30 tablet 0  . PROAIR HFA 108 (90 Base) MCG/ACT inhaler INHALE 2 PUFFS BY MOUTH INTO THE LUNGS EVERY 6 HOURS AS NEEDED FOR WHEEZING AS DIRECTED 8.5 g 2  . tamsulosin (FLOMAX) 0.4 MG CAPS capsule Take 1 capsule (0.4 mg total) by mouth daily. 90 capsule 3  . traMADol (ULTRAM) 50 MG tablet Take 2 tablets (100 mg total) by mouth 2 (two) times daily. 120 tablet 1  . triamcinolone ointment (KENALOG) 0.5 % Apply 1 application topically at bedtime. 45 g 0  . predniSONE (DELTASONE) 50 MG tablet One tab PO daily for 5 days. (Patient not taking: Reported on 06/06/2018) 5 tablet 0   No current facility-administered medications for this encounter.     REVIEW OF SYSTEMS: REVIEW OF SYSTEMS: A 10+ POINT REVIEW OF SYSTEMS WAS OBTAINED including neurology, dermatology, psychiatry, cardiac, respiratory, lymph, extremities, GI, GU, musculoskeletal, constitutional, reproductive, HEENT. All pertinent positives are noted in the HPI. All others are negative. He patient awakens every hour due to urination issues as well as back pain He denies any numbness or tingling in his lower extremities. The patient denies any bowel or bladder incontinence. He denies any weakness in his lower extremities. He does have difficulty ambulating this time  what appears to be a left groin muscle pull.  PHYSICAL EXAM:  height is 5\' 10"  (1.778 m) and weight is 174 lb 9.6 oz (79.2 kg). His oral temperature is 99 F (37.2 C). His blood pressure is 180/62 (abnormal) and his pulse is 62. His respiration is 18 and oxygen saturation is 97%.   General: Alert and oriented, in no acute distress HEENT: Head is normocephalic. Extraocular movements are intact. Oropharynx is clear. Neck: Neck is supple, no palpable cervical or supraclavicular lymphadenopathy. Heart: Regular in rate and rhythm with no murmurs, rubs, or gallops. Chest: Clear to auscultation bilaterally, with no rhonchi, wheezes, or rales. Some tenderness with palpation in the upper thoracic spine region. Abdomen: Soft, nontender, nondistended, with no rigidity or guarding. Extremities: No cyanosis or edema. Lymphatics: see Neck Exam Skin: No concerning lesions. Musculoskeletal: symmetric strength and muscle tone throughout. Neurologic: Cranial nerves II through XII are grossly intact. No obvious focalities. Speech is fluent. Coordination is intact. Psychiatric: Judgment and insight are intact. Affect is appropriate.     ECOG = 1  0 - Asymptomatic (Fully active, able to carry on all predisease activities without restriction)  1 - Symptomatic but completely ambulatory (Restricted in physically strenuous activity  but ambulatory and able to carry out work of a light or sedentary nature. For example, light housework, office work)  2 - Symptomatic, <50% in bed during the day (Ambulatory and capable of all self care but unable to carry out any work activities. Up and about more than 50% of waking hours)  3 - Symptomatic, >50% in bed, but not bedbound (Capable of only limited self-care, confined to bed or chair 50% or more of waking hours)  4 - Bedbound (Completely disabled. Cannot carry on any self-care. Totally confined to bed or chair)  5 - Death   Eustace Pen MM, Creech RH, Tormey DC, et al.  [phone]). "Toxicity and response criteria of the [facility]". Wall Oncol. 5 (6): 649-55  LABORATORY DATA:  Lab Results  Component Value Date   WBC 6.5 05/27/2018   HGB 13.7 05/27/2018   HCT 43.1 05/27/2018   MCV 90.7 05/27/2018   PLT 177 05/27/2018   NEUTROABS 3.9 05/27/2018   Lab Results  Component Value Date   NA 142 05/24/2018   K 3.9 05/24/2018   CL 107 05/24/2018   CO2 26 05/24/2018   GLUCOSE 142 (H) 05/24/2018   CREATININE 1.06 05/24/2018   CALCIUM 9.6 05/24/2018      RADIOGRAPHY: Ir Fluoro Rm 30-60 Min  Result Date: 05/25/2018 CLINICAL DATA:  Severe of the thoracic pain secondary to of the compression fracture at T3 EXAM: IR FLOURO RM 0-60 MIN ANESTHESIA/SEDATION: None MEDICATIONS: Nine PROCEDURE: Nine COMPLICATIONS: None immediate FINDINGS: Under intermittent biplane fluoroscopy, the compressed T3 vertebral body visualize on the AP projection but not on the lateral projection Therefore the procedure was deemed too risky for biopsy and vertebral body augmentation using fluoroscopic guidance. The procedure was therefore aborted. IMPRESSION: See above Electronically Signed   By: Luanne Bras M.D.   On: 05/25/2018 09:07   Ct Biopsy  Result Date: 05/27/2018 INDICATION: T3 destructive bone lesion EXAM: CT BIOPSY MEDICATIONS: None. ANESTHESIA/SEDATION: Fentanyl 125 mcg IV; Versed 2 mg IV Moderate Sedation Time:  15 minutes The patient was continuously monitored during the procedure by the interventional radiology nurse under my direct supervision. FLUOROSCOPY TIME:  Fluoroscopy Time:  minutes  seconds ( mGy). COMPLICATIONS: None immediate. PROCEDURE: Informed written consent was obtained from the patient after a thorough discussion of the procedural risks, benefits and alternatives. All questions were addressed. Maximal Sterile Barrier Technique was utilized including caps, mask, sterile gowns, sterile gloves, sterile drape, hand hygiene and skin  antiseptic. A timeout was performed prior to the initiation of the procedure. Under CT guidance, a(n) 17 gauge guide needle was advanced into the T3 bone lesion via left paraspinal approach. Subsequently 4 18 gauge core biopsies were obtained. The guide needle was removed. Post biopsy images demonstrate no hemorrhage. Patient tolerated the procedure well without complication. Vital sign monitoring by nursing staff during the procedure will continue as patient is in the special procedures unit for post procedure observation. FINDINGS: The images document guide needle placement within the T3 bone lesion. Post biopsy images demonstrate no hemorrhage. IMPRESSION: Successful CT-guided core biopsy of a T3 bone lesion. Electronically Signed   By: Marybelle Killings M.D.   On: 05/27/2018 13:29      IMPRESSION: stage IV clear-cell renal cell carcinoma. Patient is quite from his metastasis in the upper thoracic spine area. He would be a good candidate for palliative radiation therapy. The patient's imaging was reviewed with Dr. Kyung Rudd and he feels the patient would be a  good candidate for SBRT or SRS to this area which may provide better results given the inconsistency's with renal cell carcinoma and conventional palliative radiation therapy  PLAN:patient will proceed with consultation with the [facility] team in the near future.  Marland Kitchen   ------------------------------------------------  Blair Promise, PhD, MD

## 2018-06-07 ENCOUNTER — Encounter: Payer: Self-pay | Admitting: General Practice

## 2018-06-07 ENCOUNTER — Other Ambulatory Visit: Payer: Self-pay | Admitting: Radiation Therapy

## 2018-06-07 DIAGNOSIS — C7951 Secondary malignant neoplasm of bone: Secondary | ICD-10-CM

## 2018-06-07 NOTE — Progress Notes (Signed)
[facility] Psychosocial Distress Screening Clinical Social Work  Clinical Social Work was referred by distress screening protocol.  The patient scored a 5 on the Psychosocial Distress Thermometer which indicates moderate distress. Clinical Social Worker contacted patient by phone to assess for distress and other psychosocial needs. No answer, left generic VM w information about Support Center services, CSW contact information and encouragement to call back for more information if desired.    ONCBCN DISTRESS SCREENING 06/06/2018  Screening Type Initial Screening  Distress experienced in past week (1-10) 5  Physical Problem type Pain;Getting around    Clinical Social Worker follow up needed: No.  If yes, follow up plan:  Beverely Pace, Lynn, LCSW Clinical Social Worker Phone:  [phone]

## 2018-06-08 ENCOUNTER — Ambulatory Visit (HOSPITAL_BASED_OUTPATIENT_CLINIC_OR_DEPARTMENT_OTHER)
Admission: RE | Admit: 2018-06-08 | Discharge: 2018-06-08 | Disposition: A | Discharge status: Home / Self Care | Payer: Medicare Other | Source: Ambulatory Visit | Attending: Hematology & Oncology | Admitting: Hematology & Oncology

## 2018-06-08 ENCOUNTER — Other Ambulatory Visit: Payer: Self-pay | Admitting: Hematology & Oncology

## 2018-06-08 ENCOUNTER — Encounter (HOSPITAL_BASED_OUTPATIENT_CLINIC_OR_DEPARTMENT_OTHER): Payer: Self-pay

## 2018-06-08 ENCOUNTER — Other Ambulatory Visit: Payer: Self-pay | Admitting: Radiation Oncology

## 2018-06-08 DIAGNOSIS — C641 Malignant neoplasm of right kidney, except renal pelvis: Secondary | ICD-10-CM

## 2018-06-08 DIAGNOSIS — C649 Malignant neoplasm of unspecified kidney, except renal pelvis: Secondary | ICD-10-CM | POA: Diagnosis not present

## 2018-06-08 DIAGNOSIS — C7951 Secondary malignant neoplasm of bone: Secondary | ICD-10-CM | POA: Insufficient documentation

## 2018-06-08 MED ORDER — HYDROMORPHONE HCL 2 MG PO TABS: 2.0000 mg | ORAL_TABLET | ORAL | 0 refills | Status: DC | PRN

## 2018-06-08 MED ORDER — IOPAMIDOL (ISOVUE-300) INJECTION 61%
100.0000 mL | Freq: Once | INTRAVENOUS | Status: AC | PRN
  Administered 2018-06-08: 100 mL via INTRAVENOUS

## 2018-06-10 ENCOUNTER — Other Ambulatory Visit: Payer: Self-pay | Admitting: Thoracic Surgery (Cardiothoracic Vascular Surgery)

## 2018-06-12 ENCOUNTER — Inpatient Hospital Stay: Admission: RE | Admit: 2018-06-12 | Payer: Medicare Other | Source: Ambulatory Visit

## 2018-06-13 NOTE — Progress Notes (Signed)
Histology and Location of Primary Cancer: Stage IV clear-cell renal cell carcinoma.  Metastasis to the upper thoracic spine- T3.  Patient presented with significant pain in the upper back.  CT CAP 06/08/2018:  Lungs: benign 29m granuloma RUL, Mild subpleural nodularity in the left upper lobe measuring 2-3 mm, unchanged. Musculoskeletal: Severe pathologic compression fracture deformity at T3 with abnormal soft tissue corresponding to known metastatic renal cell carcinoma extending posteriorly into the spinal canal.  Abnormal soft tissue extends to the anterior/superior corner of the T4 vertebra; body, suggesting T4 mets.  2.9 cm lytic/soft tissue lesion along the posterior column of the left acetabulum, suspicious for metastasis.   Adrenals: 2.5 x 3.5 x 4.3 cm solid renal mass in the posterior right lower kidney.  Additional right renal cysts and a 9 mm hyperdense/hemorrhagic lesion in the lateral right lower pole.  Sites of Visceral and Bony Metastatic Disease: T3, possible T4, possible left acetabulum.  Biopsy of T3 lesion 05/27/2018   Past/Anticipated chemotherapy by medical oncology, if any:  Dr. EMarin Olp9/08/2018 -We set him up for a kyphoplasty/biopsy of the T3 lesion.  Kyphoplasty cannot be done.  Biopsy done. -I am not sure where the primary lesion is in his kidneys.  In looking back on his past CT scans, he had cysts in the right kidney 4 years ago. -We need to go ahead and see about radiation therapy to the lesion.  I am not sure if this could be treated with kyphoplasty, I will speak to one of the spine surgeons. -I am sure Mr. HCanoywill also benefit from xgeva. -I did send off the specimen for molecular profiling. -As far as systemic therapy is concerned, this will be dictated by his molecular analysis.  We could utilize immunotherapy alone or immunotherapy along with targeted therapy.   Pain on a scale of 0-10 is: 10/10 at its worst   If Spine Met(s), symptoms, if any,  include:  Bowel/Bladder retention or incontinence (please describe):   Numbness or weakness in extremities (please describe):   Current Decadron regimen, if applicable:   Ambulatory status? Walker? Wheelchair?: No  BP (!) 160/60 (BP Location: Left Arm, Patient Position: Sitting)   Pulse 65   Temp 98.5 F (36.9 C) (Oral)   Resp 20   Ht '5\' 10"'  (1.778 m)   Wt 173 lb 12.8 oz (78.8 kg)   SpO2 95%   BMI 24.94 kg/m    Wt Readings from Last 3 Encounters:  06/14/18 173 lb 12.8 oz (78.8 kg)  06/06/18 174 lb 9.6 oz (79.2 kg)  06/02/18 175 lb (79.4 kg)    SAFETY ISSUES:  Prior radiation? Prostate- patient has a prior history of prostate cancer undergoing radioactive seed implant with Dr. SCarolan Clinesand radiation oncology.  Pacemaker/ICD? No  Possible current pregnancy? No  Is the patient on methotrexate? No  Current Complaints / other details:   -Status post ORIF of the left proximal femur- metal rod in leg, many years ago.  Left lesser trochanter fracture with associated 2.8 cm lytic/soft tissue lesion, suspicious for mets. -Past prostate cancer -History of melanoma in situ

## 2018-06-14 ENCOUNTER — Ambulatory Visit
Admission: RE | Admit: 2018-06-14 | Discharge: 2018-06-14 | Disposition: A | Discharge status: Home / Self Care | Payer: Medicare Other | Source: Ambulatory Visit | Attending: Radiation Oncology | Admitting: Radiation Oncology

## 2018-06-14 ENCOUNTER — Encounter (HOSPITAL_COMMUNITY): Payer: Self-pay | Admitting: Hematology & Oncology

## 2018-06-14 ENCOUNTER — Telehealth (HOSPITAL_COMMUNITY): Payer: Self-pay

## 2018-06-14 VITALS — BP 160/60 | HR 65 | Temp 98.5°F | Resp 20 | Ht 70.0 in | Wt 173.8 lb

## 2018-06-14 DIAGNOSIS — M4850XA Collapsed vertebra, not elsewhere classified, site unspecified, initial encounter for fracture: Secondary | ICD-10-CM

## 2018-06-14 DIAGNOSIS — Z87891 Personal history of nicotine dependence: Secondary | ICD-10-CM | POA: Insufficient documentation

## 2018-06-14 DIAGNOSIS — I2510 Atherosclerotic heart disease of native coronary artery without angina pectoris: Secondary | ICD-10-CM | POA: Diagnosis not present

## 2018-06-14 DIAGNOSIS — E119 Type 2 diabetes mellitus without complications: Secondary | ICD-10-CM | POA: Diagnosis not present

## 2018-06-14 DIAGNOSIS — Z85528 Personal history of other malignant neoplasm of kidney: Secondary | ICD-10-CM | POA: Insufficient documentation

## 2018-06-14 DIAGNOSIS — C641 Malignant neoplasm of right kidney, except renal pelvis: Secondary | ICD-10-CM | POA: Insufficient documentation

## 2018-06-14 DIAGNOSIS — Z7902 Long term (current) use of antithrombotics/antiplatelets: Secondary | ICD-10-CM | POA: Diagnosis not present

## 2018-06-14 DIAGNOSIS — Z7984 Long term (current) use of oral hypoglycemic drugs: Secondary | ICD-10-CM | POA: Diagnosis not present

## 2018-06-14 DIAGNOSIS — Z923 Personal history of irradiation: Secondary | ICD-10-CM | POA: Diagnosis not present

## 2018-06-14 DIAGNOSIS — Z7982 Long term (current) use of aspirin: Secondary | ICD-10-CM | POA: Insufficient documentation

## 2018-06-14 DIAGNOSIS — Z79899 Other long term (current) drug therapy: Secondary | ICD-10-CM | POA: Diagnosis not present

## 2018-06-14 DIAGNOSIS — Z955 Presence of coronary angioplasty implant and graft: Secondary | ICD-10-CM | POA: Diagnosis not present

## 2018-06-14 DIAGNOSIS — Z510 Encounter for antineoplastic radiation therapy: Secondary | ICD-10-CM | POA: Diagnosis not present

## 2018-06-14 DIAGNOSIS — G893 Neoplasm related pain (acute) (chronic): Secondary | ICD-10-CM | POA: Diagnosis not present

## 2018-06-14 DIAGNOSIS — C7951 Secondary malignant neoplasm of bone: Secondary | ICD-10-CM | POA: Diagnosis not present

## 2018-06-14 MED ORDER — OXYCODONE HCL 5 MG PO TABS: 5.0000 mg | ORAL_TABLET | ORAL | 0 refills | Status: DC | PRN

## 2018-06-14 NOTE — Telephone Encounter (Signed)
Called to reschedule biopsy, no answer, left vm. AW  

## 2018-06-15 ENCOUNTER — Other Ambulatory Visit: Payer: Self-pay | Admitting: Radiology

## 2018-06-15 ENCOUNTER — Encounter (HOSPITAL_COMMUNITY): Payer: Self-pay | Admitting: Hematology & Oncology

## 2018-06-15 DIAGNOSIS — G893 Neoplasm related pain (acute) (chronic): Secondary | ICD-10-CM

## 2018-06-15 DIAGNOSIS — C7951 Secondary malignant neoplasm of bone: Secondary | ICD-10-CM | POA: Insufficient documentation

## 2018-06-15 NOTE — Progress Notes (Signed)
Radiation Oncology         [phone] ________________________________  Name: [name] [name]        MRN: [id number]  Date of Service: 06/14/2018 DOB: 15-Aug-1941  DV:[id number], Rene Kocher, MD  Gery Pray, MD     REFERRING PHYSICIAN: Gery Pray, MD   DIAGNOSIS: The primary encounter diagnosis was Secondary malignant neoplasm of bone ([place]). Diagnoses of Kidney cancer, primary, with metastasis from kidney to other site, right [facility]), Pathological compression fracture of vertebra, initial encounter [facility]), and Pain from bone metastases [facility]) were also pertinent to this visit.   HISTORY OF PRESENT ILLNESS: [name] [name] is a 77 y.o. male seen at the request of Dr. 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 The patient has a history of renal cyst on the right kidney seen on prior ultrasound and CT imaging which at the time was felt to be benign. He presented with significant thoracic back pain and plain films on 03/23/18 of the thoracic spine were negative for any acute or malignant appearing concerns. He continued to have symptoms and a CT chest without contrast on 04/23/18 revealed a pathologic compression fracture at T3 with associated compression fracture and 50% height loss, the process extends 6 mm posteriorly into the spinal canal with apparent focal narrowing and apparent focal effacement of the anterior CSF space with possible cord compression, and extension into the anterior aspect of T4. There were also several nodules in the RUL measuring 4 mm, 5 mm in RML, 6 mm in LLL, and tiny subplural nodules in the RLL. He had an MRI of the thoracic spine on 04/23/18 that revealed pathologic compression fracture of the T3 vertebral body with 60% height loss, posterior bowing of the posterior margin of the vertebral body by 5-6 mm. This encroaches upon the canal but is not compressing the  cord and there is abnormal signal affecting the anterior superior corner of T4. He underwent CT biopsy of the bone on 05/27/18 and this revealed clear cell carcinoma of the kidney. He also had  CT imaging on 06/08/18 of the chest with contrast and of the abdomen and pelvis. It confirmed the findings of the T3 and T4 disease, as well as a 4.3 cm mass in the posterior right kidney concerning for solid neoplasm, and osseous metastases in the left acetabulum measuring 2.9 cm, and in the left lesser trochanter with associated post ORIF changes in the proximal left femur. He comes today to discuss options of treatment. He met with Dr. Sondra Come, but was felt to be a candidate for SRS to the spine, and comes to discuss this with Dr. Lisbeth Renshaw.     PREVIOUS RADIATION THERAPY: Yes  Prior radioactive seed implant for prostate cancer with Dr. Gaynelle Arabian, and cannot recall his radiation oncologist.   PAST MEDICAL HISTORY:  Past Medical History:  Diagnosis Date  . Allergic rhinitis   . Bigeminy   . Bradycardia   . Bruit   . CAD (coronary artery disease)   . Claudication ([place])   . Diabetes mellitus type II   . Dyspnea   . Goals of care, counseling/discussion 06/02/2018  . History of prostate cancer    Dr. Gaynelle Arabian  . Hypercholesteremia    II A  . Hypertension   . Kidney cancer, primary, with metastasis from kidney to other site, right ([place]) 06/02/2018  . Malignant neoplasm  of prostate ([place])   . Microalbuminuria   . Seborrheic keratosis        PAST SURGICAL HISTORY: Past Surgical History:  Procedure Laterality Date  . CORONARY ANGIOPLASTY WITH STENT PLACEMENT    . IR FLUORO RM 30-60 MIN  05/24/2018     FAMILY HISTORY:  Family History  Problem Relation Age of Onset  . Heart attack Father   . Coronary artery disease Brother      SOCIAL HISTORY:  reports that he quit smoking about 15 years ago. He has a 20.00 pack-year smoking history. He has never used smokeless tobacco. He reports that he drinks  about 1.0 standard drinks of alcohol per week. The patient is widowed and accompanied by his son. 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 He enjoys traveling.  ALLERGIES: Patient has no known allergies.   MEDICATIONS:  Current Outpatient Medications  Medication Sig Dispense Refill  . albuterol (PROVENTIL HFA;VENTOLIN HFA) 108 (90 Base) MCG/ACT inhaler INHALE 2 PUFFS INTO LUNGS EVERY 6 HOURS AS NEEDED FOR WHEEZING (Patient taking differently: Inhale 2 puffs into the lungs every 6 (six) hours as needed. ) 8.5 g 0  . amLODipine (NORVASC) 10 MG tablet Take 1 tablet (10 mg total) by mouth daily. Due for follow up visit w/PCP 30 tablet 0  . ANORO ELLIPTA 62.5-25 MCG/INH AEPB INHALE 1 PUFF BY MOUTH EVERY DAY (Patient taking differently: Inhale 1 puff into the lungs daily. ) 60 each 3  . aspirin EC 81 MG tablet Take 1 tablet (81 mg total) by mouth daily.    Marland Kitchen atorvastatin (LIPITOR) 40 MG tablet Take 1 tablet (40 mg total) by mouth daily. Due for follow up visit w/PCP 30 tablet 0  . Blood Glucose Monitoring Suppl (CONTOUR BLOOD GLUCOSE SYSTEM) DEVI Diagnoses - Diabetes E 11.8 Check blood sugar once daily 1 Device 0  . doxazosin (CARDURA) 4 MG tablet TAKE 1 TABLET BY MOUTH AT BEDTIME 90 tablet 0  . glipiZIDE (GLUCOTROL XL) 5 MG 24 hr tablet TAKE 1 TABLET(5 MG) BY MOUTH DAILY WITH BREAKFAST (Patient taking differently: Take 5 mg by mouth daily with breakfast. ) 90 tablet 1  . glucose blood (BAYER CONTOUR TEST) test strip Diagnoses - Diabetes E 11.8 Check blood sugar once daily 100 each prn  . hydrochlorothiazide (HYDRODIURIL) 25 MG tablet TAKE 1 TABLET BY MOUTH EVERY OTHER DAY 45 tablet 3  . HYDROcodone-acetaminophen (NORCO/VICODIN) 5-325 MG tablet Take 1-2 tablets by mouth every 6 (six) hours as needed for moderate pain. 60 tablet 0  . losartan (COZAAR) 100 MG tablet TAKE 1 TABLET BY MOUTH ONCE DAILY 90 tablet 3  . metFORMIN (GLUCOPHAGE-XR) 500 MG 24 hr tablet TAKE 2 TABLETS BY MOUTH ONCE  DAILY WITH BREAKFAST (Patient taking differently: Take 1,000 mg by mouth daily with breakfast. take 2 tablets by mouth once daily with BREAKFAST) 180 tablet 0  . metoprolol succinate (TOPROL-XL) 100 MG 24 hr tablet TAKE 1 TABLET BY MOUTH EVERY DAY 90 tablet 0  . Multiple Vitamins-Minerals (MULTIVITAMIN WITH MINERALS) tablet Take 1 tablet by mouth daily.    Marland Kitchen oxyCODONE-acetaminophen (PERCOCET) 10-325 MG tablet Take 1 tablet by mouth every 4 (four) hours as needed for pain. 30 tablet 0  . tamsulosin (FLOMAX) 0.4 MG CAPS capsule Take 1 capsule (0.4 mg total) by mouth daily. 90 capsule 3  . clopidogrel (PLAVIX) 75 MG tablet TAKE 1 TABLET BY MOUTH ONCE DAILY (Patient not taking: Reported on 06/14/2018) 90 tablet 3  . HYDROmorphone (DILAUDID)  2 MG tablet Take 1 tablet (2 mg total) by mouth every 4 (four) hours as needed for severe pain. (Patient not taking: Reported on 06/13/2018) 30 tablet 0  . oxyCODONE (OXY IR/ROXICODONE) 5 MG immediate release tablet Take 1-2 tablets (5-10 mg total) by mouth every 4 (four) hours as needed for severe pain. 120 tablet 0   No current facility-administered medications for this encounter.      REVIEW OF SYSTEMS: On review of systems, the patient reports that he is doing well overall. He denies any chest pain, shortness of breath, cough, fevers, chills, night sweats, unintended weight changes. He does complain of pain in the scapula that radiates around the flanks to the anterior chest. He is unable to get good relief with norco, but has had improvement with oxycodone. He was given dilaudid but is not taking this at this time. He denies any numbness or tingling of his torso. He denies any concerns with pain in his left hip. He denies any bowel or bladder disturbances, and denies abdominal pain, nausea or vomiting. He denies any other musculoskeletal or joint aches or pains. A complete review of systems is obtained and is otherwise negative.     PHYSICAL EXAM:  Wt Readings  from Last 3 Encounters:  06/14/18 173 lb 12.8 oz (78.8 kg)  06/06/18 174 lb 9.6 oz (79.2 kg)  06/02/18 175 lb (79.4 kg)   Temp Readings from Last 3 Encounters:  06/14/18 98.5 F (36.9 C) (Oral)  06/06/18 99 F (37.2 C) (Oral)  06/02/18 98.6 F (37 C) (Oral)   BP Readings from Last 3 Encounters:  06/14/18 (!) 160/60  06/06/18 (!) 180/62  06/02/18 (!) 162/93   Pulse Readings from Last 3 Encounters:  06/14/18 65  06/06/18 62  06/02/18 (!) 58   Pain Assessment Pain Score: 10-Worst pain ever Pain Frequency: Intermittent Pain Loc: Back(Left leg, across thoracic back.)/10  In general this is a well appearing caucasian male in no acute distress. He is alert and oriented x4 and appropriate throughout the examination. HEENT reveals that the patient is normocephalic, atraumatic. EOMs are intact. Skin is intact without any evidence of gross lesions. Cardiopulmonary assessment is negative for acute distress and he exhibits normal effort. He does not have any focal neurologic findings grossly.    ECOG = 1  0 - Asymptomatic (Fully active, able to carry on all predisease activities without restriction)  1 - Symptomatic but completely ambulatory (Restricted in physically strenuous activity but ambulatory and able to carry out work of a light or sedentary nature. For example, light housework, office work)  2 - Symptomatic, <50% in bed during the day (Ambulatory and capable of all self care but unable to carry out any work activities. Up and about more than 50% of waking hours)  3 - Symptomatic, >50% in bed, but not bedbound (Capable of only limited self-care, confined to bed or chair 50% or more of waking hours)  4 - Bedbound (Completely disabled. Cannot carry on any self-care. Totally confined to bed or chair)  5 - Death   Eustace Pen MM, Creech RH, Tormey DC, et al. [phone]). "Toxicity and response criteria of the [employer]". [place] Oncol. 5 (6):  649-55    LABORATORY DATA:  Lab Results  Component Value Date   WBC 6.5 05/27/2018   HGB 13.7 05/27/2018   HCT 43.1 05/27/2018   MCV 90.7 05/27/2018   PLT 177 05/27/2018   Lab Results  Component Value Date  NA 142 05/24/2018   K 3.9 05/24/2018   CL 107 05/24/2018   CO2 26 05/24/2018   Lab Results  Component Value Date   ALT 31 05/05/2018   AST 25 05/05/2018   ALKPHOS 115 (H) 05/05/2018   BILITOT 0.7 05/05/2018      RADIOGRAPHY: Ct Chest W Contrast  Result Date: 06/08/2018 CLINICAL DATA:  Stage IV renal cell cancer EXAM: CT CHEST, ABDOMEN, AND PELVIS WITH CONTRAST TECHNIQUE: Multidetector CT imaging of the chest, abdomen and pelvis was performed following the standard protocol during bolus administration of intravenous contrast. CONTRAST:  12m ISOVUE-300 IOPAMIDOL (ISOVUE-300) INJECTION 61% COMPARISON:  CT chest dated 04/21/2018. CT abdomen/pelvis dated 02/23/2014. FINDINGS: CT CHEST FINDINGS Cardiovascular: The heart is normal in size. No pericardial effusion. No evidence of thoracic aortic aneurysm. Atherosclerotic calcifications of the aortic arch. Three vessel coronary atherosclerosis. Mediastinum/Nodes: Small mediastinal lymph nodes, including a 9 mm short axis subcarinal node (series 2/image 32), previously 11 mm. Visualized thyroid is unremarkable. Lungs/Pleura: 4 mm calcified granuloma in the right upper lobe (series 4/image 39), benign. Mild subpleural nodularity in the left upper lobe, measuring 2-3 mm (series 4/images 30 and 44), unchanged. Biapical pleural-parenchymal scarring. Mild centrilobular and paraseptal emphysematous changes, upper lobe predominant. No focal consolidation. No pleural effusion or pneumothorax. Musculoskeletal: Severe pathologic compression fracture deformity at T3, with abnormal soft tissue corresponding to known metastatic renal cell carcinoma, extending posteriorly into the spinal canal (series 2/image 13). Abnormal soft tissue extends to the  anterior/superior corner of the T4 vertebral body (sagittal image 120), suggesting additional T4 metastasis. Degenerative changes of the thoracic spine. CT ABDOMEN PELVIS FINDINGS Hepatobiliary: Liver is within normal limits. Cholelithiasis, without associated inflammatory changes. No intrahepatic or extrahepatic ductal dilatation. Pancreas: Within normal limits. Spleen: Within normal limits. Adrenals/Urinary Tract: Adrenal glands are within normal limits. 2.5 x 3.5 x 4.3 cm solid renal mass in the posterior right lower kidney (series 7/image 25), most conspicuous on delayed imaging. Additional right renal cysts, including 4.5 cm simple interpolar cyst (series 2/image 77) and a 9 mm hyperdense/hemorrhagic lesion in the lateral right lower pole (series 2/image 34). Left kidney is within normal limits.  No hydronephrosis. Bladder is underdistended but unremarkable. Stomach/Bowel: Stomach is within normal limits. No evidence of bowel obstruction. Normal appendix (series 2/image 98). Sigmoid diverticulosis, without evidence of diverticulitis. Vascular/Lymphatic: No evidence of abdominal aortic aneurysm. Fusiform ectasia of the infrarenal abdominal aorta, measuring up to 2.5 cm. Atherosclerotic calcifications of the abdominal aorta and branch vessels. No suspicious abdominopelvic lymphadenopathy. Reproductive: Brachytherapy seeds in the prostate. Other: No abdominopelvic ascites. Musculoskeletal: Degenerative changes of the lumbar spine. 2.9 cm lytic/soft tissue lesion along the posterior column of the left acetabulum (series 2/image 113), suspicious for metastasis. Status post ORIF of the left proximal femur. Left lesser trochanter fracture with associated 2.8 cm lytic/soft tissue lesion (series 2/image 130), suspicious for metastasis. IMPRESSION: 4.3 cm solid mass in the posterior right lower kidney, highly suspicious for solid renal neoplasm in this patient with known metastatic clear cell renal cell carcinoma.  Biopsy-proven metastasis at T3 with severe pathologic compression fracture and extension into the spinal canal. Suspected extension/involvement of the T4 vertebral body, as above. Additional osseous metastasis involving the posterior column of the left acetabulum and the left lesser trochanter. Status post ORIF of the left proximal femur. Additional ancillary findings as above. Electronically Signed   By: SJulian HyM.D.   On: 06/08/2018 11:54   Ct Abdomen Pelvis W Contrast  Result Date: 06/08/2018  CLINICAL DATA:  Stage IV renal cell cancer EXAM: CT CHEST, ABDOMEN, AND PELVIS WITH CONTRAST TECHNIQUE: Multidetector CT imaging of the chest, abdomen and pelvis was performed following the standard protocol during bolus administration of intravenous contrast. CONTRAST:  19m ISOVUE-300 IOPAMIDOL (ISOVUE-300) INJECTION 61% COMPARISON:  CT chest dated 04/21/2018. CT abdomen/pelvis dated 02/23/2014. FINDINGS: CT CHEST FINDINGS Cardiovascular: The heart is normal in size. No pericardial effusion. No evidence of thoracic aortic aneurysm. Atherosclerotic calcifications of the aortic arch. Three vessel coronary atherosclerosis. Mediastinum/Nodes: Small mediastinal lymph nodes, including a 9 mm short axis subcarinal node (series 2/image 32), previously 11 mm. Visualized thyroid is unremarkable. Lungs/Pleura: 4 mm calcified granuloma in the right upper lobe (series 4/image 39), benign. Mild subpleural nodularity in the left upper lobe, measuring 2-3 mm (series 4/images 30 and 44), unchanged. Biapical pleural-parenchymal scarring. Mild centrilobular and paraseptal emphysematous changes, upper lobe predominant. No focal consolidation. No pleural effusion or pneumothorax. Musculoskeletal: Severe pathologic compression fracture deformity at T3, with abnormal soft tissue corresponding to known metastatic renal cell carcinoma, extending posteriorly into the spinal canal (series 2/image 13). Abnormal soft tissue extends to  the anterior/superior corner of the T4 vertebral body (sagittal image 120), suggesting additional T4 metastasis. Degenerative changes of the thoracic spine. CT ABDOMEN PELVIS FINDINGS Hepatobiliary: Liver is within normal limits. Cholelithiasis, without associated inflammatory changes. No intrahepatic or extrahepatic ductal dilatation. Pancreas: Within normal limits. Spleen: Within normal limits. Adrenals/Urinary Tract: Adrenal glands are within normal limits. 2.5 x 3.5 x 4.3 cm solid renal mass in the posterior right lower kidney (series 7/image 25), most conspicuous on delayed imaging. Additional right renal cysts, including 4.5 cm simple interpolar cyst (series 2/image 77) and a 9 mm hyperdense/hemorrhagic lesion in the lateral right lower pole (series 2/image 34). Left kidney is within normal limits.  No hydronephrosis. Bladder is underdistended but unremarkable. Stomach/Bowel: Stomach is within normal limits. No evidence of bowel obstruction. Normal appendix (series 2/image 98). Sigmoid diverticulosis, without evidence of diverticulitis. Vascular/Lymphatic: No evidence of abdominal aortic aneurysm. Fusiform ectasia of the infrarenal abdominal aorta, measuring up to 2.5 cm. Atherosclerotic calcifications of the abdominal aorta and branch vessels. No suspicious abdominopelvic lymphadenopathy. Reproductive: Brachytherapy seeds in the prostate. Other: No abdominopelvic ascites. Musculoskeletal: Degenerative changes of the lumbar spine. 2.9 cm lytic/soft tissue lesion along the posterior column of the left acetabulum (series 2/image 113), suspicious for metastasis. Status post ORIF of the left proximal femur. Left lesser trochanter fracture with associated 2.8 cm lytic/soft tissue lesion (series 2/image 130), suspicious for metastasis. IMPRESSION: 4.3 cm solid mass in the posterior right lower kidney, highly suspicious for solid renal neoplasm in this patient with known metastatic clear cell renal cell carcinoma.  Biopsy-proven metastasis at T3 with severe pathologic compression fracture and extension into the spinal canal. Suspected extension/involvement of the T4 vertebral body, as above. Additional osseous metastasis involving the posterior column of the left acetabulum and the left lesser trochanter. Status post ORIF of the left proximal femur. Additional ancillary findings as above. Electronically Signed   By: SJulian HyM.D.   On: 06/08/2018 11:54   Ir Fluoro Rm 30-60 Min  Result Date: 05/25/2018 CLINICAL DATA:  Severe of the thoracic pain secondary to of the compression fracture at T3 EXAM: IR FLOURO RM 0-60 MIN ANESTHESIA/SEDATION: None MEDICATIONS: Nine PROCEDURE: Nine COMPLICATIONS: None immediate FINDINGS: Under intermittent biplane fluoroscopy, the compressed T3 vertebral body visualize on the AP projection but not on the lateral projection Therefore the procedure was deemed too risky for biopsy  and vertebral body augmentation using fluoroscopic guidance. The procedure was therefore aborted. IMPRESSION: See above Electronically Signed   By: Luanne Bras M.D.   On: 05/25/2018 09:07   Ct Biopsy  Result Date: 05/27/2018 INDICATION: T3 destructive bone lesion EXAM: CT BIOPSY MEDICATIONS: None. ANESTHESIA/SEDATION: Fentanyl 125 mcg IV; Versed 2 mg IV Moderate Sedation Time:  15 minutes The patient was continuously monitored during the procedure by the interventional radiology nurse under my direct supervision. FLUOROSCOPY TIME:  Fluoroscopy Time:  minutes  seconds ( mGy). COMPLICATIONS: None immediate. PROCEDURE: Informed written consent was obtained from the patient after a thorough discussion of the procedural risks, benefits and alternatives. All questions were addressed. Maximal Sterile Barrier Technique was utilized including caps, mask, sterile gowns, sterile gloves, sterile drape, hand hygiene and skin antiseptic. A timeout was performed prior to the initiation of the procedure. Under CT  guidance, a(n) 17 gauge guide needle was advanced into the T3 bone lesion via left paraspinal approach. Subsequently 4 18 gauge core biopsies were obtained. The guide needle was removed. Post biopsy images demonstrate no hemorrhage. Patient tolerated the procedure well without complication. Vital sign monitoring by nursing staff during the procedure will continue as patient is in the special procedures unit for post procedure observation. FINDINGS: The images document guide needle placement within the T3 bone lesion. Post biopsy images demonstrate no hemorrhage. IMPRESSION: Successful CT-guided core biopsy of a T3 bone lesion. Electronically Signed   By: Marybelle Killings M.D.   On: 05/27/2018 13:29       IMPRESSION/PLAN: 1. Stage IV clear cell carcinoma arising in the right kidney with T3 biopsy confirmed metastasis as well as T4, and left acetabulum/lesser trochanteric disease. Dr. Lisbeth Renshaw discusses the pathology findings and reviews the nature of renal cancers and the need to consider stereotactic radiosurgery to the spine. He outlines that palliative radiotherapy may help, however these tumors can be radioresistant and ablative therapy would be best in the spine. We discussed the logistics of this apart from traditional therapy. However he would be a candidate as well to treat the left acetabulum/lesser trochanter with palliative radiotherapy with 30 Gy/10 fraction approach. We discussed the need to proceed with [facility] planning MRI which he's scheduled for on Sunday. He will also meet with Dr. Sherwood Gambler next week and will have formal discussion about whether there is any role for decompression surgery. However prior discussion we've had is that he is not a surgical candidate because of the histology. We discussed the risks, benefits, short, and long term effects of radiotherapy, and the patient is interested in proceeding. Dr. Lisbeth Renshaw discusses the delivery and logistics of radiotherapy and anticipates a course of 1  fraction to T3-4, and 2 weeks of palliative radiotherapy to the left trochanter and acetabulum. Written consent is obtained and placed in the chart, a copy was provided to the patient. He will return on Thursday next week to see Dr. Marin Olp to discuss further plans for systemic therapy, and start [facility] on 06/24/18. He will also simulate today for SRS, and will have separate simulation of his left acetabulum/lesser trochanter. 2. Pain secondary to #1. The patient has a few days left of oxycodone/apap at home. I encouraged him not to fill Dilaudiad, and to continue with Oxycodone, and I've sent in a new prescription to his pharmacy for 66m tablets, eliminating the tylenol. We reviewed the side effect profile and he will keep uKoreainformed of his symptoms if they worsen. We will hold off on steroids at this  time since he is neurologically intact. He will call us if these symptoms change.   In a visit lasting 60 minutes, greater than 50% of the time was spent face to face discussing his case, and coordinating the patient's care.  The above documentation reflects my direct findings during this shared patient visit. Please see the separate note by Dr. Lisbeth Renshaw on this date for the remainder of the patient's plan of care.    Carola Rhine, PAC

## 2018-06-16 ENCOUNTER — Other Ambulatory Visit: Payer: Self-pay | Admitting: Hematology & Oncology

## 2018-06-16 ENCOUNTER — Encounter (HOSPITAL_COMMUNITY): Payer: Self-pay

## 2018-06-16 ENCOUNTER — Ambulatory Visit (HOSPITAL_COMMUNITY)
Admission: RE | Admit: 2018-06-16 | Discharge: 2018-06-16 | Disposition: A | Discharge status: Home / Self Care | Payer: Medicare Other | Source: Ambulatory Visit | Attending: Hematology & Oncology | Admitting: Hematology & Oncology

## 2018-06-16 DIAGNOSIS — K5730 Diverticulosis of large intestine without perforation or abscess without bleeding: Secondary | ICD-10-CM | POA: Diagnosis not present

## 2018-06-16 DIAGNOSIS — K8020 Calculus of gallbladder without cholecystitis without obstruction: Secondary | ICD-10-CM | POA: Diagnosis not present

## 2018-06-16 DIAGNOSIS — Z7984 Long term (current) use of oral hypoglycemic drugs: Secondary | ICD-10-CM | POA: Diagnosis not present

## 2018-06-16 DIAGNOSIS — C641 Malignant neoplasm of right kidney, except renal pelvis: Secondary | ICD-10-CM

## 2018-06-16 DIAGNOSIS — I700 Atherosclerosis of aorta: Secondary | ICD-10-CM | POA: Diagnosis not present

## 2018-06-16 DIAGNOSIS — E7800 Pure hypercholesterolemia, unspecified: Secondary | ICD-10-CM | POA: Insufficient documentation

## 2018-06-16 DIAGNOSIS — N2889 Other specified disorders of kidney and ureter: Secondary | ICD-10-CM | POA: Diagnosis not present

## 2018-06-16 DIAGNOSIS — I2510 Atherosclerotic heart disease of native coronary artery without angina pectoris: Secondary | ICD-10-CM | POA: Insufficient documentation

## 2018-06-16 DIAGNOSIS — Z87891 Personal history of nicotine dependence: Secondary | ICD-10-CM | POA: Diagnosis not present

## 2018-06-16 DIAGNOSIS — Z7902 Long term (current) use of antithrombotics/antiplatelets: Secondary | ICD-10-CM | POA: Insufficient documentation

## 2018-06-16 DIAGNOSIS — I10 Essential (primary) hypertension: Secondary | ICD-10-CM | POA: Diagnosis not present

## 2018-06-16 DIAGNOSIS — N281 Cyst of kidney, acquired: Secondary | ICD-10-CM | POA: Insufficient documentation

## 2018-06-16 DIAGNOSIS — Z7982 Long term (current) use of aspirin: Secondary | ICD-10-CM | POA: Insufficient documentation

## 2018-06-16 DIAGNOSIS — E119 Type 2 diabetes mellitus without complications: Secondary | ICD-10-CM | POA: Insufficient documentation

## 2018-06-16 DIAGNOSIS — Z79899 Other long term (current) drug therapy: Secondary | ICD-10-CM | POA: Insufficient documentation

## 2018-06-16 DIAGNOSIS — Z8546 Personal history of malignant neoplasm of prostate: Secondary | ICD-10-CM | POA: Diagnosis not present

## 2018-06-16 DIAGNOSIS — R918 Other nonspecific abnormal finding of lung field: Secondary | ICD-10-CM | POA: Diagnosis not present

## 2018-06-16 DIAGNOSIS — C7951 Secondary malignant neoplasm of bone: Secondary | ICD-10-CM | POA: Insufficient documentation

## 2018-06-16 DIAGNOSIS — L821 Other seborrheic keratosis: Secondary | ICD-10-CM | POA: Diagnosis not present

## 2018-06-16 DIAGNOSIS — Z955 Presence of coronary angioplasty implant and graft: Secondary | ICD-10-CM | POA: Insufficient documentation

## 2018-06-16 DIAGNOSIS — Z8249 Family history of ischemic heart disease and other diseases of the circulatory system: Secondary | ICD-10-CM | POA: Insufficient documentation

## 2018-06-16 DIAGNOSIS — Z85820 Personal history of malignant melanoma of skin: Secondary | ICD-10-CM | POA: Insufficient documentation

## 2018-06-16 LAB — CBC
HCT: 40 % (ref 39.0–52.0)
Hemoglobin: 12.5 g/dL — ABNORMAL LOW (ref 13.0–17.0)
MCH: 28.5 pg (ref 26.0–34.0)
MCHC: 31.3 g/dL (ref 30.0–36.0)
MCV: 91.1 fL (ref 78.0–100.0)
PLATELETS: 166 10*3/uL (ref 150–400)
RBC: 4.39 MIL/uL (ref 4.22–5.81)
RDW: 15.3 % (ref 11.5–15.5)
WBC: 8 10*3/uL (ref 4.0–10.5)

## 2018-06-16 LAB — GLUCOSE, RANDOM: GLUCOSE: 118 mg/dL — AB (ref 70–99)

## 2018-06-16 LAB — PROTIME-INR
INR: 0.88
Prothrombin Time: 11.9 seconds (ref 11.4–15.2)

## 2018-06-16 MED ORDER — MIDAZOLAM HCL 2 MG/2ML IJ SOLN
INTRAMUSCULAR | Status: AC | PRN
  Administered 2018-06-16: 1 mg via INTRAVENOUS
  Administered 2018-06-16 (×2): 0.5 mg via INTRAVENOUS

## 2018-06-16 MED ORDER — MIDAZOLAM HCL 2 MG/2ML IJ SOLN
INTRAMUSCULAR | Status: AC
  Filled 2018-06-16: qty 4

## 2018-06-16 MED ORDER — SODIUM CHLORIDE 0.9 % IV SOLN
INTRAVENOUS | Status: AC | PRN
  Administered 2018-06-16: 10 mL/h via INTRAVENOUS

## 2018-06-16 MED ORDER — FENTANYL CITRATE (PF) 100 MCG/2ML IJ SOLN
INTRAMUSCULAR | Status: AC
  Filled 2018-06-16: qty 4

## 2018-06-16 MED ORDER — LIDOCAINE-EPINEPHRINE 1 %-1:100000 IJ SOLN
INTRAMUSCULAR | Status: AC
  Filled 2018-06-16: qty 1

## 2018-06-16 MED ORDER — FENTANYL CITRATE (PF) 100 MCG/2ML IJ SOLN
INTRAMUSCULAR | Status: AC | PRN
  Administered 2018-06-16 (×2): 25 ug via INTRAVENOUS

## 2018-06-16 MED ORDER — SODIUM CHLORIDE 0.9 % IV SOLN: INTRAVENOUS | Status: DC

## 2018-06-16 MED ORDER — GELATIN ABSORBABLE 12-7 MM EX MISC
CUTANEOUS | Status: AC
  Filled 2018-06-16: qty 1

## 2018-06-16 NOTE — Discharge Instructions (Signed)

## 2018-06-16 NOTE — H&P (Signed)
Chief Complaint: Patient was seen in consultation today for right renal mass biopsy at the request of Ennever,Peter R  Referring Physician(s): Ennever,Peter R  Supervising Physician: Sandi Mariscal  Patient Status: [facility] - Out-pt  History of Present Illness: [name] [name] is a 77 y.o. male   Hx Prostate Ca Stage IV renal cell cancer.  9/6 Bx : Diagnosis Bone, biopsy, T3 vertebrae - METASTATIC CARCINOMA  Dr Marin Olp note 06/02/18:   He has a past history of prostate cancer.  Apparently there is also a history of melanoma in situ.  Every now has metastatic kidney cancer. I did send off the specimen for molecular profiling.  Hopefully, we will find that he has a PD-L1 level. As far as systemic therapy is concerned, this will be dictated by his molecular analysis   Dr Marin Olp needing additional Genetic studies And asking for samples for same  RLP Renal lesion for biopsy today     Past Medical History:  Diagnosis Date  . Allergic rhinitis   . Bigeminy   . Bradycardia   . Bruit   . CAD (coronary artery disease)   . Claudication ([place])   . Diabetes mellitus type II   . Dyspnea   . Goals of care, counseling/discussion 06/02/2018  . History of prostate cancer    Dr. Gaynelle Arabian  . Hypercholesteremia    II A  . Hypertension   . Kidney cancer, primary, with metastasis from kidney to other site, right ([place]) 06/02/2018  . Malignant neoplasm of prostate ([place])   . Microalbuminuria   . Seborrheic keratosis     Past Surgical History:  Procedure Laterality Date  . CORONARY ANGIOPLASTY WITH STENT PLACEMENT    . IR FLUORO RM 30-60 MIN  05/24/2018    Allergies: Patient has no known allergies.  Medications: Prior to Admission medications   Medication Sig Start Date End Date Taking? Authorizing Provider  albuterol (PROVENTIL HFA;VENTOLIN HFA) 108 (90 Base) MCG/ACT inhaler INHALE 2 PUFFS INTO LUNGS EVERY 6 HOURS AS NEEDED FOR WHEEZING Patient taking differently: Inhale 2 puffs into  the lungs every 6 (six) hours as needed.  04/15/18  Yes Hali Marry, MD  amLODipine (NORVASC) 10 MG tablet Take 1 tablet (10 mg total) by mouth daily. Due for follow up visit w/PCP 05/17/18  Yes Hali Marry, MD  ANORO ELLIPTA 62.5-25 MCG/INH AEPB INHALE 1 PUFF BY MOUTH EVERY DAY Patient taking differently: Inhale 1 puff into the lungs daily.  05/09/18  Yes Hali Marry, MD  aspirin EC 81 MG tablet Take 1 tablet (81 mg total) by mouth daily. 07/23/17  Yes Josue Hector, MD  atorvastatin (LIPITOR) 40 MG tablet Take 1 tablet (40 mg total) by mouth daily. Due for follow up visit w/PCP 05/17/18  Yes Hali Marry, MD  clopidogrel (PLAVIX) 75 MG tablet TAKE 1 TABLET BY MOUTH ONCE DAILY Patient not taking: Reported on 06/14/2018 12/20/17  Yes Josue Hector, MD  doxazosin (CARDURA) 4 MG tablet TAKE 1 TABLET BY MOUTH AT BEDTIME 04/12/18  Yes Hali Marry, MD  glipiZIDE (GLUCOTROL XL) 5 MG 24 hr tablet TAKE 1 TABLET(5 MG) BY MOUTH DAILY WITH BREAKFAST Patient taking differently: Take 5 mg by mouth daily with breakfast.  12/09/17  Yes Hali Marry, MD  hydrochlorothiazide (HYDRODIURIL) 25 MG tablet TAKE 1 TABLET BY MOUTH EVERY OTHER DAY 12/20/17  Yes Josue Hector, MD  losartan (COZAAR) 100 MG tablet TAKE 1 TABLET BY MOUTH ONCE DAILY 12/20/17  Yes Josue Hector, MD  metFORMIN (GLUCOPHAGE-XR) 500 MG 24 hr tablet TAKE 2 TABLETS BY MOUTH ONCE DAILY WITH BREAKFAST Patient taking differently: Take 1,000 mg by mouth daily with breakfast. take 2 tablets by mouth once daily with BREAKFAST 03/25/18  Yes Hali Marry, MD  metoprolol succinate (TOPROL-XL) 100 MG 24 hr tablet TAKE 1 TABLET BY MOUTH EVERY DAY 03/16/18  Yes Hali Marry, MD  Multiple Vitamins-Minerals (MULTIVITAMIN WITH MINERALS) tablet Take 1 tablet by mouth daily.   Yes [provider]  oxyCODONE-acetaminophen (PERCOCET) 10-325 MG tablet Take 1 tablet by mouth every 4 (four) hours as  needed for pain. 06/06/18  Yes Gery Pray, MD  Blood Glucose Monitoring Suppl (CONTOUR BLOOD GLUCOSE SYSTEM) DEVI Diagnoses - Diabetes E 11.8 Check blood sugar once daily 04/20/16   Hali Marry, MD  glucose blood (BAYER CONTOUR TEST) test strip Diagnoses - Diabetes E 11.8 Check blood sugar once daily 04/20/16   Hali Marry, MD  HYDROcodone-acetaminophen (NORCO/VICODIN) 5-325 MG tablet Take 1-2 tablets by mouth every 6 (six) hours as needed for moderate pain. 06/02/18   [name], MD  HYDROmorphone (DILAUDID) 2 MG tablet Take 1 tablet (2 mg total) by mouth every 4 (four) hours as needed for severe pain. Patient not taking: Reported on 06/13/2018 06/08/18   Gery Pray, MD  oxyCODONE (OXY IR/ROXICODONE) 5 MG immediate release tablet Take 1-2 tablets (5-10 mg total) by mouth every 4 (four) hours as needed for severe pain. 06/14/18   Hayden Pedro, PA-C  tamsulosin (FLOMAX) 0.4 MG CAPS capsule Take 1 capsule (0.4 mg total) by mouth daily. 10/21/15   Hali Marry, MD     Family History  Problem Relation Age of Onset  . Heart attack Father   . Coronary artery disease Brother     Social History   Socioeconomic History  . Marital status: Widowed    Spouse name: Not on file  . Number of children: Not on file  . Years of education: Not on file  . Highest education level: Not on file  Occupational History  . Occupation: Retired but still works Dentist: RETIRED  Social Needs  . Financial resource strain: Not on file  . Food insecurity:    Worry: Not on file    Inability: Not on file  . Transportation needs:    Medical: Not on file    Non-medical: Not on file  Tobacco Use  . Smoking status: Former Smoker    Packs/day: 1.00    Years: 20.00    Pack years: 20.00    Last attempt to quit: 04/22/2003    Years since quitting: 15.1  . Smokeless tobacco: Never Used  Substance and Sexual Activity  . Alcohol use: Not Currently     Alcohol/week: 1.0 standard drinks    Types: 1 Standard drinks or equivalent per week  . Drug use: Never  . Sexual activity: Not on file  Lifestyle  . Physical activity:    Days per week: Not on file    Minutes per session: Not on file  . Stress: Not on file  Relationships  . Social connections:    Talks on phone: Not on file    Gets together: Not on file    Attends religious service: Not on file    Active member of club or organization: Not on file    Attends meetings of clubs or organizations: Not on file    Relationship  status: Not on file  Other Topics Concern  . Not on file  Social History Narrative   Widowed 08/2009   Has 4 kids, 2 are local   Fair diet   No regular exercise    Review of Systems: A 12 point ROS discussed and pertinent positives are indicated in the HPI above.  All other systems are negative.  Review of Systems  Constitutional: Negative for activity change, appetite change, fatigue and fever.  Respiratory: Negative for shortness of breath.   Cardiovascular: Negative for chest pain.  Gastrointestinal: Negative for abdominal pain.  Musculoskeletal: Positive for back pain.  Neurological: Negative for weakness.  Psychiatric/Behavioral: Negative for behavioral problems and confusion.    Vital Signs: BP (!) 144/61   Pulse (!) 58   Temp 98.3 F (36.8 C) (Oral)   Resp 16   Ht '5\' 10"'  (1.778 m)   Wt 172 lb (78 kg)   SpO2 91%   BMI 24.68 kg/m   Physical Exam  Constitutional: He is oriented to person, place, and time.  Cardiovascular: Normal rate, regular rhythm and normal heart sounds.  Pulmonary/Chest: Effort normal and breath sounds normal.  Abdominal: Soft. Bowel sounds are normal.  Musculoskeletal: Normal range of motion.  Neurological: He is alert and oriented to person, place, and time.  Skin: Skin is warm and dry.  Psychiatric: He has a normal mood and affect. His behavior is normal. Judgment and thought content normal.  Vitals  reviewed.   Imaging: Ct Chest W Contrast  Result Date: 06/08/2018 CLINICAL DATA:  Stage IV renal cell cancer EXAM: CT CHEST, ABDOMEN, AND PELVIS WITH CONTRAST TECHNIQUE: Multidetector CT imaging of the chest, abdomen and pelvis was performed following the standard protocol during bolus administration of intravenous contrast. CONTRAST:  181m ISOVUE-300 IOPAMIDOL (ISOVUE-300) INJECTION 61% COMPARISON:  CT chest dated 04/21/2018. CT abdomen/pelvis dated 02/23/2014. FINDINGS: CT CHEST FINDINGS Cardiovascular: The heart is normal in size. No pericardial effusion. No evidence of thoracic aortic aneurysm. Atherosclerotic calcifications of the aortic arch. Three vessel coronary atherosclerosis. Mediastinum/Nodes: Small mediastinal lymph nodes, including a 9 mm short axis subcarinal node (series 2/image 32), previously 11 mm. Visualized thyroid is unremarkable. Lungs/Pleura: 4 mm calcified granuloma in the right upper lobe (series 4/image 39), benign. Mild subpleural nodularity in the left upper lobe, measuring 2-3 mm (series 4/images 30 and 44), unchanged. Biapical pleural-parenchymal scarring. Mild centrilobular and paraseptal emphysematous changes, upper lobe predominant. No focal consolidation. No pleural effusion or pneumothorax. Musculoskeletal: Severe pathologic compression fracture deformity at T3, with abnormal soft tissue corresponding to known metastatic renal cell carcinoma, extending posteriorly into the spinal canal (series 2/image 13). Abnormal soft tissue extends to the anterior/superior corner of the T4 vertebral body (sagittal image 120), suggesting additional T4 metastasis. Degenerative changes of the thoracic spine. CT ABDOMEN PELVIS FINDINGS Hepatobiliary: Liver is within normal limits. Cholelithiasis, without associated inflammatory changes. No intrahepatic or extrahepatic ductal dilatation. Pancreas: Within normal limits. Spleen: Within normal limits. Adrenals/Urinary Tract: Adrenal glands are  within normal limits. 2.5 x 3.5 x 4.3 cm solid renal mass in the posterior right lower kidney (series 7/image 25), most conspicuous on delayed imaging. Additional right renal cysts, including 4.5 cm simple interpolar cyst (series 2/image 77) and a 9 mm hyperdense/hemorrhagic lesion in the lateral right lower pole (series 2/image 34). Left kidney is within normal limits.  No hydronephrosis. Bladder is underdistended but unremarkable. Stomach/Bowel: Stomach is within normal limits. No evidence of bowel obstruction. Normal appendix (series 2/image 98). Sigmoid diverticulosis, without evidence  of diverticulitis. Vascular/Lymphatic: No evidence of abdominal aortic aneurysm. Fusiform ectasia of the infrarenal abdominal aorta, measuring up to 2.5 cm. Atherosclerotic calcifications of the abdominal aorta and branch vessels. No suspicious abdominopelvic lymphadenopathy. Reproductive: Brachytherapy seeds in the prostate. Other: No abdominopelvic ascites. Musculoskeletal: Degenerative changes of the lumbar spine. 2.9 cm lytic/soft tissue lesion along the posterior column of the left acetabulum (series 2/image 113), suspicious for metastasis. Status post ORIF of the left proximal femur. Left lesser trochanter fracture with associated 2.8 cm lytic/soft tissue lesion (series 2/image 130), suspicious for metastasis. IMPRESSION: 4.3 cm solid mass in the posterior right lower kidney, highly suspicious for solid renal neoplasm in this patient with known metastatic clear cell renal cell carcinoma. Biopsy-proven metastasis at T3 with severe pathologic compression fracture and extension into the spinal canal. Suspected extension/involvement of the T4 vertebral body, as above. Additional osseous metastasis involving the posterior column of the left acetabulum and the left lesser trochanter. Status post ORIF of the left proximal femur. Additional ancillary findings as above. Electronically Signed   By: Julian Hy M.D.   On:  06/08/2018 11:54   Ct Abdomen Pelvis W Contrast  Result Date: 06/08/2018 CLINICAL DATA:  Stage IV renal cell cancer EXAM: CT CHEST, ABDOMEN, AND PELVIS WITH CONTRAST TECHNIQUE: Multidetector CT imaging of the chest, abdomen and pelvis was performed following the standard protocol during bolus administration of intravenous contrast. CONTRAST:  14m ISOVUE-300 IOPAMIDOL (ISOVUE-300) INJECTION 61% COMPARISON:  CT chest dated 04/21/2018. CT abdomen/pelvis dated 02/23/2014. FINDINGS: CT CHEST FINDINGS Cardiovascular: The heart is normal in size. No pericardial effusion. No evidence of thoracic aortic aneurysm. Atherosclerotic calcifications of the aortic arch. Three vessel coronary atherosclerosis. Mediastinum/Nodes: Small mediastinal lymph nodes, including a 9 mm short axis subcarinal node (series 2/image 32), previously 11 mm. Visualized thyroid is unremarkable. Lungs/Pleura: 4 mm calcified granuloma in the right upper lobe (series 4/image 39), benign. Mild subpleural nodularity in the left upper lobe, measuring 2-3 mm (series 4/images 30 and 44), unchanged. Biapical pleural-parenchymal scarring. Mild centrilobular and paraseptal emphysematous changes, upper lobe predominant. No focal consolidation. No pleural effusion or pneumothorax. Musculoskeletal: Severe pathologic compression fracture deformity at T3, with abnormal soft tissue corresponding to known metastatic renal cell carcinoma, extending posteriorly into the spinal canal (series 2/image 13). Abnormal soft tissue extends to the anterior/superior corner of the T4 vertebral body (sagittal image 120), suggesting additional T4 metastasis. Degenerative changes of the thoracic spine. CT ABDOMEN PELVIS FINDINGS Hepatobiliary: Liver is within normal limits. Cholelithiasis, without associated inflammatory changes. No intrahepatic or extrahepatic ductal dilatation. Pancreas: Within normal limits. Spleen: Within normal limits. Adrenals/Urinary Tract: Adrenal glands  are within normal limits. 2.5 x 3.5 x 4.3 cm solid renal mass in the posterior right lower kidney (series 7/image 25), most conspicuous on delayed imaging. Additional right renal cysts, including 4.5 cm simple interpolar cyst (series 2/image 77) and a 9 mm hyperdense/hemorrhagic lesion in the lateral right lower pole (series 2/image 34). Left kidney is within normal limits.  No hydronephrosis. Bladder is underdistended but unremarkable. Stomach/Bowel: Stomach is within normal limits. No evidence of bowel obstruction. Normal appendix (series 2/image 98). Sigmoid diverticulosis, without evidence of diverticulitis. Vascular/Lymphatic: No evidence of abdominal aortic aneurysm. Fusiform ectasia of the infrarenal abdominal aorta, measuring up to 2.5 cm. Atherosclerotic calcifications of the abdominal aorta and branch vessels. No suspicious abdominopelvic lymphadenopathy. Reproductive: Brachytherapy seeds in the prostate. Other: No abdominopelvic ascites. Musculoskeletal: Degenerative changes of the lumbar spine. 2.9 cm lytic/soft tissue lesion along the posterior column of  the left acetabulum (series 2/image 113), suspicious for metastasis. Status post ORIF of the left proximal femur. Left lesser trochanter fracture with associated 2.8 cm lytic/soft tissue lesion (series 2/image 130), suspicious for metastasis. IMPRESSION: 4.3 cm solid mass in the posterior right lower kidney, highly suspicious for solid renal neoplasm in this patient with known metastatic clear cell renal cell carcinoma. Biopsy-proven metastasis at T3 with severe pathologic compression fracture and extension into the spinal canal. Suspected extension/involvement of the T4 vertebral body, as above. Additional osseous metastasis involving the posterior column of the left acetabulum and the left lesser trochanter. Status post ORIF of the left proximal femur. Additional ancillary findings as above. Electronically Signed   By: Julian Hy M.D.   On:  06/08/2018 11:54   Ir Fluoro Rm 30-60 Min  Result Date: 05/25/2018 CLINICAL DATA:  Severe of the thoracic pain secondary to of the compression fracture at T3 EXAM: IR FLOURO RM 0-60 MIN ANESTHESIA/SEDATION: None MEDICATIONS: Nine PROCEDURE: Nine COMPLICATIONS: None immediate FINDINGS: Under intermittent biplane fluoroscopy, the compressed T3 vertebral body visualize on the AP projection but not on the lateral projection Therefore the procedure was deemed too risky for biopsy and vertebral body augmentation using fluoroscopic guidance. The procedure was therefore aborted. IMPRESSION: See above Electronically Signed   By: Luanne Bras M.D.   On: 05/25/2018 09:07   Ct Biopsy  Result Date: 05/27/2018 INDICATION: T3 destructive bone lesion EXAM: CT BIOPSY MEDICATIONS: None. ANESTHESIA/SEDATION: Fentanyl 125 mcg IV; Versed 2 mg IV Moderate Sedation Time:  15 minutes The patient was continuously monitored during the procedure by the interventional radiology nurse under my direct supervision. FLUOROSCOPY TIME:  Fluoroscopy Time:  minutes  seconds ( mGy). COMPLICATIONS: None immediate. PROCEDURE: Informed written consent was obtained from the patient after a thorough discussion of the procedural risks, benefits and alternatives. All questions were addressed. Maximal Sterile Barrier Technique was utilized including caps, mask, sterile gowns, sterile gloves, sterile drape, hand hygiene and skin antiseptic. A timeout was performed prior to the initiation of the procedure. Under CT guidance, a(n) 17 gauge guide needle was advanced into the T3 bone lesion via left paraspinal approach. Subsequently 4 18 gauge core biopsies were obtained. The guide needle was removed. Post biopsy images demonstrate no hemorrhage. Patient tolerated the procedure well without complication. Vital sign monitoring by nursing staff during the procedure will continue as patient is in the special procedures unit for post procedure observation.  FINDINGS: The images document guide needle placement within the T3 bone lesion. Post biopsy images demonstrate no hemorrhage. IMPRESSION: Successful CT-guided core biopsy of a T3 bone lesion. Electronically Signed   By: Marybelle Killings M.D.   On: 05/27/2018 13:29    Labs:  CBC: Recent Labs    05/05/18 1525 05/24/18 0946 05/27/18 0928 06/16/18 0606  WBC 10.2* 5.4 6.5 8.0  HGB 12.7* 12.3* 13.7 12.5*  HCT 40.3 40.5 43.1 40.0  PLT 224 152 177 166    COAGS: Recent Labs    05/24/18 0946  INR 0.97    BMP: Recent Labs    02/25/18 1201 02/28/18 1429 04/26/18 0900 04/29/18 1047 05/05/18 1525 05/24/18 0946  NA 141 141 140 141 141 142  K 4.2 4.2 4.3 4.5 4.3 3.9  CL 107 106 103 104 103 107  CO2 '25 26 25 26 29 26  ' GLUCOSE 104 139* 139* 128 97 142*  BUN 39* 36* 24 28* 38* 24*  CALCIUM 10.0 9.8 9.1 9.8 9.9 9.6  CREATININE 1.43* 1.43* 1.04  1.11 1.50* 1.06  GFRNONAA 47* 47* 69  --   --  >60  GFRAA 55* 55* 80  --   --  >60    LIVER FUNCTION TESTS: Recent Labs    02/28/18 1429 04/26/18 0900 04/29/18 1047 05/05/18 1525  BILITOT 0.5 1.0 0.7 0.7  AST '14 24 25 25  ' ALT '13 25 25 31  ' ALKPHOS  --   --   --  115*  PROT 6.5 6.5 6.8  6.5 7.0  ALBUMIN  --   --   --  3.3*    TUMOR MARKERS: No results for input(s): AFPTM, CEA, CA199, CHROMGRNA in the last 8760 hours.  Assessment and Plan:  Known Prostate ca Renal cell cancer- metastasis to bone Previous T3 biopsy sent for Molecular studies Needing additional biopsy for Genetic studies Now scheduled for right renal mass biopsy Risks and benefits discussed with the patient including, but not limited to bleeding, infection, damage to adjacent structures or low yield requiring additional tests.  All of the patient's questions were answered, patient is agreeable to proceed. Consent signed and in chart.   Thank you for this interesting consult.  I greatly enjoyed meeting [name] [name] and look forward to participating in their care.   A copy of this report was sent to the requesting provider on this date.  Electronically Signed: Lavonia Drafts, PA-C 06/16/2018, 7:05 AM   I spent a total of    25 Minutes in face to face in clinical consultation, greater than 50% of which was counseling/coordinating care for renal mass bx

## 2018-06-16 NOTE — Procedures (Signed)
Pre procedural Dx: Right renal lesion Post procedural Dx: Same  Technically successful CT guided biopsy of right renal lesion.   EBL: None.   Complications: None immediate.   Ronny Bacon, MD Pager #: [phone]

## 2018-06-16 NOTE — Progress Notes (Signed)
Pt to restart plavix tomorrow as long as there is no hematuria, pt verbalized understanding.

## 2018-06-16 NOTE — Discharge Instructions (Signed)
Percutaneous Kidney Biopsy, Care After °This sheet gives you information about how to care for yourself after your procedure. Your health care provider may also give you more specific instructions. If you have problems or questions, contact your health care provider. °What can I expect after the procedure? °After the procedure, it is common to have: °Pain or soreness near the area where the needle went through your skin (biopsy site). °Bright pink or cloudy urine for 24 hours after the procedure. ° °Follow these instructions at home: °Activity °Return to your normal activities as told by your health care provider. Ask your health care provider what activities are safe for you. °Do not drive for 24 hours if you were given a medicine to help you relax (sedative). °Do not lift anything that is heavier than 10 lb (4.5 kg) until your health care provider tells you that it is safe. °Avoid activities that take a lot of effort (are strenuous) until your health care provider approves. Most people will have to wait 2 weeks before returning to activities such as exercise or sexual intercourse. °General instructions °Take over-the-counter and prescription medicines only as told by your health care provider. °You may eat and drink after your procedure. Follow instructions from your health care provider about eating or drinking restrictions. °Check your biopsy site every day for signs of infection. Check for: °More redness, swelling, or pain. °More fluid or blood. °Warmth. °Pus or a bad smell. °Keep all follow-up visits as told by your health care provider. This is important. °Contact a health care provider if: °You have more redness, swelling, or pain around your biopsy site. °You have more fluid or blood coming from your biopsy site. °Your biopsy site feels warm to the touch. °You have pus or a bad smell coming from your biopsy site. °You have blood in your urine more than 24 hours after your procedure. °Get help right away  if: °You have dark red or brown urine. °You have a fever. °You are unable to urinate. °You feel burning when you urinate. °You feel faint. °You have severe pain in your abdomen or side. °This information is not intended to replace advice given to you by your health care provider. Make sure you discuss any questions you have with your health care provider. °Document Released: 05/10/2013 Document Revised: 06/19/2016 Document Reviewed: 06/19/2016 °[employer]vier Interactive Patient Education © 2018 [employer] ° °

## 2018-06-17 ENCOUNTER — Other Ambulatory Visit: Payer: Self-pay | Admitting: Family Medicine

## 2018-06-19 ENCOUNTER — Ambulatory Visit
Admission: RE | Admit: 2018-06-19 | Discharge: 2018-06-19 | Disposition: A | Discharge status: Home / Self Care | Payer: Medicare Other | Source: Ambulatory Visit | Attending: Radiation Oncology | Admitting: Radiation Oncology

## 2018-06-19 DIAGNOSIS — C7900 Secondary malignant neoplasm of unspecified kidney and renal pelvis: Secondary | ICD-10-CM | POA: Diagnosis not present

## 2018-06-19 DIAGNOSIS — C7951 Secondary malignant neoplasm of bone: Secondary | ICD-10-CM

## 2018-06-19 MED ORDER — GADOBENATE DIMEGLUMINE 529 MG/ML IV SOLN
15.0000 mL | Freq: Once | INTRAVENOUS | Status: AC | PRN
  Administered 2018-06-19: 15 mL via INTRAVENOUS

## 2018-06-21 ENCOUNTER — Ambulatory Visit: Payer: Medicare Other | Admitting: Radiation Oncology

## 2018-06-21 DIAGNOSIS — C7951 Secondary malignant neoplasm of bone: Secondary | ICD-10-CM | POA: Diagnosis not present

## 2018-06-21 DIAGNOSIS — M546 Pain in thoracic spine: Secondary | ICD-10-CM | POA: Diagnosis not present

## 2018-06-23 ENCOUNTER — Other Ambulatory Visit: Payer: Self-pay | Admitting: *Deleted

## 2018-06-23 ENCOUNTER — Other Ambulatory Visit: Payer: Self-pay | Admitting: Family

## 2018-06-23 ENCOUNTER — Other Ambulatory Visit: Payer: Self-pay

## 2018-06-23 ENCOUNTER — Inpatient Hospital Stay: Payer: Medicare Other

## 2018-06-23 ENCOUNTER — Inpatient Hospital Stay: Payer: Medicare Other | Attending: Hematology & Oncology | Admitting: Hematology & Oncology

## 2018-06-23 ENCOUNTER — Encounter: Payer: Self-pay | Admitting: Hematology & Oncology

## 2018-06-23 VITALS — BP 142/50 | HR 64 | Temp 98.7°F | Resp 18 | Wt 172.0 lb

## 2018-06-23 DIAGNOSIS — M4850XA Collapsed vertebra, not elsewhere classified, site unspecified, initial encounter for fracture: Secondary | ICD-10-CM

## 2018-06-23 DIAGNOSIS — C7951 Secondary malignant neoplasm of bone: Secondary | ICD-10-CM

## 2018-06-23 DIAGNOSIS — Z5111 Encounter for antineoplastic chemotherapy: Secondary | ICD-10-CM | POA: Insufficient documentation

## 2018-06-23 DIAGNOSIS — Z79899 Other long term (current) drug therapy: Secondary | ICD-10-CM | POA: Diagnosis not present

## 2018-06-23 DIAGNOSIS — C641 Malignant neoplasm of right kidney, except renal pelvis: Secondary | ICD-10-CM

## 2018-06-23 DIAGNOSIS — E039 Hypothyroidism, unspecified: Secondary | ICD-10-CM

## 2018-06-23 DIAGNOSIS — Z8546 Personal history of malignant neoplasm of prostate: Secondary | ICD-10-CM | POA: Diagnosis not present

## 2018-06-23 DIAGNOSIS — Z86006 Personal history of melanoma in-situ: Secondary | ICD-10-CM

## 2018-06-23 DIAGNOSIS — D49519 Neoplasm of unspecified behavior of unspecified kidney: Secondary | ICD-10-CM

## 2018-06-23 LAB — CMP (CANCER CENTER ONLY)
ALT: 27 U/L (ref 10–47)
ANION GAP: 5 (ref 5–15)
AST: 30 U/L (ref 11–38)
Albumin: 3.7 g/dL (ref 3.5–5.0)
Alkaline Phosphatase: 127 U/L — ABNORMAL HIGH (ref 26–84)
BILIRUBIN TOTAL: 0.8 mg/dL (ref 0.2–1.6)
BUN: 30 mg/dL — AB (ref 7–22)
CHLORIDE: 113 mmol/L — AB (ref 98–108)
CO2: 29 mmol/L (ref 18–33)
Calcium: 10.5 mg/dL — ABNORMAL HIGH (ref 8.0–10.3)
Creatinine: 1.4 mg/dL — ABNORMAL HIGH (ref 0.60–1.20)
Glucose, Bld: 127 mg/dL — ABNORMAL HIGH (ref 73–118)
POTASSIUM: 5.1 mmol/L — AB (ref 3.3–4.7)
SODIUM: 147 mmol/L — AB (ref 128–145)
TOTAL PROTEIN: 7 g/dL (ref 6.4–8.1)

## 2018-06-23 LAB — CBC WITH DIFFERENTIAL (CANCER CENTER ONLY)
Basophils Absolute: 0 10*3/uL (ref 0.0–0.1)
Basophils Relative: 1 %
Eosinophils Absolute: 0.2 10*3/uL (ref 0.0–0.5)
Eosinophils Relative: 2 %
HCT: 39.9 % (ref 38.7–49.9)
HEMOGLOBIN: 12.5 g/dL — AB (ref 13.0–17.1)
LYMPHS ABS: 2 10*3/uL (ref 0.9–3.3)
LYMPHS PCT: 27 %
MCH: 28.7 pg (ref 28.0–33.4)
MCHC: 31.3 g/dL — AB (ref 32.0–35.9)
MCV: 91.5 fL (ref 82.0–98.0)
MONOS PCT: 7 %
Monocytes Absolute: 0.5 10*3/uL (ref 0.1–0.9)
Neutro Abs: 4.7 10*3/uL (ref 1.5–6.5)
Neutrophils Relative %: 63 %
Platelet Count: 178 10*3/uL (ref 145–400)
RBC: 4.36 MIL/uL (ref 4.20–5.70)
RDW: 15.3 % (ref 11.1–15.7)
WBC: 7.4 10*3/uL (ref 4.0–10.0)

## 2018-06-23 LAB — LACTATE DEHYDROGENASE: LDH: 190 U/L (ref 98–192)

## 2018-06-23 MED ORDER — AXITINIB 5 MG PO TABS: ORAL_TABLET | ORAL | 3 refills | Status: DC

## 2018-06-23 MED ORDER — OXYCODONE-ACETAMINOPHEN 10-325 MG PO TABS: 1.0000 | ORAL_TABLET | Freq: Four times a day (QID) | ORAL | 0 refills | Status: DC | PRN

## 2018-06-23 MED ORDER — DENOSUMAB 120 MG/1.7ML ~~LOC~~ SOLN
120.0000 mg | Freq: Once | SUBCUTANEOUS | Status: AC
  Administered 2018-06-23: 120 mg via SUBCUTANEOUS

## 2018-06-23 MED ORDER — DENOSUMAB 120 MG/1.7ML ~~LOC~~ SOLN
SUBCUTANEOUS | Status: AC
  Filled 2018-06-23: qty 1.7

## 2018-06-23 NOTE — Patient Instructions (Signed)
Denosumab injection Delton See) What is this medicine? DENOSUMAB (den oh sue mab) slows bone breakdown. Prolia is used to treat osteoporosis in women after menopause and in men. Delton See is used to treat a high calcium level due to cancer and to prevent bone fractures and other bone problems caused by multiple myeloma or cancer bone metastases. Delton See is also used to treat giant cell tumor of the bone. This medicine may be used for other purposes; ask your health care provider or pharmacist if you have questions. COMMON BRAND NAME(S): Prolia, XGEVA What should I tell my health care provider before I take this medicine? They need to know if you have any of these conditions: -dental disease -having surgery or tooth extraction -infection -kidney disease -low levels of calcium or Vitamin D in the blood -malnutrition -on hemodialysis -skin conditions or sensitivity -thyroid or parathyroid disease -an unusual reaction to denosumab, other medicines, foods, dyes, or preservatives -pregnant or trying to get pregnant -breast-feeding How should I use this medicine? This medicine is for injection under the skin. It is given by a health care professional in a hospital or clinic setting. If you are getting Prolia, a special MedGuide will be given to you by the pharmacist with each prescription and refill. Be sure to read this information carefully each time. For Prolia, talk to your pediatrician regarding the use of this medicine in children. Special care may be needed. For Delton See, talk to your pediatrician regarding the use of this medicine in children. While this drug may be prescribed for children as young as 13 years for selected conditions, precautions do apply. Overdosage: If you think you have taken too much of this medicine contact a poison control center or emergency room at once. NOTE: This medicine is only for you. Do not share this medicine with others. What if I miss a dose? It is important not to  miss your dose. Call your doctor or health care professional if you are unable to keep an appointment. What may interact with this medicine? Do not take this medicine with any of the following medications: -other medicines containing denosumab This medicine may also interact with the following medications: -medicines that lower your chance of fighting infection -steroid medicines like prednisone or cortisone This list may not describe all possible interactions. Give your health care provider a list of all the medicines, herbs, non-prescription drugs, or dietary supplements you use. Also tell them if you smoke, drink alcohol, or use illegal drugs. Some items may interact with your medicine. What should I watch for while using this medicine? Visit your doctor or health care professional for regular checks on your progress. Your doctor or health care professional may order blood tests and other tests to see how you are doing. Call your doctor or health care professional for advice if you get a fever, chills or sore throat, or other symptoms of a cold or flu. Do not treat yourself. This drug may decrease your body's ability to fight infection. Try to avoid being around people who are sick. You should make sure you get enough calcium and vitamin D while you are taking this medicine, unless your doctor tells you not to. Discuss the foods you eat and the vitamins you take with your health care professional. See your dentist regularly. Brush and floss your teeth as directed. Before you have any dental work done, tell your dentist you are receiving this medicine. Do not become pregnant while taking this medicine or for 5 months after  after stopping it. Talk with your doctor or health care professional about your birth control options while taking this medicine. Women should inform their doctor if they wish to become pregnant or think they might be pregnant. There is a potential for serious side effects to an unborn  child. Talk to your health care professional or pharmacist for more information. °What side effects may I notice from receiving this medicine? °Side effects that you should report to your doctor or health care professional as soon as possible: °-allergic reactions like skin rash, itching or hives, swelling of the face, lips, or tongue °-bone pain °-breathing problems °-dizziness °-jaw pain, especially after dental work °-redness, blistering, peeling of the skin °-signs and symptoms of infection like fever or chills; cough; sore throat; pain or trouble passing urine °-signs of low calcium like fast heartbeat, muscle cramps or muscle pain; pain, tingling, numbness in the hands or feet; seizures °-unusual bleeding or bruising °-unusually weak or tired °Side effects that usually do not require medical attention (report to your doctor or health care professional if they continue or are bothersome): °-constipation °-diarrhea °-headache °-joint pain °-loss of appetite °-muscle pain °-runny nose °-tiredness °-upset stomach °This list may not describe all possible side effects. Call your doctor for medical advice about side effects. You may report side effects to FDA at 1-800-FDA-1088. °Where should I keep my medicine? °This medicine is only given in a clinic, doctor's office, or other health care setting and will not be stored at home. °NOTE: This sheet is a summary. It may not cover all possible information. If you have questions about this medicine, talk to your doctor, pharmacist, or health care provider. °© 2018 Elsevier/Gold Standard (2016-09-29 19:17:21) ° °

## 2018-06-23 NOTE — Progress Notes (Signed)
START OFF PATHWAY REGIMEN - Renal Cell   [id number]:Axitinib 5 mg PO BID D1-21 + Pembrolizumab 200 mg IV D1 q21 Days:   A cycle is every 21 days:     Axitinib      Pembrolizumab   **Always confirm dose/schedule in your pharmacy ordering system**  Patient Characteristics: Metastatic, Oligometastatic Disease AJCC M Category: M1 AJCC 8 Stage Grouping: IV Current evidence of distant metastases<= Yes AJCC T Category: T3a AJCC N Category: NX Does patient have oligometastatic disease<= Yes  Intent of Therapy: Non-Curative / Palliative Intent, Discussed with Patient

## 2018-06-23 NOTE — Progress Notes (Signed)
Hematology and Oncology Follow Up Visit  [name] [name] [id number] 07-09-1941 77 y.o. 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  He has seen Dr. Sondra Come of radiation oncology.  He will start radiation therapy to the T3 lesion in his left hip on Monday.  I does not look like kyphoplasty can be done because of the location of the lesion at T3.  We did have to good a biopsy of his actual kidney mass.  This was done on 06/16/2018.  The pathology report ([id number]) showed clear cell renal cell carcinoma.  It was high-grade.  We are sending the tumor off for molecular analysis.  Thankfully, he does not have any metastasis outside of that which in his bones.  A CT scan of the chest/abdomen/pelvis did not show any areas of metastatic disease otherwise.  He has not yet had the MRI of his brain.  I would have to say that [name] [name] would be classified as a relatively good prognostic clear cell carcinoma.  This I think will allow [place] to utilize therapy with axitinib/pembrolizumab.  He comes in with his son.  His pain is doing okay.  The oxycodone/acetaminophen is doing well for him.  We will go ahead and re-new this.  I will discontinue his other pain medications.  He is eating okay.  He has had no nausea or vomiting.  He has had no cough.  He has had no leg swelling.  Overall, I said that his performance status is ECOG 1.    Medications:  Current Outpatient Medications:  .  albuterol (PROVENTIL HFA;VENTOLIN HFA) 108 (90 Base) MCG/ACT inhaler, INHALE 2 PUFFS INTO LUNGS EVERY 6 HOURS AS NEEDED FOR WHEEZING (Patient taking differently: Inhale 2 puffs into the lungs every 6 (six) hours as needed. ), Disp: 8.5 g, Rfl: 0 .   amLODipine (NORVASC) 10 MG tablet, TAKE 1 TABLET(10 MG) BY MOUTH DAILY. FOLLOW UP VISIT WITH PCP, Disp: 30 tablet, Rfl: 0 .  ANORO ELLIPTA 62.5-25 MCG/INH AEPB, INHALE 1 PUFF BY MOUTH EVERY DAY (Patient taking differently: Inhale 1 puff into the lungs daily. ), Disp: 60 each, Rfl: 3 .  aspirin EC 81 MG tablet, Take 1 tablet (81 mg total) by mouth daily., Disp: , Rfl:  .  atorvastatin (LIPITOR) 40 MG tablet, TAKE 1 TABLET(40 MG) BY MOUTH DAILY. FOLLOW UP VISIT WITH PCP, Disp: 30 tablet, Rfl: 0 .  Blood Glucose Monitoring Suppl (CONTOUR BLOOD GLUCOSE SYSTEM) DEVI, Diagnoses - Diabetes E 11.8 Check blood sugar once daily, Disp: 1 Device, Rfl: 0 .  clopidogrel (PLAVIX) 75 MG tablet, TAKE 1 TABLET BY MOUTH ONCE DAILY (Patient not taking: Reported on 06/14/2018), Disp: 90 tablet, Rfl: 3 .  doxazosin (CARDURA) 4 MG tablet, TAKE 1 TABLET BY MOUTH AT BEDTIME, Disp: 90 tablet, Rfl: 0 .  glipiZIDE (GLIPIZIDE XL) 5 MG 24 hr tablet, Take 1 tablet (5 mg total) by mouth daily with breakfast. NEEDS FOLLOW UP WITH MD, Disp: 90 tablet, Rfl: 0 .  glucose blood (BAYER CONTOUR TEST) test strip, Diagnoses - Diabetes E 11.8 Check blood sugar once daily, Disp: 100 each, Rfl: prn .  hydrochlorothiazide (HYDRODIURIL) 25 MG tablet, TAKE 1 TABLET BY MOUTH  EVERY OTHER DAY, Disp: 45 tablet, Rfl: 3 .  HYDROcodone-acetaminophen (NORCO/VICODIN) 5-325 MG tablet, Take 1-2 tablets by mouth every 6 (six) hours as needed for moderate pain., Disp: 60 tablet, Rfl: 0 .  HYDROmorphone (DILAUDID) 2 MG tablet, Take 1 tablet (2 mg total) by mouth every 4 (four) hours as needed for severe pain. (Patient not taking: Reported on 06/13/2018), Disp: 30 tablet, Rfl: 0 .  losartan (COZAAR) 100 MG tablet, TAKE 1 TABLET BY MOUTH ONCE DAILY, Disp: 90 tablet, Rfl: 3 .  metFORMIN (GLUCOPHAGE-XR) 500 MG 24 hr tablet, TAKE 2 TABLETS BY MOUTH ONCE DAILY WITH BREAKFAST (Patient taking differently: Take 1,000 mg by mouth daily with breakfast. take 2 tablets by mouth  once daily with BREAKFAST), Disp: 180 tablet, Rfl: 0 .  metoprolol succinate (TOPROL-XL) 100 MG 24 hr tablet, TAKE 1 TABLET BY MOUTH EVERY DAY, Disp: 90 tablet, Rfl: 0 .  Multiple Vitamins-Minerals (MULTIVITAMIN WITH MINERALS) tablet, Take 1 tablet by mouth daily., Disp: , Rfl:  .  oxyCODONE (OXY IR/ROXICODONE) 5 MG immediate release tablet, Take 1-2 tablets (5-10 mg total) by mouth every 4 (four) hours as needed for severe pain., Disp: 120 tablet, Rfl: 0 .  oxyCODONE-acetaminophen (PERCOCET) 10-325 MG tablet, Take 1 tablet by mouth every 4 (four) hours as needed for pain., Disp: 30 tablet, Rfl: 0 .  tamsulosin (FLOMAX) 0.4 MG CAPS capsule, Take 1 capsule (0.4 mg total) by mouth daily., Disp: 90 capsule, Rfl: 3  Allergies: No Known Allergies  Past Medical History, Surgical history, Social history, and Family History were reviewed and updated.  Review of Systems: Review of Systems  Constitutional: Negative.   HENT:  Negative.   Eyes: Negative.   Respiratory: Negative.   Cardiovascular: Negative.   Gastrointestinal: Negative.   Endocrine: Negative.   Genitourinary: Negative.    Musculoskeletal: Positive for back pain.  Skin: Negative.   Neurological: Negative.   Hematological: Negative.   Psychiatric/Behavioral: Negative.     Physical Exam:  weight is 172 lb (78 kg). His oral temperature is 98.7 F (37.1 C). His blood pressure is 142/50 (abnormal) and his pulse is 64. His respiration is 18.   Wt Readings from Last 3 Encounters:  06/23/18 172 lb (78 kg)  06/16/18 172 lb (78 kg)  06/14/18 173 lb 12.8 oz (78.8 kg)    Physical Exam  Constitutional: He is oriented to person, place, and time.  HENT:  Head: Normocephalic and atraumatic.  Mouth/Throat: Oropharynx is clear and moist.  Eyes: Pupils are equal, round, and reactive to light. EOM are normal.  Neck: Normal range of motion.  Cardiovascular: Normal rate, regular rhythm and normal heart sounds.  Pulmonary/Chest: Effort  normal and breath sounds normal.  Abdominal: Soft. Bowel sounds are normal.  Musculoskeletal: Normal range of motion. He exhibits no edema, tenderness or deformity.  Lymphadenopathy:    He has no cervical adenopathy.  Neurological: He is alert and oriented to person, place, and time.  Skin: Skin is warm and dry. No rash noted. No erythema.  Psychiatric: He has a normal mood and affect. His behavior is normal. Judgment and thought content normal.  Vitals reviewed.    Lab Results  Component Value Date   WBC 7.4 06/23/2018   HGB 12.5 (L) 06/23/2018   HCT 39.9 06/23/2018   MCV 91.5 06/23/2018   PLT 178 06/23/2018     Chemistry      Component Value Date/Time   NA 147 (H) 06/23/2018 1110   K 5.1 (H) 06/23/2018  1110   CL 113 (H) 06/23/2018 1110   CO2 29 06/23/2018 1110   BUN 30 (H) 06/23/2018 1110   CREATININE 1.40 (H) 06/23/2018 1110   CREATININE 1.11 04/29/2018 1047      Component Value Date/Time   CALCIUM 10.5 (H) 06/23/2018 1110   ALKPHOS 127 (H) 06/23/2018 1110   AST 30 06/23/2018 1110   ALT 27 06/23/2018 1110   BILITOT 0.8 06/23/2018 1110       Impression and Plan: [name] [name] is a 77 year old white male.  He has a past history of prostate cancer.  Apparently there is also a history of melanoma in situ.  Every now has metastatic kidney cancer.  I did send off the specimen for molecular profiling.  Hopefully, we will find that he has a PD-L1 level.  Our goal clearly is quality of life.  I talked to [name] [name] and his son about this.  I told him that given that he has stage IV kidney cancer, that we can treat this but that we are not going to cure it.  Our goal is to try to help with his pain so that he will be more functional and be able to enjoy activities of daily living.  I told him that we can certainly treat his cancer and hopefully decrease the amount of disease.  I probably would have to say that he has oligometastatic disease at least from his scans that were  done.  Hopefully, the axitinib/pembrolizumab combination will be helpful.  I would not start this until after he completes his radiation therapy.  He will have 10 radiation treatments.  They will start next week.  I would like to see him back on October 30.  We will then start his systemic therapy at that time.  I will make sure he has the MRI of the brain done before we see him back.  I spent about 40 minutes with he and his son.  I gave him information sheets about the axitinib and pembrolizumab.  I answered all their questions.  All the time was spent face-to-face with them.  [name], MD 10/3/201912:15 PM

## 2018-06-24 ENCOUNTER — Telehealth: Payer: Self-pay | Admitting: Pharmacist

## 2018-06-24 ENCOUNTER — Ambulatory Visit
Admission: RE | Admit: 2018-06-24 | Discharge: 2018-06-24 | Disposition: A | Discharge status: Home / Self Care | Payer: Medicare Other | Source: Ambulatory Visit | Attending: Radiation Oncology | Admitting: Radiation Oncology

## 2018-06-24 ENCOUNTER — Telehealth: Payer: Self-pay | Admitting: Pharmacy Technician

## 2018-06-24 ENCOUNTER — Ambulatory Visit: Payer: Medicare Other | Admitting: Radiation Oncology

## 2018-06-24 DIAGNOSIS — C641 Malignant neoplasm of right kidney, except renal pelvis: Secondary | ICD-10-CM | POA: Diagnosis not present

## 2018-06-24 DIAGNOSIS — C7951 Secondary malignant neoplasm of bone: Secondary | ICD-10-CM

## 2018-06-24 DIAGNOSIS — Z510 Encounter for antineoplastic radiation therapy: Secondary | ICD-10-CM | POA: Insufficient documentation

## 2018-06-24 NOTE — Telephone Encounter (Signed)
Oral Oncology Patient Advocate Encounter  Received notification from [employer] that prior authorization for Inlyta is required.  PA submitted on CoverMyMeds Key [id number] Status is pending  Oral Oncology Clinic will continue to follow.  [name] [name] Patient [place] Phone [phone] Fax [phone] 06/24/2018 4:05 PM

## 2018-06-24 NOTE — Telephone Encounter (Signed)
Oral Oncology Pharmacist Encounter  Received new prescription for Inlyta (axitinib) for the treatment of metastatic clear cell carcinoma of the kidney in conjunction with pembrolizumab, planned duration until disease progression or unacceptable drug toxicity. Planned to start following planned radiation therapy.  CMP from 06/23/18 assessed, no relevant lab abnormalities. 24hr Urine collected on 05/09/18 showed elevated urine protein, recommended collecting a UA for urine protein at next visit prior to starting. TSH ordered to be checking at next visit. Prescription dose and frequency assessed.   Current medication list in Epic reviewed, no DDIs with axitinib identified.  Prescription has been e-scribed to the [facility] for benefits analysis and approval.  Oral Oncology Clinic will continue to follow for insurance authorization, copayment issues, initial counseling and start date.  Darl Pikes, PharmD, BCPS, [facility] Hematology/Oncology Clinical Pharmacist [facility] [phone]  06/24/2018 11:18 AM

## 2018-06-24 NOTE — Telephone Encounter (Signed)
Oral Oncology Patient Advocate Encounter  Prior Authorization for [employer]th[employer] has been approved.    PA# [id number] Effective dates: 06/24/18 through 09/21/2019  Patients co-pay is $1387.32  Oral Oncology Clinic will continue to follow.   [facility] Phone [phone] Fax [phone] 06/24/2018 4:16 PM

## 2018-06-27 ENCOUNTER — Ambulatory Visit (INDEPENDENT_AMBULATORY_CARE_PROVIDER_SITE_OTHER): Payer: Medicare Other | Admitting: Family Medicine

## 2018-06-27 ENCOUNTER — Encounter: Payer: Self-pay | Admitting: Radiation Oncology

## 2018-06-27 ENCOUNTER — Other Ambulatory Visit: Payer: Self-pay | Admitting: *Deleted

## 2018-06-27 ENCOUNTER — Ambulatory Visit
Admission: RE | Admit: 2018-06-27 | Discharge: 2018-06-27 | Disposition: A | Discharge status: Home / Self Care | Payer: Medicare Other | Source: Ambulatory Visit | Attending: Radiation Oncology | Admitting: Radiation Oncology

## 2018-06-27 ENCOUNTER — Encounter: Payer: Self-pay | Admitting: Family Medicine

## 2018-06-27 VITALS — BP 130/53 | HR 83 | Ht 70.0 in | Wt 172.0 lb

## 2018-06-27 DIAGNOSIS — M4850XA Collapsed vertebra, not elsewhere classified, site unspecified, initial encounter for fracture: Secondary | ICD-10-CM

## 2018-06-27 DIAGNOSIS — C7951 Secondary malignant neoplasm of bone: Secondary | ICD-10-CM | POA: Diagnosis not present

## 2018-06-27 DIAGNOSIS — C641 Malignant neoplasm of right kidney, except renal pelvis: Secondary | ICD-10-CM | POA: Diagnosis not present

## 2018-06-27 DIAGNOSIS — I10 Essential (primary) hypertension: Secondary | ICD-10-CM

## 2018-06-27 DIAGNOSIS — Z510 Encounter for antineoplastic radiation therapy: Secondary | ICD-10-CM | POA: Diagnosis not present

## 2018-06-27 DIAGNOSIS — E119 Type 2 diabetes mellitus without complications: Secondary | ICD-10-CM

## 2018-06-27 LAB — POCT GLYCOSYLATED HEMOGLOBIN (HGB A1C): HEMOGLOBIN A1C: 5.6 % (ref 4.0–5.6)

## 2018-06-27 MED ORDER — GLIPIZIDE ER 2.5 MG PO TB24: 2.5000 mg | ORAL_TABLET | Freq: Every day | ORAL | 1 refills | Status: DC

## 2018-06-27 NOTE — Progress Notes (Signed)
Pt's treatment changed from SRS/T to conventional due to progression on planning scan.

## 2018-06-27 NOTE — Patient Instructions (Signed)
Go down to 2.5 mg on your glipizide.

## 2018-06-27 NOTE — Progress Notes (Signed)
Subjective:    CC: DM  HPI:  Diabetes - no hypoglycemic events. No wounds or sores that are not healing well. No increased thirst or urination. Checking glucose at home. Taking medications as prescribed without any side effects.  Is on glipizide and metformin.  Hypertension- Pt denies chest pain, SOB, dizziness, or heart palpitations.  Taking meds as directed w/o problems.  Denies medication side effects.    Neoplasm of kidney-he actually starts radiation therapy today.  He is being followed at [facility] with Dr. Burney Gauze.  He has had some significant back pain so has actually been taking oxycodone IR which she says seems to work better than anything else that he is been given thus far.  Past medical history, Surgical history, Family history not pertinant except as noted below, Social history, Allergies, and medications have been entered into the medical record, reviewed, and corrections made.   Review of Systems: No fevers, chills, night sweats, weight loss, chest pain, or shortness of breath.   Objective:    General: Well Developed, well nourished, and in no acute distress.  Neuro: Alert and oriented x3, extra-ocular muscles intact, sensation grossly intact.  HEENT: Normocephalic, atraumatic  Skin: Warm and dry, no rashes. Cardiac: Regular rate and rhythm, no murmurs rubs or gallops, no lower extremity edema.  Respiratory: Clear to auscultation bilaterally. Not using accessory muscles, speaking in full sentences.   Impression and Recommendations:    DM - A1C of 5.6.  Well controlled. Continue current regimen. Follow up in  4 months.   HTN - Well controlled. Continue current regimen. Follow up in  42months.    Neoplasm of kidney-followed by Dr. Marin Olp will start radiation therapy today.  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  He wanted to know if we would be writing that or if the  cancer doctor would be writing that.  He has talked to his son for end of life care.

## 2018-06-28 ENCOUNTER — Ambulatory Visit
Admission: RE | Admit: 2018-06-28 | Discharge: 2018-06-28 | Disposition: A | Discharge status: Home / Self Care | Payer: Medicare Other | Source: Ambulatory Visit | Attending: Radiation Oncology | Admitting: Radiation Oncology

## 2018-06-28 DIAGNOSIS — C641 Malignant neoplasm of right kidney, except renal pelvis: Secondary | ICD-10-CM | POA: Diagnosis not present

## 2018-06-28 DIAGNOSIS — Z510 Encounter for antineoplastic radiation therapy: Secondary | ICD-10-CM | POA: Diagnosis not present

## 2018-06-29 ENCOUNTER — Ambulatory Visit
Admission: RE | Admit: 2018-06-29 | Discharge: 2018-06-29 | Disposition: A | Discharge status: Home / Self Care | Payer: Medicare Other | Source: Ambulatory Visit | Attending: Radiation Oncology | Admitting: Radiation Oncology

## 2018-06-29 ENCOUNTER — Telehealth: Payer: Self-pay | Admitting: Pharmacist

## 2018-06-29 ENCOUNTER — Encounter: Payer: Self-pay | Admitting: Hematology & Oncology

## 2018-06-29 DIAGNOSIS — C641 Malignant neoplasm of right kidney, except renal pelvis: Secondary | ICD-10-CM | POA: Diagnosis not present

## 2018-06-29 DIAGNOSIS — Z510 Encounter for antineoplastic radiation therapy: Secondary | ICD-10-CM | POA: Diagnosis not present

## 2018-06-29 NOTE — Telephone Encounter (Signed)
Oral Chemotherapy Pharmacist Encounter  Successfully enrolled patient for copayment assistance funds from [place] from the Renal fund.  Award amount: $7,000 Effective dates: 06/29/18 - 06/30/19 Patient ID: [id number] Group #: [id number] BIN: [id number] PCN: [id number]  Billing information will be shared with [place]. I will place a copy of the award letter to be scanned into patient's chart.  Darl Pikes, PharmD, BCPS, [facility] Hematology/Oncology Clinical Pharmacist [facility] [phone]  06/29/2018 11:46 AM

## 2018-06-30 ENCOUNTER — Ambulatory Visit
Admission: RE | Admit: 2018-06-30 | Discharge: 2018-06-30 | Disposition: A | Discharge status: Home / Self Care | Payer: Medicare Other | Source: Ambulatory Visit | Attending: Radiation Oncology | Admitting: Radiation Oncology

## 2018-06-30 ENCOUNTER — Encounter (HOSPITAL_COMMUNITY): Payer: Self-pay | Admitting: Hematology & Oncology

## 2018-06-30 DIAGNOSIS — Z510 Encounter for antineoplastic radiation therapy: Secondary | ICD-10-CM | POA: Diagnosis not present

## 2018-06-30 DIAGNOSIS — C641 Malignant neoplasm of right kidney, except renal pelvis: Secondary | ICD-10-CM | POA: Diagnosis not present

## 2018-07-01 ENCOUNTER — Ambulatory Visit
Admission: RE | Admit: 2018-07-01 | Discharge: 2018-07-01 | Disposition: A | Discharge status: Home / Self Care | Payer: Medicare Other | Source: Ambulatory Visit | Attending: Radiation Oncology | Admitting: Radiation Oncology

## 2018-07-01 DIAGNOSIS — C641 Malignant neoplasm of right kidney, except renal pelvis: Secondary | ICD-10-CM | POA: Diagnosis not present

## 2018-07-01 DIAGNOSIS — Z510 Encounter for antineoplastic radiation therapy: Secondary | ICD-10-CM | POA: Diagnosis not present

## 2018-07-01 DIAGNOSIS — C7951 Secondary malignant neoplasm of bone: Secondary | ICD-10-CM | POA: Diagnosis not present

## 2018-07-04 ENCOUNTER — Telehealth: Payer: Self-pay | Admitting: Pharmacist

## 2018-07-04 ENCOUNTER — Ambulatory Visit
Admission: RE | Admit: 2018-07-04 | Discharge: 2018-07-04 | Disposition: A | Discharge status: Home / Self Care | Payer: Medicare Other | Source: Ambulatory Visit | Attending: Radiation Oncology | Admitting: Radiation Oncology

## 2018-07-04 DIAGNOSIS — C641 Malignant neoplasm of right kidney, except renal pelvis: Secondary | ICD-10-CM | POA: Diagnosis not present

## 2018-07-04 DIAGNOSIS — Z510 Encounter for antineoplastic radiation therapy: Secondary | ICD-10-CM | POA: Diagnosis not present

## 2018-07-04 NOTE — Telephone Encounter (Signed)
Oral Chemotherapy Pharmacist Encounter  Planned start 07/21/18, following completion of radiation and in coordination with the start of pembrolizumab.   Patient Education I spoke with patient for overview of new oral chemotherapy medication: Inlyta (axitinib) for the treatment of metastatic clear cell carcinoma of the kidney in conjunction with pembrolizumab, planned duration until disease progression or unacceptable drug toxicity. Planned to start following planned radiation therapy.   Counseled patient on administration, dosing, side effects, monitoring, drug-food interactions, safe handling, storage, and disposal. Patient will take 1 tablet (5 mg) twice a day for 21 days on then 7 days off.  Side effects include but not limited to: increased BP, N/V/D, decreased wbc/hgb, fatigue.    Reviewed with patient importance of keeping a medication schedule and plan for any missed doses.  Mr. Vallone voiced understanding and appreciation. All questions answered. Medication handout placed in the mail.  Provided patient with [facility] phone number. Patient knows to call the office with questions or concerns. Oral Chemotherapy [facility] Clinic will continue to follow.  Darl Pikes, PharmD, BCPS, [facility] Hematology/Oncology Clinical Pharmacist [facility] [place] [facility] [phone]  07/04/2018 9:50 AM

## 2018-07-05 ENCOUNTER — Ambulatory Visit
Admission: RE | Admit: 2018-07-05 | Discharge: 2018-07-05 | Disposition: A | Discharge status: Home / Self Care | Payer: Medicare Other | Source: Ambulatory Visit | Attending: Radiation Oncology | Admitting: Radiation Oncology

## 2018-07-05 DIAGNOSIS — C641 Malignant neoplasm of right kidney, except renal pelvis: Secondary | ICD-10-CM | POA: Diagnosis not present

## 2018-07-05 DIAGNOSIS — Z510 Encounter for antineoplastic radiation therapy: Secondary | ICD-10-CM | POA: Diagnosis not present

## 2018-07-06 ENCOUNTER — Ambulatory Visit
Admission: RE | Admit: 2018-07-06 | Discharge: 2018-07-06 | Disposition: A | Discharge status: Home / Self Care | Payer: Medicare Other | Source: Ambulatory Visit | Attending: Radiation Oncology | Admitting: Radiation Oncology

## 2018-07-06 ENCOUNTER — Other Ambulatory Visit: Payer: Self-pay | Admitting: Family Medicine

## 2018-07-06 DIAGNOSIS — C641 Malignant neoplasm of right kidney, except renal pelvis: Secondary | ICD-10-CM | POA: Diagnosis not present

## 2018-07-06 DIAGNOSIS — Z510 Encounter for antineoplastic radiation therapy: Secondary | ICD-10-CM | POA: Diagnosis not present

## 2018-07-07 ENCOUNTER — Ambulatory Visit
Admission: RE | Admit: 2018-07-07 | Discharge: 2018-07-07 | Disposition: A | Discharge status: Home / Self Care | Payer: Medicare Other | Source: Ambulatory Visit | Attending: Radiation Oncology | Admitting: Radiation Oncology

## 2018-07-07 DIAGNOSIS — Z510 Encounter for antineoplastic radiation therapy: Secondary | ICD-10-CM | POA: Diagnosis not present

## 2018-07-07 DIAGNOSIS — C641 Malignant neoplasm of right kidney, except renal pelvis: Secondary | ICD-10-CM | POA: Diagnosis not present

## 2018-07-08 ENCOUNTER — Other Ambulatory Visit: Payer: Self-pay | Admitting: Radiation Oncology

## 2018-07-08 ENCOUNTER — Ambulatory Visit
Admission: RE | Admit: 2018-07-08 | Discharge: 2018-07-08 | Disposition: A | Discharge status: Home / Self Care | Payer: Medicare Other | Source: Ambulatory Visit | Attending: Radiation Oncology | Admitting: Radiation Oncology

## 2018-07-08 ENCOUNTER — Encounter: Payer: Self-pay | Admitting: Radiation Oncology

## 2018-07-08 DIAGNOSIS — Z510 Encounter for antineoplastic radiation therapy: Secondary | ICD-10-CM | POA: Diagnosis not present

## 2018-07-08 DIAGNOSIS — C7951 Secondary malignant neoplasm of bone: Secondary | ICD-10-CM | POA: Diagnosis not present

## 2018-07-08 DIAGNOSIS — C641 Malignant neoplasm of right kidney, except renal pelvis: Secondary | ICD-10-CM | POA: Diagnosis not present

## 2018-07-08 MED ORDER — OXYCODONE HCL 5 MG PO TABS: 5.0000 mg | ORAL_TABLET | Freq: Four times a day (QID) | ORAL | 0 refills | Status: DC | PRN

## 2018-07-16 ENCOUNTER — Ambulatory Visit (HOSPITAL_BASED_OUTPATIENT_CLINIC_OR_DEPARTMENT_OTHER)
Admission: RE | Admit: 2018-07-16 | Discharge: 2018-07-16 | Disposition: A | Discharge status: Home / Self Care | Payer: Medicare Other | Source: Ambulatory Visit | Attending: Family | Admitting: Family

## 2018-07-16 DIAGNOSIS — C7951 Secondary malignant neoplasm of bone: Secondary | ICD-10-CM | POA: Diagnosis not present

## 2018-07-16 DIAGNOSIS — C649 Malignant neoplasm of unspecified kidney, except renal pelvis: Secondary | ICD-10-CM | POA: Diagnosis not present

## 2018-07-16 DIAGNOSIS — D49519 Neoplasm of unspecified behavior of unspecified kidney: Secondary | ICD-10-CM | POA: Diagnosis not present

## 2018-07-16 MED ORDER — GADOBENATE DIMEGLUMINE 529 MG/ML IV SOLN
15.0000 mL | Freq: Once | INTRAVENOUS | Status: AC | PRN
  Administered 2018-07-16: 15 mL via INTRAVENOUS

## 2018-07-18 ENCOUNTER — Telehealth: Payer: Self-pay | Admitting: *Deleted

## 2018-07-18 ENCOUNTER — Other Ambulatory Visit: Payer: Self-pay | Admitting: Family Medicine

## 2018-07-18 MED FILL — INLYTA 5 MG TABLET: 5 | 28 days supply | Qty: 42 | Fill #0

## 2018-07-18 NOTE — Telephone Encounter (Signed)
As noted below by Dr. Marin Olp, I informed the patient that his brain MRI is normal. He verbalized understanding.

## 2018-07-18 NOTE — Telephone Encounter (Signed)
-----   Message from [name] Napoleon, MD sent at 07/18/2018  2:20 PM EDT ----- Call - the brain MRI is normal!!  [name] [name]

## 2018-07-19 ENCOUNTER — Telehealth: Payer: Self-pay

## 2018-07-19 NOTE — Telephone Encounter (Signed)
Pt states he was told to call Dr Madilyn Fireman when he was about out of his Anoro and his ProAir inhalers, in hopes office would be able to get him samples or help with this medication.   Barnet Pall, is this something you can help with?

## 2018-07-20 NOTE — Telephone Encounter (Signed)
Can you call the drug representative and see if we can get some samples of Anoro Ellipta 62.5-25 MCG and Provento; HFA 108 mcg inhalers? Thanks

## 2018-07-21 ENCOUNTER — Inpatient Hospital Stay: Payer: Medicare Other

## 2018-07-21 ENCOUNTER — Inpatient Hospital Stay (HOSPITAL_BASED_OUTPATIENT_CLINIC_OR_DEPARTMENT_OTHER): Payer: Medicare Other | Admitting: Hematology & Oncology

## 2018-07-21 DIAGNOSIS — C7951 Secondary malignant neoplasm of bone: Secondary | ICD-10-CM | POA: Diagnosis not present

## 2018-07-21 DIAGNOSIS — C641 Malignant neoplasm of right kidney, except renal pelvis: Secondary | ICD-10-CM

## 2018-07-21 DIAGNOSIS — Z8546 Personal history of malignant neoplasm of prostate: Secondary | ICD-10-CM | POA: Diagnosis not present

## 2018-07-21 DIAGNOSIS — Z5111 Encounter for antineoplastic chemotherapy: Secondary | ICD-10-CM | POA: Diagnosis not present

## 2018-07-21 DIAGNOSIS — Z86006 Personal history of melanoma in-situ: Secondary | ICD-10-CM | POA: Diagnosis not present

## 2018-07-21 DIAGNOSIS — Z79899 Other long term (current) drug therapy: Secondary | ICD-10-CM | POA: Diagnosis not present

## 2018-07-21 DIAGNOSIS — E039 Hypothyroidism, unspecified: Secondary | ICD-10-CM

## 2018-07-21 LAB — CBC WITH DIFFERENTIAL (CANCER CENTER ONLY)
ABS IMMATURE GRANULOCYTES: 0.02 10*3/uL (ref 0.00–0.07)
BASOS PCT: 1 %
Basophils Absolute: 0 10*3/uL (ref 0.0–0.1)
EOS ABS: 0.1 10*3/uL (ref 0.0–0.5)
Eosinophils Relative: 2 %
HCT: 41.2 % (ref 39.0–52.0)
Hemoglobin: 12.8 g/dL — ABNORMAL LOW (ref 13.0–17.0)
Immature Granulocytes: 0 %
Lymphocytes Relative: 16 %
Lymphs Abs: 0.9 10*3/uL (ref 0.7–4.0)
MCH: 28 pg (ref 26.0–34.0)
MCHC: 31.1 g/dL (ref 30.0–36.0)
MCV: 90.2 fL (ref 80.0–100.0)
MONO ABS: 0.5 10*3/uL (ref 0.1–1.0)
MONOS PCT: 9 %
Neutro Abs: 4.1 10*3/uL (ref 1.7–7.7)
Neutrophils Relative %: 72 %
PLATELETS: 176 10*3/uL (ref 150–400)
RBC: 4.57 MIL/uL (ref 4.22–5.81)
RDW: 14.9 % (ref 11.5–15.5)
WBC Count: 5.7 10*3/uL (ref 4.0–10.5)
nRBC: 0 % (ref 0.0–0.2)

## 2018-07-21 LAB — CMP (CANCER CENTER ONLY)
ALBUMIN: 3.9 g/dL (ref 3.5–5.0)
ALT: 26 U/L (ref 10–47)
AST: 31 U/L (ref 11–38)
Alkaline Phosphatase: 120 U/L — ABNORMAL HIGH (ref 26–84)
Anion gap: 8 (ref 5–15)
BILIRUBIN TOTAL: 0.9 mg/dL (ref 0.2–1.6)
BUN: 27 mg/dL — ABNORMAL HIGH (ref 7–22)
CALCIUM: 10.3 mg/dL (ref 8.0–10.3)
CO2: 29 mmol/L (ref 18–33)
CREATININE: 1.1 mg/dL (ref 0.60–1.20)
Chloride: 105 mmol/L (ref 98–108)
Glucose, Bld: 131 mg/dL — ABNORMAL HIGH (ref 73–118)
Potassium: 4 mmol/L (ref 3.3–4.7)
Sodium: 142 mmol/L (ref 128–145)
Total Protein: 7 g/dL (ref 6.4–8.1)

## 2018-07-21 LAB — LACTATE DEHYDROGENASE: LDH: 172 U/L (ref 98–192)

## 2018-07-21 LAB — TOTAL PROTEIN, URINE DIPSTICK

## 2018-07-21 MED ORDER — SODIUM CHLORIDE 0.9 % IV SOLN
Freq: Once | INTRAVENOUS | Status: AC
  Administered 2018-07-21: 14:00:00 via INTRAVENOUS
  Filled 2018-07-21: qty 250

## 2018-07-21 MED ORDER — SODIUM CHLORIDE 0.9 % IV SOLN
200.0000 mg | Freq: Once | INTRAVENOUS | Status: AC
  Administered 2018-07-21: 200 mg via INTRAVENOUS
  Filled 2018-07-21: qty 8

## 2018-07-21 MED ORDER — OXYCODONE HCL ER 15 MG PO T12A: 15.0000 mg | EXTENDED_RELEASE_TABLET | Freq: Two times a day (BID) | ORAL | 0 refills | Status: DC

## 2018-07-21 MED ORDER — OXYCODONE HCL 10 MG PO TABS: 10.0000 mg | ORAL_TABLET | ORAL | 0 refills | Status: DC | PRN

## 2018-07-21 NOTE — Telephone Encounter (Signed)
Have we been able to get any samples?
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
## 2018-07-21 NOTE — Patient Instructions (Signed)
Pembrolizumab injection  What is this medicine?  PEMBROLIZUMAB (pem broe liz ue mab) is a monoclonal antibody. It is used to treat melanoma, head and neck cancer, Hodgkin lymphoma, non-small cell lung cancer, urothelial cancer, stomach cancer, and cancers that have a certain genetic condition.  This medicine may be used for other purposes; ask your health care provider or pharmacist if you have questions.  COMMON BRAND NAME(S): Keytruda  What should I tell my health care provider before I take this medicine?  They need to know if you have any of these conditions:  -diabetes  -immune system problems  -inflammatory bowel disease  -liver disease  -lung or breathing disease  -lupus  -organ transplant  -an unusual or allergic reaction to pembrolizumab, other medicines, foods, dyes, or preservatives  -pregnant or trying to get pregnant  -breast-feeding  How should I use this medicine?  This medicine is for infusion into a vein. It is given by a health care professional in a hospital or clinic setting.  A special MedGuide will be given to you before each treatment. Be sure to read this information carefully each time.  Talk to your pediatrician regarding the use of this medicine in children. While this drug may be prescribed for selected conditions, precautions do apply.  Overdosage: If you think you have taken too much of this medicine contact a poison control center or emergency room at once.  NOTE: This medicine is only for you. Do not share this medicine with others.  What if I miss a dose?  It is important not to miss your dose. Call your doctor or health care professional if you are unable to keep an appointment.  What may interact with this medicine?  Interactions have not been studied.  Give your health care provider a list of all the medicines, herbs, non-prescription drugs, or dietary supplements you use. Also tell them if you smoke, drink alcohol, or use illegal drugs. Some items may interact with your  medicine.  This list may not describe all possible interactions. Give your health care provider a list of all the medicines, herbs, non-prescription drugs, or dietary supplements you use. Also tell them if you smoke, drink alcohol, or use illegal drugs. Some items may interact with your medicine.  What should I watch for while using this medicine?  Your condition will be monitored carefully while you are receiving this medicine.  You may need blood work done while you are taking this medicine.  Do not become pregnant while taking this medicine or for 4 months after stopping it. Women should inform their doctor if they wish to become pregnant or think they might be pregnant. There is a potential for serious side effects to an unborn child. Talk to your health care professional or pharmacist for more information. Do not breast-feed an infant while taking this medicine or for 4 months after the last dose.  What side effects may I notice from receiving this medicine?  Side effects that you should report to your doctor or health care professional as soon as possible:  -allergic reactions like skin rash, itching or hives, swelling of the face, lips, or tongue  -bloody or black, tarry  -breathing problems  -changes in vision  -chest pain  -chills  -constipation  -cough  -dizziness or feeling faint or lightheaded  -fast or irregular heartbeat  -fever  -flushing  -hair loss  -low blood counts - this medicine may decrease the number of white blood cells, red blood cells   and platelets. You may be at increased risk for infections and bleeding.  -muscle pain  -muscle weakness  -persistent headache  -signs and symptoms of high blood sugar such as dizziness; dry mouth; dry skin; fruity breath; nausea; stomach pain; increased hunger or thirst; increased urination  -signs and symptoms of kidney injury like trouble passing urine or change in the amount of urine  -signs and symptoms of liver injury like dark urine, light-colored  stools, loss of appetite, nausea, right upper belly pain, yellowing of the eyes or skin  -stomach pain  -sweating  -weight loss  Side effects that usually do not require medical attention (report to your doctor or health care professional if they continue or are bothersome):  -decreased appetite  -diarrhea  -tiredness  This list may not describe all possible side effects. Call your doctor for medical advice about side effects. You may report side effects to FDA at 1-800-FDA-1088.  Where should I keep my medicine?  This drug is given in a hospital or clinic and will not be stored at home.  NOTE: This sheet is a summary. It may not cover all possible information. If you have questions about this medicine, talk to your doctor, pharmacist, or health care provider.   2018 Elsevier/Gold Standard (2016-06-16 12:29:36)

## 2018-07-22 ENCOUNTER — Other Ambulatory Visit: Payer: Self-pay | Admitting: Family Medicine

## 2018-07-22 LAB — TSH: TSH: 1.199 u[IU]/mL (ref 0.350–4.500)

## 2018-07-27 NOTE — Telephone Encounter (Signed)
Left message for drug representative to call back.

## 2018-07-27 NOTE — Telephone Encounter (Signed)
[name] [name], can you please help with this

## 2018-07-28 ENCOUNTER — Telehealth: Payer: Self-pay | Admitting: *Deleted

## 2018-07-28 NOTE — Telephone Encounter (Signed)
Pt left message stating that he had two episodes of diarrhea and spoke with "nurse in [place] yesterday evening and she called in a medicine for diarrhea" which he threw up and would like to know what to do next.  Call placed back to patient and patient states that he had diarrhea twice yesterday and once today.  He states that he has taken Imodium twice today with no nausea, pain, fever, chills.  He states that he has not eaten at all today because he has been busy working. Pt instructed to eat now and to call office back tomorrow if any further nausea or diarrhea.  Pt appreciative of call back and has no further questions at this time.

## 2018-07-29 ENCOUNTER — Other Ambulatory Visit: Payer: Self-pay | Admitting: *Deleted

## 2018-07-29 ENCOUNTER — Other Ambulatory Visit: Payer: Self-pay | Admitting: Hematology & Oncology

## 2018-07-29 ENCOUNTER — Telehealth: Payer: Self-pay | Admitting: *Deleted

## 2018-07-29 DIAGNOSIS — C7951 Secondary malignant neoplasm of bone: Secondary | ICD-10-CM

## 2018-07-29 DIAGNOSIS — C641 Malignant neoplasm of right kidney, except renal pelvis: Secondary | ICD-10-CM

## 2018-07-29 MED ORDER — PROCHLORPERAZINE MALEATE 10 MG PO TABS: 10.0000 mg | ORAL_TABLET | Freq: Four times a day (QID) | ORAL | 1 refills | Status: DC | PRN

## 2018-07-29 NOTE — Telephone Encounter (Signed)
Received voice mail message from son, [name] [name], stating,"dad started Inlyta last Friday, 07/22/18, and also getting Keytruda (D1,C1 07/21/18). This pass Wednesday he started with diarrhea and vomiting. He has taken Imodium but not like he should. Today, there is no vomiting. Appetite is decreased. He has eaten toast and some eggs. Drinking chicken broth now."  Per Dr. Marin [name], HOLD the [facility] for now, labs on Tuesday, 11/12 and [name] Peace, NP, afterwards. Please E-scribe Compazine to his pharmacy. [name] [name] verbalized understanding.

## 2018-07-31 ENCOUNTER — Other Ambulatory Visit: Payer: Self-pay | Admitting: Family Medicine

## 2018-08-02 ENCOUNTER — Ambulatory Visit (INDEPENDENT_AMBULATORY_CARE_PROVIDER_SITE_OTHER): Payer: Medicare Other

## 2018-08-02 ENCOUNTER — Ambulatory Visit (INDEPENDENT_AMBULATORY_CARE_PROVIDER_SITE_OTHER): Payer: Medicare Other | Admitting: Family Medicine

## 2018-08-02 ENCOUNTER — Encounter: Payer: Self-pay | Admitting: Family Medicine

## 2018-08-02 ENCOUNTER — Other Ambulatory Visit: Payer: Self-pay

## 2018-08-02 ENCOUNTER — Inpatient Hospital Stay (HOSPITAL_BASED_OUTPATIENT_CLINIC_OR_DEPARTMENT_OTHER): Payer: Medicare Other | Admitting: Family

## 2018-08-02 ENCOUNTER — Encounter: Payer: Self-pay | Admitting: Family

## 2018-08-02 ENCOUNTER — Inpatient Hospital Stay: Payer: Medicare Other | Attending: Hematology & Oncology

## 2018-08-02 VITALS — BP 130/47 | HR 57 | Temp 97.9°F | Resp 17 | Wt 159.0 lb

## 2018-08-02 VITALS — BP 137/53 | HR 72 | Wt 160.0 lb

## 2018-08-02 DIAGNOSIS — D630 Anemia in neoplastic disease: Secondary | ICD-10-CM | POA: Diagnosis not present

## 2018-08-02 DIAGNOSIS — M4850XS Collapsed vertebra, not elsewhere classified, site unspecified, sequela of fracture: Secondary | ICD-10-CM | POA: Diagnosis not present

## 2018-08-02 DIAGNOSIS — M79652 Pain in left thigh: Secondary | ICD-10-CM

## 2018-08-02 DIAGNOSIS — R634 Abnormal weight loss: Secondary | ICD-10-CM

## 2018-08-02 DIAGNOSIS — C641 Malignant neoplasm of right kidney, except renal pelvis: Secondary | ICD-10-CM

## 2018-08-02 DIAGNOSIS — M5414 Radiculopathy, thoracic region: Secondary | ICD-10-CM | POA: Diagnosis not present

## 2018-08-02 DIAGNOSIS — C7951 Secondary malignant neoplasm of bone: Secondary | ICD-10-CM

## 2018-08-02 DIAGNOSIS — G893 Neoplasm related pain (acute) (chronic): Secondary | ICD-10-CM | POA: Diagnosis not present

## 2018-08-02 DIAGNOSIS — Z5112 Encounter for antineoplastic immunotherapy: Secondary | ICD-10-CM | POA: Diagnosis not present

## 2018-08-02 DIAGNOSIS — R630 Anorexia: Secondary | ICD-10-CM | POA: Diagnosis not present

## 2018-08-02 DIAGNOSIS — C801 Malignant (primary) neoplasm, unspecified: Secondary | ICD-10-CM | POA: Diagnosis not present

## 2018-08-02 DIAGNOSIS — M1612 Unilateral primary osteoarthritis, left hip: Secondary | ICD-10-CM | POA: Diagnosis not present

## 2018-08-02 LAB — CBC WITH DIFFERENTIAL (CANCER CENTER ONLY)
Abs Immature Granulocytes: 0.03 10*3/uL (ref 0.00–0.07)
BASOS ABS: 0.1 10*3/uL (ref 0.0–0.1)
Basophils Relative: 1 %
EOS PCT: 1 %
Eosinophils Absolute: 0.1 10*3/uL (ref 0.0–0.5)
HCT: 41.3 % (ref 39.0–52.0)
HEMOGLOBIN: 13.2 g/dL (ref 13.0–17.0)
IMMATURE GRANULOCYTES: 0 %
LYMPHS PCT: 14 %
Lymphs Abs: 1 10*3/uL (ref 0.7–4.0)
MCH: 28.6 pg (ref 26.0–34.0)
MCHC: 32 g/dL (ref 30.0–36.0)
MCV: 89.6 fL (ref 80.0–100.0)
Monocytes Absolute: 0.5 10*3/uL (ref 0.1–1.0)
Monocytes Relative: 6 %
NEUTROS ABS: 5.9 10*3/uL (ref 1.7–7.7)
Neutrophils Relative %: 78 %
Platelet Count: 191 10*3/uL (ref 150–400)
RBC: 4.61 MIL/uL (ref 4.22–5.81)
RDW: 15.2 % (ref 11.5–15.5)
WBC Count: 7.5 10*3/uL (ref 4.0–10.5)
nRBC: 0 % (ref 0.0–0.2)

## 2018-08-02 MED ORDER — GABAPENTIN 300 MG PO CAPS: ORAL_CAPSULE | ORAL | 3 refills | Status: DC

## 2018-08-02 MED ORDER — DRONABINOL 2.5 MG PO CAPS: 2.5000 mg | ORAL_CAPSULE | Freq: Two times a day (BID) | ORAL | 1 refills | Status: DC

## 2018-08-02 NOTE — Progress Notes (Signed)
Hematology and Oncology Follow Up Visit  ALEKS NAWROT [id number] Nov 10, 1940 77 y.o. 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 Amero is here today with his son for follow-up. Bartholomew Boards is currently on hold due to diarrhea and loss of appetite. The diarrhea, nausea and vomiting have resolved.  He still does not have much of an appetite. He started drinking one Boost a day several days ago. He is hydrating well. His weight is down 13 lbs since the beginning of October. He is still having left hip and leg pain. CT scan today showed lytic metastases involving the posterior left acetabulum and the proximal left femur appear minimally larger he also had lower lumbar spondylosis with biforaminal narrowing at L4-5.  His Oxycontin was denied by insurance so we have changed him to [facility]. Once he starts the [facility] he will hold the Oxycodone for breakthrough pain. Her verbalized understanding.  His creatinine was elevated at 2.28 and BUN 55. He will increase his fluid intake and we will re-evaluate at his follow-up next week.  No fever, chills, n/v, cough, rash, dizziness, SOB, chest pain, palpitations, abdominal pain or changes in bowel or bladder habits.  No swelling, tenderness, numbness or tingling in his extremities.  He state that Dr. Georgina Snell is working on getting approval for in home PT to help with strengthening.  No lymphadenopathy noted on exam.   ECOG Performance Status: 1 - Symptomatic but completely ambulatory  Medications:  Allergies as of 08/02/2018   No Known Allergies     Medication List        Accurate as of 08/02/18 10:50 PM. Always use your most recent med list.          amLODipine 10  MG tablet Commonly known as:  NORVASC TAKE 1 TABLET(10 MG) BY MOUTH DAILY. FOLLOW UP VISIT WITH PCP   ANORO ELLIPTA 62.5-25 MCG/INH Aepb Generic drug:  umeclidinium-vilanterol INHALE 1 PUFF BY MOUTH EVERY DAY   aspirin EC 81 MG tablet Take 1 tablet (81 mg total) by mouth daily.   atorvastatin 40 MG tablet Commonly known as:  LIPITOR Take 1 tablet (40 mg total) by mouth daily at 6 PM.   axitinib 5 MG tablet Commonly known as:  INLYTA Take 1 pill twice a day for 21 days on then 7 days off!!!   clopidogrel 75 MG tablet Commonly known as:  PLAVIX TAKE 1 TABLET BY MOUTH ONCE DAILY   CONTOUR BLOOD GLUCOSE SYSTEM Devi Diagnoses - Diabetes E 11.8 Check blood sugar once daily   doxazosin 4 MG tablet Commonly known as:  CARDURA TAKE 1 TABLET BY MOUTH AT BEDTIME   dronabinol 2.5 MG capsule Commonly known as:  MARINOL Take 1 capsule (2.5 mg total) by mouth 2 (two) times daily before a meal.   gabapentin 300 MG capsule Commonly known as:  NEURONTIN One tab PO qHS for a week, then BID for a week, then TID. May double weekly to a max of 3,681m/day   glipiZIDE 2.5 MG 24 hr tablet Commonly known as:  GLUCOTROL XL Take 1 tablet (2.5 mg total) by mouth daily with breakfast.   glucose blood test strip Diagnoses - Diabetes E 11.8 Check blood sugar  once daily   hydrochlorothiazide 25 MG tablet Commonly known as:  HYDRODIURIL TAKE 1 TABLET BY MOUTH EVERY OTHER DAY   losartan 100 MG tablet Commonly known as:  COZAAR TAKE 1 TABLET BY MOUTH ONCE DAILY   metFORMIN 500 MG 24 hr tablet Commonly known as:  GLUCOPHAGE-XR TAKE 2 TABLETS BY MOUTH EVERY DAY WITH BREAKFAST   metoprolol succinate 100 MG 24 hr tablet Commonly known as:  TOPROL-XL TAKE 1 TABLET BY MOUTH EVERY DAY   multivitamin with minerals tablet Take 1 tablet by mouth daily.   Oxycodone HCl 10 MG Tabs Take 1 tablet (10 mg total) by mouth every 4 (four) hours as needed for severe pain.   oxyCODONE 15 mg 12 hr  tablet Commonly known as:  OXYCONTIN Take 1 tablet (15 mg total) by mouth every 12 (twelve) hours.   PROAIR HFA 108 (90 Base) MCG/ACT inhaler Generic drug:  albuterol INHALE 2 PUFFS INTO THE LUNGS EVERY 6 HOURS AS NEEDED FOR WHEEZING   prochlorperazine 10 MG tablet Commonly known as:  COMPAZINE TAKE 1 TABLET(10 MG) BY MOUTH EVERY 6 HOURS AS NEEDED FOR NAUSEA OR VOMITING   tamsulosin 0.4 MG Caps capsule Commonly known as:  FLOMAX Take 1 capsule (0.4 mg total) by mouth daily.       Allergies: No Known Allergies  Past Medical History, Surgical history, Social history, and Family History were reviewed and updated.  Review of Systems: All other 10 point review of systems is negative.   Physical Exam:  weight is 159 lb (72.1 kg). His oral temperature is 97.9 F (36.6 C). His blood pressure is 130/47 (abnormal) and his pulse is 57 (abnormal). His respiration is 17 and oxygen saturation is 93%.   Wt Readings from Last 3 Encounters:  08/02/18 159 lb (72.1 kg)  08/02/18 160 lb (72.6 kg)  06/27/18 172 lb (78 kg)    Ocular: Sclerae unicteric, pupils equal, round and reactive to light Ear-nose-throat: Oropharynx clear, dentition fair Lymphatic: No cervical, supraclavicular or axillary adenopathy Lungs no rales or rhonchi, good excursion bilaterally Heart regular rate and rhythm, no murmur appreciated Abd soft, nontender, positive bowel sounds, no liver or spleen tip palpated on exam, no fluid wave  MSK no focal spinal tenderness, no joint edema Neuro: non-focal, well-oriented, appropriate affect Breasts: Deferred   Lab Results  Component Value Date   WBC 7.5 08/02/2018   HGB 13.2 08/02/2018   HCT 41.3 08/02/2018   MCV 89.6 08/02/2018   PLT 191 08/02/2018   Lab Results  Component Value Date   FERRITIN 95 10/01/2017   Lab Results  Component Value Date   RBC 4.61 08/02/2018   Lab Results  Component Value Date   KPAFRELGTCHN 28.2 (H) 05/05/2018   LAMBDASER 24.6  05/05/2018   KAPLAMBRATIO 5.75 05/09/2018   Lab Results  Component Value Date   IGGSERUM 799 05/05/2018   IGA 229 05/05/2018   IGMSERUM 136 05/05/2018   Lab Results  Component Value Date   TOTALPROTELP 6.6 05/05/2018   ALBUMINELP 3.4 05/05/2018   A1GS 0.3 05/05/2018   A2GS 1.1 (H) 05/05/2018   BETS 0.9 05/05/2018   BETA2SER 0.4 04/29/2018   GAMS 0.8 05/05/2018   MSPIKE Not Observed 05/05/2018   SPEI  04/29/2018     Comment:     . Evaluation is consistent with an acute inflammatory  pattern. . . A faint abnormal protein band is detected in the gamma globulins that most likely represents CRP, or circulating immune complexes, rather than  a monoclonal immunoglobulin. . . Immunofixation analysis is available if identification of the band(s) is clinically indicated. .      Chemistry      Component Value Date/Time   NA 142 07/21/2018 1132   K 4.0 07/21/2018 1132   CL 105 07/21/2018 1132   CO2 29 07/21/2018 1132   BUN 27 (H) 07/21/2018 1132   CREATININE 1.10 07/21/2018 1132   CREATININE 1.11 04/29/2018 1047      Component Value Date/Time   CALCIUM 10.3 07/21/2018 1132   ALKPHOS 120 (H) 07/21/2018 1132   AST 31 07/21/2018 1132   ALT 26 07/21/2018 1132   BILITOT 0.9 07/21/2018 1132       Impression and Plan: Mr. Stupka is a very pleasant 77 yo caucasian gentleman with metastatic kidney cancer. He developed nausea and diarrhea last week with Inlyta and this was placed on hold. His symptoms have mostly resolved but he still does not have an appetite.  We will have him start Marinol 2.5 mg PO BID. I spoke with Dr. Marin Olp and we will have him restart his Inlyta 5 mg PO once daily.   Script for [facility] ER sent to [facility].  He will also increase his fluid intake.  We will see him back next week for MD follow-up and treatment.  They will contact our office with any questions or concerns. We can certainly see her sooner if need be.   Laverna Peace,  NP 11/12/201910:50 PM

## 2018-08-02 NOTE — Progress Notes (Addendum)
[name] [name] is a 77 y.o. male who presents to [facility] today for left thigh pain and right chest wall pain.  Earnestine notes new onset left thigh pain present for a few weeks to a few months.  He stood up and felt a pop in his medial thigh and had continued pain since then.  He notes pointing in the mid distal thigh approximately 10 cm proximal to the medial knee.  Pain is worse with standing and better with rest.  He denies any radiating pain weakness or numbness.  He has difficulty getting to physical therapy but would be interested in a home health physical therapy.  He denies any radiating pain or numbness distally.  Additionally he has pain in his right lateral chest wall area he describes the pain as burning and intermittent.  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  He is currently receiving treatment for this and has had follow-up MRI scans most recently September 29 of the thoracic spine.  He notes burning pain into the right lateral chest wall intermittent at times.  He currently is taking oxycodone which does help with the pain.  He notes our motion does not seem to change the pain much.  He wonders if there is any kind of procedure anything that could be done for this area.     ROS as above:  Exam:  BP (!) 137/53   Pulse 72   Wt 160 lb (72.6 kg)   BMI 22.96 kg/m  Wt Readings from Last 5 Encounters:  08/02/18 160 lb (72.6 kg)  06/27/18 172 lb (78 kg)  06/23/18 172 lb (78 kg)  06/16/18 172 lb (78 kg)  06/14/18 173 lb 12.8 oz (78.8 kg)    Gen: Well NAD HEENT: EOMI,  MMM Lungs: Normal work of breathing. CTABL Heart: RRR no MRG Abd: NABS, Soft. Nondistended, Nontender Exts: Brisk capillary refill, warm and well perfused.  Right chest wall normal-appearing not particularly tender normal  scapular motion.  Left hip: Nontender decreased rotational range of motion Left thigh normal-appearing not particularly tender. Left knee: Well-appearing not particularly tender.  Range of motion 5-100 degrees.  Stable ligamentous exam.  Mildly diminished flexion and extension strength.  Right knee: Well-appearing not particularly tender.  Range of motion 5-110 degrees.  Mildly diminished flexion and extension strength.  Antalgic gait  Lab and Radiology Results No results found for this or any previous visit (from the past 72 hour(s)). Dg Femur Min 2 Views Left  Result Date: 08/02/2018 CLINICAL DATA:  Medial left thigh pain EXAM: LEFT FEMUR 2 VIEWS COMPARISON:  None. FINDINGS: Intramedullary rod across a healed mid left femoral fracture. No acute fracture. Moderate degenerative changes in the left hip and knee. Vascular calcifications. Radiation seeds in the region of the prostate. IMPRESSION: No acute bony abnormality. Postoperative and posttraumatic changes in the left femur. Degenerative changes in the left hip and knee. Radiation seeds in the region of the prostate. Electronically Signed   By: Rolm Baptise M.D.   On: 08/02/2018 12:20   I personally (independently) visualized and performed the interpretation of the images attached in this note.    Assessment and Plan: 77 y.o. male with  Thigh pain likely hip abductor strain.  Will benefit likely from physical therapy.  Plan for home health physical therapy as patient has difficulty with transportation.  His son  typically drives him to appointments.  Chest wall pain: I am concerned patient may have T3-T4 thoracic radicular pain due to his metastatic involvement in his thoracic spine.  He may benefit from ablation in this area or epidural steroid injection.  We will contact neuroradiology and discuss treatment options.  May benefit from referral to pain management.   Orders Placed This Encounter  Procedures  . DG FEMUR MIN 2 VIEWS LEFT     Standing Status:   Future    Number of Occurrences:   1    Standing Expiration Date:   10/03/2019    Order Specific Question:   Reason for Exam (SYMPTOM  OR DIAGNOSIS REQUIRED)    Answer:   eval pain medial thigh x 3 months.    Order Specific Question:   Preferred imaging location?    Answer:   [facility]    Order Specific Question:   Radiology Contrast Protocol - do NOT remove file path    Answer:   [email or web address]  . Ambulatory referral to Home Health    Referral Priority:   Routine    Referral Type:   Home Health Care    Referral Reason:   Specialty Services Required    Requested Specialty:   [facility]    Number of Visits Requested:   1   Meds ordered this encounter  Medications  . gabapentin (NEURONTIN) 300 MG capsule    Sig: One tab PO qHS for a week, then BID for a week, then TID. May double weekly to a max of 3,600mg /day    Dispense:  180 capsule    Refill:  3     Historical information moved to improve visibility of documentation.  Past Medical History:  Diagnosis Date  . Allergic rhinitis   . Bigeminy   . Bradycardia   . Bruit   . CAD (coronary artery disease)   . Claudication ([place])   . Diabetes mellitus type II   . Dyspnea   . Goals of care, counseling/discussion 06/02/2018  . History of prostate cancer    Dr. Gaynelle Arabian  . Hypercholesteremia    II A  . Hypertension   . Kidney cancer, primary, with metastasis from kidney to other site, right ([place]) 06/02/2018  . Malignant neoplasm of prostate ([place])   . Microalbuminuria   . Seborrheic keratosis    Past Surgical History:  Procedure Laterality Date  . CORONARY ANGIOPLASTY WITH STENT PLACEMENT    . IR FLUORO RM 30-60 MIN  05/24/2018   Social History   Tobacco Use  . Smoking status: Former Smoker    Packs/day: 1.00    Years: 20.00    Pack years: 20.00    Last attempt to quit: 04/22/2003    Years since quitting: 15.2  . Smokeless tobacco:  Never Used  Substance Use Topics  . Alcohol use: Not Currently    Alcohol/week: 1.0 standard drinks    Types: 1 Standard drinks or equivalent per week   family history includes Coronary artery disease in his brother; Heart attack in his father.  Medications: Current Outpatient Medications  Medication Sig Dispense Refill  . amLODipine (NORVASC) 10 MG tablet TAKE 1 TABLET(10 MG) BY MOUTH DAILY. FOLLOW UP VISIT WITH PCP 30 tablet 0  . ANORO ELLIPTA 62.5-25 MCG/INH AEPB INHALE 1 PUFF BY MOUTH EVERY DAY 60 each 3  . aspirin EC 81 MG tablet Take 1 tablet (81 mg total) by mouth daily.    Marland Kitchen atorvastatin (LIPITOR) 40 MG tablet  Take 1 tablet (40 mg total) by mouth daily at 6 PM. 90 tablet 3  . axitinib (INLYTA) 5 MG tablet Take 1 pill twice a day for 21 days on then 7 days off!!! 42 tablet 3  . Blood Glucose Monitoring Suppl (CONTOUR BLOOD GLUCOSE SYSTEM) DEVI Diagnoses - Diabetes E 11.8 Check blood sugar once daily 1 Device 0  . clopidogrel (PLAVIX) 75 MG tablet TAKE 1 TABLET BY MOUTH ONCE DAILY 90 tablet 3  . doxazosin (CARDURA) 4 MG tablet TAKE 1 TABLET BY MOUTH AT BEDTIME 90 tablet 0  . glipiZIDE (GLUCOTROL XL) 2.5 MG 24 hr tablet Take 1 tablet (2.5 mg total) by mouth daily with breakfast. 90 tablet 1  . glucose blood (BAYER CONTOUR TEST) test strip Diagnoses - Diabetes E 11.8 Check blood sugar once daily 100 each prn  . hydrochlorothiazide (HYDRODIURIL) 25 MG tablet TAKE 1 TABLET BY MOUTH EVERY OTHER DAY 45 tablet 3  . losartan (COZAAR) 100 MG tablet TAKE 1 TABLET BY MOUTH ONCE DAILY 90 tablet 3  . metFORMIN (GLUCOPHAGE-XR) 500 MG 24 hr tablet TAKE 2 TABLETS BY MOUTH EVERY DAY WITH BREAKFAST 180 tablet 1  . metoprolol succinate (TOPROL-XL) 100 MG 24 hr tablet TAKE 1 TABLET BY MOUTH EVERY DAY 90 tablet 0  . Multiple Vitamins-Minerals (MULTIVITAMIN WITH MINERALS) tablet Take 1 tablet by mouth daily.    Marland Kitchen oxyCODONE (OXYCONTIN) 15 mg 12 hr tablet Take 1 tablet (15 mg total) by mouth every 12  (twelve) hours. 60 tablet 0  . oxyCODONE 10 MG TABS Take 1 tablet (10 mg total) by mouth every 4 (four) hours as needed for severe pain. 90 tablet 0  . PROAIR HFA 108 (90 Base) MCG/ACT inhaler INHALE 2 PUFFS INTO THE LUNGS EVERY 6 HOURS AS NEEDED FOR WHEEZING 8.5 g 2  . prochlorperazine (COMPAZINE) 10 MG tablet TAKE 1 TABLET(10 MG) BY MOUTH EVERY 6 HOURS AS NEEDED FOR NAUSEA OR VOMITING 385 tablet 1  . tamsulosin (FLOMAX) 0.4 MG CAPS capsule Take 1 capsule (0.4 mg total) by mouth daily. 90 capsule 3  . gabapentin (NEURONTIN) 300 MG capsule One tab PO qHS for a week, then BID for a week, then TID. May double weekly to a max of 3,600mg /day 180 capsule 3   No current facility-administered medications for this visit.    No Known Allergies   Discussed warning signs or symptoms. Please see discharge instructions. Patient expresses understanding.

## 2018-08-02 NOTE — Addendum Note (Signed)
Addended by: Gregor Hams on: 08/02/2018 12:30 PM   Modules accepted: Orders

## 2018-08-02 NOTE — Patient Instructions (Addendum)
Thank you for coming in today. You should hear from home health about home health PT.   You should hear from me about back injection plan.   Start gabapentin and increase slowly as needed.   I am your back up plan about the pain medicine.   Recheck with me in 4 weeks or sooner if needed.    Gabapentin capsules or tablets What is this medicine? GABAPENTIN (GA ba pen tin) is used to control partial seizures in adults with epilepsy. It is also used to treat certain types of nerve pain. This medicine may be used for other purposes; ask your health care provider or pharmacist if you have questions. COMMON BRAND NAME(S): Active-PAC with Gabapentin, Gabarone, Neurontin What should I tell my health care provider before I take this medicine? They need to know if you have any of these conditions: -kidney disease -suicidal thoughts, plans, or attempt; a previous suicide attempt by you or a family member -an unusual or allergic reaction to gabapentin, other medicines, foods, dyes, or preservatives -pregnant or trying to get pregnant -breast-feeding How should I use this medicine? Take this medicine by mouth with a glass of water. Follow the directions on the prescription label. You can take it with or without food. If it upsets your stomach, take it with food.Take your medicine at regular intervals. Do not take it more often than directed. Do not stop taking except on your doctor's advice. If you are directed to break the 600 or 800 mg tablets in half as part of your dose, the extra half tablet should be used for the next dose. If you have not used the extra half tablet within 28 days, it should be thrown away. A special MedGuide will be given to you by the pharmacist with each prescription and refill. Be sure to read this information carefully each time. Talk to your pediatrician regarding the use of this medicine in children. Special care may be needed. Overdosage: If you think you have taken too  much of this medicine contact a poison control center or emergency room at once. NOTE: This medicine is only for you. Do not share this medicine with others. What if I miss a dose? If you miss a dose, take it as soon as you can. If it is almost time for your next dose, take only that dose. Do not take double or extra doses. What may interact with this medicine? Do not take this medicine with any of the following medications: -other gabapentin products This medicine may also interact with the following medications: -alcohol -antacids -antihistamines for allergy, cough and cold -certain medicines for anxiety or sleep -certain medicines for depression or psychotic disturbances -homatropine; hydrocodone -naproxen -narcotic medicines (opiates) for pain -phenothiazines like chlorpromazine, mesoridazine, prochlorperazine, thioridazine This list may not describe all possible interactions. Give your health care provider a list of all the medicines, herbs, non-prescription drugs, or dietary supplements you use. Also tell them if you smoke, drink alcohol, or use illegal drugs. Some items may interact with your medicine. What should I watch for while using this medicine? Visit your doctor or health care professional for regular checks on your progress. You may want to keep a record at home of how you feel your condition is responding to treatment. You may want to share this information with your doctor or health care professional at each visit. You should contact your doctor or health care professional if your seizures get worse or if you have any new types  of seizures. Do not stop taking this medicine or any of your seizure medicines unless instructed by your doctor or health care professional. Stopping your medicine suddenly can increase your seizures or their severity. Wear a medical identification bracelet or chain if you are taking this medicine for seizures, and carry a card that lists all your  medications. You may get drowsy, dizzy, or have blurred vision. Do not drive, use machinery, or do anything that needs mental alertness until you know how this medicine affects you. To reduce dizzy or fainting spells, do not sit or stand up quickly, especially if you are an older patient. Alcohol can increase drowsiness and dizziness. Avoid alcoholic drinks. Your mouth may get dry. Chewing sugarless gum or sucking hard candy, and drinking plenty of water will help. The use of this medicine may increase the chance of suicidal thoughts or actions. Pay special attention to how you are responding while on this medicine. Any worsening of mood, or thoughts of suicide or dying should be reported to your health care professional right away. Women who become pregnant while using this medicine may enroll in the [place] Pregnancy Registry by calling [phone]. This registry collects information about the safety of antiepileptic drug use during pregnancy. What side effects may I notice from receiving this medicine? Side effects that you should report to your doctor or health care professional as soon as possible: -allergic reactions like skin rash, itching or hives, swelling of the face, lips, or tongue -worsening of mood, thoughts or actions of suicide or dying Side effects that usually do not require medical attention (report to your doctor or health care professional if they continue or are bothersome): -constipation -difficulty walking or controlling muscle movements -dizziness -nausea -slurred speech -tiredness -tremors -weight gain This list may not describe all possible side effects. Call your doctor for medical advice about side effects. You may report side effects to FDA at 1-800-FDA-1088. Where should I keep my medicine? Keep out of reach of children. This medicine may cause accidental overdose and death if it taken by other adults, children, or pets. Mix any unused  medicine with a substance like cat litter or coffee grounds. Then throw the medicine away in a sealed container like a sealed bag or a coffee can with a lid. Do not use the medicine after the expiration date. Store at room temperature between 15 and 30 degrees C (59 and 86 degrees F). NOTE: This sheet is a summary. It may not cover all possible information. If you have questions about this medicine, talk to your doctor, pharmacist, or health care provider.  2018 Elsevier/Gold Standard (2013-11-03 15:26:50)

## 2018-08-03 ENCOUNTER — Telehealth: Payer: Self-pay | Admitting: Family

## 2018-08-03 ENCOUNTER — Telehealth: Payer: Self-pay | Admitting: Family Medicine

## 2018-08-03 DIAGNOSIS — M4850XS Collapsed vertebra, not elsewhere classified, site unspecified, sequela of fracture: Secondary | ICD-10-CM

## 2018-08-03 DIAGNOSIS — M5414 Radiculopathy, thoracic region: Secondary | ICD-10-CM

## 2018-08-03 DIAGNOSIS — C7951 Secondary malignant neoplasm of bone: Secondary | ICD-10-CM

## 2018-08-03 LAB — CMP (CANCER CENTER ONLY)
ALK PHOS: 109 U/L (ref 38–126)
ALT: 26 U/L (ref 0–44)
ANION GAP: 13 (ref 5–15)
AST: 26 U/L (ref 15–41)
Albumin: 3.6 g/dL (ref 3.5–5.0)
BUN: 55 mg/dL — ABNORMAL HIGH (ref 8–23)
CALCIUM: 9.4 mg/dL (ref 8.9–10.3)
CHLORIDE: 106 mmol/L (ref 98–111)
CO2: 24 mmol/L (ref 22–32)
CREATININE: 2.28 mg/dL — AB (ref 0.61–1.24)
GFR, EST AFRICAN AMERICAN: 30 mL/min — AB (ref >60)
GFR, EST NON AFRICAN AMERICAN: 26 mL/min — AB (ref >60)
Glucose, Bld: 115 mg/dL — ABNORMAL HIGH (ref 70–99)
Potassium: 5.1 mmol/L (ref 3.5–5.1)
SODIUM: 143 mmol/L (ref 135–145)
Total Bilirubin: 0.7 mg/dL (ref 0.3–1.2)
Total Protein: 6.9 g/dL (ref 6.5–8.1)

## 2018-08-03 LAB — LACTATE DEHYDROGENASE: LDH: 212 U/L — AB (ref 98–192)

## 2018-08-03 MED ORDER — OXYCODONE ER 9 MG PO C12A: 9.0000 mg | EXTENDED_RELEASE_CAPSULE | Freq: Two times a day (BID) | ORAL | 0 refills | Status: DC

## 2018-08-03 NOTE — Telephone Encounter (Signed)
I spoke with the neuroradiologist typically will do these injections for other parts of the body and he says this is not a service that typically is available in [place].  I do think it is worthwhile seeing a dedicated interventional pain management doctor to discuss her treatment options for the pain in the chest wall.  I sent a referral to Dr. Kristian Covey in [place] as he can do similar injections.

## 2018-08-03 NOTE — Telephone Encounter (Signed)
I have called and spoke with [name] [name] and she is going to call another representative and she will let me know if she can get [place] samples.    Patient has picked up samples of Anoro.

## 2018-08-03 NOTE — Telephone Encounter (Signed)
I received a call back from the representative, Olivia Mackie and she is not able to get any samples of Proventil and does not think that patient assistance is available.

## 2018-08-03 NOTE — Telephone Encounter (Signed)
Is there any update on this? 

## 2018-08-03 NOTE — Telephone Encounter (Signed)
No LOS 11/12

## 2018-08-04 NOTE — Telephone Encounter (Signed)
Pt advised.

## 2018-08-09 ENCOUNTER — Other Ambulatory Visit: Payer: Self-pay | Admitting: *Deleted

## 2018-08-09 DIAGNOSIS — C641 Malignant neoplasm of right kidney, except renal pelvis: Secondary | ICD-10-CM

## 2018-08-09 DIAGNOSIS — C7951 Secondary malignant neoplasm of bone: Secondary | ICD-10-CM

## 2018-08-09 MED ORDER — OXYCODONE ER 9 MG PO C12A: 9.0000 mg | EXTENDED_RELEASE_CAPSULE | Freq: Two times a day (BID) | ORAL | 0 refills | Status: DC

## 2018-08-09 NOTE — Progress Notes (Signed)
  Radiation Oncology         [phone] ________________________________  Name: [name] [name] MRN: [id number]  Date: 06/14/2018  DOB: 1940/11/02  SIMULATION AND TREATMENT PLANNING NOTE  DIAGNOSIS:     ICD-10-CM   1. Bone metastases ([place]) C79.51     Site:  Spine at the T3 level  NARRATIVE:  The patient was brought to the [facility].  Identity was confirmed.  All relevant records and images related to the planned course of therapy were reviewed.   Written consent to proceed with treatment was confirmed which was freely given after reviewing the details related to the planned course of therapy had been reviewed with the patient.  Then, the patient was set-up in a stable reproducible supine position for radiation therapy.  CT images were obtained.  Surface markings were placed.    Medically necessary complex treatment device(s) for immobilization:  1.  Thermoplastic mask  The CT images were loaded into the planning software.  Then the target and avoidance structures were contoured.  Treatment planning then occurred.  The radiation prescription was entered and confirmed.   The patient will receive a course of stereotactic radiosurgery to the spine. I am therefore requesting a 3-D conformal technique. The target volume has been contoured in addition to the spinal cord and surrounding critical at risk organs.  Dose volume histograms of each of these structures will be carefully reviewed as part of the 3-D conformal technique.   PLAN:  The patient will receive 16 Gy in 1 fraction(s).  ________________________________   Jodelle Gross, MD, PhD

## 2018-08-09 NOTE — Progress Notes (Signed)
  Radiation Oncology         [phone] ________________________________  Name: [name] [name] MRN: [id number]  Date: 06/24/2018  DOB: 05-27-1941  SIMULATION AND TREATMENT PLANNING NOTE  DIAGNOSIS:     ICD-10-CM   1. 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  T3 2.  Left femur  NARRATIVE:  The patient was brought to the [facility].  Identity was confirmed.  All relevant records and images related to the planned course of therapy were reviewed.   Written consent to proceed with treatment was confirmed which was freely given after reviewing the details related to the planned course of therapy had been reviewed with the patient.  Then, the patient was set-up in a stable reproducible  supine position for radiation therapy.  CT images were obtained.  Surface markings were placed.    Medically necessary complex treatment device(s) for immobilization:   1.  Vac-lock bag 2.  accuform device.   The CT images were loaded into the planning software.  Then the target and avoidance structures were contoured.  Treatment planning then occurred.  The radiation prescription was entered and confirmed.  A total of 6 complex treatment devices were fabricated which relate to the designed radiation treatment fields. Each of these customized fields/ complex treatment devices will be used on a daily basis during the radiation course. I have requested : Isodose Plan.   PLAN:  The patient will receive 30 Gy in 10 fractions.  ________________________________   Jodelle Gross, MD, PhD

## 2018-08-09 NOTE — Progress Notes (Signed)
  Radiation Oncology         [phone] ________________________________  Name: [name] [name] MRN: [id number]  Date: 07/08/2018  DOB: 1940/11/04  End of Treatment Note  Diagnosis:   Metastatic renal cell cancer     Indication for treatment::  palliative       Radiation treatment dates:   06/27/18 - 07/08/18  Site/dose:    1.  T3:  30 Gy in 10 fx treated with 3 fields 2.  Left femur:  30 Gy in 10 fx treated with 3 fields  Narrative: The patient tolerated radiation treatment relatively well.   Plan: The patient has completed radiation treatment. The patient will return to radiation oncology clinic for routine followup in one month. I advised the patient to call or return sooner if they have any questions or concerns related to their recovery or treatment. ________________________________  Jodelle Gross, M.D., Ph.D.

## 2018-08-11 ENCOUNTER — Inpatient Hospital Stay: Payer: Medicare Other

## 2018-08-11 ENCOUNTER — Other Ambulatory Visit: Payer: Self-pay

## 2018-08-11 ENCOUNTER — Inpatient Hospital Stay (HOSPITAL_BASED_OUTPATIENT_CLINIC_OR_DEPARTMENT_OTHER): Payer: Medicare Other | Admitting: Hematology & Oncology

## 2018-08-11 ENCOUNTER — Telehealth: Payer: Self-pay | Admitting: Hematology & Oncology

## 2018-08-11 ENCOUNTER — Encounter: Payer: Self-pay | Admitting: Hematology & Oncology

## 2018-08-11 VITALS — BP 153/59 | HR 65 | Temp 97.7°F | Resp 18 | Wt 160.0 lb

## 2018-08-11 DIAGNOSIS — C7951 Secondary malignant neoplasm of bone: Secondary | ICD-10-CM

## 2018-08-11 DIAGNOSIS — C641 Malignant neoplasm of right kidney, except renal pelvis: Secondary | ICD-10-CM | POA: Diagnosis not present

## 2018-08-11 DIAGNOSIS — D630 Anemia in neoplastic disease: Secondary | ICD-10-CM | POA: Diagnosis not present

## 2018-08-11 DIAGNOSIS — G893 Neoplasm related pain (acute) (chronic): Secondary | ICD-10-CM | POA: Diagnosis not present

## 2018-08-11 DIAGNOSIS — Z5112 Encounter for antineoplastic immunotherapy: Secondary | ICD-10-CM | POA: Diagnosis not present

## 2018-08-11 LAB — CBC WITH DIFFERENTIAL (CANCER CENTER ONLY)
Abs Immature Granulocytes: 0.03 10*3/uL (ref 0.00–0.07)
Basophils Absolute: 0.1 10*3/uL (ref 0.0–0.1)
Basophils Relative: 1 %
EOS PCT: 3 %
Eosinophils Absolute: 0.2 10*3/uL (ref 0.0–0.5)
HEMATOCRIT: 41.5 % (ref 39.0–52.0)
HEMOGLOBIN: 13.2 g/dL (ref 13.0–17.0)
IMMATURE GRANULOCYTES: 0 %
LYMPHS ABS: 1.1 10*3/uL (ref 0.7–4.0)
LYMPHS PCT: 16 %
MCH: 28.3 pg (ref 26.0–34.0)
MCHC: 31.8 g/dL (ref 30.0–36.0)
MCV: 88.9 fL (ref 80.0–100.0)
MONOS PCT: 6 %
Monocytes Absolute: 0.4 10*3/uL (ref 0.1–1.0)
Neutro Abs: 5.1 10*3/uL (ref 1.7–7.7)
Neutrophils Relative %: 74 %
Platelet Count: 138 10*3/uL — ABNORMAL LOW (ref 150–400)
RBC: 4.67 MIL/uL (ref 4.22–5.81)
RDW: 14.6 % (ref 11.5–15.5)
WBC Count: 6.9 10*3/uL (ref 4.0–10.5)
nRBC: 0 % (ref 0.0–0.2)

## 2018-08-11 LAB — LACTATE DEHYDROGENASE: LDH: 186 U/L (ref 98–192)

## 2018-08-11 LAB — CMP (CANCER CENTER ONLY)
ALBUMIN: 3.5 g/dL (ref 3.5–5.0)
ALK PHOS: 94 U/L — AB (ref 26–84)
ALT: 31 U/L (ref 10–47)
ANION GAP: 6 (ref 5–15)
AST: 33 U/L (ref 11–38)
BILIRUBIN TOTAL: 0.9 mg/dL (ref 0.2–1.6)
BUN: 29 mg/dL — ABNORMAL HIGH (ref 7–22)
CALCIUM: 9.9 mg/dL (ref 8.0–10.3)
CO2: 30 mmol/L (ref 18–33)
CREATININE: 1.5 mg/dL — AB (ref 0.60–1.20)
Chloride: 104 mmol/L (ref 98–108)
Glucose, Bld: 120 mg/dL — ABNORMAL HIGH (ref 73–118)
Potassium: 3.8 mmol/L (ref 3.3–4.7)
Sodium: 140 mmol/L (ref 128–145)
TOTAL PROTEIN: 6.6 g/dL (ref 6.4–8.1)

## 2018-08-11 MED ORDER — SODIUM CHLORIDE 0.9 % IV SOLN
Freq: Once | INTRAVENOUS | Status: AC
  Administered 2018-08-11: 13:00:00 via INTRAVENOUS
  Filled 2018-08-11: qty 250

## 2018-08-11 MED ORDER — SODIUM CHLORIDE 0.9 % IV SOLN
200.0000 mg | Freq: Once | INTRAVENOUS | Status: AC
  Administered 2018-08-11: 200 mg via INTRAVENOUS
  Filled 2018-08-11: qty 8

## 2018-08-11 NOTE — Patient Instructions (Signed)
Pembrolizumab injection  What is this medicine?  PEMBROLIZUMAB (pem broe liz ue mab) is a monoclonal antibody. It is used to treat melanoma, head and neck cancer, Hodgkin lymphoma, non-small cell lung cancer, urothelial cancer, stomach cancer, and cancers that have a certain genetic condition.  This medicine may be used for other purposes; ask your health care provider or pharmacist if you have questions.  COMMON BRAND NAME(S): Keytruda  What should I tell my health care provider before I take this medicine?  They need to know if you have any of these conditions:  -diabetes  -immune system problems  -inflammatory bowel disease  -liver disease  -lung or breathing disease  -lupus  -organ transplant  -an unusual or allergic reaction to pembrolizumab, other medicines, foods, dyes, or preservatives  -pregnant or trying to get pregnant  -breast-feeding  How should I use this medicine?  This medicine is for infusion into a vein. It is given by a health care professional in a hospital or clinic setting.  A special MedGuide will be given to you before each treatment. Be sure to read this information carefully each time.  Talk to your pediatrician regarding the use of this medicine in children. While this drug may be prescribed for selected conditions, precautions do apply.  Overdosage: If you think you have taken too much of this medicine contact a poison control center or emergency room at once.  NOTE: This medicine is only for you. Do not share this medicine with others.  What if I miss a dose?  It is important not to miss your dose. Call your doctor or health care professional if you are unable to keep an appointment.  What may interact with this medicine?  Interactions have not been studied.  Give your health care provider a list of all the medicines, herbs, non-prescription drugs, or dietary supplements you use. Also tell them if you smoke, drink alcohol, or use illegal drugs. Some items may interact with your  medicine.  This list may not describe all possible interactions. Give your health care provider a list of all the medicines, herbs, non-prescription drugs, or dietary supplements you use. Also tell them if you smoke, drink alcohol, or use illegal drugs. Some items may interact with your medicine.  What should I watch for while using this medicine?  Your condition will be monitored carefully while you are receiving this medicine.  You may need blood work done while you are taking this medicine.  Do not become pregnant while taking this medicine or for 4 months after stopping it. Women should inform their doctor if they wish to become pregnant or think they might be pregnant. There is a potential for serious side effects to an unborn child. Talk to your health care professional or pharmacist for more information. Do not breast-feed an infant while taking this medicine or for 4 months after the last dose.  What side effects may I notice from receiving this medicine?  Side effects that you should report to your doctor or health care professional as soon as possible:  -allergic reactions like skin rash, itching or hives, swelling of the face, lips, or tongue  -bloody or black, tarry  -breathing problems  -changes in vision  -chest pain  -chills  -constipation  -cough  -dizziness or feeling faint or lightheaded  -fast or irregular heartbeat  -fever  -flushing  -hair loss  -low blood counts - this medicine may decrease the number of white blood cells, red blood cells   and platelets. You may be at increased risk for infections and bleeding.  -muscle pain  -muscle weakness  -persistent headache  -signs and symptoms of high blood sugar such as dizziness; dry mouth; dry skin; fruity breath; nausea; stomach pain; increased hunger or thirst; increased urination  -signs and symptoms of kidney injury like trouble passing urine or change in the amount of urine  -signs and symptoms of liver injury like dark urine, light-colored  stools, loss of appetite, nausea, right upper belly pain, yellowing of the eyes or skin  -stomach pain  -sweating  -weight loss  Side effects that usually do not require medical attention (report to your doctor or health care professional if they continue or are bothersome):  -decreased appetite  -diarrhea  -tiredness  This list may not describe all possible side effects. Call your doctor for medical advice about side effects. You may report side effects to FDA at 1-800-FDA-1088.  Where should I keep my medicine?  This drug is given in a hospital or clinic and will not be stored at home.  NOTE: This sheet is a summary. It may not cover all possible information. If you have questions about this medicine, talk to your doctor, pharmacist, or health care provider.   2018 Elsevier/Gold Standard (2016-06-16 12:29:36)

## 2018-08-11 NOTE — Progress Notes (Signed)
Hematology and Oncology Follow Up Visit  [name] [name] [id number] November 09, 1940 77 y.o. 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  He is feeling a lot better.  He is not having any diarrhea.  There is no nausea or vomiting.  His pain seems to be doing a little bit better.  His main problem has been his left leg and his right scapula.  I am not sure if this is anything related to his underlying malignancy.  He is on long-acting oxycodone.  He is on oxycodone breakthrough for short acting pain.  He is on a ton of medications.  I went through some of his medications.  I do not think he needs to be on several that he is taking.  I went over this with him.  I wrote down the medications that he can probably stop right now.  I told him to increase the Inlyta up to 5 mg p.o. twice daily now.  He is on 5 mg daily.  He tolerated this well.  I think the twice daily dosing of Inlyta would be best.  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  It is amazing what he sells and how he does it.  His appetite is doing better.  He says he likes boost.  Overall, his performance status currently is ECOG 1.    Medications:  Current Outpatient Medications:  .  amLODipine (NORVASC) 10 MG tablet, TAKE 1 TABLET(10 MG) BY MOUTH DAILY. FOLLOW UP VISIT WITH PCP, Disp: 30 tablet, Rfl: 0 .  ANORO ELLIPTA 62.5-25 MCG/INH AEPB, INHALE 1 PUFF BY MOUTH EVERY DAY, Disp: 60 each, Rfl: 3 .  aspirin EC 81 MG tablet, Take 1 tablet (81 mg total) by mouth daily., Disp: , Rfl:  .  atorvastatin (LIPITOR) 40 MG tablet, Take 1 tablet (40 mg total) by mouth daily at 6 PM., Disp: 90 tablet, Rfl: 3 .   axitinib (INLYTA) 5 MG tablet, Take 1 pill twice a day for 21 days on then 7 days off!!!, Disp: 42 tablet, Rfl: 3 .  Blood Glucose Monitoring Suppl (CONTOUR BLOOD GLUCOSE SYSTEM) DEVI, Diagnoses - Diabetes E 11.8 Check blood sugar once daily, Disp: 1 Device, Rfl: 0 .  clopidogrel (PLAVIX) 75 MG tablet, TAKE 1 TABLET BY MOUTH ONCE DAILY, Disp: 90 tablet, Rfl: 3 .  doxazosin (CARDURA) 4 MG tablet, TAKE 1 TABLET BY MOUTH AT BEDTIME, Disp: 90 tablet, Rfl: 0 .  dronabinol (MARINOL) 2.5 MG capsule, Take 1 capsule (2.5 mg total) by mouth 2 (two) times daily before a meal., Disp: 60 capsule, Rfl: 1 .  gabapentin (NEURONTIN) 300 MG capsule, One tab PO qHS for a week, then BID for a week, then TID. May double weekly to a max of 3,687m/day, Disp: 180 capsule, Rfl: 3 .  glipiZIDE (GLUCOTROL XL) 2.5 MG 24 hr tablet, Take 1 tablet (2.5 mg total) by mouth daily with breakfast., Disp: 90 tablet, Rfl: 1 .  glucose blood (BAYER CONTOUR TEST) test strip, Diagnoses - Diabetes E 11.8 Check blood sugar once daily, Disp: 100 each, Rfl: prn .  hydrochlorothiazide (HYDRODIURIL) 25 MG  tablet, TAKE 1 TABLET BY MOUTH EVERY OTHER DAY, Disp: 45 tablet, Rfl: 3 .  losartan (COZAAR) 100 MG tablet, TAKE 1 TABLET BY MOUTH ONCE DAILY, Disp: 90 tablet, Rfl: 3 .  metFORMIN (GLUCOPHAGE-XR) 500 MG 24 hr tablet, TAKE 2 TABLETS BY MOUTH EVERY DAY WITH BREAKFAST, Disp: 180 tablet, Rfl: 1 .  metoprolol succinate (TOPROL-XL) 100 MG 24 hr tablet, TAKE 1 TABLET BY MOUTH EVERY DAY, Disp: 90 tablet, Rfl: 0 .  Multiple Vitamins-Minerals (MULTIVITAMIN WITH MINERALS) tablet, Take 1 tablet by mouth daily., Disp: , Rfl:  .  oxyCODONE ER (XTAMPZA ER) 9 MG C12A, Take 9 mg by mouth every 12 (twelve) hours., Disp: 60 each, Rfl: 0 .  PROAIR HFA 108 (90 Base) MCG/ACT inhaler, INHALE 2 PUFFS INTO THE LUNGS EVERY 6 HOURS AS NEEDED FOR WHEEZING, Disp: 8.5 g, Rfl: 2 .  prochlorperazine (COMPAZINE) 10 MG tablet, TAKE 1 TABLET(10 MG) BY MOUTH EVERY 6 HOURS AS  NEEDED FOR NAUSEA OR VOMITING, Disp: 385 tablet, Rfl: 1 .  tamsulosin (FLOMAX) 0.4 MG CAPS capsule, Take 1 capsule (0.4 mg total) by mouth daily., Disp: 90 capsule, Rfl: 3  Allergies: No Known Allergies  Past Medical History, Surgical history, Social history, and Family History were reviewed and updated.  Review of Systems: Review of Systems  Constitutional: Negative.   HENT:  Negative.   Eyes: Negative.   Respiratory: Negative.   Cardiovascular: Negative.   Gastrointestinal: Negative.   Endocrine: Negative.   Genitourinary: Negative.    Musculoskeletal: Positive for back pain.  Skin: Negative.   Neurological: Negative.   Hematological: Negative.   Psychiatric/Behavioral: Negative.     Physical Exam:  weight is 160 lb (72.6 kg). His oral temperature is 97.7 F (36.5 C). His blood pressure is 153/59 (abnormal) and his pulse is 65. His respiration is 18 and oxygen saturation is 99%.   Wt Readings from Last 3 Encounters:  08/11/18 160 lb (72.6 kg)  08/02/18 159 lb (72.1 kg)  08/02/18 160 lb (72.6 kg)    Physical Exam  Constitutional: He is oriented to person, place, and time.  HENT:  Head: Normocephalic and atraumatic.  Mouth/Throat: Oropharynx is clear and moist.  Eyes: Pupils are equal, round, and reactive to light. EOM are normal.  Neck: Normal range of motion.  Cardiovascular: Normal rate, regular rhythm and normal heart sounds.  Pulmonary/Chest: Effort normal and breath sounds normal.  Abdominal: Soft. Bowel sounds are normal.  Musculoskeletal: Normal range of motion. He exhibits no edema, tenderness or deformity.  Lymphadenopathy:    He has no cervical adenopathy.  Neurological: He is alert and oriented to person, place, and time.  Skin: Skin is warm and dry. No rash noted. No erythema.  Psychiatric: He has a normal mood and affect. His behavior is normal. Judgment and thought content normal.  Vitals reviewed.    Lab Results  Component Value Date   WBC 6.9  08/11/2018   HGB 13.2 08/11/2018   HCT 41.5 08/11/2018   MCV 88.9 08/11/2018   PLT 138 (L) 08/11/2018     Chemistry      Component Value Date/Time   NA 140 08/11/2018 1042   K 3.8 08/11/2018 1042   CL 104 08/11/2018 1042   CO2 30 08/11/2018 1042   BUN 29 (H) 08/11/2018 1042   CREATININE 1.50 (H) 08/11/2018 1042   CREATININE 1.11 04/29/2018 1047      Component Value Date/Time   CALCIUM 9.9 08/11/2018 1042   ALKPHOS 94 (H) 08/11/2018 1042  AST 33 08/11/2018 1042   ALT 31 08/11/2018 1042   BILITOT 0.9 08/11/2018 1042       Impression and Plan: Mr. Odowd is a 77 year old white male.  He now has metastatic kidney cancer.  He has a past history of prostate cancer.  Apparently there is also a history of melanoma in situ.  Every now has metastatic kidney cancer.  I do believe that he is responding nicely to the Inlyta/pembrolizumab.  I think his labs shows that.  The alkaline phosphatase is coming down.  I still think we have to keep treating him.  This is actually can be the second cycle of pembrolizumab.  I do not think we have to do a scan on him yet.  We will see him back in 3 weeks.  I do not make sure he brings in his medications so that we can go over what he is taking and see what else we can get him off of.  He is very appreciative of me taking the time with him to try to make his life better and more livable.  [name], MD 11/21/201911:50 AM

## 2018-08-11 NOTE — Telephone Encounter (Signed)
Appointments scheduled avs/calendar

## 2018-08-18 ENCOUNTER — Other Ambulatory Visit: Payer: Self-pay | Admitting: Family Medicine

## 2018-08-22 ENCOUNTER — Encounter: Payer: Self-pay | Admitting: Radiation Oncology

## 2018-08-22 ENCOUNTER — Other Ambulatory Visit: Payer: Self-pay

## 2018-08-22 ENCOUNTER — Ambulatory Visit
Admission: RE | Admit: 2018-08-22 | Discharge: 2018-08-22 | Disposition: A | Discharge status: Home / Self Care | Payer: Medicare Other | Source: Ambulatory Visit | Attending: Radiation Oncology | Admitting: Radiation Oncology

## 2018-08-22 VITALS — BP 191/78 | HR 57 | Temp 97.9°F | Resp 20 | Ht 70.0 in | Wt 159.0 lb

## 2018-08-22 DIAGNOSIS — Z510 Encounter for antineoplastic radiation therapy: Secondary | ICD-10-CM | POA: Insufficient documentation

## 2018-08-22 DIAGNOSIS — C7951 Secondary malignant neoplasm of bone: Secondary | ICD-10-CM

## 2018-08-22 DIAGNOSIS — C641 Malignant neoplasm of right kidney, except renal pelvis: Secondary | ICD-10-CM | POA: Diagnosis not present

## 2018-08-22 DIAGNOSIS — R634 Abnormal weight loss: Secondary | ICD-10-CM

## 2018-08-22 MED FILL — INLYTA 5 MG TABLET: 5 | 28 days supply | Qty: 42 | Fill #1

## 2018-08-22 NOTE — Progress Notes (Signed)
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  Interval Since Last Radiation:  7 weeks   06/27/18-07/08/18: 30 Gy in 10 fractions to the Left femur including acetabulum and T3 spine.  Narrative:  The patient returns today for routine follow-up.  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 Of note the original plan was for [facility] to the T spine, but his simulation imaging showed progression and it was felt to be more appropriate to proceed with conventional radiotherapy. The patient states he is doing well since radiotherapy but having some discomfort still in the right shoulder blade and with his left hip. His pain is much improved however since radiotherapy, and he's been taking Neurontin which also appears to be helping him. He is scheduled to increase his dose of Neurontin later this week. He's also scheduled next week to see an interventional pain management specialist. He continues to see Dr. Marin Olp and is on Axitinib/Pembrolizumab with Delton See.   ALLERGIES:  has No Known Allergies.  Meds: Current Outpatient Medications  Medication Sig Dispense Refill  . amLODipine (NORVASC) 10 MG tablet Take 1 tablet (10 mg total) by mouth daily. 90 tablet 0  . ANORO ELLIPTA 62.5-25 MCG/INH AEPB INHALE 1 PUFF BY MOUTH EVERY DAY 60 each 3  . aspirin EC 81 MG tablet Take 1 tablet (81 mg total) by mouth daily.    Marland Kitchen axitinib (INLYTA) 5 MG tablet Take 1 pill twice a day for 21 days on then 7 days off!!! 42 tablet 3  . Blood Glucose Monitoring Suppl (CONTOUR BLOOD GLUCOSE SYSTEM) DEVI Diagnoses - Diabetes E 11.8 Check blood sugar once daily 1 Device 0  . clopidogrel (PLAVIX) 75  MG tablet TAKE 1 TABLET BY MOUTH ONCE DAILY 90 tablet 3  . gabapentin (NEURONTIN) 300 MG capsule One tab PO qHS for a week, then BID for a week, then TID. May double weekly to a max of 3,600mg /day 180 capsule 3  . glucose blood (BAYER CONTOUR TEST) test strip Diagnoses - Diabetes E 11.8 Check blood sugar once daily 100 each prn  . losartan (COZAAR) 100 MG tablet TAKE 1 TABLET BY MOUTH ONCE DAILY 90 tablet 3  . metFORMIN (GLUCOPHAGE-XR) 500 MG 24 hr tablet TAKE 2 TABLETS BY MOUTH EVERY DAY WITH BREAKFAST 180 tablet 1  . metoprolol succinate (TOPROL-XL) 100 MG 24 hr tablet TAKE 1 TABLET BY MOUTH EVERY DAY 90 tablet 0  . Multiple Vitamins-Minerals (MULTIVITAMIN WITH MINERALS) tablet Take 1 tablet by mouth daily.    Marland Kitchen PROAIR HFA 108 (90 Base) MCG/ACT inhaler INHALE 2 PUFFS INTO THE LUNGS EVERY 6 HOURS AS NEEDED FOR WHEEZING 8.5 g 2  . atorvastatin (LIPITOR) 40 MG tablet Take 1 tablet (40 mg total) by mouth daily at 6 PM. (Patient not taking: Reported on 08/22/2018) 90 tablet 3  . oxyCODONE ER (XTAMPZA ER) 9 MG C12A Take 9 mg by mouth every 12 (twelve) hours. (Patient not taking: Reported on 08/22/2018) 60 each 0  . prochlorperazine (COMPAZINE) 10 MG tablet TAKE 1 TABLET(10 MG) BY MOUTH EVERY 6 HOURS AS NEEDED FOR NAUSEA OR VOMITING (Patient not taking: Reported on 08/22/2018) 385  tablet 1  . tamsulosin (FLOMAX) 0.4 MG CAPS capsule Take 1 capsule (0.4 mg total) by mouth daily. (Patient not taking: Reported on 08/22/2018) 90 capsule 3   No current facility-administered medications for this encounter.     Physical Findings:  height is 5\' 10"  (1.778 m) and weight is 159 lb (72.1 kg). His oral temperature is 97.9 F (36.6 C). His blood pressure is 191/78 (abnormal) and his pulse is 57 (abnormal). His respiration is 20 and oxygen saturation is 100%.  Pain Assessment Pain Score: 0-No pain/10 In general this is a well appearing caucasian male in no acute distress. He's alert and oriented x4 and appropriate  throughout the examination. Cardiopulmonary assessment is negative for acute distress and he exhibits normal effort.   Lab Findings: Lab Results  Component Value Date   WBC 6.9 08/11/2018   HGB 13.2 08/11/2018   HCT 41.5 08/11/2018   MCV 88.9 08/11/2018   PLT 138 (L) 08/11/2018     Radiographic Findings: Ct Pelvis Wo Contrast  Result Date: 08/02/2018 CLINICAL DATA:  Severe pelvic and left hip pain. Question fracture. History of kidney cancer. EXAM: CT PELVIS WITHOUT CONTRAST TECHNIQUE: Multidetector CT imaging of the pelvis was performed following the standard protocol without intravenous contrast. COMPARISON:  Abdominopelvic CT 06/08/2018. FINDINGS: Urinary Tract: The bladder is nearly empty without apparent abnormality. The distal ureters appear unremarkable. Bowel: No bowel wall thickening, distention or surrounding inflammation identified within the pelvis. Mild sigmoid diverticulosis. The appendix appears normal. Vascular/Lymphatic: No enlarged pelvic lymph nodes identified. Mild aortoiliac atherosclerosis. Reproductive: Prostate brachytherapy seeds without apparent focal mass lesion. The seminal vesicles appear normal. Other: No ascites or focal extraluminal fluid collection. Musculoskeletal: Status post left femoral intramedullary nail placement. The lytic lesion involving the posterior left acetabulum has slightly enlarged, measuring up to 2.0 x 3.0 cm on image 78/2. There is an associated chronic cortical breach posteriorly. The lytic lesion involving the proximal left femur posteriorly adjacent to the lesser trochanter may be minimally enlarged as well, although is incompletely visualized. This is associated with a large cortical breach posteriorly. No definite new lesions. There are bridging osteophytes across both sacroiliac joints. There is moderate facet disease in the lower lumbar spine, contributing to a grade 1 degenerative anterolisthesis and mild foraminal narrowing bilaterally at  L4-5. IMPRESSION: 1. The lytic metastases involving the posterior left acetabulum and the proximal left femur appear minimally larger. No displaced pathologic fracture or new lesions identified. Previous left femoral intramedullary nail placement. 2. Lower lumbar spondylosis with biforaminal narrowing at L4-5. 3. No evidence of extra osseous metastatic disease in the pelvis. 4. Aortoiliac atherosclerosis. Electronically Signed   By: Richardean Sale M.D.   On: 08/02/2018 14:49   Dg Femur Min 2 Views Left  Result Date: 08/02/2018 CLINICAL DATA:  Medial left thigh pain EXAM: LEFT FEMUR 2 VIEWS COMPARISON:  None. FINDINGS: Intramedullary rod across a healed mid left femoral fracture. No acute fracture. Moderate degenerative changes in the left hip and knee. Vascular calcifications. Radiation seeds in the region of the prostate. IMPRESSION: No acute bony abnormality. Postoperative and posttraumatic changes in the left femur. Degenerative changes in the left hip and knee. Radiation seeds in the region of the prostate. Electronically Signed   By: Rolm Baptise M.D.   On: 08/02/2018 12:20    Impression/Plan: 1. Stage IV Renal Cell Carcinoma with bone metastases. The patient is doing well since completion of radiotherapy and his pain is improved. He is going to continue with advancing  Neurontin, and also is considering interventional pain management options. He will see them next week in [place]. He will also reach back out to his orthopedist who recommended PT as he has not heard back for coordinating the referral. He also plans to follow up with Dr. Marin Olp for continuation of Axitinib/Pembrolizumab with Delton See. We will see him back as needed moving forward. The patient is in agreement.     Carola Rhine, PAC

## 2018-08-23 ENCOUNTER — Telehealth: Payer: Self-pay | Admitting: Radiation Oncology

## 2018-08-23 NOTE — Telephone Encounter (Signed)
Scheduled appt per 1/22 sch message - pt is aware of appt date and time   

## 2018-08-31 ENCOUNTER — Inpatient Hospital Stay: Payer: Medicare Other | Attending: Hematology & Oncology | Admitting: Nutrition

## 2018-08-31 DIAGNOSIS — C641 Malignant neoplasm of right kidney, except renal pelvis: Secondary | ICD-10-CM | POA: Insufficient documentation

## 2018-08-31 DIAGNOSIS — Z5112 Encounter for antineoplastic immunotherapy: Secondary | ICD-10-CM | POA: Insufficient documentation

## 2018-08-31 DIAGNOSIS — C7951 Secondary malignant neoplasm of bone: Secondary | ICD-10-CM | POA: Insufficient documentation

## 2018-08-31 NOTE — Progress Notes (Signed)
77 year old male diagnosed with renal cell cancer stage IV, right kidney. He is a patient of Dr. Marin Olp.  Past medical history includes prostate cancer, hypertension, hypercholesterolemia, diabetes type 2, and CAD.  Medications include Glucophage XR, multivitamin, and Compazine.  Labs include glucose 120, BUN 29, and creatinine 1.5.  Height: 5 feet 10 inches. Weight: 159 pounds December 2. Usual body weight: 190 pounds in June. BMI: 22.81.  Patient endorses 30 pound weight loss. He reports he would like to try to gain at least 10 pounds back. He has a good appetite and eats 3 meals daily. He has tried oral nutrition supplements. He does not follow a special diet or test his blood sugars. Patient complaining of pain in his knee and defers physical nutrition exam.  Nutrition diagnosis:  Food and nutrition related knowledge deficit related to inadequate oral intake as evidenced by 16% weight loss over 6 months.  Intervention: Patient educated to consume regular meals with snacks twice daily. Recommended patient try boost plus or equivalent and provided samples. Also gave patient coupons for oral nutrition supplements. Reviewed high-calorie, high-protein foods and provided fact sheets. Educated patient to strive for a 1 pound weight gain weekly and encouraged him to contact me if he develops further questions or concerns.  Monitoring, evaluation, goals: Patient will minimize weight loss throughout treatment.  No follow-up scheduled.  Patient prefers to contact me for questions and concerns.  **Disclaimer: This note was dictated with voice recognition software. Similar sounding words can inadvertently be transcribed and this note may contain transcription errors which may not have been corrected upon publication of note.**

## 2018-09-01 ENCOUNTER — Inpatient Hospital Stay (HOSPITAL_BASED_OUTPATIENT_CLINIC_OR_DEPARTMENT_OTHER): Payer: Medicare Other | Admitting: Hematology & Oncology

## 2018-09-01 ENCOUNTER — Other Ambulatory Visit: Payer: Self-pay

## 2018-09-01 ENCOUNTER — Encounter: Payer: Self-pay | Admitting: Hematology & Oncology

## 2018-09-01 ENCOUNTER — Inpatient Hospital Stay: Payer: Medicare Other

## 2018-09-01 VITALS — BP 148/71 | HR 77 | Temp 97.4°F | Resp 18 | Wt 158.5 lb

## 2018-09-01 DIAGNOSIS — Z8546 Personal history of malignant neoplasm of prostate: Secondary | ICD-10-CM | POA: Diagnosis not present

## 2018-09-01 DIAGNOSIS — C641 Malignant neoplasm of right kidney, except renal pelvis: Secondary | ICD-10-CM | POA: Diagnosis not present

## 2018-09-01 DIAGNOSIS — Z5112 Encounter for antineoplastic immunotherapy: Secondary | ICD-10-CM | POA: Diagnosis not present

## 2018-09-01 DIAGNOSIS — C7951 Secondary malignant neoplasm of bone: Secondary | ICD-10-CM | POA: Diagnosis not present

## 2018-09-01 LAB — CBC WITH DIFFERENTIAL (CANCER CENTER ONLY)
Abs Immature Granulocytes: 0.02 10*3/uL (ref 0.00–0.07)
BASOS ABS: 0.1 10*3/uL (ref 0.0–0.1)
Basophils Relative: 1 %
Eosinophils Absolute: 0.3 10*3/uL (ref 0.0–0.5)
Eosinophils Relative: 5 %
HCT: 47.4 % (ref 39.0–52.0)
Hemoglobin: 14.9 g/dL (ref 13.0–17.0)
Immature Granulocytes: 0 %
Lymphocytes Relative: 33 %
Lymphs Abs: 2.2 10*3/uL (ref 0.7–4.0)
MCH: 28.3 pg (ref 26.0–34.0)
MCHC: 31.4 g/dL (ref 30.0–36.0)
MCV: 90.1 fL (ref 80.0–100.0)
Monocytes Absolute: 0.6 10*3/uL (ref 0.1–1.0)
Monocytes Relative: 10 %
NRBC: 0 % (ref 0.0–0.2)
Neutro Abs: 3.5 10*3/uL (ref 1.7–7.7)
Neutrophils Relative %: 51 %
Platelet Count: 165 10*3/uL (ref 150–400)
RBC: 5.26 MIL/uL (ref 4.22–5.81)
RDW: 15.3 % (ref 11.5–15.5)
WBC: 6.7 10*3/uL (ref 4.0–10.5)

## 2018-09-01 LAB — CMP (CANCER CENTER ONLY)
ALK PHOS: 98 U/L (ref 38–126)
ALT: 19 U/L (ref 0–44)
AST: 23 U/L (ref 15–41)
Albumin: 3.8 g/dL (ref 3.5–5.0)
Anion gap: 9 (ref 5–15)
BUN: 28 mg/dL — ABNORMAL HIGH (ref 8–23)
CALCIUM: 9.5 mg/dL (ref 8.9–10.3)
CHLORIDE: 101 mmol/L (ref 98–111)
CO2: 30 mmol/L (ref 22–32)
Creatinine: 1.34 mg/dL — ABNORMAL HIGH (ref 0.61–1.24)
GFR, EST AFRICAN AMERICAN: 59 mL/min — AB (ref >60)
GFR, Estimated: 51 mL/min — ABNORMAL LOW (ref >60)
Glucose, Bld: 103 mg/dL — ABNORMAL HIGH (ref 70–99)
Potassium: 4.1 mmol/L (ref 3.5–5.1)
Sodium: 140 mmol/L (ref 135–145)
Total Bilirubin: 0.9 mg/dL (ref 0.3–1.2)
Total Protein: 6.5 g/dL (ref 6.5–8.1)

## 2018-09-01 LAB — LACTATE DEHYDROGENASE: LDH: 241 U/L — ABNORMAL HIGH (ref 98–192)

## 2018-09-01 MED ORDER — SODIUM CHLORIDE 0.9 % IV SOLN
Freq: Once | INTRAVENOUS | Status: AC
  Administered 2018-09-01: 13:00:00 via INTRAVENOUS
  Filled 2018-09-01: qty 250

## 2018-09-01 MED ORDER — SODIUM CHLORIDE 0.9 % IV SOLN
200.0000 mg | Freq: Once | INTRAVENOUS | Status: AC
  Administered 2018-09-01: 200 mg via INTRAVENOUS
  Filled 2018-09-01: qty 8

## 2018-09-01 NOTE — Progress Notes (Addendum)
Hematology and Oncology Follow Up Visit  [name] [name] [id number] Aug 07, 1941 77 y.o. 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  This is actually the best that I have seen him look since we started treating him.  Again, I have to believe that treatment is working well and that his cancer is improving.  He is due for his scans next week.  He is eating better.  He had a nice Christmas and New Year's.  He is still working.  He is not driving nearly as much.  He has had no cough or shortness of breath.  He apparently did not see the pain specialist out at [facility].  He has had no leg swelling.  There is been no bleeding.  He has had no change in bowel or bladder habits.  Overall, his performance status is ECOG 1.     Medications:  Current Outpatient Medications:  .  amLODipine (NORVASC) 10 MG tablet, Take 1 tablet (10 mg total) by mouth daily., Disp: 90 tablet, Rfl: 0 .  ANORO ELLIPTA 62.5-25 MCG/INH AEPB, INHALE 1 PUFF BY MOUTH EVERY DAY, Disp: 60 each, Rfl: 3 .  aspirin EC 81 MG tablet, Take 1 tablet (81 mg total) by mouth daily., Disp: , Rfl:  .  axitinib (INLYTA) 5 MG tablet, Take 1 pill twice a day for 21 days on then 7 days off!!!, Disp: 42 tablet, Rfl: 3 .  Blood Glucose Monitoring Suppl (CONTOUR BLOOD GLUCOSE SYSTEM) DEVI, Diagnoses - Diabetes E 11.8 Check blood sugar once daily, Disp: 1 Device, Rfl: 0 .  clopidogrel (PLAVIX) 75 MG tablet, TAKE 1 TABLET BY MOUTH ONCE DAILY, Disp: 90 tablet, Rfl: 3 .  gabapentin (NEURONTIN) 300 MG capsule, One tab PO qHS for a week, then BID for a week, then TID. May double weekly to a max of 3,660m/day, Disp: 180 capsule, Rfl: 3 .  glucose blood (BAYER CONTOUR  TEST) test strip, Diagnoses - Diabetes E 11.8 Check blood sugar once daily, Disp: 100 each, Rfl: prn .  losartan (COZAAR) 100 MG tablet, TAKE 1 TABLET BY MOUTH ONCE DAILY, Disp: 90 tablet, Rfl: 3 .  metFORMIN (GLUCOPHAGE-XR) 500 MG 24 hr tablet, TAKE 2 TABLETS BY MOUTH EVERY DAY WITH BREAKFAST, Disp: 180 tablet, Rfl: 1 .  metoprolol succinate (TOPROL-XL) 100 MG 24 hr tablet, TAKE 1 TABLET BY MOUTH EVERY DAY, Disp: 90 tablet, Rfl: 0 .  Multiple Vitamins-Minerals (MULTIVITAMIN WITH MINERALS) tablet, Take 1 tablet by mouth daily., Disp: , Rfl:  .  PROAIR HFA 108 (90 Base) MCG/ACT inhaler, INHALE 2 PUFFS INTO THE LUNGS EVERY 6 HOURS AS NEEDED FOR WHEEZING, Disp: 8.5 g, Rfl: 2 .  atorvastatin (LIPITOR) 40 MG tablet, Take 1 tablet (40 mg total) by mouth daily at 6 PM. (Patient not taking: Reported on 08/22/2018), Disp: 90 tablet, Rfl: 3 .  oxyCODONE ER (XTAMPZA ER) 9 MG C12A, Take 9 mg by mouth every 12 (twelve) hours. (Patient not taking: Reported on 08/22/2018), Disp: 60 each, Rfl: 0 .  prochlorperazine (COMPAZINE) 10 MG tablet, TAKE 1 TABLET(10 MG) BY MOUTH EVERY 6 HOURS AS NEEDED FOR NAUSEA OR VOMITING (Patient not taking: Reported on 08/22/2018), Disp: 385 tablet,  Rfl: 1 .  tamsulosin (FLOMAX) 0.4 MG CAPS capsule, Take 1 capsule (0.4 mg total) by mouth daily. (Patient not taking: Reported on 08/22/2018), Disp: 90 capsule, Rfl: 3  Allergies: No Known Allergies  Past Medical History, Surgical history, Social history, and Family History were reviewed and updated.  Review of Systems: Review of Systems  Constitutional: Negative.   HENT:  Negative.   Eyes: Negative.   Respiratory: Negative.   Cardiovascular: Negative.   Gastrointestinal: Negative.   Endocrine: Negative.   Genitourinary: Negative.    Musculoskeletal: Positive for back pain.  Skin: Negative.   Neurological: Negative.   Hematological: Negative.   Psychiatric/Behavioral: Negative.     Physical Exam:  weight is 158 lb 8 oz (71.9  kg). His oral temperature is 97.4 F (36.3 C) (abnormal). His blood pressure is 148/71 (abnormal) and his pulse is 77. His respiration is 18 and oxygen saturation is 100%.   Wt Readings from Last 3 Encounters:  09/01/18 158 lb 8 oz (71.9 kg)  08/22/18 159 lb (72.1 kg)  08/11/18 160 lb (72.6 kg)    Physical Exam Vitals signs reviewed.  HENT:     Head: Normocephalic and atraumatic.  Eyes:     Pupils: Pupils are equal, round, and reactive to light.  Neck:     Musculoskeletal: Normal range of motion.  Cardiovascular:     Rate and Rhythm: Normal rate and regular rhythm.     Heart sounds: Normal heart sounds.  Pulmonary:     Effort: Pulmonary effort is normal.     Breath sounds: Normal breath sounds.  Abdominal:     General: Bowel sounds are normal.     Palpations: Abdomen is soft.  Musculoskeletal: Normal range of motion.        General: No tenderness or deformity.  Lymphadenopathy:     Cervical: No cervical adenopathy.  Skin:    General: Skin is warm and dry.     Findings: No erythema or rash.  Neurological:     Mental Status: He is alert and oriented to person, place, and time.  Psychiatric:        Behavior: Behavior normal.        Thought Content: Thought content normal.        Judgment: Judgment normal.      Lab Results  Component Value Date   WBC 6.7 09/01/2018   HGB 14.9 09/01/2018   HCT 47.4 09/01/2018   MCV 90.1 09/01/2018   PLT 165 09/01/2018     Chemistry      Component Value Date/Time   NA 140 09/01/2018 1202   K 4.1 09/01/2018 1202   CL 101 09/01/2018 1202   CO2 30 09/01/2018 1202   BUN 28 (H) 09/01/2018 1202   CREATININE 1.34 (H) 09/01/2018 1202   CREATININE 1.11 04/29/2018 1047      Component Value Date/Time   CALCIUM 9.5 09/01/2018 1202   ALKPHOS 98 09/01/2018 1202   AST 23 09/01/2018 1202   ALT 19 09/01/2018 1202   BILITOT 0.9 09/01/2018 1202       Impression and Plan: [name] [name] is a 77 year old white male.  He now has metastatic  kidney cancer.  He has a past history of prostate cancer.  Apparently there is also a history of melanoma in situ.  Currently, he now has metastatic kidney cancer.  I will be very eager to see what his CT scan looks like.  Again, I have to believe that things are improving.  We  will have the CAT scan done next week.  I will plan to see him back myself in another 4 weeks.  He will get his Delton See today.  As far as the axitinib is concerned, he is on this for 21 days and then off for 7.    [name], MD 12/12/201912:59 PM

## 2018-09-02 DIAGNOSIS — G8929 Other chronic pain: Secondary | ICD-10-CM | POA: Diagnosis not present

## 2018-09-02 DIAGNOSIS — M546 Pain in thoracic spine: Secondary | ICD-10-CM | POA: Diagnosis not present

## 2018-09-02 DIAGNOSIS — C649 Malignant neoplasm of unspecified kidney, except renal pelvis: Secondary | ICD-10-CM | POA: Diagnosis not present

## 2018-09-02 DIAGNOSIS — M7918 Myalgia, other site: Secondary | ICD-10-CM | POA: Diagnosis not present

## 2018-09-02 DIAGNOSIS — C7951 Secondary malignant neoplasm of bone: Secondary | ICD-10-CM | POA: Diagnosis not present

## 2018-09-19 ENCOUNTER — Other Ambulatory Visit: Payer: Self-pay | Admitting: *Deleted

## 2018-09-19 DIAGNOSIS — C641 Malignant neoplasm of right kidney, except renal pelvis: Secondary | ICD-10-CM

## 2018-09-19 DIAGNOSIS — C7951 Secondary malignant neoplasm of bone: Secondary | ICD-10-CM

## 2018-09-19 MED ORDER — OXYCODONE ER 9 MG PO C12A: 9.0000 mg | EXTENDED_RELEASE_CAPSULE | Freq: Two times a day (BID) | ORAL | 0 refills | Status: DC

## 2018-09-20 ENCOUNTER — Other Ambulatory Visit: Payer: Self-pay | Admitting: Family Medicine

## 2018-09-22 ENCOUNTER — Inpatient Hospital Stay: Payer: Medicare Other

## 2018-09-22 ENCOUNTER — Inpatient Hospital Stay: Payer: Medicare Other | Attending: Hematology & Oncology | Admitting: Hematology & Oncology

## 2018-09-22 ENCOUNTER — Encounter: Payer: Self-pay | Admitting: Hematology & Oncology

## 2018-09-22 ENCOUNTER — Other Ambulatory Visit: Payer: Self-pay

## 2018-09-22 ENCOUNTER — Other Ambulatory Visit: Payer: Self-pay | Admitting: *Deleted

## 2018-09-22 VITALS — BP 145/56 | HR 60 | Temp 98.2°F | Resp 18 | Wt 156.0 lb

## 2018-09-22 DIAGNOSIS — C641 Malignant neoplasm of right kidney, except renal pelvis: Secondary | ICD-10-CM | POA: Insufficient documentation

## 2018-09-22 DIAGNOSIS — Z5112 Encounter for antineoplastic immunotherapy: Secondary | ICD-10-CM | POA: Diagnosis present

## 2018-09-22 DIAGNOSIS — C7951 Secondary malignant neoplasm of bone: Secondary | ICD-10-CM | POA: Diagnosis not present

## 2018-09-22 DIAGNOSIS — Z5111 Encounter for antineoplastic chemotherapy: Secondary | ICD-10-CM | POA: Insufficient documentation

## 2018-09-22 LAB — CBC WITH DIFFERENTIAL (CANCER CENTER ONLY)
Abs Immature Granulocytes: 0.02 10*3/uL (ref 0.00–0.07)
BASOS ABS: 0.1 10*3/uL (ref 0.0–0.1)
Basophils Relative: 1 %
EOS PCT: 8 %
Eosinophils Absolute: 0.5 10*3/uL (ref 0.0–0.5)
HCT: 44.7 % (ref 39.0–52.0)
Hemoglobin: 13.4 g/dL (ref 13.0–17.0)
Immature Granulocytes: 0 %
Lymphocytes Relative: 27 %
Lymphs Abs: 1.7 10*3/uL (ref 0.7–4.0)
MCH: 28.6 pg (ref 26.0–34.0)
MCHC: 30 g/dL (ref 30.0–36.0)
MCV: 95.5 fL (ref 80.0–100.0)
Monocytes Absolute: 0.5 10*3/uL (ref 0.1–1.0)
Monocytes Relative: 8 %
NRBC: 0 % (ref 0.0–0.2)
Neutro Abs: 3.5 10*3/uL (ref 1.7–7.7)
Neutrophils Relative %: 56 %
Platelet Count: 171 10*3/uL (ref 150–400)
RBC: 4.68 MIL/uL (ref 4.22–5.81)
RDW: 16.2 % — ABNORMAL HIGH (ref 11.5–15.5)
WBC: 6.3 10*3/uL (ref 4.0–10.5)

## 2018-09-22 LAB — LACTATE DEHYDROGENASE: LDH: 194 U/L — ABNORMAL HIGH (ref 98–192)

## 2018-09-22 LAB — CMP (CANCER CENTER ONLY)
ALK PHOS: 100 U/L (ref 38–126)
ALT: 16 U/L (ref 0–44)
ANION GAP: 9 (ref 5–15)
AST: 20 U/L (ref 15–41)
Albumin: 3.7 g/dL (ref 3.5–5.0)
BUN: 27 mg/dL — ABNORMAL HIGH (ref 8–23)
CALCIUM: 8.4 mg/dL — AB (ref 8.9–10.3)
CO2: 25 mmol/L (ref 22–32)
Chloride: 109 mmol/L (ref 98–111)
Creatinine: 1.27 mg/dL — ABNORMAL HIGH (ref 0.61–1.24)
GFR, Est AFR Am: >60 mL/min (ref >60)
GFR, Estimated: 54 mL/min — ABNORMAL LOW (ref >60)
GLUCOSE: 123 mg/dL — AB (ref 70–99)
Potassium: 4.2 mmol/L (ref 3.5–5.1)
Sodium: 143 mmol/L (ref 135–145)
TOTAL PROTEIN: 6.4 g/dL — AB (ref 6.5–8.1)
Total Bilirubin: 0.5 mg/dL (ref 0.3–1.2)

## 2018-09-22 MED ORDER — SODIUM CHLORIDE 0.9 % IV SOLN
200.0000 mg | Freq: Once | INTRAVENOUS | Status: AC
  Administered 2018-09-22: 200 mg via INTRAVENOUS
  Filled 2018-09-22: qty 8

## 2018-09-22 MED ORDER — SODIUM CHLORIDE 0.9 % IV SOLN
Freq: Once | INTRAVENOUS | Status: AC
  Administered 2018-09-22: 12:00:00 via INTRAVENOUS
  Filled 2018-09-22: qty 250

## 2018-09-22 MED ORDER — OXYCODONE ER 9 MG PO C12A: 9.0000 mg | EXTENDED_RELEASE_CAPSULE | Freq: Two times a day (BID) | ORAL | 0 refills | Status: DC

## 2018-09-22 MED FILL — INLYTA 5 MG TABLET: 5 | 28 days supply | Qty: 42 | Fill #2

## 2018-09-22 NOTE — Addendum Note (Signed)
Addended by: Burney Gauze R on: 09/22/2018 11:43 AM   Modules accepted: Orders

## 2018-09-22 NOTE — Patient Instructions (Signed)
[facility] Discharge Instructions for Patients Receiving Chemotherapy  Today you received the following chemotherapy agents:  Nivolumab  To help prevent nausea and vomiting after your treatment, we encourage you to take your nausea medication as prescribed.   If you develop nausea and vomiting that is not controlled by your nausea medication, call the clinic.   BELOW ARE SYMPTOMS THAT SHOULD BE REPORTED IMMEDIATELY:  *FEVER GREATER THAN 100.5 F  *CHILLS WITH OR WITHOUT FEVER  NAUSEA AND VOMITING THAT IS NOT CONTROLLED WITH YOUR NAUSEA MEDICATION  *UNUSUAL SHORTNESS OF BREATH  *UNUSUAL BRUISING OR BLEEDING  TENDERNESS IN MOUTH AND THROAT WITH OR WITHOUT PRESENCE OF ULCERS  *URINARY PROBLEMS  *BOWEL PROBLEMS  UNUSUAL RASH Items with * indicate a potential emergency and should be followed up as soon as possible.  Feel free to call the clinic should you have any questions or concerns. The clinic phone number is [phone].  Please show the CHEMO ALERT CARD at check-in to the Emergency Department and triage nurse.   

## 2018-09-23 LAB — CANCER ANTIGEN 27.29: CA 27.29: 35.8 U/mL (ref 0.0–38.6)

## 2018-09-29 ENCOUNTER — Encounter (HOSPITAL_BASED_OUTPATIENT_CLINIC_OR_DEPARTMENT_OTHER): Payer: Self-pay

## 2018-09-29 ENCOUNTER — Ambulatory Visit (HOSPITAL_BASED_OUTPATIENT_CLINIC_OR_DEPARTMENT_OTHER)
Admission: RE | Admit: 2018-09-29 | Discharge: 2018-09-29 | Disposition: A | Discharge status: Home / Self Care | Payer: Medicare Other | Source: Ambulatory Visit | Attending: Hematology & Oncology | Admitting: Hematology & Oncology

## 2018-09-29 DIAGNOSIS — C641 Malignant neoplasm of right kidney, except renal pelvis: Secondary | ICD-10-CM

## 2018-09-29 DIAGNOSIS — C7951 Secondary malignant neoplasm of bone: Secondary | ICD-10-CM | POA: Diagnosis not present

## 2018-09-29 MED ORDER — IOPAMIDOL (ISOVUE-300) INJECTION 61%
100.0000 mL | Freq: Once | INTRAVENOUS | Status: AC | PRN
  Administered 2018-09-29: 100 mL via INTRAVENOUS

## 2018-10-03 ENCOUNTER — Telehealth: Payer: Self-pay | Admitting: *Deleted

## 2018-10-03 NOTE — Telephone Encounter (Signed)
As noted below by Dr. Ennever, I informed the patient that his cancer is shrinking. He verbalized understanding. 

## 2018-10-03 NOTE — Telephone Encounter (Signed)
-----   Message from [name], MD sent at 09/29/2018  4:54 PM EST ----- calll - the cancer is shrinking!!  [place] news!!!  Laurey Arrow

## 2018-10-05 ENCOUNTER — Telehealth: Payer: Self-pay | Admitting: Family Medicine

## 2018-10-05 NOTE — Telephone Encounter (Signed)
Pt called office requesting samples for ANORO ELLIPTA 62.5-25 MCG/INH AEPB. Pt reports PCP "gets him some when he needs it."  Will route.

## 2018-10-05 NOTE — Telephone Encounter (Signed)
Called drug representative to see if we could get samples in the office for a patient. My direct call back number was given.

## 2018-10-06 DIAGNOSIS — G8929 Other chronic pain: Secondary | ICD-10-CM | POA: Diagnosis not present

## 2018-10-06 DIAGNOSIS — G893 Neoplasm related pain (acute) (chronic): Secondary | ICD-10-CM | POA: Diagnosis not present

## 2018-10-06 DIAGNOSIS — M546 Pain in thoracic spine: Secondary | ICD-10-CM | POA: Diagnosis not present

## 2018-10-06 NOTE — Telephone Encounter (Signed)
I called the patient to let him know that I am waiting to see if we can get samples for him and he voices understanding.

## 2018-10-07 NOTE — Telephone Encounter (Signed)
Drug rep has stopped by to leave Anoro samples and [name] [name] has called the patient to let him know they are in the office. Closing encounter.

## 2018-10-13 ENCOUNTER — Inpatient Hospital Stay: Payer: Medicare Other

## 2018-10-13 ENCOUNTER — Inpatient Hospital Stay (HOSPITAL_BASED_OUTPATIENT_CLINIC_OR_DEPARTMENT_OTHER): Payer: Medicare Other | Admitting: Hematology & Oncology

## 2018-10-13 ENCOUNTER — Other Ambulatory Visit: Payer: Self-pay

## 2018-10-13 VITALS — BP 177/79 | HR 59 | Temp 97.7°F | Resp 20 | Wt 158.5 lb

## 2018-10-13 DIAGNOSIS — Z8546 Personal history of malignant neoplasm of prostate: Secondary | ICD-10-CM

## 2018-10-13 DIAGNOSIS — C641 Malignant neoplasm of right kidney, except renal pelvis: Secondary | ICD-10-CM

## 2018-10-13 DIAGNOSIS — M25511 Pain in right shoulder: Secondary | ICD-10-CM | POA: Diagnosis not present

## 2018-10-13 DIAGNOSIS — R918 Other nonspecific abnormal finding of lung field: Secondary | ICD-10-CM | POA: Diagnosis not present

## 2018-10-13 DIAGNOSIS — C7951 Secondary malignant neoplasm of bone: Secondary | ICD-10-CM

## 2018-10-13 DIAGNOSIS — Z5111 Encounter for antineoplastic chemotherapy: Secondary | ICD-10-CM | POA: Diagnosis not present

## 2018-10-13 LAB — CBC WITH DIFFERENTIAL (CANCER CENTER ONLY)
Abs Immature Granulocytes: 0.02 10*3/uL (ref 0.00–0.07)
Basophils Absolute: 0.1 10*3/uL (ref 0.0–0.1)
Basophils Relative: 1 %
EOS PCT: 5 %
Eosinophils Absolute: 0.3 10*3/uL (ref 0.0–0.5)
HCT: 44.6 % (ref 39.0–52.0)
Hemoglobin: 14.4 g/dL (ref 13.0–17.0)
Immature Granulocytes: 0 %
Lymphocytes Relative: 25 %
Lymphs Abs: 1.6 10*3/uL (ref 0.7–4.0)
MCH: 29.4 pg (ref 26.0–34.0)
MCHC: 32.3 g/dL (ref 30.0–36.0)
MCV: 91 fL (ref 80.0–100.0)
Monocytes Absolute: 0.6 10*3/uL (ref 0.1–1.0)
Monocytes Relative: 8 %
Neutro Abs: 4.1 10*3/uL (ref 1.7–7.7)
Neutrophils Relative %: 61 %
Platelet Count: 162 10*3/uL (ref 150–400)
RBC: 4.9 MIL/uL (ref 4.22–5.81)
RDW: 14.8 % (ref 11.5–15.5)
WBC Count: 6.7 10*3/uL (ref 4.0–10.5)
nRBC: 0 % (ref 0.0–0.2)

## 2018-10-13 LAB — CMP (CANCER CENTER ONLY)
ALT: 19 U/L (ref 0–44)
ANION GAP: 7 (ref 5–15)
AST: 17 U/L (ref 15–41)
Albumin: 3.6 g/dL (ref 3.5–5.0)
Alkaline Phosphatase: 99 U/L (ref 38–126)
BUN: 14 mg/dL (ref 8–23)
CO2: 31 mmol/L (ref 22–32)
Calcium: 9 mg/dL (ref 8.9–10.3)
Chloride: 104 mmol/L (ref 98–111)
Creatinine: 1.04 mg/dL (ref 0.61–1.24)
GFR, Est AFR Am: >60 mL/min (ref >60)
GFR, Estimated: >60 mL/min (ref >60)
Glucose, Bld: 173 mg/dL — ABNORMAL HIGH (ref 70–99)
Potassium: 4.3 mmol/L (ref 3.5–5.1)
Sodium: 142 mmol/L (ref 135–145)
Total Bilirubin: 0.6 mg/dL (ref 0.3–1.2)
Total Protein: 6 g/dL — ABNORMAL LOW (ref 6.5–8.1)

## 2018-10-13 LAB — LACTATE DEHYDROGENASE: LDH: 209 U/L — ABNORMAL HIGH (ref 98–192)

## 2018-10-13 MED ORDER — SODIUM CHLORIDE 0.9 % IV SOLN
Freq: Once | INTRAVENOUS | Status: AC
  Administered 2018-10-13: 11:00:00 via INTRAVENOUS
  Filled 2018-10-13: qty 250

## 2018-10-13 MED ORDER — DENOSUMAB 120 MG/1.7ML ~~LOC~~ SOLN
120.0000 mg | Freq: Once | SUBCUTANEOUS | Status: AC
  Administered 2018-10-13: 120 mg via SUBCUTANEOUS

## 2018-10-13 MED ORDER — DENOSUMAB 120 MG/1.7ML ~~LOC~~ SOLN
SUBCUTANEOUS | Status: AC
  Filled 2018-10-13: qty 1.7

## 2018-10-13 MED ORDER — SODIUM CHLORIDE 0.9 % IV SOLN
200.0000 mg | Freq: Once | INTRAVENOUS | Status: AC
  Administered 2018-10-13: 200 mg via INTRAVENOUS
  Filled 2018-10-13: qty 8

## 2018-10-13 NOTE — Patient Instructions (Signed)
Pembrolizumab injection  What is this medicine?  PEMBROLIZUMAB (pem broe liz ue mab) is a monoclonal antibody. It is used to treat cervical cancer, esophageal cancer, head and neck cancer, hepatocellular cancer, Hodgkin lymphoma, kidney cancer, lymphoma, melanoma, Merkel cell carcinoma, lung cancer, stomach cancer, urothelial cancer, and cancers that have a certain genetic condition.  This medicine may be used for other purposes; ask your health care provider or pharmacist if you have questions.  COMMON BRAND NAME(S): Keytruda  What should I tell my health care provider before I take this medicine?  They need to know if you have any of these conditions:  -diabetes  -immune system problems  -inflammatory bowel disease  -liver disease  -lung or breathing disease  -lupus  -received or scheduled to receive an organ transplant or a stem-cell transplant that uses donor stem cells  -an unusual or allergic reaction to pembrolizumab, other medicines, foods, dyes, or preservatives  -pregnant or trying to get pregnant  -breast-feeding  How should I use this medicine?  This medicine is for infusion into a vein. It is given by a health care professional in a hospital or clinic setting.  A special MedGuide will be given to you before each treatment. Be sure to read this information carefully each time.  Talk to your pediatrician regarding the use of this medicine in children. While this drug may be prescribed for selected conditions, precautions do apply.  Overdosage: If you think you have taken too much of this medicine contact a poison control center or emergency room at once.  NOTE: This medicine is only for you. Do not share this medicine with others.  What if I miss a dose?  It is important not to miss your dose. Call your doctor or health care professional if you are unable to keep an appointment.  What may interact with this medicine?  Interactions have not been studied.  Give your health care provider a list of all the  medicines, herbs, non-prescription drugs, or dietary supplements you use. Also tell them if you smoke, drink alcohol, or use illegal drugs. Some items may interact with your medicine.  This list may not describe all possible interactions. Give your health care provider a list of all the medicines, herbs, non-prescription drugs, or dietary supplements you use. Also tell them if you smoke, drink alcohol, or use illegal drugs. Some items may interact with your medicine.  What should I watch for while using this medicine?  Your condition will be monitored carefully while you are receiving this medicine.  You may need blood work done while you are taking this medicine.  Do not become pregnant while taking this medicine or for 4 months after stopping it. Women should inform their doctor if they wish to become pregnant or think they might be pregnant. There is a potential for serious side effects to an unborn child. Talk to your health care professional or pharmacist for more information. Do not breast-feed an infant while taking this medicine or for 4 months after the last dose.  What side effects may I notice from receiving this medicine?  Side effects that you should report to your doctor or health care professional as soon as possible:  -allergic reactions like skin rash, itching or hives, swelling of the face, lips, or tongue  -bloody or black, tarry  -breathing problems  -changes in vision  -chest pain  -chills  -confusion  -constipation  -cough  -diarrhea  -dizziness or feeling faint or lightheaded  -  fast or irregular heartbeat  -fever  -flushing  -hair loss  -joint pain  -low blood counts - this medicine may decrease the number of white blood cells, red blood cells and platelets. You may be at increased risk for infections and bleeding.  -muscle pain  -muscle weakness  -persistent headache  -redness, blistering, peeling or loosening of the skin, including inside the mouth  -signs and symptoms of high blood sugar  such as dizziness; dry mouth; dry skin; fruity breath; nausea; stomach pain; increased hunger or thirst; increased urination  -signs and symptoms of kidney injury like trouble passing urine or change in the amount of urine  -signs and symptoms of liver injury like dark urine, light-colored stools, loss of appetite, nausea, right upper belly pain, yellowing of the eyes or skin  -sweating  -swollen lymph nodes  -weight loss  Side effects that usually do not require medical attention (report to your doctor or health care professional if they continue or are bothersome):  -decreased appetite  -muscle pain  -tiredness  This list may not describe all possible side effects. Call your doctor for medical advice about side effects. You may report side effects to FDA at 1-800-FDA-1088.  Where should I keep my medicine?  This drug is given in a hospital or clinic and will not be stored at home.  NOTE: This sheet is a summary. It may not cover all possible information. If you have questions about this medicine, talk to your doctor, pharmacist, or health care provider.   2019 Elsevier/Gold Standard (2018-04-21 15:06:10)

## 2018-10-13 NOTE — Progress Notes (Signed)
Hematology and Oncology Follow Up Visit  [name] [name] [id number] 1940/10/03 78 y.o. 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  [name] [name] is doing okay.  The problem is that [name] [name] is having pain in the right shoulder blade.  I suspect this probably is referred pain from a pathologic fracture that [name] [name] has at T3.  We did go ahead and do his CT scans.  There were done September 29, 2018.  The CT scans show that his cancer was actually shrinking.  The primary tumor in the right kidney is now 2.6 x 2 cm.  [name] [name] has decreased T3 and T4 osseous metastasis.  There is stable disease in the left hip and femur.  [name] [name] has small pulmonary nodules in the right lung that are stable.  I am not sure if [name] [name] can have kyphoplasty at T3.  We will see if radiology can do this.  [name] [name] is still working part-time.  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  [name] [name] is eating well.  [name] [name] is not gaining weight but eating.  [name] [name] has had no bleeding.  [name] [name] has had no problems with bowels or bladder.    Overall, his performance status is ECOG 1.     Medications:  Current Outpatient Medications:  .  amLODipine (NORVASC) 10 MG tablet, Take 1 tablet (10 mg total) by mouth daily., Disp: 90 tablet, Rfl: 0 .  ANORO ELLIPTA 62.5-25 MCG/INH AEPB, INHALE 1 PUFF BY MOUTH EVERY DAY, Disp: 60 each, Rfl: 3 .  aspirin EC 81 MG tablet, Take 1 tablet (81 mg total) by mouth daily., Disp: , Rfl:  .  atorvastatin (LIPITOR) 40 MG tablet, Take 1 tablet (40 mg total) by mouth daily at 6 PM., Disp: 90 tablet, Rfl: 3 .  axitinib (INLYTA) 5 MG tablet, Take 1 pill twice a day for 21 days on then 7 days off!!!, Disp: 42 tablet, Rfl: 3 .  Blood Glucose Monitoring Suppl (CONTOUR BLOOD GLUCOSE SYSTEM)  DEVI, Diagnoses - Diabetes E 11.8 Check blood sugar once daily, Disp: 1 Device, Rfl: 0 .  clopidogrel (PLAVIX) 75 MG tablet, TAKE 1 TABLET BY MOUTH ONCE DAILY, Disp: 90 tablet, Rfl: 3 .  doxazosin (CARDURA) 4 MG tablet, Take 4 mg by mouth., Disp: , Rfl:  .  gabapentin (NEURONTIN) 300 MG capsule, One tab PO qHS for a week, then BID for a week, then TID. May double weekly to a max of 3,661m/day, Disp: 180 capsule, Rfl: 3 .  glucose blood (BAYER CONTOUR TEST) test strip, Diagnoses - Diabetes E 11.8 Check blood sugar once daily, Disp: 100 each, Rfl: prn .  losartan (COZAAR) 100 MG tablet, TAKE 1 TABLET BY MOUTH ONCE DAILY, Disp: 90 tablet, Rfl: 3 .  metFORMIN (GLUCOPHAGE-XR) 500 MG 24 hr tablet, TAKE 2 TABLETS BY MOUTH EVERY DAY WITH BREAKFAST, Disp: 180 tablet, Rfl: 1 .  metoprolol succinate (TOPROL-XL) 100 MG 24 hr tablet, TAKE 1 TABLET BY MOUTH EVERY DAY, Disp: 90 tablet, Rfl: 0 .  Multiple Vitamins-Minerals (MULTIVITAMIN WITH MINERALS) tablet, Take 1 tablet by mouth daily., Disp: , Rfl:  .  oxyCODONE ER (XTAMPZA ER) 9 MG C12A, Take 9 mg by mouth every 12 (  twelve) hours., Disp: 60 each, Rfl: 0 .  PROAIR HFA 108 (90 Base) MCG/ACT inhaler, INHALE 2 PUFFS INTO THE LUNGS EVERY 6 HOURS AS NEEDED FOR WHEEZING, Disp: 8.5 g, Rfl: 2 .  tamsulosin (FLOMAX) 0.4 MG CAPS capsule, Take 1 capsule (0.4 mg total) by mouth daily., Disp: 90 capsule, Rfl: 3 .  prochlorperazine (COMPAZINE) 10 MG tablet, TAKE 1 TABLET(10 MG) BY MOUTH EVERY 6 HOURS AS NEEDED FOR NAUSEA OR VOMITING (Patient not taking: Reported on 08/22/2018), Disp: 385 tablet, Rfl: 1  Allergies: No Known Allergies  Past Medical History, Surgical history, Social history, and Family History were reviewed and updated.  Review of Systems: Review of Systems  Constitutional: Negative.   HENT:  Negative.   Eyes: Negative.   Respiratory: Negative.   Cardiovascular: Negative.   Gastrointestinal: Negative.   Endocrine: Negative.   Genitourinary: Negative.     Musculoskeletal: Positive for back pain.  Skin: Negative.   Neurological: Negative.   Hematological: Negative.   Psychiatric/Behavioral: Negative.     Physical Exam:  weight is 158 lb 8 oz (71.9 kg). His oral temperature is 97.7 F (36.5 C). His blood pressure is 177/79 (abnormal) and his pulse is 59 (abnormal). His respiration is 20 and oxygen saturation is 100%.   Wt Readings from Last 3 Encounters:  10/13/18 158 lb 8 oz (71.9 kg)  09/22/18 156 lb (70.8 kg)  09/01/18 158 lb 8 oz (71.9 kg)    Physical Exam Vitals signs reviewed.  HENT:     Head: Normocephalic and atraumatic.  Eyes:     Pupils: Pupils are equal, round, and reactive to light.  Neck:     Musculoskeletal: Normal range of motion.  Cardiovascular:     Rate and Rhythm: Normal rate and regular rhythm.     Heart sounds: Normal heart sounds.  Pulmonary:     Effort: Pulmonary effort is normal.     Breath sounds: Normal breath sounds.  Abdominal:     General: Bowel sounds are normal.     Palpations: Abdomen is soft.  Musculoskeletal: Normal range of motion.        General: No tenderness or deformity.  Lymphadenopathy:     Cervical: No cervical adenopathy.  Skin:    General: Skin is warm and dry.     Findings: No erythema or rash.  Neurological:     Mental Status: [name] [name] is alert and oriented to person, place, and time.  Psychiatric:        Behavior: Behavior normal.        Thought Content: Thought content normal.        Judgment: Judgment normal.      Lab Results  Component Value Date   WBC 6.7 10/13/2018   HGB 14.4 10/13/2018   HCT 44.6 10/13/2018   MCV 91.0 10/13/2018   PLT 162 10/13/2018     Chemistry      Component Value Date/Time   NA 143 09/22/2018 0951   K 4.2 09/22/2018 0951   CL 109 09/22/2018 0951   CO2 25 09/22/2018 0951   BUN 27 (H) 09/22/2018 0951   CREATININE 1.27 (H) 09/22/2018 0951   CREATININE 1.11 04/29/2018 1047      Component Value Date/Time   CALCIUM 8.4 (L) 09/22/2018  0951   ALKPHOS 100 09/22/2018 0951   AST 20 09/22/2018 0951   ALT 16 09/22/2018 0951   BILITOT 0.5 09/22/2018 0951       Impression and Plan: [name] [name] is a 78 year old white  male.  [name] [name] now has metastatic kidney cancer.  [name] [name] has a past history of prostate cancer.  Apparently there is also a history of melanoma in situ.  Currently, [name] [name] now has metastatic kidney cancer.  I am glad that [name] [name] is responding.  As such, we will continue him on treatment.    I will see if interventional radiology feels that kyphoplasty can help at T3.  We will proceed with his next cycle of treatment.  I think that the Inlyta being given 21 days on and 7 days off does seem to work for him.  We will plan to get him back to see [place] in another 3 weeks.    [name], MD 1/23/202010:04 AM

## 2018-10-14 ENCOUNTER — Encounter: Payer: Self-pay | Admitting: Cardiovascular Disease

## 2018-10-17 ENCOUNTER — Other Ambulatory Visit: Payer: Self-pay | Admitting: Hematology & Oncology

## 2018-10-17 DIAGNOSIS — S22000A Wedge compression fracture of unspecified thoracic vertebra, initial encounter for closed fracture: Secondary | ICD-10-CM

## 2018-10-19 MED FILL — INLYTA 5 MG TABLET: 5 | 28 days supply | Qty: 42 | Fill #3

## 2018-10-20 ENCOUNTER — Ambulatory Visit: Payer: Medicare Other

## 2018-10-20 ENCOUNTER — Ambulatory Visit: Payer: Medicare Other | Admitting: Hematology & Oncology

## 2018-10-20 ENCOUNTER — Other Ambulatory Visit: Payer: Medicare Other

## 2018-10-21 ENCOUNTER — Encounter: Payer: Self-pay | Admitting: Family Medicine

## 2018-10-21 ENCOUNTER — Ambulatory Visit (INDEPENDENT_AMBULATORY_CARE_PROVIDER_SITE_OTHER): Payer: Medicare Other | Admitting: Family Medicine

## 2018-10-21 VITALS — BP 130/78 | HR 61 | Ht 70.0 in | Wt 162.0 lb

## 2018-10-21 DIAGNOSIS — M8458XA Pathological fracture in neoplastic disease, other specified site, initial encounter for fracture: Secondary | ICD-10-CM

## 2018-10-21 DIAGNOSIS — E119 Type 2 diabetes mellitus without complications: Secondary | ICD-10-CM | POA: Diagnosis not present

## 2018-10-21 DIAGNOSIS — J449 Chronic obstructive pulmonary disease, unspecified: Secondary | ICD-10-CM

## 2018-10-21 DIAGNOSIS — I10 Essential (primary) hypertension: Secondary | ICD-10-CM

## 2018-10-21 LAB — POCT GLYCOSYLATED HEMOGLOBIN (HGB A1C): Hemoglobin A1C: 6 % — AB (ref 4.0–5.6)

## 2018-10-21 NOTE — Progress Notes (Signed)
Subjective:    CC: DM  HPI:  Diabetes - no hypoglycemic events. No wounds or sores that are not healing well. No increased thirst or urination. Checking glucose at home. Taking medications as prescribed without any side effects.  Hypertension- Pt denies chest pain, SOB, dizziness, or heart palpitations.  Taking meds as directed w/o problems.  Denies medication side effects.    Still having some pain in his back near his shoulder blade.  He is scheduled for consultation for possible kyphoplasty next week. He also report joint pain and stiffness in his hands as well.  Is diagnosed with kidney cancer as the primary with neoplasm.  He is following with Dr. Burney Gauze.  He had a nerve block for the serratus anterior muscle which did help some of his side pain but did not help the pain underneath his shoulder.  COPD-he is doing well.  We have some Anora samples for him today.  Some flares or exacerbations.  Past medical history, Surgical history, Family history not pertinant except as noted below, Social history, Allergies, and medications have been entered into the medical record, reviewed, and corrections made.   Review of Systems: No fevers, chills, night sweats, weight loss, chest pain, or shortness of breath.   Objective:    General: Well Developed, well nourished, and in no acute distress.  Neuro: Alert and oriented x3, extra-ocular muscles intact, sensation grossly intact.  HEENT: Normocephalic, atraumatic  Skin: Warm and dry, no rashes. Cardiac: Regular rate and rhythm, no murmurs rubs or gallops, no lower extremity edema.  Respiratory: Clear to auscultation bilaterally. Not using accessory muscles, speaking in full sentences.   Impression and Recommendations:    DM - Well controlled. Continue current regimen. Follow up in  4 months.    HTN -blood pressure well controlled.  Pathologic fracture of the vertebrae - Side pain is improved but still having some shoulder pain.  We  will follow-up with Dr. Marin Olp.  He is hoping that possible kyphoplasty will be helpful.  CT abdomen revealed a severe T3 pathologic fracture underlying lytic lesion but had decreased in size noted to prior imaging.  OPD-stable.  No recent flares or exacerbations.

## 2018-10-24 ENCOUNTER — Other Ambulatory Visit: Payer: Self-pay | Admitting: Family Medicine

## 2018-10-24 ENCOUNTER — Other Ambulatory Visit: Payer: Self-pay | Admitting: *Deleted

## 2018-10-24 DIAGNOSIS — C7951 Secondary malignant neoplasm of bone: Secondary | ICD-10-CM

## 2018-10-24 DIAGNOSIS — M8458XA Pathological fracture in neoplastic disease, other specified site, initial encounter for fracture: Secondary | ICD-10-CM | POA: Insufficient documentation

## 2018-10-24 DIAGNOSIS — M79652 Pain in left thigh: Secondary | ICD-10-CM

## 2018-10-24 DIAGNOSIS — M4850XS Collapsed vertebra, not elsewhere classified, site unspecified, sequela of fracture: Secondary | ICD-10-CM

## 2018-10-25 ENCOUNTER — Other Ambulatory Visit: Payer: Medicare Other

## 2018-10-25 ENCOUNTER — Other Ambulatory Visit: Payer: Self-pay | Admitting: *Deleted

## 2018-10-25 ENCOUNTER — Telehealth: Payer: Self-pay | Admitting: Radiology

## 2018-10-25 ENCOUNTER — Telehealth: Payer: Self-pay | Admitting: Interventional Radiology

## 2018-10-25 DIAGNOSIS — C641 Malignant neoplasm of right kidney, except renal pelvis: Secondary | ICD-10-CM

## 2018-10-25 DIAGNOSIS — C7951 Secondary malignant neoplasm of bone: Secondary | ICD-10-CM

## 2018-10-25 NOTE — Telephone Encounter (Signed)
Phoned patient to cancel consult for kyphoplasty per Dr Pascal Lux.  Patient is not a candidate for kyphoplasty.  Dr. Antonieta Pert office has been notified and will be in contact with patient.    Awad Gladd Green, RN 10/25/2018 9:10 AM

## 2018-10-25 NOTE — Telephone Encounter (Signed)
Patient had been referred to the interventional radiology clinic for evaluation for image guided kyphoplasty/osteocool of lytic lesions involving the T3 and T4 vertebral bodies, however images were reviewed prior to the patient's appointment and given the extent of collapse and near complete absence of the T3 vertebral body as well as significant retropulsion demonstrated on thoracic spine MRI performed 06/19/2018, the patient is NOT a candidate for either a kyphoplasty or osteocool.     This was discussed with the patient prior to his appointment by the IR clinical staff and the patient did not wish to present for formal consultation.  If intervention is desired, would recommend neurosurgical consultation for potential operative fixation.  Ronny Bacon, MD Pager #: [phone]

## 2018-10-26 DIAGNOSIS — M25511 Pain in right shoulder: Secondary | ICD-10-CM | POA: Diagnosis not present

## 2018-10-26 DIAGNOSIS — G8929 Other chronic pain: Secondary | ICD-10-CM | POA: Diagnosis not present

## 2018-10-26 DIAGNOSIS — M7551 Bursitis of right shoulder: Secondary | ICD-10-CM | POA: Diagnosis not present

## 2018-10-26 MED ORDER — OXYCODONE ER 9 MG PO C12A: 1.0000 | EXTENDED_RELEASE_CAPSULE | Freq: Two times a day (BID) | ORAL | 0 refills | Status: DC

## 2018-10-27 ENCOUNTER — Other Ambulatory Visit: Payer: Self-pay | Admitting: Family

## 2018-10-27 DIAGNOSIS — E032 Hypothyroidism due to medicaments and other exogenous substances: Secondary | ICD-10-CM

## 2018-10-27 NOTE — Progress Notes (Signed)
Patient ID: [name] [name], male   DOB: March 12, 1941, 78 y.o.   MRN: [id number]      [name] [name] is seen today in F/U for CAD, HTN and elevated chol. Still selling rope. I believe he's had a stent to the OM in 96 and subsequent intervention to LAD. 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 He is not having any SSCP, palpitatoins SOB or edema. He has been compliant with his meds. His LDL has been under 100 with normal LFT's He has moderate carotid disease   Reviewed duplex from  04/30/17   and stable  Left ICA  40-59%  Due for repeat       Has chronic venous insuf with occasional skin breakdown at ankles  [facility]en [facility] 11/19/16 with SVT  K low labs otherwise ok TSH 3.41  Cardioverted to NSR  6 mg adenosine put on Toprol Seen again for SVT [facility] 09/21/17 ECG with SVT RBBB rate 172   Discussed EP referral but he only gets one episode / year and he defers for now Has venous disease especially in LLL. Some skin discoloration. 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  All other systems reviewed and negative  General: BP (!) 180/100   Pulse 75   Ht 5\' 10"  (1.778 m)   Wt 158 lb 12.8 oz (72 kg)   SpO2 97%   BMI 22.79 kg/m  Affect appropriate Healthy:  appears stated age 14: normal Neck supple with no adenopathy JVP normal left  bruits no thyromegaly Lungs clear with no wheezing and good diaphragmatic motion Heart:  S1/S2 no murmur, no rub, gallop or click PMI normal Abdomen: benighn, BS positve, no tenderness, no AAA no bruit.  No HSM or HJR Distal pulses intact with no bruits LE edema with mild erythema inside left leg  Neuro non-focal Skin warm and dry No muscular weakness     Current Outpatient  Medications  Medication Sig Dispense Refill  . amLODipine (NORVASC) 10 MG tablet Take 1 tablet (10 mg total) by mouth daily. 90 tablet 0  . ANORO ELLIPTA 62.5-25 MCG/INH AEPB INHALE 1 PUFF BY MOUTH EVERY DAY 60 each 3  . aspirin EC 81 MG tablet Take 1 tablet (81 mg total) by mouth daily.    Marland Kitchen axitinib (INLYTA) 5 MG tablet Take 1 pill twice a day for 21 days on then 7 days off!!! 42 tablet 3  . Blood Glucose Monitoring Suppl (CONTOUR BLOOD GLUCOSE SYSTEM) DEVI Diagnoses - Diabetes E 11.8 Check blood sugar once daily 1 Device 0  . clopidogrel (PLAVIX) 75 MG tablet TAKE 1 TABLET BY MOUTH ONCE DAILY 90 tablet 3  . doxazosin (CARDURA) 4 MG tablet Take 4 mg by mouth.    . gabapentin (NEURONTIN) 300 MG capsule One tab PO qHS for a week, then BID for a week, then TID. May double weekly to a max of 3,600mg /day 180 capsule 3  . glucose blood (BAYER CONTOUR TEST) test strip Diagnoses - Diabetes E 11.8 Check blood sugar once daily 100 each prn  . losartan (COZAAR) 100 MG tablet TAKE 1 TABLET BY MOUTH ONCE DAILY 90 tablet 3  . metFORMIN (GLUCOPHAGE-XR) 500 MG 24 hr tablet TAKE 2 TABLETS BY MOUTH EVERY DAY WITH BREAKFAST  180 tablet 1  . metoprolol succinate (TOPROL-XL) 100 MG 24 hr tablet TAKE 1 TABLET BY MOUTH EVERY DAY 90 tablet 1  . Multiple Vitamins-Minerals (MULTIVITAMIN WITH MINERALS) tablet Take 1 tablet by mouth daily.    Marland Kitchen oxyCODONE ER (XTAMPZA ER) 9 MG C12A Take 1 tablet by mouth every 12 (twelve) hours. 60 each 0  . PROAIR HFA 108 (90 Base) MCG/ACT inhaler INHALE 2 PUFFS INTO THE LUNGS EVERY 6 HOURS AS NEEDED FOR WHEEZING 8.5 g 2  . prochlorperazine (COMPAZINE) 10 MG tablet TAKE 1 TABLET(10 MG) BY MOUTH EVERY 6 HOURS AS NEEDED FOR NAUSEA OR VOMITING 385 tablet 1   No current facility-administered medications for this visit.     Allergies  Patient has no known allergies.  Electrocardiogram:    07/23/17 SB rate 53 PR 226 PVC RBBB  Assessment and Plan  CAD: distant stent OM 96 continue medical  Rx  HTN:  Well controlled.  Continue current medications and low sodium Dash type diet.    Bradycardia:  Had lowered beta blocker and now on Toprol due to SVT HR 60's ok for now  Chol:  Labs with primary continue statin .  Bruit:  Left ICA 40-59%  stenosis.  No TIA symptoms.  Continue antiplatelet Rx and F/U carotid duplex ordered  Edema:  Dependant from chronic venous disease improved with HCTZ refer to [facility] To see if he has reflux that would be amenable to ablative RX   SVT: on beta blocker only one / episode per year defer EP evaluation until episodes more frequent Per patient request   Carotid duplex ordered   Jenkins Rouge

## 2018-10-31 ENCOUNTER — Telehealth: Payer: Self-pay

## 2018-10-31 NOTE — Telephone Encounter (Signed)
Darnelle Maffucci from [facility] called and states he can not admit Savian for [facility] PT due to the fact he is not home bound.

## 2018-10-31 NOTE — Telephone Encounter (Signed)
Okay, please call patient and see if he would be willing to do formal PT either here at our location or somewhere close to his home.

## 2018-11-01 ENCOUNTER — Ambulatory Visit: Payer: Medicare Other | Admitting: Cardiovascular Disease

## 2018-11-01 NOTE — Telephone Encounter (Signed)
Patient advised of recommendations. He states he will call back when he can afford PT.

## 2018-11-02 ENCOUNTER — Other Ambulatory Visit: Payer: Self-pay | Admitting: *Deleted

## 2018-11-02 ENCOUNTER — Telehealth: Payer: Self-pay | Admitting: Hematology & Oncology

## 2018-11-02 DIAGNOSIS — C641 Malignant neoplasm of right kidney, except renal pelvis: Secondary | ICD-10-CM

## 2018-11-02 NOTE — Telephone Encounter (Signed)
Appointment for LAB r/s from 12:00 to 11:45 patient aware and is ok with new time

## 2018-11-03 ENCOUNTER — Encounter: Payer: Self-pay | Admitting: Hematology & Oncology

## 2018-11-03 ENCOUNTER — Inpatient Hospital Stay: Payer: Medicare Other

## 2018-11-03 ENCOUNTER — Inpatient Hospital Stay: Payer: Medicare Other | Attending: Hematology & Oncology

## 2018-11-03 ENCOUNTER — Inpatient Hospital Stay (HOSPITAL_BASED_OUTPATIENT_CLINIC_OR_DEPARTMENT_OTHER): Payer: Medicare Other | Admitting: Hematology & Oncology

## 2018-11-03 ENCOUNTER — Other Ambulatory Visit: Payer: Self-pay

## 2018-11-03 VITALS — BP 171/83 | HR 68 | Temp 97.9°F | Resp 18 | Wt 157.5 lb

## 2018-11-03 DIAGNOSIS — C7951 Secondary malignant neoplasm of bone: Secondary | ICD-10-CM | POA: Diagnosis not present

## 2018-11-03 DIAGNOSIS — Z5112 Encounter for antineoplastic immunotherapy: Secondary | ICD-10-CM | POA: Insufficient documentation

## 2018-11-03 DIAGNOSIS — G893 Neoplasm related pain (acute) (chronic): Secondary | ICD-10-CM | POA: Insufficient documentation

## 2018-11-03 DIAGNOSIS — Z79899 Other long term (current) drug therapy: Secondary | ICD-10-CM | POA: Insufficient documentation

## 2018-11-03 DIAGNOSIS — Z8546 Personal history of malignant neoplasm of prostate: Secondary | ICD-10-CM

## 2018-11-03 DIAGNOSIS — C641 Malignant neoplasm of right kidney, except renal pelvis: Secondary | ICD-10-CM | POA: Diagnosis not present

## 2018-11-03 DIAGNOSIS — E032 Hypothyroidism due to medicaments and other exogenous substances: Secondary | ICD-10-CM

## 2018-11-03 LAB — CMP (CANCER CENTER ONLY)
ALT: 20 U/L (ref 0–44)
AST: 19 U/L (ref 15–41)
Albumin: 3.9 g/dL (ref 3.5–5.0)
Alkaline Phosphatase: 122 U/L (ref 38–126)
Anion gap: 9 (ref 5–15)
BUN: 15 mg/dL (ref 8–23)
CO2: 28 mmol/L (ref 22–32)
Calcium: 9.8 mg/dL (ref 8.9–10.3)
Chloride: 101 mmol/L (ref 98–111)
Creatinine: 1.03 mg/dL (ref 0.61–1.24)
GFR, Est AFR Am: >60 mL/min (ref >60)
GFR, Estimated: >60 mL/min (ref >60)
Glucose, Bld: 124 mg/dL — ABNORMAL HIGH (ref 70–99)
Potassium: 3.9 mmol/L (ref 3.5–5.1)
Sodium: 138 mmol/L (ref 135–145)
TOTAL PROTEIN: 6.8 g/dL (ref 6.5–8.1)
Total Bilirubin: 0.6 mg/dL (ref 0.3–1.2)

## 2018-11-03 LAB — CBC WITH DIFFERENTIAL (CANCER CENTER ONLY)
Abs Immature Granulocytes: 0.02 10*3/uL (ref 0.00–0.07)
BASOS ABS: 0.1 10*3/uL (ref 0.0–0.1)
Basophils Relative: 1 %
Eosinophils Absolute: 0.6 10*3/uL — ABNORMAL HIGH (ref 0.0–0.5)
Eosinophils Relative: 8 %
HCT: 47.1 % (ref 39.0–52.0)
Hemoglobin: 15.4 g/dL (ref 13.0–17.0)
Immature Granulocytes: 0 %
Lymphocytes Relative: 18 %
Lymphs Abs: 1.4 10*3/uL (ref 0.7–4.0)
MCH: 28.9 pg (ref 26.0–34.0)
MCHC: 32.7 g/dL (ref 30.0–36.0)
MCV: 88.4 fL (ref 80.0–100.0)
Monocytes Absolute: 0.6 10*3/uL (ref 0.1–1.0)
Monocytes Relative: 8 %
NEUTROS PCT: 65 %
Neutro Abs: 4.9 10*3/uL (ref 1.7–7.7)
Platelet Count: 219 10*3/uL (ref 150–400)
RBC: 5.33 MIL/uL (ref 4.22–5.81)
RDW: 14.2 % (ref 11.5–15.5)
WBC Count: 7.6 10*3/uL (ref 4.0–10.5)
nRBC: 0 % (ref 0.0–0.2)

## 2018-11-03 LAB — LACTATE DEHYDROGENASE: LDH: 240 U/L — ABNORMAL HIGH (ref 98–192)

## 2018-11-03 MED ORDER — SODIUM CHLORIDE 0.9 % IV SOLN
200.0000 mg | Freq: Once | INTRAVENOUS | Status: AC
  Administered 2018-11-03: 200 mg via INTRAVENOUS
  Filled 2018-11-03: qty 8

## 2018-11-03 MED ORDER — SODIUM CHLORIDE 0.9 % IV SOLN
Freq: Once | INTRAVENOUS | Status: AC
  Administered 2018-11-03: 14:00:00 via INTRAVENOUS
  Filled 2018-11-03: qty 250

## 2018-11-03 NOTE — Progress Notes (Signed)
Hematology and Oncology Follow Up Visit  [name] [name] [id number] Feb 02, 1941 78 y.o. 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  He is doing okay.  He is still working.  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  Is still having problems with his left hip.  He sees another doctor at [facility]  He does not look like they really have been able to help him out.  He is on long-acting OxyContin and this is helping a little bit.  Unfortunately, interventional radiology could not do anything for his T3 lesion.  They cannot do kyphoplasty.  I would not put him through any type of open procedure as this I think would affect his quality of life.  His blood sugars have been under fairly good control.  He has had no issues with nausea or vomiting.  There is no leg swelling.  He has had no problems with bowels or bladder.  He is had no cough.  There has been no shortness of breath.  He has had no headaches.  Overall, his performance status is ECOG 1.      Medications:  Current Outpatient Medications:  .  amLODipine (NORVASC) 10 MG tablet, Take 1 tablet (10 mg total) by mouth daily., Disp: 90 tablet, Rfl: 0 .  ANORO ELLIPTA 62.5-25 MCG/INH AEPB, INHALE 1 PUFF BY MOUTH EVERY DAY, Disp: 60 each, Rfl: 3 .  aspirin EC 81 MG tablet, Take 1 tablet (81 mg total) by mouth daily., Disp: , Rfl:  .  axitinib (INLYTA) 5 MG tablet, Take 1 pill twice a day for 21 days on then 7 days off!!!, Disp: 42 tablet, Rfl: 3 .  Blood Glucose Monitoring Suppl (CONTOUR BLOOD GLUCOSE SYSTEM) DEVI, Diagnoses - Diabetes E 11.8 Check blood sugar once daily, Disp: 1 Device, Rfl: 0 .  clopidogrel (PLAVIX) 75  MG tablet, TAKE 1 TABLET BY MOUTH ONCE DAILY, Disp: 90 tablet, Rfl: 3 .  doxazosin (CARDURA) 4 MG tablet, Take 4 mg by mouth., Disp: , Rfl:  .  gabapentin (NEURONTIN) 300 MG capsule, One tab PO qHS for a week, then BID for a week, then TID. May double weekly to a max of 3,671m/day, Disp: 180 capsule, Rfl: 3 .  glucose blood (BAYER CONTOUR TEST) test strip, Diagnoses - Diabetes E 11.8 Check blood sugar once daily, Disp: 100 each, Rfl: prn .  losartan (COZAAR) 100 MG tablet, TAKE 1 TABLET BY MOUTH ONCE DAILY, Disp: 90 tablet, Rfl: 3 .  metFORMIN (GLUCOPHAGE-XR) 500 MG 24 hr tablet, TAKE 2 TABLETS BY MOUTH EVERY DAY WITH BREAKFAST, Disp: 180 tablet, Rfl: 1 .  metoprolol succinate (TOPROL-XL) 100 MG 24 hr tablet, TAKE 1 TABLET BY MOUTH EVERY DAY, Disp: 90 tablet, Rfl: 1 .  Multiple Vitamins-Minerals (MULTIVITAMIN WITH MINERALS) tablet, Take 1 tablet by mouth daily., Disp: , Rfl:  .  oxyCODONE ER (XTAMPZA ER) 9 MG C12A, Take 1 tablet by mouth every 12 (twelve) hours., Disp: 60 each, Rfl: 0 .  PROAIR HFA 108 (90 Base) MCG/ACT inhaler, INHALE 2 PUFFS INTO THE LUNGS EVERY 6 HOURS AS NEEDED FOR WHEEZING, Disp: 8.5 g, Rfl:  2 .  prochlorperazine (COMPAZINE) 10 MG tablet, TAKE 1 TABLET(10 MG) BY MOUTH EVERY 6 HOURS AS NEEDED FOR NAUSEA OR VOMITING, Disp: 385 tablet, Rfl: 1  Allergies: No Known Allergies  Past Medical History, Surgical history, Social history, and Family History were reviewed and updated.  Review of Systems: Review of Systems  Constitutional: Negative.   HENT:  Negative.   Eyes: Negative.   Respiratory: Negative.   Cardiovascular: Negative.   Gastrointestinal: Negative.   Endocrine: Negative.   Genitourinary: Negative.    Musculoskeletal: Positive for back pain.  Skin: Negative.   Neurological: Negative.   Hematological: Negative.   Psychiatric/Behavioral: Negative.     Physical Exam:  weight is 157 lb 8 oz (71.4 kg). His oral temperature is 97.9 F (36.6 C). His blood  pressure is 171/83 (abnormal) and his pulse is 68. His respiration is 18 and oxygen saturation is 98%.   Wt Readings from Last 3 Encounters:  11/03/18 157 lb 8 oz (71.4 kg)  10/21/18 162 lb (73.5 kg)  10/13/18 158 lb 8 oz (71.9 kg)    Physical Exam Vitals signs reviewed.  HENT:     Head: Normocephalic and atraumatic.  Eyes:     Pupils: Pupils are equal, round, and reactive to light.  Neck:     Musculoskeletal: Normal range of motion.  Cardiovascular:     Rate and Rhythm: Normal rate and regular rhythm.     Heart sounds: Normal heart sounds.  Pulmonary:     Effort: Pulmonary effort is normal.     Breath sounds: Normal breath sounds.  Abdominal:     General: Bowel sounds are normal.     Palpations: Abdomen is soft.  Musculoskeletal: Normal range of motion.        General: No tenderness or deformity.  Lymphadenopathy:     Cervical: No cervical adenopathy.  Skin:    General: Skin is warm and dry.     Findings: No erythema or rash.  Neurological:     Mental Status: He is alert and oriented to person, place, and time.  Psychiatric:        Behavior: Behavior normal.        Thought Content: Thought content normal.        Judgment: Judgment normal.      Lab Results  Component Value Date   WBC 7.6 11/03/2018   HGB 15.4 11/03/2018   HCT 47.1 11/03/2018   MCV 88.4 11/03/2018   PLT 219 11/03/2018     Chemistry      Component Value Date/Time   NA 138 11/03/2018 1123   K 3.9 11/03/2018 1123   CL 101 11/03/2018 1123   CO2 28 11/03/2018 1123   BUN 15 11/03/2018 1123   CREATININE 1.03 11/03/2018 1123   CREATININE 1.11 04/29/2018 1047      Component Value Date/Time   CALCIUM 9.8 11/03/2018 1123   ALKPHOS 122 11/03/2018 1123   AST 19 11/03/2018 1123   ALT 20 11/03/2018 1123   BILITOT 0.6 11/03/2018 1123       Impression and Plan: [name] [name] is a 78 year old white male.  He now has metastatic kidney cancer.  He has a past history of prostate cancer.  Apparently there  is also a history of melanoma in situ.  Currently, he now has metastatic kidney cancer.  I am glad that he is responding.  As such, we will continue him on treatment.    We will proceed with his next cycle of pembrolizumab.  I think he is doing quite well with this and responding.  We will have him come back in another 3 weeks.    [name], MD 2/13/20201:49 PM

## 2018-11-03 NOTE — Patient Instructions (Signed)
Pembrolizumab injection  What is this medicine?  PEMBROLIZUMAB (pem broe liz ue mab) is a monoclonal antibody. It is used to treat cervical cancer, esophageal cancer, head and neck cancer, hepatocellular cancer, Hodgkin lymphoma, kidney cancer, lymphoma, melanoma, Merkel cell carcinoma, lung cancer, stomach cancer, urothelial cancer, and cancers that have a certain genetic condition.  This medicine may be used for other purposes; ask your health care provider or pharmacist if you have questions.  COMMON BRAND NAME(S): Keytruda  What should I tell my health care provider before I take this medicine?  They need to know if you have any of these conditions:  -diabetes  -immune system problems  -inflammatory bowel disease  -liver disease  -lung or breathing disease  -lupus  -received or scheduled to receive an organ transplant or a stem-cell transplant that uses donor stem cells  -an unusual or allergic reaction to pembrolizumab, other medicines, foods, dyes, or preservatives  -pregnant or trying to get pregnant  -breast-feeding  How should I use this medicine?  This medicine is for infusion into a vein. It is given by a health care professional in a hospital or clinic setting.  A special MedGuide will be given to you before each treatment. Be sure to read this information carefully each time.  Talk to your pediatrician regarding the use of this medicine in children. While this drug may be prescribed for selected conditions, precautions do apply.  Overdosage: If you think you have taken too much of this medicine contact a poison control center or emergency room at once.  NOTE: This medicine is only for you. Do not share this medicine with others.  What if I miss a dose?  It is important not to miss your dose. Call your doctor or health care professional if you are unable to keep an appointment.  What may interact with this medicine?  Interactions have not been studied.  Give your health care provider a list of all the  medicines, herbs, non-prescription drugs, or dietary supplements you use. Also tell them if you smoke, drink alcohol, or use illegal drugs. Some items may interact with your medicine.  This list may not describe all possible interactions. Give your health care provider a list of all the medicines, herbs, non-prescription drugs, or dietary supplements you use. Also tell them if you smoke, drink alcohol, or use illegal drugs. Some items may interact with your medicine.  What should I watch for while using this medicine?  Your condition will be monitored carefully while you are receiving this medicine.  You may need blood work done while you are taking this medicine.  Do not become pregnant while taking this medicine or for 4 months after stopping it. Women should inform their doctor if they wish to become pregnant or think they might be pregnant. There is a potential for serious side effects to an unborn child. Talk to your health care professional or pharmacist for more information. Do not breast-feed an infant while taking this medicine or for 4 months after the last dose.  What side effects may I notice from receiving this medicine?  Side effects that you should report to your doctor or health care professional as soon as possible:  -allergic reactions like skin rash, itching or hives, swelling of the face, lips, or tongue  -bloody or black, tarry  -breathing problems  -changes in vision  -chest pain  -chills  -confusion  -constipation  -cough  -diarrhea  -dizziness or feeling faint or lightheaded  -  fast or irregular heartbeat  -fever  -flushing  -hair loss  -joint pain  -low blood counts - this medicine may decrease the number of white blood cells, red blood cells and platelets. You may be at increased risk for infections and bleeding.  -muscle pain  -muscle weakness  -persistent headache  -redness, blistering, peeling or loosening of the skin, including inside the mouth  -signs and symptoms of high blood sugar  such as dizziness; dry mouth; dry skin; fruity breath; nausea; stomach pain; increased hunger or thirst; increased urination  -signs and symptoms of kidney injury like trouble passing urine or change in the amount of urine  -signs and symptoms of liver injury like dark urine, light-colored stools, loss of appetite, nausea, right upper belly pain, yellowing of the eyes or skin  -sweating  -swollen lymph nodes  -weight loss  Side effects that usually do not require medical attention (report to your doctor or health care professional if they continue or are bothersome):  -decreased appetite  -muscle pain  -tiredness  This list may not describe all possible side effects. Call your doctor for medical advice about side effects. You may report side effects to FDA at 1-800-FDA-1088.  Where should I keep my medicine?  This drug is given in a hospital or clinic and will not be stored at home.  NOTE: This sheet is a summary. It may not cover all possible information. If you have questions about this medicine, talk to your doctor, pharmacist, or health care provider.   2019 Elsevier/Gold Standard (2018-04-21 15:06:10)

## 2018-11-04 ENCOUNTER — Telehealth: Payer: Self-pay | Admitting: Family

## 2018-11-04 ENCOUNTER — Ambulatory Visit: Payer: Medicare Other | Admitting: Cardiovascular Disease

## 2018-11-04 ENCOUNTER — Other Ambulatory Visit: Payer: Self-pay | Admitting: Family

## 2018-11-04 ENCOUNTER — Encounter: Payer: Self-pay | Admitting: Cardiovascular Disease

## 2018-11-04 VITALS — BP 180/100 | HR 75 | Ht 70.0 in | Wt 158.8 lb

## 2018-11-04 DIAGNOSIS — R0989 Other specified symptoms and signs involving the circulatory and respiratory systems: Secondary | ICD-10-CM

## 2018-11-04 DIAGNOSIS — E032 Hypothyroidism due to medicaments and other exogenous substances: Secondary | ICD-10-CM

## 2018-11-04 LAB — TSH: TSH: 11.833 u[IU]/mL — ABNORMAL HIGH (ref 0.320–4.118)

## 2018-11-04 LAB — CANCER ANTIGEN 27.29: CA 27.29: 54.4 U/mL — ABNORMAL HIGH (ref 0.0–38.6)

## 2018-11-04 MED ORDER — LEVOTHYROXINE SODIUM 50 MCG PO TABS: 50.0000 ug | ORAL_TABLET | Freq: Every day | ORAL | 3 refills | Status: DC

## 2018-11-04 NOTE — Patient Instructions (Signed)
Medication Instructions:   If you need a refill on your cardiac medications before your next appointment, please call your pharmacy.   Lab work:  If you have labs (blood work) drawn today and your tests are completely normal, you will receive your results only by: Marland Kitchen MyChart Message (if you have MyChart) OR . A paper copy in the mail If you have any lab test that is abnormal or we need to change your treatment, we will call you to review the results.  Testing/Procedures: Your physician has requested that you have a carotid duplex. This test is an ultrasound of the carotid arteries in your neck. It looks at blood flow through these arteries that supply the brain with blood. Allow one hour for this exam. There are no restrictions or special instructions.  Follow-Up: At [facility], you and your health needs are our priority.  As part of our continuing mission to provide you with exceptional heart care, we have created designated Provider Care Teams.  These Care Teams include your primary Cardiologist (physician) and Advanced Practice Providers (APPs -  Physician Assistants and Nurse Practitioners) who all work together to provide you with the care you need, when you need it. You will need a follow up appointment in 6 months.  Please call our office 2 months in advance to schedule this appointment.  You may see Dr. Johnsie Cancel or one of the following Advanced Practice Providers on your designated Care Team:   Truitt Merle, NP Cecilie Kicks, NP . Kathyrn Drown, NP

## 2018-11-04 NOTE — Telephone Encounter (Signed)
Mr. [name] [name]'s TSH is up to 11. I have ordered Synthroid 50 mcg PO daily per Dr. Marin Olp. Patient did not answer so I left a message on his personal voicemail with call back number for details.

## 2018-11-05 DIAGNOSIS — I10 Essential (primary) hypertension: Secondary | ICD-10-CM | POA: Diagnosis not present

## 2018-11-05 DIAGNOSIS — I440 Atrioventricular block, first degree: Secondary | ICD-10-CM | POA: Diagnosis not present

## 2018-11-05 DIAGNOSIS — R000 Tachycardia, unspecified: Secondary | ICD-10-CM | POA: Diagnosis not present

## 2018-11-05 DIAGNOSIS — E119 Type 2 diabetes mellitus without complications: Secondary | ICD-10-CM | POA: Diagnosis not present

## 2018-11-05 DIAGNOSIS — Z7982 Long term (current) use of aspirin: Secondary | ICD-10-CM | POA: Diagnosis not present

## 2018-11-05 DIAGNOSIS — I4510 Unspecified right bundle-branch block: Secondary | ICD-10-CM | POA: Diagnosis not present

## 2018-11-05 DIAGNOSIS — I110 Hypertensive heart disease with heart failure: Secondary | ICD-10-CM | POA: Diagnosis not present

## 2018-11-05 DIAGNOSIS — Z85038 Personal history of other malignant neoplasm of large intestine: Secondary | ICD-10-CM | POA: Diagnosis not present

## 2018-11-05 DIAGNOSIS — Z7984 Long term (current) use of oral hypoglycemic drugs: Secondary | ICD-10-CM | POA: Diagnosis not present

## 2018-11-05 DIAGNOSIS — I471 Supraventricular tachycardia: Secondary | ICD-10-CM | POA: Diagnosis not present

## 2018-11-05 DIAGNOSIS — J449 Chronic obstructive pulmonary disease, unspecified: Secondary | ICD-10-CM | POA: Diagnosis not present

## 2018-11-05 DIAGNOSIS — Z79899 Other long term (current) drug therapy: Secondary | ICD-10-CM | POA: Diagnosis not present

## 2018-11-05 DIAGNOSIS — R002 Palpitations: Secondary | ICD-10-CM | POA: Diagnosis not present

## 2018-11-07 ENCOUNTER — Telehealth: Payer: Self-pay | Admitting: Cardiovascular Disease

## 2018-11-07 DIAGNOSIS — I471 Supraventricular tachycardia: Secondary | ICD-10-CM

## 2018-11-07 NOTE — Telephone Encounter (Signed)
Medical records requested from [facility].

## 2018-11-07 NOTE — Telephone Encounter (Signed)
Patient stated we need to call [facility] in [place] and get records for his ER visit. Please call [phone] and their fax number is [phone]. Will route to medical records.

## 2018-11-07 NOTE — Telephone Encounter (Signed)
New Message   Patient states Dr. Johnsie Cancel recommended for him to have an ablation done and he wants to speak to the nurse about setting it up if the doctor still recommends him to have it done.

## 2018-11-07 NOTE — Telephone Encounter (Signed)
STAT if HR is under 50 or over 120 (normal HR is 60-100 beats per minute)  1) What is your heart rate? 105  2) Do you have a log of your heart rate readings (document readings)? no  3) Do you have any other symptoms? He had his HR go up about two days ago Saturday afternoon, he called the ambulance they gave him fluids and he was release from the ED since his HR dropped.  But he was told by Dr. Johnsie Cancel that if ever happened that he should call so he can set up a surgery.  He states that it happens about twice a year.

## 2018-11-07 NOTE — Telephone Encounter (Signed)
Called patient about his message. Patient was referring to an ablation. Patient stated he feels fine now, but if he is going to have any procedure he would like to get it done before his busy season. Will see if Dr. Johnsie Cancel wants to refer patient to EP doctor.

## 2018-11-07 NOTE — Telephone Encounter (Signed)
Called patient with Dr. Kyla Balzarine recommendations. Will send message to EP scheduler to help get patient an appointment. Asked patient for ER records. Patient stated he would get records fax to our office.

## 2018-11-07 NOTE — Telephone Encounter (Signed)
Can refer to EP for SVT ? Get records from ER he went to does not look like [facility]

## 2018-11-07 NOTE — Telephone Encounter (Signed)
Medical records received from [facility].

## 2018-11-10 ENCOUNTER — Other Ambulatory Visit: Payer: Self-pay | Admitting: Hematology & Oncology

## 2018-11-11 ENCOUNTER — Telehealth: Payer: Self-pay

## 2018-11-11 NOTE — Telephone Encounter (Signed)
NOTES ON FILE 

## 2018-11-14 ENCOUNTER — Encounter: Payer: Self-pay | Admitting: *Deleted

## 2018-11-14 ENCOUNTER — Ambulatory Visit (HOSPITAL_COMMUNITY)
Admission: RE | Admit: 2018-11-14 | Discharge: 2018-11-14 | Disposition: A | Discharge status: Home / Self Care | Payer: Medicare Other | Source: Ambulatory Visit | Attending: Cardiovascular Disease | Admitting: Cardiovascular Disease

## 2018-11-14 DIAGNOSIS — R0989 Other specified symptoms and signs involving the circulatory and respiratory systems: Secondary | ICD-10-CM | POA: Diagnosis not present

## 2018-11-16 MED FILL — INLYTA 5 MG TABLET: 5 | 28 days supply | Qty: 42 | Fill #0

## 2018-11-18 ENCOUNTER — Encounter (HOSPITAL_COMMUNITY): Payer: Medicare Other

## 2018-11-18 DIAGNOSIS — M7918 Myalgia, other site: Secondary | ICD-10-CM | POA: Diagnosis not present

## 2018-11-18 DIAGNOSIS — M546 Pain in thoracic spine: Secondary | ICD-10-CM | POA: Diagnosis not present

## 2018-11-18 DIAGNOSIS — M791 Myalgia, unspecified site: Secondary | ICD-10-CM | POA: Diagnosis not present

## 2018-11-18 DIAGNOSIS — M456 Ankylosing spondylitis lumbar region: Secondary | ICD-10-CM | POA: Diagnosis not present

## 2018-11-21 ENCOUNTER — Other Ambulatory Visit: Payer: Self-pay | Admitting: Family Medicine

## 2018-11-24 ENCOUNTER — Inpatient Hospital Stay: Payer: Medicare Other | Attending: Hematology & Oncology | Admitting: Family

## 2018-11-24 ENCOUNTER — Inpatient Hospital Stay: Payer: Medicare Other

## 2018-11-24 ENCOUNTER — Other Ambulatory Visit: Payer: Self-pay

## 2018-11-24 ENCOUNTER — Telehealth: Payer: Self-pay | Admitting: Family

## 2018-11-24 ENCOUNTER — Encounter: Payer: Self-pay | Admitting: Family

## 2018-11-24 VITALS — BP 180/76 | Temp 98.7°F | Resp 18

## 2018-11-24 VITALS — BP 209/68 | HR 51 | Temp 97.6°F | Resp 19 | Ht 70.0 in | Wt 155.2 lb

## 2018-11-24 DIAGNOSIS — Z86006 Personal history of melanoma in-situ: Secondary | ICD-10-CM | POA: Diagnosis not present

## 2018-11-24 DIAGNOSIS — Z8546 Personal history of malignant neoplasm of prostate: Secondary | ICD-10-CM | POA: Diagnosis not present

## 2018-11-24 DIAGNOSIS — C641 Malignant neoplasm of right kidney, except renal pelvis: Secondary | ICD-10-CM

## 2018-11-24 DIAGNOSIS — M79662 Pain in left lower leg: Secondary | ICD-10-CM | POA: Insufficient documentation

## 2018-11-24 DIAGNOSIS — M79605 Pain in left leg: Secondary | ICD-10-CM | POA: Diagnosis not present

## 2018-11-24 DIAGNOSIS — Z923 Personal history of irradiation: Secondary | ICD-10-CM | POA: Insufficient documentation

## 2018-11-24 DIAGNOSIS — C7951 Secondary malignant neoplasm of bone: Secondary | ICD-10-CM | POA: Diagnosis not present

## 2018-11-24 DIAGNOSIS — M4850XS Collapsed vertebra, not elsewhere classified, site unspecified, sequela of fracture: Secondary | ICD-10-CM

## 2018-11-24 DIAGNOSIS — E032 Hypothyroidism due to medicaments and other exogenous substances: Secondary | ICD-10-CM

## 2018-11-24 DIAGNOSIS — Z79899 Other long term (current) drug therapy: Secondary | ICD-10-CM | POA: Diagnosis not present

## 2018-11-24 DIAGNOSIS — Z5112 Encounter for antineoplastic immunotherapy: Secondary | ICD-10-CM | POA: Diagnosis not present

## 2018-11-24 DIAGNOSIS — M8458XA Pathological fracture in neoplastic disease, other specified site, initial encounter for fracture: Secondary | ICD-10-CM

## 2018-11-24 LAB — CBC WITH DIFFERENTIAL (CANCER CENTER ONLY)
ABS IMMATURE GRANULOCYTES: 0.03 10*3/uL (ref 0.00–0.07)
Basophils Absolute: 0.1 10*3/uL (ref 0.0–0.1)
Basophils Relative: 1 %
Eosinophils Absolute: 0.1 10*3/uL (ref 0.0–0.5)
Eosinophils Relative: 2 %
HCT: 45.4 % (ref 39.0–52.0)
Hemoglobin: 14.5 g/dL (ref 13.0–17.0)
Immature Granulocytes: 0 %
Lymphocytes Relative: 23 %
Lymphs Abs: 1.9 10*3/uL (ref 0.7–4.0)
MCH: 28.9 pg (ref 26.0–34.0)
MCHC: 31.9 g/dL (ref 30.0–36.0)
MCV: 90.6 fL (ref 80.0–100.0)
Monocytes Absolute: 0.5 10*3/uL (ref 0.1–1.0)
Monocytes Relative: 6 %
NRBC: 0 % (ref 0.0–0.2)
Neutro Abs: 5.8 10*3/uL (ref 1.7–7.7)
Neutrophils Relative %: 68 %
Platelet Count: 222 10*3/uL (ref 150–400)
RBC: 5.01 MIL/uL (ref 4.22–5.81)
RDW: 14.6 % (ref 11.5–15.5)
WBC: 8.5 10*3/uL (ref 4.0–10.5)

## 2018-11-24 LAB — CMP (CANCER CENTER ONLY)
ALT: 17 U/L (ref 0–44)
AST: 17 U/L (ref 15–41)
Albumin: 4 g/dL (ref 3.5–5.0)
Alkaline Phosphatase: 97 U/L (ref 38–126)
Anion gap: 7 (ref 5–15)
BUN: 28 mg/dL — ABNORMAL HIGH (ref 8–23)
CALCIUM: 9.8 mg/dL (ref 8.9–10.3)
CO2: 31 mmol/L (ref 22–32)
CREATININE: 1.07 mg/dL (ref 0.61–1.24)
Chloride: 101 mmol/L (ref 98–111)
GFR, Est AFR Am: >60 mL/min (ref >60)
GFR, Estimated: >60 mL/min (ref >60)
Glucose, Bld: 124 mg/dL — ABNORMAL HIGH (ref 70–99)
Potassium: 4.4 mmol/L (ref 3.5–5.1)
Sodium: 139 mmol/L (ref 135–145)
Total Bilirubin: 0.5 mg/dL (ref 0.3–1.2)
Total Protein: 7 g/dL (ref 6.5–8.1)

## 2018-11-24 LAB — LACTATE DEHYDROGENASE: LDH: 182 U/L (ref 98–192)

## 2018-11-24 MED ORDER — SODIUM CHLORIDE 0.9 % IV SOLN
200.0000 mg | Freq: Once | INTRAVENOUS | Status: AC
  Administered 2018-11-24: 200 mg via INTRAVENOUS
  Filled 2018-11-24: qty 8

## 2018-11-24 MED ORDER — SODIUM CHLORIDE 0.9 % IV SOLN
Freq: Once | INTRAVENOUS | Status: AC
  Administered 2018-11-24: 12:00:00 via INTRAVENOUS
  Filled 2018-11-24: qty 250

## 2018-11-24 MED ORDER — OXYCODONE ER 9 MG PO C12A: 1.0000 | EXTENDED_RELEASE_CAPSULE | Freq: Two times a day (BID) | ORAL | 0 refills | Status: DC

## 2018-11-24 MED ORDER — OXYCODONE ER 13.5 MG PO C12A: 13.5000 mg | EXTENDED_RELEASE_CAPSULE | Freq: Two times a day (BID) | ORAL | 0 refills | Status: DC

## 2018-11-24 NOTE — Patient Instructions (Signed)
Pembrolizumab injection  What is this medicine?  PEMBROLIZUMAB (pem broe liz ue mab) is a monoclonal antibody. It is used to treat cervical cancer, esophageal cancer, head and neck cancer, hepatocellular cancer, Hodgkin lymphoma, kidney cancer, lymphoma, melanoma, Merkel cell carcinoma, lung cancer, stomach cancer, urothelial cancer, and cancers that have a certain genetic condition.  This medicine may be used for other purposes; ask your health care provider or pharmacist if you have questions.  COMMON BRAND NAME(S): Keytruda  What should I tell my health care provider before I take this medicine?  They need to know if you have any of these conditions:  -diabetes  -immune system problems  -inflammatory bowel disease  -liver disease  -lung or breathing disease  -lupus  -received or scheduled to receive an organ transplant or a stem-cell transplant that uses donor stem cells  -an unusual or allergic reaction to pembrolizumab, other medicines, foods, dyes, or preservatives  -pregnant or trying to get pregnant  -breast-feeding  How should I use this medicine?  This medicine is for infusion into a vein. It is given by a health care professional in a hospital or clinic setting.  A special MedGuide will be given to you before each treatment. Be sure to read this information carefully each time.  Talk to your pediatrician regarding the use of this medicine in children. While this drug may be prescribed for selected conditions, precautions do apply.  Overdosage: If you think you have taken too much of this medicine contact a poison control center or emergency room at once.  NOTE: This medicine is only for you. Do not share this medicine with others.  What if I miss a dose?  It is important not to miss your dose. Call your doctor or health care professional if you are unable to keep an appointment.  What may interact with this medicine?  Interactions have not been studied.  Give your health care provider a list of all the  medicines, herbs, non-prescription drugs, or dietary supplements you use. Also tell them if you smoke, drink alcohol, or use illegal drugs. Some items may interact with your medicine.  This list may not describe all possible interactions. Give your health care provider a list of all the medicines, herbs, non-prescription drugs, or dietary supplements you use. Also tell them if you smoke, drink alcohol, or use illegal drugs. Some items may interact with your medicine.  What should I watch for while using this medicine?  Your condition will be monitored carefully while you are receiving this medicine.  You may need blood work done while you are taking this medicine.  Do not become pregnant while taking this medicine or for 4 months after stopping it. Women should inform their doctor if they wish to become pregnant or think they might be pregnant. There is a potential for serious side effects to an unborn child. Talk to your health care professional or pharmacist for more information. Do not breast-feed an infant while taking this medicine or for 4 months after the last dose.  What side effects may I notice from receiving this medicine?  Side effects that you should report to your doctor or health care professional as soon as possible:  -allergic reactions like skin rash, itching or hives, swelling of the face, lips, or tongue  -bloody or black, tarry  -breathing problems  -changes in vision  -chest pain  -chills  -confusion  -constipation  -cough  -diarrhea  -dizziness or feeling faint or lightheaded  -  fast or irregular heartbeat  -fever  -flushing  -hair loss  -joint pain  -low blood counts - this medicine may decrease the number of white blood cells, red blood cells and platelets. You may be at increased risk for infections and bleeding.  -muscle pain  -muscle weakness  -persistent headache  -redness, blistering, peeling or loosening of the skin, including inside the mouth  -signs and symptoms of high blood sugar  such as dizziness; dry mouth; dry skin; fruity breath; nausea; stomach pain; increased hunger or thirst; increased urination  -signs and symptoms of kidney injury like trouble passing urine or change in the amount of urine  -signs and symptoms of liver injury like dark urine, light-colored stools, loss of appetite, nausea, right upper belly pain, yellowing of the eyes or skin  -sweating  -swollen lymph nodes  -weight loss  Side effects that usually do not require medical attention (report to your doctor or health care professional if they continue or are bothersome):  -decreased appetite  -muscle pain  -tiredness  This list may not describe all possible side effects. Call your doctor for medical advice about side effects. You may report side effects to FDA at 1-800-FDA-1088.  Where should I keep my medicine?  This drug is given in a hospital or clinic and will not be stored at home.  NOTE: This sheet is a summary. It may not cover all possible information. If you have questions about this medicine, talk to your doctor, pharmacist, or health care provider.   2019 Elsevier/Gold Standard (2018-04-21 15:06:10)

## 2018-11-24 NOTE — Telephone Encounter (Signed)
Already has follow-up on 3/26 with MD per 3/5 los

## 2018-11-24 NOTE — Progress Notes (Signed)
OK to treat with elevated BP per Dr. Marin Olp.

## 2018-11-24 NOTE — Progress Notes (Signed)
Hematology and Oncology Follow Up Visit  [name] [name] [id number] 1941/08/02 78 y.o. 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 He is doing well and staying busy with work. He states that the summer time is his busiest season.  He is still having left lower extremity discomfort and plans to walk more for exercise. He does feel that his current pain regimen is not as effective as it should be.  He recently received a steroid injection in the right shoulder last week.  He states that he is doing well on Inlyta and is taking as prescribed.  He states that he has history of SVT and may require an ablation. He is followed closely by cardiologist Dr. Edmonia James.  No fever, chills, n/v, cough, dizziness, SOB, chest pain, palpitations, abdominal pain or changes in bowel or bladder habits.  He sometimes has itching on his back and arms from dry skin and will try using a good lotion such as gold bond or Eucerin.  No swelling, numbness or tingling in his extremities at this time.  No lymphadenopathy noted on exam.  He has had no episodes of bleeding, no bruising or petechiae.  He has maintained a good appetite and is drinking a Boost once a day. He is staying hydrated. His weight is down 2 lbs since his last visit.   ECOG Performance Status: 1 - Symptomatic but completely ambulatory  Medications:  Allergies as of 11/24/2018   No Known Allergies     Medication List       Accurate as of November 24, 2018 11:19 AM. Always use your most recent med list.        amLODipine 10 MG tablet Commonly known as:  NORVASC TAKE 1 TABLET(10 MG) BY MOUTH DAILY   ANORO ELLIPTA 62.5-25  MCG/INH Aepb Generic drug:  umeclidinium-vilanterol INHALE 1 PUFF BY MOUTH EVERY DAY   aspirin EC 81 MG tablet Take 1 tablet (81 mg total) by mouth daily.   clopidogrel 75 MG tablet Commonly known as:  PLAVIX TAKE 1 TABLET BY MOUTH ONCE DAILY   CONTOUR BLOOD GLUCOSE SYSTEM Devi Diagnoses - Diabetes E 11.8 Check blood sugar once daily   doxazosin 4 MG tablet Commonly known as:  CARDURA Take 4 mg by mouth.   gabapentin 300 MG capsule Commonly known as:  NEURONTIN One tab PO qHS for a week, then BID for a week, then TID. May double weekly to a max of 3,648m/day   glucose blood test strip Commonly known as:  BAYER CONTOUR TEST Diagnoses - Diabetes E 11.8 Check blood sugar once daily   INLYTA 5 MG tablet Generic drug:  axitinib TAKE 1 TABLET (5MG) BY MOUTH TWICE DAILY FOR 21 DAYS ON, THEN 7 DAYS OFF.   levothyroxine 50 MCG tablet Commonly known as:  SYNTHROID Take 1 tablet (50 mcg total) by mouth daily before breakfast.   losartan 100 MG tablet Commonly known as:  COZAAR TAKE 1 TABLET BY MOUTH ONCE DAILY   metFORMIN 500 MG 24 hr tablet Commonly known as:  GLUCOPHAGE-XR TAKE 2 TABLETS BY MOUTH EVERY DAY WITH BREAKFAST   metoprolol succinate 100 MG 24  hr tablet Commonly known as:  TOPROL-XL TAKE 1 TABLET BY MOUTH EVERY DAY   multivitamin with minerals tablet Take 1 tablet by mouth daily.   oxyCODONE ER 9 MG C12a Commonly known as:  XTAMPZA ER Take 1 tablet by mouth every 12 (twelve) hours.   PROAIR HFA 108 (90 Base) MCG/ACT inhaler Generic drug:  albuterol INHALE 2 PUFFS INTO THE LUNGS EVERY 6 HOURS AS NEEDED FOR WHEEZING   prochlorperazine 10 MG tablet Commonly known as:  COMPAZINE TAKE 1 TABLET(10 MG) BY MOUTH EVERY 6 HOURS AS NEEDED FOR NAUSEA OR VOMITING       Allergies: No Known Allergies  Past Medical History, Surgical history, Social history, and Family History were reviewed and updated.  Review of Systems: All other 10 point review of systems is  negative.   Physical Exam:  vitals were not taken for this visit.   Wt Readings from Last 3 Encounters:  11/04/18 158 lb 12.8 oz (72 kg)  11/03/18 157 lb 8 oz (71.4 kg)  10/21/18 162 lb (73.5 kg)    Ocular: Sclerae unicteric, pupils equal, round and reactive to light Ear-nose-throat: Oropharynx clear, dentition fair Lymphatic: No cervical, supraclavicular or axillary adenopathy Lungs no rales or rhonchi, good excursion bilaterally Heart regular rate and rhythm, no murmur appreciated Abd soft, nontender, positive bowel sounds, no liver or spleen tip palpated on exam, no fluid wave  MSK no focal spinal tenderness, no joint edema Neuro: non-focal, well-oriented, appropriate affect Breasts: Deferred   Lab Results  Component Value Date   WBC 8.5 11/24/2018   HGB 14.5 11/24/2018   HCT 45.4 11/24/2018   MCV 90.6 11/24/2018   PLT 222 11/24/2018   Lab Results  Component Value Date   FERRITIN 95 10/01/2017   Lab Results  Component Value Date   RBC 5.01 11/24/2018   Lab Results  Component Value Date   KPAFRELGTCHN 28.2 (H) 05/05/2018   LAMBDASER 24.6 05/05/2018   KAPLAMBRATIO 5.75 05/09/2018   Lab Results  Component Value Date   IGGSERUM 799 05/05/2018   IGA 229 05/05/2018   IGMSERUM 136 05/05/2018   Lab Results  Component Value Date   TOTALPROTELP 6.6 05/05/2018   ALBUMINELP 3.4 05/05/2018   A1GS 0.3 05/05/2018   A2GS 1.1 (H) 05/05/2018   BETS 0.9 05/05/2018   BETA2SER 0.4 04/29/2018   GAMS 0.8 05/05/2018   MSPIKE Not Observed 05/05/2018   SPEI  04/29/2018     Comment:     . Evaluation is consistent with an acute inflammatory  pattern. . . A faint abnormal protein band is detected in the gamma globulins that most likely represents CRP, or circulating immune complexes, rather than a monoclonal immunoglobulin. . . Immunofixation analysis is available if identification of the band(s) is clinically indicated. .      Chemistry      Component Value  Date/Time   NA 138 11/03/2018 1123   K 3.9 11/03/2018 1123   CL 101 11/03/2018 1123   CO2 28 11/03/2018 1123   BUN 15 11/03/2018 1123   CREATININE 1.03 11/03/2018 1123   CREATININE 1.11 04/29/2018 1047      Component Value Date/Time   CALCIUM 9.8 11/03/2018 1123   ALKPHOS 122 11/03/2018 1123   AST 19 11/03/2018 1123   ALT 20 11/03/2018 1123   BILITOT 0.6 11/03/2018 1123      Impression and Plan: Mr. Gomillion is a very pleasant 78 yo caucasian gentleman with metastatic kidney cancer. He also has history of prostate  cancer and melanoma in situ.  He is tolerating treatment nicely and has had no issues since starting Inlyta.  He is still having the left lower extremity discomfort. I spoke with Dr. Marin Olp and we will increase his Xtampza to 13.5 mg PO Q 12 hours.  We will proceed with treatment today as planned. We will repeat scans after cycle 8.  He has an MD follow-up in 3 weeks.  He will contact our office with any questions or concerns. We can certainly see him sooner if need be.   Laverna Peace, NP 3/5/202011:19 AM

## 2018-11-25 ENCOUNTER — Ambulatory Visit: Payer: Medicare Other | Admitting: Cardiology

## 2018-11-25 ENCOUNTER — Encounter: Payer: Self-pay | Admitting: Cardiology

## 2018-11-25 VITALS — BP 168/86 | HR 49 | Ht 70.0 in | Wt 155.4 lb

## 2018-11-25 DIAGNOSIS — I471 Supraventricular tachycardia: Secondary | ICD-10-CM | POA: Diagnosis not present

## 2018-11-25 DIAGNOSIS — Z01812 Encounter for preprocedural laboratory examination: Secondary | ICD-10-CM

## 2018-11-25 LAB — BASIC METABOLIC PANEL
BUN/Creatinine Ratio: 19 (ref 10–24)
BUN: 20 mg/dL (ref 8–27)
CO2: 24 mmol/L (ref 20–29)
Calcium: 9.4 mg/dL (ref 8.6–10.2)
Chloride: 100 mmol/L (ref 96–106)
Creatinine, Ser: 1.08 mg/dL (ref 0.76–1.27)
GFR calc non Af Amer: 66 mL/min/{1.73_m2} (ref >59)
GFR, EST AFRICAN AMERICAN: 76 mL/min/{1.73_m2} (ref >59)
Glucose: 101 mg/dL — ABNORMAL HIGH (ref 65–99)
Potassium: 4.3 mmol/L (ref 3.5–5.2)
Sodium: 140 mmol/L (ref 134–144)

## 2018-11-25 LAB — CBC
HEMOGLOBIN: 15 g/dL (ref 13.0–17.7)
Hematocrit: 45.5 % (ref 37.5–51.0)
MCH: 28.7 pg (ref 26.6–33.0)
MCHC: 33 g/dL (ref 31.5–35.7)
MCV: 87 fL (ref 79–97)
Platelets: 274 10*3/uL (ref 150–450)
RBC: 5.22 x10E6/uL (ref 4.14–5.80)
RDW: 14.4 % (ref 11.6–15.4)
WBC: 9.5 10*3/uL (ref 3.4–10.8)

## 2018-11-25 NOTE — Progress Notes (Signed)
Electrophysiology Office Note   Date:  11/25/2018   ID:  [name] [name], DOB 24-Apr-1941, MRN [id number]  PCP:  Hali Marry, MD  Cardiologist:  [name] [name] Primary Electrophysiologist:  [name] Railey Meredith Leeds, MD    No chief complaint on file.    History of Present Illness: [name] [name] is a 78 y.o. male who is being seen today for the evaluation of SVT at the request of [name] Hector, MD. Presenting today for electrophysiology evaluation.  Has a history of coronary artery disease, hypertension, hyperlipidemia.  He has a history of SVT.  He was seen at [facility] 11/19/2016 with SVT which converted with adenosine.  He states that he has an episode of SVT a few times a year.  Usually it terminates with vagal maneuvers, but he has required adenosine in the past.  His symptoms are weakness, fatigue, and palpitations.  There are no exacerbating factors.    Today, he denies symptoms of palpitations, chest pain, shortness of breath, orthopnea, PND, lower extremity edema, claudication, dizziness, presyncope, syncope, bleeding, or neurologic sequela. The patient is tolerating medications without difficulties.    Past Medical History:  Diagnosis Date  . Allergic rhinitis   . Bigeminy   . Bradycardia   . Bruit   . CAD (coronary artery disease)   . Claudication ([place])   . Diabetes mellitus type II   . Dyspnea   . Goals of care, counseling/discussion 06/02/2018  . History of prostate cancer    [name] [name]  . Hypercholesteremia    II A  . Hypertension   . Kidney cancer, primary, with metastasis from kidney to other site, right ([place]) 06/02/2018  . Malignant neoplasm of prostate ([place])   . Microalbuminuria   . Seborrheic keratosis    Past Surgical History:  Procedure Laterality Date  . CORONARY ANGIOPLASTY WITH STENT PLACEMENT    . IR FLUORO RM 30-60 MIN  05/24/2018     Current Outpatient Medications  Medication Sig Dispense Refill  . amLODipine (NORVASC) 10 MG tablet TAKE 1 TABLET(10  MG) BY MOUTH DAILY 90 tablet 0  . ANORO ELLIPTA 62.5-25 MCG/INH AEPB INHALE 1 PUFF BY MOUTH EVERY DAY 60 each 3  . aspirin EC 81 MG tablet Take 1 tablet (81 mg total) by mouth daily.    . Blood Glucose Monitoring Suppl (CONTOUR BLOOD GLUCOSE SYSTEM) DEVI Diagnoses - Diabetes E 11.8 Check blood sugar once daily 1 Device 0  . clopidogrel (PLAVIX) 75 MG tablet TAKE 1 TABLET BY MOUTH ONCE DAILY 90 tablet 3  . doxazosin (CARDURA) 4 MG tablet Take 4 mg by mouth.    . gabapentin (NEURONTIN) 300 MG capsule One tab PO qHS for a week, then BID for a week, then TID. May double weekly to a max of 3,600mg /day 180 capsule 3  . glucose blood (BAYER CONTOUR TEST) test strip Diagnoses - Diabetes E 11.8 Check blood sugar once daily 100 each prn  . INLYTA 5 MG tablet TAKE 1 TABLET (5MG ) BY MOUTH TWICE DAILY FOR 21 DAYS ON, THEN 7 DAYS OFF. 42 tablet 3  . levothyroxine (SYNTHROID) 50 MCG tablet Take 1 tablet (50 mcg total) by mouth daily before breakfast. 30 tablet 3  . losartan (COZAAR) 100 MG tablet TAKE 1 TABLET BY MOUTH ONCE DAILY 90 tablet 3  . metFORMIN (GLUCOPHAGE-XR) 500 MG 24 hr tablet TAKE 2 TABLETS BY MOUTH EVERY DAY WITH BREAKFAST 180 tablet 1  . metoprolol succinate (TOPROL-XL) 100 MG 24 hr tablet TAKE 1  TABLET BY MOUTH EVERY DAY 90 tablet 1  . Multiple Vitamins-Minerals (MULTIVITAMIN WITH MINERALS) tablet Take 1 tablet by mouth daily.    Marland Kitchen oxyCODONE ER (XTAMPZA ER) 13.5 MG C12A Take 13.5 mg by mouth every 12 (twelve) hours. 60 each 0  . PROAIR HFA 108 (90 Base) MCG/ACT inhaler INHALE 2 PUFFS INTO THE LUNGS EVERY 6 HOURS AS NEEDED FOR WHEEZING 8.5 g 2  . prochlorperazine (COMPAZINE) 10 MG tablet TAKE 1 TABLET(10 MG) BY MOUTH EVERY 6 HOURS AS NEEDED FOR NAUSEA OR VOMITING 385 tablet 1   No current facility-administered medications for this visit.     Allergies:   Patient has no known allergies.   Social History:  The patient  reports that he quit smoking about 15 years ago. He has a 20.00 pack-year  smoking history. He has never used smokeless tobacco. He reports previous alcohol use of about 1.0 standard drinks of alcohol per week. He reports that he does not use drugs.   Family History:  The patient's family history includes Coronary artery disease in his brother; Heart attack in his father.    ROS:  Please see the history of present illness.   Otherwise, review of systems is positive for none.   All other systems are reviewed and negative.    PHYSICAL EXAM: VS:  BP (!) 168/86   Pulse (!) 49   Ht 5\' 10"  (1.778 m)   Wt 155 lb 6.4 oz (70.5 kg)   SpO2 99%   BMI 22.30 kg/m  , BMI Body mass index is 22.3 kg/m. GEN: Well nourished, well developed, in no acute distress  HEENT: normal  Neck: no JVD, carotid bruits, or masses Cardiac: RRR; no murmurs, rubs, or gallops,no edema  Respiratory:  clear to auscultation bilaterally, normal work of breathing GI: soft, nontender, nondistended, + BS MS: no deformity or atrophy  Skin: warm and dry Neuro:  Strength and sensation are intact Psych: euthymic mood, full affect  EKG:  EKG is ordered today. Personal review of the ekg ordered shows this rhythm, right bundle branch block, rate 49  Recent Labs: 11/03/2018: TSH 11.833 11/24/2018: ALT 17; BUN 28; Creatinine 1.07; Hemoglobin 14.5; Platelet Count 222; Potassium 4.4; Sodium 139    Lipid Panel     Component Value Date/Time   CHOL 89 04/14/2017 0733   TRIG 94 04/14/2017 0733   HDL 28 (L) 04/14/2017 0733   CHOLHDL 3.2 04/14/2017 0733   VLDL 31 (H) 09/09/2015 1218   LDLCALC 69 09/09/2015 1218     Wt Readings from Last 3 Encounters:  11/25/18 155 lb 6.4 oz (70.5 kg)  11/24/18 155 lb 4 oz (70.4 kg)  11/04/18 158 lb 12.8 oz (72 kg)      Other studies Reviewed: Additional studies/ records that were reviewed today include: epic notes  ASSESSMENT AND PLAN:  1.  SVT: Currently on beta-blockers.  He presented to the emergency room 11/05/2018 with an episode of SVT.  Multiple episodes  requiring adenosine.  At this point, he would prefer to have ablation performed.  Unfortunately his episodes of broken while in the ambulance and I do not have ECGs.  I discussed with him risks and benefits.  Risks include bleeding, tamponade, heart block, stroke, among others.  He understands risks and is agreed to the procedure.  2.  Coronary artery disease: Status post OM stent in 1996.  Plan per primary cardiology.  3.  Hyperlipidemia: Plan per primary physician and primary cardiology.  4.  Hypertension:  Blood pressure significant elevated today.  [name] [name] discuss this with his primary cardiologist.    Current medicines are reviewed at length with the patient today.   The patient does not have concerns regarding his medicines.  The following changes were made today:  none  Labs/ tests ordered today include:  Orders Placed This Encounter  Procedures  . Basic metabolic panel  . CBC  . EKG 12-Lead     Disposition:   FU with [name] [name] 3 months  Signed, [name] Marmol Meredith Leeds, MD  11/25/2018 10:44 AM     [facility] [address] [place] [address] [phone] (office) [phone] (fax)

## 2018-11-25 NOTE — Addendum Note (Signed)
Addended by: Eulis Foster on: 11/25/2018 10:55 AM   Modules accepted: Orders

## 2018-11-25 NOTE — Patient Instructions (Addendum)
Medication Instructions:  Your physician recommends that you continue on your current medications as directed. Please refer to the Current Medication list given to you today.  * If you need a refill on your cardiac medications before your next appointment, please call your pharmacy.   Labwork: Pre procedure labs today: BMET & CBC *We will only notify you of abnormal results, otherwise continue current treatment plan.  Testing/Procedures: Your physician has recommended that you have an ablation. Catheter ablation is a medical procedure used to treat some cardiac arrhythmias (irregular heartbeats). During catheter ablation, a long, thin, flexible tube is put into a blood vessel in your groin (upper thigh), or neck. This tube is called an ablation catheter. It is then guided to your heart through the blood vessel. Radio frequency waves destroy small areas of heart tissue where abnormal heartbeats may cause an arrhythmia to start. Please see the instruction sheet given to you today. Instructions for your ablation: 1. Please arrive at the [facility], Main Entrance "A", of [facility] at 10:30 a.m. on 12/23/18. 2. Do not eat or drink after midnight the night prior to the procedure. 3. Do not take any medications the morning of the procedure. 4. Both of your groins will need to be shaved for this procedure (if needed). We ask that you do this yourself at home 1-2 days prior to the procedure.  If you are unable/uncomfortable to do yourself, the hospital staff will shave you the day of your procedure (if needed). 5. Plan for an overnight stay in the hospital. 6. You will need someone to drive you home at discharge.   Follow-Up: Your physician recommends that you schedule a follow-up appointment in: 4 weeks, after your ablation on 12/23/18, with Dr. Curt Bears.  *Please note that any paperwork needing to be filled out by the provider will need to be addressed at the front desk prior to seeing the  provider. Please note that any FMLA, disability or other documents regarding health condition is subject to a $25.00 charge that must be received prior to completion of paperwork in the form of a money order or check.  Thank you for choosing [facility]!!   Trinidad Curet, RN [phone]  Any Other Special Instructions Will Be Listed Below (If Applicable).    Cardiac Ablation  Cardiac ablation is a procedure to stop some heart tissue from causing problems. The heart has many electrical connections. Sometimes these connections make the heart beat very fast or irregularly. Removing some problem areas can improve the heart rhythm or make it normal. What happens before the procedure?  Follow instructions from your doctor about what you cannot eat or drink.  Ask your doctor about: ? Changing or stopping your normal medicines. This is important if you take diabetes medicines or blood thinners. ? Taking medicines such as aspirin and ibuprofen. These medicines can thin your blood. Do not take these medicines before your procedure if your doctor tells you not to.  Plan to have someone take you home.  If you will be going home right after the procedure, plan to have someone with you for 24 hours. What happens during the procedure?  To lower your risk of infection: ? Your health care team will wash or sanitize their hands. ? Your skin will be washed with soap. ? Hair may be removed from your neck or groin.  An IV tube will be put into one of your veins.  You will be given a medicine to help  you relax (sedative).  Skin on your neck or groin will be numbed.  A cut (incision) will be made in your neck or groin.  A needle will be put through your cut and into a vein in your neck or groin.  A tube (catheter) will be put into the needle. The tube will be moved to your heart. X-rays (fluoroscopy) will be used to help guide the tube.  Small devices (electrodes) on the tip of the tube  will send out electrical currents.  Dye may be put through the tube. This helps your surgeon see your heart.  Electrical energy will be used to scar (ablate) some heart tissue. Your surgeon may use: ? Heat (radiofrequency energy). ? Laser energy. ? Extreme cold (cryoablation).  The tube will be taken out.  Pressure will be held on your cut. This helps stop bleeding.  A bandage (dressing) will be put on your cut. The procedure may vary. What happens after the procedure?  You will be monitored until your medicines have worn off.  Your cut will be watched for bleeding. You will need to lie still for a few hours.  Do not drive for 24 hours or as long as your doctor tells you. Summary  Cardiac ablation is a procedure to stop some heart tissue from causing problems.  Electrical energy will be used to scar (ablate) some heart tissue. This information is not intended to replace advice given to you by your health care provider. Make sure you discuss any questions you have with your health care provider. Document Released: 05/10/2013 Document Revised: 07/27/2016 Document Reviewed: 07/27/2016 Elsevier Interactive Patient Education  2019 [employer].

## 2018-12-05 ENCOUNTER — Telehealth: Payer: Self-pay | Admitting: Pharmacist

## 2018-12-05 NOTE — Telephone Encounter (Signed)
Oral Chemotherapy Pharmacist Encounter  Follow-Up Form  Called patient today to follow up regarding patient's oral chemotherapy medication: Inlyta (axitinib)  Original Start date of oral chemotherapy: 06/2018  Pt reports 0 tablets/doses of Inlyta missed so far this month.   Pt reports the following side effects: None reported  Recent labs reviewed: CMP from 11/24/2018, AST/ALT, tbili wnl  New medications?: None reported  Other Issues: None reported  Patient knows to call the office with questions or concerns. Oral Oncology Clinic will continue to follow.  Darl Pikes, PharmD, BCPS, [facility] Hematology/Oncology Clinical Pharmacist [facility] [phone]  12/05/2018 3:13 PM

## 2018-12-05 NOTE — Telephone Encounter (Signed)
Oral Chemotherapy Pharmacist Encounter   Attempted to reach patient for follow up on oral medication: Inlyta (axitinib). No answer. Left VM for patient to call back.    Darl Pikes, PharmD, BCPS, [facility] Hematology/Oncology Clinical Pharmacist [facility] [phone]  12/05/2018 1:24 PM

## 2018-12-09 ENCOUNTER — Telehealth: Payer: Self-pay | Admitting: *Deleted

## 2018-12-09 ENCOUNTER — Other Ambulatory Visit: Payer: Self-pay | Admitting: Family Medicine

## 2018-12-09 NOTE — Telephone Encounter (Signed)
Pt informed procedure being cancelled at this time d/t COVID-19.  Pt aware office will call him once procedure can be rescheduled. Advised to call the office if elevated HR/SVT issues begin..   Pt agreeable to plan.

## 2018-12-12 MED FILL — INLYTA 5 MG TABLET: 5 | 28 days supply | Qty: 42 | Fill #1

## 2018-12-15 ENCOUNTER — Other Ambulatory Visit: Payer: Self-pay

## 2018-12-15 ENCOUNTER — Inpatient Hospital Stay: Payer: Medicare Other

## 2018-12-15 ENCOUNTER — Inpatient Hospital Stay (HOSPITAL_BASED_OUTPATIENT_CLINIC_OR_DEPARTMENT_OTHER): Payer: Medicare Other | Admitting: Hematology & Oncology

## 2018-12-15 ENCOUNTER — Encounter: Payer: Self-pay | Admitting: Hematology & Oncology

## 2018-12-15 VITALS — BP 181/73 | HR 58 | Temp 98.0°F | Resp 17 | Wt 157.0 lb

## 2018-12-15 VITALS — BP 139/62

## 2018-12-15 DIAGNOSIS — Z79899 Other long term (current) drug therapy: Secondary | ICD-10-CM | POA: Diagnosis not present

## 2018-12-15 DIAGNOSIS — C641 Malignant neoplasm of right kidney, except renal pelvis: Secondary | ICD-10-CM

## 2018-12-15 DIAGNOSIS — Z923 Personal history of irradiation: Secondary | ICD-10-CM

## 2018-12-15 DIAGNOSIS — Z86006 Personal history of melanoma in-situ: Secondary | ICD-10-CM

## 2018-12-15 DIAGNOSIS — C7951 Secondary malignant neoplasm of bone: Secondary | ICD-10-CM

## 2018-12-15 DIAGNOSIS — Z8546 Personal history of malignant neoplasm of prostate: Secondary | ICD-10-CM

## 2018-12-15 DIAGNOSIS — Z5112 Encounter for antineoplastic immunotherapy: Secondary | ICD-10-CM | POA: Diagnosis not present

## 2018-12-15 DIAGNOSIS — E032 Hypothyroidism due to medicaments and other exogenous substances: Secondary | ICD-10-CM

## 2018-12-15 DIAGNOSIS — M4850XS Collapsed vertebra, not elsewhere classified, site unspecified, sequela of fracture: Secondary | ICD-10-CM

## 2018-12-15 DIAGNOSIS — M79662 Pain in left lower leg: Secondary | ICD-10-CM | POA: Diagnosis not present

## 2018-12-15 LAB — CBC WITH DIFFERENTIAL (CANCER CENTER ONLY)
Abs Immature Granulocytes: 0.02 10*3/uL (ref 0.00–0.07)
Basophils Absolute: 0 10*3/uL (ref 0.0–0.1)
Basophils Relative: 1 %
Eosinophils Absolute: 0.1 10*3/uL (ref 0.0–0.5)
Eosinophils Relative: 1 %
HCT: 45.6 % (ref 39.0–52.0)
Hemoglobin: 14.3 g/dL (ref 13.0–17.0)
Immature Granulocytes: 0 %
Lymphocytes Relative: 23 %
Lymphs Abs: 1.5 10*3/uL (ref 0.7–4.0)
MCH: 28.8 pg (ref 26.0–34.0)
MCHC: 31.4 g/dL (ref 30.0–36.0)
MCV: 91.9 fL (ref 80.0–100.0)
Monocytes Absolute: 0.5 10*3/uL (ref 0.1–1.0)
Monocytes Relative: 7 %
NEUTROS ABS: 4.6 10*3/uL (ref 1.7–7.7)
Neutrophils Relative %: 68 %
Platelet Count: 182 10*3/uL (ref 150–400)
RBC: 4.96 MIL/uL (ref 4.22–5.81)
RDW: 15.3 % (ref 11.5–15.5)
WBC Count: 6.7 10*3/uL (ref 4.0–10.5)
nRBC: 0 % (ref 0.0–0.2)

## 2018-12-15 LAB — CMP (CANCER CENTER ONLY)
ALT: 16 U/L (ref 0–44)
AST: 16 U/L (ref 15–41)
Albumin: 3.6 g/dL (ref 3.5–5.0)
Alkaline Phosphatase: 93 U/L (ref 38–126)
Anion gap: 7 (ref 5–15)
BUN: 18 mg/dL (ref 8–23)
CO2: 31 mmol/L (ref 22–32)
Calcium: 9.2 mg/dL (ref 8.9–10.3)
Chloride: 104 mmol/L (ref 98–111)
Creatinine: 1 mg/dL (ref 0.61–1.24)
GFR, Est AFR Am: >60 mL/min (ref >60)
GFR, Estimated: >60 mL/min (ref >60)
Glucose, Bld: 121 mg/dL — ABNORMAL HIGH (ref 70–99)
Potassium: 4.2 mmol/L (ref 3.5–5.1)
Sodium: 142 mmol/L (ref 135–145)
Total Bilirubin: 0.7 mg/dL (ref 0.3–1.2)
Total Protein: 5.9 g/dL — ABNORMAL LOW (ref 6.5–8.1)

## 2018-12-15 LAB — LACTATE DEHYDROGENASE: LDH: 200 U/L — ABNORMAL HIGH (ref 98–192)

## 2018-12-15 LAB — TSH: TSH: 7.359 u[IU]/mL — ABNORMAL HIGH (ref 0.320–4.118)

## 2018-12-15 MED ORDER — DOXYCYCLINE HYCLATE 100 MG PO TABS: 100.0000 mg | ORAL_TABLET | Freq: Two times a day (BID) | ORAL | 0 refills | Status: DC

## 2018-12-15 MED ORDER — SODIUM CHLORIDE 0.9 % IV SOLN
Freq: Once | INTRAVENOUS | Status: AC
  Administered 2018-12-15: 12:00:00 via INTRAVENOUS
  Filled 2018-12-15: qty 250

## 2018-12-15 MED ORDER — SODIUM CHLORIDE 0.9 % IV SOLN
200.0000 mg | Freq: Once | INTRAVENOUS | Status: AC
  Administered 2018-12-15: 200 mg via INTRAVENOUS
  Filled 2018-12-15: qty 8

## 2018-12-15 NOTE — Progress Notes (Signed)
Hematology and Oncology Follow Up Visit  [name] [name] [id number] 09-18-1941 78 y.o. 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  This is a very busy time a year for him.  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  He had a tick on his left lower abdominal wall.  I remove this intact.  I will go ahead and give him some doxycycline to take.  He has had no problems with his bowels or bladder.  He has had no leg swelling.  His pain seems to be doing pretty well with his left hip.  He has had no mouth sores.  He has had no headache.  He is tolerated the Inlyta very well.  He is on the week break of his Inlyta right now.  We will have to re-scan him after this cycle is over.   ECOG Performance Status: 1 - Symptomatic but completely ambulatory  Medications:  Allergies as of 12/15/2018   No Known Allergies     Medication List       Accurate as of December 15, 2018 10:48 AM. Always use your most recent med list.        amLODipine 10 MG tablet Commonly known as:  NORVASC TAKE 1 TABLET(10 MG) BY MOUTH DAILY   Anoro Ellipta 62.5-25 MCG/INH Aepb Generic drug:  umeclidinium-vilanterol INHALE 1 PUFF BY MOUTH EVERY DAY   aspirin EC 81 MG tablet Take 1 tablet (81 mg total) by mouth daily.   clopidogrel 75 MG tablet Commonly known as:  PLAVIX TAKE 1 TABLET BY MOUTH ONCE DAILY   CONTOUR BLOOD GLUCOSE SYSTEM Devi Diagnoses - Diabetes E 11.8 Check blood sugar once daily   doxazosin 4 MG tablet Commonly known as:  CARDURA  Take 4 mg by mouth.   gabapentin 300 MG capsule Commonly known as:  NEURONTIN One tab PO qHS for a week, then BID for a week, then TID. May double weekly to a max of 3,670m/day   glucose blood test strip Commonly known as:  BEstate manager/land agentDiagnoses - Diabetes E 11.8 Check blood sugar once daily   Inlyta 5 MG tablet Generic drug:  axitinib TAKE 1 TABLET (5MG) BY MOUTH TWICE DAILY FOR 21 DAYS ON, THEN 7 DAYS OFF.   levothyroxine 50 MCG tablet Commonly known as:  Synthroid Take 1 tablet (50 mcg total) by mouth daily before breakfast.   losartan 100 MG tablet Commonly known as:  COZAAR TAKE 1 TABLET BY MOUTH ONCE DAILY   metFORMIN 500 MG 24 hr tablet Commonly known as:  GLUCOPHAGE-XR TAKE 2 TABLETS BY MOUTH EVERY DAY WITH BREAKFAST   metoprolol succinate 100 MG 24 hr tablet Commonly known as:  TOPROL-XL TAKE 1 TABLET BY MOUTH EVERY DAY   multivitamin with minerals tablet Take 1 tablet by mouth daily.   oxyCODONE ER 13.5 MG C12a Commonly known as:  Xtampza ER Take 13.5 mg by mouth every 12 (twelve) hours.   ProAir HFA 108 (90 Base) MCG/ACT inhaler Generic drug:  albuterol INHALE 2 PUFFS INTO THE LUNGS EVERY 6 HOURS AS NEEDED FOR WHEEZING   prochlorperazine 10 MG tablet Commonly known as:  COMPAZINE TAKE 1 TABLET(10 MG) BY MOUTH EVERY 6 HOURS AS NEEDED FOR NAUSEA OR VOMITING       Allergies: No Known Allergies  Past Medical History, Surgical history, Social history, and Family History were reviewed and updated.  Review of Systems: Review of Systems  Constitutional: Negative.   HENT: Negative.   Eyes: Negative.   Respiratory: Negative.   Cardiovascular: Negative.   Gastrointestinal: Negative.   Genitourinary: Negative.   Musculoskeletal: Negative.   Skin: Negative.   Neurological: Negative.   Endo/Heme/Allergies: Negative.   Psychiatric/Behavioral: Negative.      Physical Exam:  weight is 157 lb (71.2 kg). His oral temperature is 98 F (36.7 C).  His blood pressure is 181/73 (abnormal) and his pulse is 58 (abnormal). His respiration is 17 and oxygen saturation is 98%.   Wt Readings from Last 3 Encounters:  12/15/18 157 lb (71.2 kg)  11/25/18 155 lb 6.4 oz (70.5 kg)  11/24/18 155 lb 4 oz (70.4 kg)    Physical Exam Vitals signs reviewed.  HENT:     Head: Normocephalic and atraumatic.  Eyes:     Pupils: Pupils are equal, round, and reactive to light.  Neck:     Musculoskeletal: Normal range of motion.  Cardiovascular:     Rate and Rhythm: Normal rate and regular rhythm.     Heart sounds: Normal heart sounds.  Pulmonary:     Effort: Pulmonary effort is normal.     Breath sounds: Normal breath sounds.  Abdominal:     General: Bowel sounds are normal.     Palpations: Abdomen is soft.     Comments: Abdominal exam shows the tic wound in the left lower quadrant.  Is erythematous.  Is slightly firm.  It probably measures about 1 cm x 1.5 cm.  There is no hepatomegaly.  There is no splenomegaly.  Musculoskeletal: Normal range of motion.        General: No tenderness or deformity.  Lymphadenopathy:     Cervical: No cervical adenopathy.  Skin:    General: Skin is warm and dry.     Findings: No erythema or rash.  Neurological:     Mental Status: He is alert and oriented to person, place, and time.  Psychiatric:        Behavior: Behavior normal.        Thought Content: Thought content normal.        Judgment: Judgment normal.       Lab Results  Component Value Date   WBC 6.7 12/15/2018   HGB 14.3 12/15/2018   HCT 45.6 12/15/2018   MCV 91.9 12/15/2018   PLT 182 12/15/2018   Lab Results  Component Value Date   FERRITIN 95 10/01/2017   Lab Results  Component Value Date   RBC 4.96 12/15/2018   Lab Results  Component Value Date   KPAFRELGTCHN 28.2 (H) 05/05/2018   LAMBDASER 24.6 05/05/2018   KAPLAMBRATIO 5.75 05/09/2018   Lab Results  Component Value Date   IGGSERUM 799 05/05/2018   IGA 229 05/05/2018    IGMSERUM 136 05/05/2018   Lab Results  Component Value Date   TOTALPROTELP 6.6 05/05/2018   ALBUMINELP 3.4 05/05/2018   A1GS 0.3 05/05/2018   A2GS 1.1 (H) 05/05/2018   BETS 0.9 05/05/2018   BETA2SER 0.4 04/29/2018   GAMS 0.8 05/05/2018   MSPIKE  Not Observed 05/05/2018   SPEI  04/29/2018     Comment:     . Evaluation is consistent with an acute inflammatory  pattern. . . A faint abnormal protein band is detected in the gamma globulins that most likely represents CRP, or circulating immune complexes, rather than a monoclonal immunoglobulin. . . Immunofixation analysis is available if identification of the band(s) is clinically indicated. .      Chemistry      Component Value Date/Time   NA 142 12/15/2018 0944   NA 140 11/25/2018 1055   K 4.2 12/15/2018 0944   CL 104 12/15/2018 0944   CO2 31 12/15/2018 0944   BUN 18 12/15/2018 0944   BUN 20 11/25/2018 1055   CREATININE 1.00 12/15/2018 0944   CREATININE 1.11 04/29/2018 1047      Component Value Date/Time   CALCIUM 9.2 12/15/2018 0944   ALKPHOS 93 12/15/2018 0944   AST 16 12/15/2018 0944   ALT 16 12/15/2018 0944   BILITOT 0.7 12/15/2018 0944      Impression and Plan: Mr. Teuscher is a very pleasant 78 yo caucasian gentleman with metastatic kidney cancer. He also has history of prostate cancer and melanoma in situ.   We will go ahead with his pembrolizumab today.  I will plan for another set of scans on him in about 2 weeks.  Hopefully, we will see that he has a continued response.  I think his quality of life is doing much better.  This is quite encouraging.  I will see him back in 3 weeks.  I did spend about 30 minutes with him today.  I had to get the tick out of his abdomen.  This did take a while as I had to make sure that the tick came out intact.  It did.  I had to counsel him about the antibiotics and what to watch out for with respect to tick borne infections.  He understands this.   [name],  MD 3/26/202010:48 AM

## 2018-12-16 LAB — CANCER ANTIGEN 27.29: CA 27.29: 44.7 U/mL — ABNORMAL HIGH (ref 0.0–38.6)

## 2018-12-23 ENCOUNTER — Ambulatory Visit (HOSPITAL_COMMUNITY): Admit: 2018-12-23 | Payer: Medicare Other | Admitting: Cardiology

## 2018-12-23 ENCOUNTER — Encounter (HOSPITAL_COMMUNITY): Payer: Self-pay

## 2018-12-23 SURGERY — SVT ABLATION

## 2018-12-26 ENCOUNTER — Other Ambulatory Visit: Payer: Self-pay | Admitting: Cardiovascular Disease

## 2018-12-26 ENCOUNTER — Other Ambulatory Visit: Payer: Self-pay | Admitting: Hematology & Oncology

## 2018-12-26 DIAGNOSIS — C7951 Secondary malignant neoplasm of bone: Secondary | ICD-10-CM

## 2018-12-26 DIAGNOSIS — M8458XA Pathological fracture in neoplastic disease, other specified site, initial encounter for fracture: Secondary | ICD-10-CM

## 2018-12-26 DIAGNOSIS — C641 Malignant neoplasm of right kidney, except renal pelvis: Secondary | ICD-10-CM

## 2018-12-26 DIAGNOSIS — M79605 Pain in left leg: Secondary | ICD-10-CM

## 2018-12-26 MED ORDER — OXYCODONE ER 13.5 MG PO C12A: 13.5000 mg | EXTENDED_RELEASE_CAPSULE | Freq: Two times a day (BID) | ORAL | 0 refills | Status: DC

## 2018-12-28 ENCOUNTER — Other Ambulatory Visit: Payer: Self-pay | Admitting: Hematology & Oncology

## 2018-12-28 ENCOUNTER — Telehealth: Payer: Self-pay | Admitting: *Deleted

## 2018-12-28 MED ORDER — OXYCODONE HCL 10 MG PO TABS: 10.0000 mg | ORAL_TABLET | Freq: Four times a day (QID) | ORAL | 0 refills | Status: DC | PRN

## 2018-12-28 NOTE — Telephone Encounter (Signed)
Call received from patient stating that Doxycycline is causing him to have a red rash and would like to know if he can stop the [facility] ER and restart Oxycodone.  Dr. Marin Olp notified and order received for pt to stop Doxycycline and that prescription for Oxycodone would be sent in once [facility] ER is complete.  Call placed to patient and he states that he has not picked up his refill for [facility] ER sent on 12/26/18.  Confirmed with [facility] in [place] that pt did not pick up [facility] ER refill. Dr. Marin Olp notified and stated that he would take care of Oxycodone refill request.

## 2019-01-05 ENCOUNTER — Telehealth: Payer: Self-pay

## 2019-01-05 ENCOUNTER — Inpatient Hospital Stay (HOSPITAL_BASED_OUTPATIENT_CLINIC_OR_DEPARTMENT_OTHER): Payer: Medicare Other | Admitting: Hematology & Oncology

## 2019-01-05 ENCOUNTER — Inpatient Hospital Stay: Payer: Medicare Other | Attending: Hematology & Oncology

## 2019-01-05 ENCOUNTER — Encounter: Payer: Self-pay | Admitting: Hematology & Oncology

## 2019-01-05 ENCOUNTER — Inpatient Hospital Stay: Payer: Medicare Other

## 2019-01-05 ENCOUNTER — Other Ambulatory Visit: Payer: Self-pay

## 2019-01-05 VITALS — BP 197/93 | HR 55 | Temp 97.7°F | Resp 19 | Wt 151.0 lb

## 2019-01-05 DIAGNOSIS — Z79899 Other long term (current) drug therapy: Secondary | ICD-10-CM | POA: Diagnosis not present

## 2019-01-05 DIAGNOSIS — Z923 Personal history of irradiation: Secondary | ICD-10-CM | POA: Insufficient documentation

## 2019-01-05 DIAGNOSIS — C7951 Secondary malignant neoplasm of bone: Secondary | ICD-10-CM | POA: Diagnosis not present

## 2019-01-05 DIAGNOSIS — Z8546 Personal history of malignant neoplasm of prostate: Secondary | ICD-10-CM | POA: Insufficient documentation

## 2019-01-05 DIAGNOSIS — C641 Malignant neoplasm of right kidney, except renal pelvis: Secondary | ICD-10-CM

## 2019-01-05 DIAGNOSIS — Z7982 Long term (current) use of aspirin: Secondary | ICD-10-CM

## 2019-01-05 DIAGNOSIS — Z5112 Encounter for antineoplastic immunotherapy: Secondary | ICD-10-CM | POA: Insufficient documentation

## 2019-01-05 DIAGNOSIS — Z86007 Personal history of in-situ neoplasm of skin: Secondary | ICD-10-CM | POA: Insufficient documentation

## 2019-01-05 LAB — CBC WITH DIFFERENTIAL (CANCER CENTER ONLY)
Abs Immature Granulocytes: 0.03 10*3/uL (ref 0.00–0.07)
Basophils Absolute: 0 10*3/uL (ref 0.0–0.1)
Basophils Relative: 1 %
Eosinophils Absolute: 0.1 10*3/uL (ref 0.0–0.5)
Eosinophils Relative: 1 %
HCT: 45.8 % (ref 39.0–52.0)
Hemoglobin: 15.2 g/dL (ref 13.0–17.0)
Immature Granulocytes: 1 %
Lymphocytes Relative: 30 %
Lymphs Abs: 1.9 10*3/uL (ref 0.7–4.0)
MCH: 29.2 pg (ref 26.0–34.0)
MCHC: 33.2 g/dL (ref 30.0–36.0)
MCV: 88.1 fL (ref 80.0–100.0)
Monocytes Absolute: 0.4 10*3/uL (ref 0.1–1.0)
Monocytes Relative: 6 %
Neutro Abs: 3.8 10*3/uL (ref 1.7–7.7)
Neutrophils Relative %: 61 %
Platelet Count: 157 10*3/uL (ref 150–400)
RBC: 5.2 MIL/uL (ref 4.22–5.81)
RDW: 14.9 % (ref 11.5–15.5)
WBC Count: 6.2 10*3/uL (ref 4.0–10.5)
nRBC: 0 % (ref 0.0–0.2)

## 2019-01-05 LAB — CMP (CANCER CENTER ONLY)
ALT: 21 U/L (ref 0–44)
AST: 22 U/L (ref 15–41)
Albumin: 3.6 g/dL (ref 3.5–5.0)
Alkaline Phosphatase: 93 U/L (ref 38–126)
Anion gap: 8 (ref 5–15)
BUN: 23 mg/dL (ref 8–23)
CO2: 29 mmol/L (ref 22–32)
Calcium: 10 mg/dL (ref 8.9–10.3)
Chloride: 99 mmol/L (ref 98–111)
Creatinine: 0.99 mg/dL (ref 0.61–1.24)
GFR, Est AFR Am: >60 mL/min (ref >60)
GFR, Estimated: >60 mL/min (ref >60)
Glucose, Bld: 126 mg/dL — ABNORMAL HIGH (ref 70–99)
Potassium: 3.6 mmol/L (ref 3.5–5.1)
Sodium: 136 mmol/L (ref 135–145)
Total Bilirubin: 0.6 mg/dL (ref 0.3–1.2)
Total Protein: 6.4 g/dL — ABNORMAL LOW (ref 6.5–8.1)

## 2019-01-05 LAB — LACTATE DEHYDROGENASE: LDH: 205 U/L — ABNORMAL HIGH (ref 98–192)

## 2019-01-05 MED ORDER — SODIUM CHLORIDE 0.9 % IV SOLN
Freq: Once | INTRAVENOUS | Status: AC
  Administered 2019-01-05: 11:00:00 via INTRAVENOUS
  Filled 2019-01-05: qty 250

## 2019-01-05 MED ORDER — SODIUM CHLORIDE 0.9 % IV SOLN
200.0000 mg | Freq: Once | INTRAVENOUS | Status: AC
  Administered 2019-01-05: 200 mg via INTRAVENOUS
  Filled 2019-01-05: qty 8

## 2019-01-05 MED ORDER — DENOSUMAB 120 MG/1.7ML ~~LOC~~ SOLN
SUBCUTANEOUS | Status: AC
  Filled 2019-01-05: qty 1.7

## 2019-01-05 MED ORDER — DENOSUMAB 120 MG/1.7ML ~~LOC~~ SOLN
120.0000 mg | Freq: Once | SUBCUTANEOUS | Status: AC
  Administered 2019-01-05: 120 mg via SUBCUTANEOUS

## 2019-01-05 MED FILL — INLYTA 5 MG TABLET: 5 | 28 days supply | Qty: 42 | Fill #2

## 2019-01-05 NOTE — Telephone Encounter (Signed)
Oral Oncology Patient Advocate Encounter   Was successful in securing patient an $40,900 grant from Patient [facility]) to provide copayment coverage for Inlyta.  This will keep the out of pocket expense at $0.     I called the patient and left a voicemail.    The billing information is as follows and has been shared with [place].   Member ID: [id number] Group ID: [id number] RxBin: [id number] Dates of Eligibility: 10/07/18 through 01/04/20  [place] Patient [place] Phone [phone] Fax [phone] 01/05/2019    10:04 AM

## 2019-01-05 NOTE — Patient Instructions (Signed)
Denosumab injection What is this medicine? DENOSUMAB (den oh sue mab) slows bone breakdown. Prolia is used to treat osteoporosis in women after menopause and in men, and in people who are taking corticosteroids for 6 months or more. Xgeva is used to treat a high calcium level due to cancer and to prevent bone fractures and other bone problems caused by multiple myeloma or cancer bone metastases. Xgeva is also used to treat giant cell tumor of the bone. This medicine may be used for other purposes; ask your health care provider or pharmacist if you have questions. COMMON BRAND NAME(S): Prolia, XGEVA What should I tell my health care provider before I take this medicine? They need to know if you have any of these conditions: -dental disease -having surgery or tooth extraction -infection -kidney disease -low levels of calcium or Vitamin D in the blood -malnutrition -on hemodialysis -skin conditions or sensitivity -thyroid or parathyroid disease -an unusual reaction to denosumab, other medicines, foods, dyes, or preservatives -pregnant or trying to get pregnant -breast-feeding How should I use this medicine? This medicine is for injection under the skin. It is given by a health care professional in a hospital or clinic setting. A special MedGuide will be given to you before each treatment. Be sure to read this information carefully each time. For Prolia, talk to your pediatrician regarding the use of this medicine in children. Special care may be needed. For Xgeva, talk to your pediatrician regarding the use of this medicine in children. While this drug may be prescribed for children as young as 13 years for selected conditions, precautions do apply. Overdosage: If you think you have taken too much of this medicine contact a poison control center or emergency room at once. NOTE: This medicine is only for you. Do not share this medicine with others. What if I miss a dose? It is important not to  miss your dose. Call your doctor or health care professional if you are unable to keep an appointment. What may interact with this medicine? Do not take this medicine with any of the following medications: -other medicines containing denosumab This medicine may also interact with the following medications: -medicines that lower your chance of fighting infection -steroid medicines like prednisone or cortisone This list may not describe all possible interactions. Give your health care provider a list of all the medicines, herbs, non-prescription drugs, or dietary supplements you use. Also tell them if you smoke, drink alcohol, or use illegal drugs. Some items may interact with your medicine. What should I watch for while using this medicine? Visit your doctor or health care professional for regular checks on your progress. Your doctor or health care professional may order blood tests and other tests to see how you are doing. Call your doctor or health care professional for advice if you get a fever, chills or sore throat, or other symptoms of a cold or flu. Do not treat yourself. This drug may decrease your body's ability to fight infection. Try to avoid being around people who are sick. You should make sure you get enough calcium and vitamin D while you are taking this medicine, unless your doctor tells you not to. Discuss the foods you eat and the vitamins you take with your health care professional. See your dentist regularly. Brush and floss your teeth as directed. Before you have any dental work done, tell your dentist you are receiving this medicine. Do not become pregnant while taking this medicine or for 5 months   after stopping it. Talk with your doctor or health care professional about your birth control options while taking this medicine. Women should inform their doctor if they wish to become pregnant or think they might be pregnant. There is a potential for serious side effects to an unborn  child. Talk to your health care professional or pharmacist for more information. What side effects may I notice from receiving this medicine? Side effects that you should report to your doctor or health care professional as soon as possible: -allergic reactions like skin rash, itching or hives, swelling of the face, lips, or tongue -bone pain -breathing problems -dizziness -jaw pain, especially after dental work -redness, blistering, peeling of the skin -signs and symptoms of infection like fever or chills; cough; sore throat; pain or trouble passing urine -signs of low calcium like fast heartbeat, muscle cramps or muscle pain; pain, tingling, numbness in the hands or feet; seizures -unusual bleeding or bruising -unusually weak or tired Side effects that usually do not require medical attention (report to your doctor or health care professional if they continue or are bothersome): -constipation -diarrhea -headache -joint pain -loss of appetite -muscle pain -runny nose -tiredness -upset stomach This list may not describe all possible side effects. Call your doctor for medical advice about side effects. You may report side effects to FDA at 1-800-FDA-1088. Where should I keep my medicine? This medicine is only given in a clinic, doctor's office, or other health care setting and will not be stored at home. NOTE: This sheet is a summary. It may not cover all possible information. If you have questions about this medicine, talk to your doctor, pharmacist, or health care provider.  2019 Elsevier/Gold Standard (2018-01-14 16:10:44) Pembrolizumab injection What is this medicine? PEMBROLIZUMAB (pem broe liz ue mab) is a monoclonal antibody. It is used to treat cervical cancer, esophageal cancer, head and neck cancer, hepatocellular cancer, Hodgkin lymphoma, kidney cancer, lymphoma, melanoma, Merkel cell carcinoma, lung cancer, stomach cancer, urothelial cancer, and cancers that have a certain  genetic condition. This medicine may be used for other purposes; ask your health care provider or pharmacist if you have questions. COMMON BRAND NAME(S): Keytruda What should I tell my health care provider before I take this medicine? They need to know if you have any of these conditions: -diabetes -immune system problems -inflammatory bowel disease -liver disease -lung or breathing disease -lupus -received or scheduled to receive an organ transplant or a stem-cell transplant that uses donor stem cells -an unusual or allergic reaction to pembrolizumab, other medicines, foods, dyes, or preservatives -pregnant or trying to get pregnant -breast-feeding How should I use this medicine? This medicine is for infusion into a vein. It is given by a health care professional in a hospital or clinic setting. A special MedGuide will be given to you before each treatment. Be sure to read this information carefully each time. Talk to your pediatrician regarding the use of this medicine in children. While this drug may be prescribed for selected conditions, precautions do apply. Overdosage: If you think you have taken too much of this medicine contact a poison control center or emergency room at once. NOTE: This medicine is only for you. Do not share this medicine with others. What if I miss a dose? It is important not to miss your dose. Call your doctor or health care professional if you are unable to keep an appointment. What may interact with this medicine? Interactions have not been studied. Give your health care provider  a list of all the medicines, herbs, non-prescription drugs, or dietary supplements you use. Also tell them if you smoke, drink alcohol, or use illegal drugs. Some items may interact with your medicine. This list may not describe all possible interactions. Give your health care provider a list of all the medicines, herbs, non-prescription drugs, or dietary supplements you use. Also  tell them if you smoke, drink alcohol, or use illegal drugs. Some items may interact with your medicine. What should I watch for while using this medicine? Your condition will be monitored carefully while you are receiving this medicine. You may need blood work done while you are taking this medicine. Do not become pregnant while taking this medicine or for 4 months after stopping it. Women should inform their doctor if they wish to become pregnant or think they might be pregnant. There is a potential for serious side effects to an unborn child. Talk to your health care professional or pharmacist for more information. Do not breast-feed an infant while taking this medicine or for 4 months after the last dose. What side effects may I notice from receiving this medicine? Side effects that you should report to your doctor or health care professional as soon as possible: -allergic reactions like skin rash, itching or hives, swelling of the face, lips, or tongue -bloody or black, tarry -breathing problems -changes in vision -chest pain -chills -confusion -constipation -cough -diarrhea -dizziness or feeling faint or lightheaded -fast or irregular heartbeat -fever -flushing -hair loss -joint pain -low blood counts - this medicine may decrease the number of white blood cells, red blood cells and platelets. You may be at increased risk for infections and bleeding. -muscle pain -muscle weakness -persistent headache -redness, blistering, peeling or loosening of the skin, including inside the mouth -signs and symptoms of high blood sugar such as dizziness; dry mouth; dry skin; fruity breath; nausea; stomach pain; increased hunger or thirst; increased urination -signs and symptoms of kidney injury like trouble passing urine or change in the amount of urine -signs and symptoms of liver injury like dark urine, light-colored stools, loss of appetite, nausea, right upper belly pain, yellowing of the  eyes or skin -sweating -swollen lymph nodes -weight loss Side effects that usually do not require medical attention (report to your doctor or health care professional if they continue or are bothersome): -decreased appetite -muscle pain -tiredness This list may not describe all possible side effects. Call your doctor for medical advice about side effects. You may report side effects to FDA at 1-800-FDA-1088. Where should I keep my medicine? This drug is given in a hospital or clinic and will not be stored at home. NOTE: This sheet is a summary. It may not cover all possible information. If you have questions about this medicine, talk to your doctor, pharmacist, or health care provider.  2019 Elsevier/Gold Standard (2018-04-21 15:06:10)

## 2019-01-05 NOTE — Progress Notes (Signed)
Hematology and Oncology Follow Up Visit  [name] [name] [id number] [birth year] 78 y.o. 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  He is doing pretty well.  He is staying home.  He says he has stayed home for 3 weeks.  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  He hates not being able to do his job.  He is done well with the Inlyta and pembrolizumab.  He was supposed to have a CT scan done before we saw him today.  For some reason, this was never scheduled.  I am sure that the coronavirus has appended a lot of schedules for radiology.  We will go ahead and try to get a CT scan in 2 weeks.  He has had no problems with cough or shortness of breath.  His pain seems to be doing a little bit better.  We tried him on Xtampza which he did not do well with.  He says he is doing well with the oxycodone.  He takes this every 6 hours.  This allows him to be functional.  He has had a good appetite.  I think his weight still might be coming down a little bit.  He has had no leg swelling.  He has had no bleeding.  Overall, his performance status is ECOG 1.  Medications:  Allergies as of 01/05/2019   No Known Allergies     Medication List       Accurate as of January 05, 2019 10:51 AM. Always use your most recent med list.        amLODipine 10 MG tablet Commonly known as:  NORVASC TAKE 1 TABLET(10 MG) BY MOUTH DAILY   Anoro Ellipta 62.5-25 MCG/INH Aepb Generic drug:  umeclidinium-vilanterol INHALE 1 PUFF BY MOUTH EVERY DAY   aspirin EC 81 MG tablet Take 1 tablet (81 mg total) by mouth daily.   clopidogrel 75 MG tablet Commonly known as:  PLAVIX TAKE 1  TABLET BY MOUTH ONCE DAILY   CONTOUR BLOOD GLUCOSE SYSTEM Devi Diagnoses - Diabetes E 11.8 Check blood sugar once daily   doxazosin 4 MG tablet Commonly known as:  CARDURA Take 4 mg by mouth.   doxycycline 100 MG tablet Commonly known as:  VIBRA-TABS TAKE 1 TABLET(100 MG) BY MOUTH TWICE DAILY   gabapentin 300 MG capsule Commonly known as:  NEURONTIN One tab PO qHS for a week, then BID for a week, then TID. May double weekly to a max of 3,667m/day   glucose blood test strip Commonly known as:  BEstate manager/land agentDiagnoses - Diabetes E 11.8 Check blood sugar once daily   Inlyta 5 MG tablet Generic drug:  axitinib TAKE 1 TABLET (5MG) BY MOUTH TWICE DAILY FOR 21 DAYS ON, THEN 7 DAYS OFF.   levothyroxine 50 MCG tablet Commonly known as:  Synthroid Take 1 tablet (50 mcg total) by mouth daily before breakfast.   losartan 100 MG tablet Commonly known as:  COZAAR TAKE 1 TABLET BY MOUTH EVERY DAY   metFORMIN 500 MG 24 hr tablet Commonly known as:  GLUCOPHAGE-XR TAKE 2 TABLETS BY MOUTH EVERY DAY WITH BREAKFAST  metoprolol succinate 100 MG 24 hr tablet Commonly known as:  TOPROL-XL TAKE 1 TABLET BY MOUTH EVERY DAY   multivitamin with minerals tablet Take 1 tablet by mouth daily.   Oxycodone HCl 10 MG Tabs Take 1 tablet (10 mg total) by mouth every 6 (six) hours as needed for severe pain.   ProAir HFA 108 (90 Base) MCG/ACT inhaler Generic drug:  albuterol INHALE 2 PUFFS INTO THE LUNGS EVERY 6 HOURS AS NEEDED FOR WHEEZING   prochlorperazine 10 MG tablet Commonly known as:  COMPAZINE TAKE 1 TABLET(10 MG) BY MOUTH EVERY 6 HOURS AS NEEDED FOR NAUSEA OR VOMITING       Allergies: No Known Allergies  Past Medical History, Surgical history, Social history, and Family History were reviewed and updated.  Review of Systems: Review of Systems  Constitutional: Negative.   HENT: Negative.   Eyes: Negative.   Respiratory: Negative.   Cardiovascular: Negative.    Gastrointestinal: Negative.   Genitourinary: Negative.   Musculoskeletal: Negative.   Skin: Negative.   Neurological: Negative.   Endo/Heme/Allergies: Negative.   Psychiatric/Behavioral: Negative.      Physical Exam:  weight is 151 lb (68.5 kg). His oral temperature is 97.7 F (36.5 C). His blood pressure is 197/93 (abnormal) and his pulse is 55 (abnormal). His respiration is 19 and oxygen saturation is 99%.   Wt Readings from Last 3 Encounters:  01/05/19 151 lb (68.5 kg)  12/15/18 157 lb (71.2 kg)  11/25/18 155 lb 6.4 oz (70.5 kg)    Physical Exam Vitals signs reviewed.  HENT:     Head: Normocephalic and atraumatic.  Eyes:     Pupils: Pupils are equal, round, and reactive to light.  Neck:     Musculoskeletal: Normal range of motion.  Cardiovascular:     Rate and Rhythm: Normal rate and regular rhythm.     Heart sounds: Normal heart sounds.  Pulmonary:     Effort: Pulmonary effort is normal.     Breath sounds: Normal breath sounds.  Abdominal:     General: Bowel sounds are normal.     Palpations: Abdomen is soft.     Comments: Abdominal exam shows the tic wound in the left lower quadrant.  Is erythematous.  Is slightly firm.  It probably measures about 1 cm x 1.5 cm.  There is no hepatomegaly.  There is no splenomegaly.  Musculoskeletal: Normal range of motion.        General: No tenderness or deformity.  Lymphadenopathy:     Cervical: No cervical adenopathy.  Skin:    General: Skin is warm and dry.     Findings: No erythema or rash.  Neurological:     Mental Status: He is alert and oriented to person, place, and time.  Psychiatric:        Behavior: Behavior normal.        Thought Content: Thought content normal.        Judgment: Judgment normal.       Lab Results  Component Value Date   WBC 6.2 01/05/2019   HGB 15.2 01/05/2019   HCT 45.8 01/05/2019   MCV 88.1 01/05/2019   PLT 157 01/05/2019   Lab Results  Component Value Date   FERRITIN 95 10/01/2017    Lab Results  Component Value Date   RBC 5.20 01/05/2019   Lab Results  Component Value Date   KPAFRELGTCHN 28.2 (H) 05/05/2018   LAMBDASER 24.6 05/05/2018   KAPLAMBRATIO 5.75 05/09/2018   Lab Results  Component Value Date   IGGSERUM 799 05/05/2018   IGA 229 05/05/2018   IGMSERUM 136 05/05/2018   Lab Results  Component Value Date   TOTALPROTELP 6.6 05/05/2018   ALBUMINELP 3.4 05/05/2018   A1GS 0.3 05/05/2018   A2GS 1.1 (H) 05/05/2018   BETS 0.9 05/05/2018   BETA2SER 0.4 04/29/2018   GAMS 0.8 05/05/2018   MSPIKE Not Observed 05/05/2018   SPEI  04/29/2018     Comment:     . Evaluation is consistent with an acute inflammatory  pattern. . . A faint abnormal protein band is detected in the gamma globulins that most likely represents CRP, or circulating immune complexes, rather than a monoclonal immunoglobulin. . . Immunofixation analysis is available if identification of the band(s) is clinically indicated. .      Chemistry      Component Value Date/Time   NA 136 01/05/2019 1000   NA 140 11/25/2018 1055   K 3.6 01/05/2019 1000   CL 99 01/05/2019 1000   CO2 29 01/05/2019 1000   BUN 23 01/05/2019 1000   BUN 20 11/25/2018 1055   CREATININE 0.99 01/05/2019 1000   CREATININE 1.11 04/29/2018 1047      Component Value Date/Time   CALCIUM 10.0 01/05/2019 1000   ALKPHOS 93 01/05/2019 1000   AST 22 01/05/2019 1000   ALT 21 01/05/2019 1000   BILITOT 0.6 01/05/2019 1000      Impression and Plan: [name] [name] is a very pleasant 78 yo caucasian gentleman with metastatic kidney cancer. He also has history of prostate cancer and melanoma in situ.   We will go ahead with his pembrolizumab today.  I will plan for another set of scans on him in about 2 weeks.  Hopefully, we will see that he has a continued response.  I think his quality of life is doing much better.  This is quite encouraging.  I will see him back in 3 weeks.   [name], MD 4/16/202010:51 AM

## 2019-01-10 ENCOUNTER — Other Ambulatory Visit: Payer: Self-pay | Admitting: Cardiovascular Disease

## 2019-01-10 ENCOUNTER — Telehealth: Payer: Self-pay | Admitting: Hematology & Oncology

## 2019-01-10 NOTE — Telephone Encounter (Signed)
I called [facility] [phone] regarding patient's appt for CT's/  They stated they had LMVM for him on 4/17/  I then called patient who advised that he doesn't check his VM's very often.  I then gave him the phone number to schedule his CT @ [place].  H will call later today he stated

## 2019-01-12 ENCOUNTER — Ambulatory Visit (INDEPENDENT_AMBULATORY_CARE_PROVIDER_SITE_OTHER): Payer: Medicare Other | Admitting: Family Medicine

## 2019-01-12 ENCOUNTER — Other Ambulatory Visit: Payer: Self-pay

## 2019-01-12 ENCOUNTER — Encounter: Payer: Self-pay | Admitting: Family Medicine

## 2019-01-12 VITALS — Wt 160.0 lb

## 2019-01-12 DIAGNOSIS — R600 Localized edema: Secondary | ICD-10-CM

## 2019-01-12 DIAGNOSIS — R3911 Hesitancy of micturition: Secondary | ICD-10-CM | POA: Diagnosis not present

## 2019-01-12 DIAGNOSIS — I10 Essential (primary) hypertension: Secondary | ICD-10-CM

## 2019-01-12 DIAGNOSIS — C641 Malignant neoplasm of right kidney, except renal pelvis: Secondary | ICD-10-CM

## 2019-01-12 DIAGNOSIS — N401 Enlarged prostate with lower urinary tract symptoms: Secondary | ICD-10-CM

## 2019-01-12 NOTE — Progress Notes (Signed)
Denies and SOB. No pain associated with the swelling in his feet. He got up to quick and had a little bit of light headedness .Elouise Munroe, [place]

## 2019-01-12 NOTE — Progress Notes (Signed)
Virtual Visit via Video Note  I attemped to connect with [name] [name] on 01/12/19 at  4:20 PM EDT by a video enabled telemedicine application but he was unable to get it to work so we had to transition to a telephone visit. Verified that I am speaking with the correct person using two identifiers.   I discussed the limitations of evaluation and management by telemedicine and the availability of in person appointments. The patient expressed understanding and agreed to proceed.  Patient was at home and I was in my home office for this encounter.  Subjective:    CC: foot swelling.    HPI: 78 yo male actively undergoing tx for kidney cancer c/o foot swelling x 4 days in both feet. Worse on the Right, than left.  Has been trying to elevate feet. He is on 10 mg of amlodipine.  He has had this happen before but it is been years looking back through my notes and records we actually had filled a prescription for furosemide for ankle swelling back in 2013.  He says is, on because the swelling is mostly just on the top of his feet. Was given doxycycline for a tick bite but cause significant flushing of hands and face.  He stopped it after one dose.  He then got the medication confused with his doxazosin.  He accidentally stopped taking his Cardura about 3 weeks and was mistaken in doing so. He plans on restarting again today. His BP had been running high. For the last 2 weeks he hasn' t been urinating as well and then over the last 4 days noticed that he was starting to swell.  He denies any chest pain, shortness of breath or chest pain.   No CP or SOB. Last kidney function is stable.    Past medical history, Surgical history, Family history not pertinant except as noted below, Social history, Allergies, and medications have been entered into the medical record, reviewed, and corrections made.   Review of Systems: No fevers, chills, night sweats, weight loss, chest pain, or shortness of breath.    Objective:    General: Speaking clearly in complete sentences without any shortness of breath.  Alert and oriented x3.  Normal judgment. No apparent acute distress.    Impression and Recommendations:   Lower extremity swelling -he has had this happen years ago.  His most recent renal function was stable.  Discussed potential differential diagnosis including kidney problems, heart failure, thyroid problems.  He says he actually feels like it is actually a little better today than it was yesterday he wants to restart his Cardura first and see how he feels tomorrow.  We did discuss getting additional labs and work-up if his symptoms do not improve or if he worsens.  Based on his home scale he is up about 3 pounds I want him to weigh himself daily.  We can always call in a small quantity of furosemide to be used as needed he has done this before.  He is to call if he develops any new symptoms such as shortness of breath or chest pain.    Renal cancer-he is actually due for repeat CT scan with contrast tomorrow and has that scheduled.  BPH - he will restart his Doxazosin. Has been off for a month by mistake.  He sounds like he has had some difficulty urinating over the last couple of weeks but says it actually seem to be better today.  Should improve.  Hypertension-blood  pressures have not been well controlled over the last few weeks because he is been off of his Cardura.  This should improve pretty quickly with restarting his medication.     I discussed the assessment and treatment plan with the patient. The patient was provided an opportunity to ask questions and all were answered. The patient agreed with the plan and demonstrated an understanding of the instructions.   The patient was advised to call back or seek an in-person evaluation if the symptoms worsen or if the condition fails to improve as anticipated.   Beatrice Lecher, MD

## 2019-01-13 ENCOUNTER — Ambulatory Visit (INDEPENDENT_AMBULATORY_CARE_PROVIDER_SITE_OTHER): Payer: Medicare Other

## 2019-01-13 DIAGNOSIS — C641 Malignant neoplasm of right kidney, except renal pelvis: Secondary | ICD-10-CM | POA: Diagnosis not present

## 2019-01-13 DIAGNOSIS — C7951 Secondary malignant neoplasm of bone: Secondary | ICD-10-CM | POA: Diagnosis not present

## 2019-01-13 MED ORDER — IOHEXOL 300 MG/ML  SOLN
100.0000 mL | Freq: Once | INTRAMUSCULAR | Status: AC | PRN
  Administered 2019-01-13: 100 mL via INTRAVENOUS

## 2019-01-17 ENCOUNTER — Telehealth: Payer: Self-pay | Admitting: *Deleted

## 2019-01-17 NOTE — Telephone Encounter (Signed)
Unable to reach pt, lmovm with results. 

## 2019-01-17 NOTE — Telephone Encounter (Signed)
-----   Message from [name], MD sent at 01/17/2019  1:24 PM EDT ----- Call - cancer is still shrinking!!  Laurey Arrow

## 2019-01-23 ENCOUNTER — Telehealth: Payer: Self-pay | Admitting: Pharmacist

## 2019-01-23 NOTE — Telephone Encounter (Signed)
Oral Chemotherapy Pharmacist Encounter   Mr. Boyd called me to let me know he received a letter from [facility]. He wanted to know if he needed to do anything. I let him know that [facility] information has already been shared with [place]. He can keep the letter for his records.  Darl Pikes, PharmD, BCPS, [facility] Hematology/Oncology Clinical Pharmacist [facility] [phone]  01/23/2019 3:34 PM

## 2019-01-26 ENCOUNTER — Inpatient Hospital Stay: Payer: Medicare Other

## 2019-01-26 ENCOUNTER — Inpatient Hospital Stay (HOSPITAL_BASED_OUTPATIENT_CLINIC_OR_DEPARTMENT_OTHER): Payer: Medicare Other | Admitting: Hematology & Oncology

## 2019-01-26 ENCOUNTER — Encounter: Payer: Self-pay | Admitting: Hematology & Oncology

## 2019-01-26 ENCOUNTER — Inpatient Hospital Stay: Payer: Medicare Other | Attending: Hematology & Oncology

## 2019-01-26 ENCOUNTER — Other Ambulatory Visit: Payer: Self-pay

## 2019-01-26 ENCOUNTER — Other Ambulatory Visit: Payer: Self-pay | Admitting: *Deleted

## 2019-01-26 VITALS — BP 179/74 | HR 61 | Temp 97.8°F | Resp 19 | Wt 153.0 lb

## 2019-01-26 DIAGNOSIS — C7951 Secondary malignant neoplasm of bone: Secondary | ICD-10-CM | POA: Diagnosis not present

## 2019-01-26 DIAGNOSIS — C641 Malignant neoplasm of right kidney, except renal pelvis: Secondary | ICD-10-CM | POA: Diagnosis not present

## 2019-01-26 DIAGNOSIS — C61 Malignant neoplasm of prostate: Secondary | ICD-10-CM | POA: Diagnosis not present

## 2019-01-26 DIAGNOSIS — Z5112 Encounter for antineoplastic immunotherapy: Secondary | ICD-10-CM | POA: Insufficient documentation

## 2019-01-26 DIAGNOSIS — Z79899 Other long term (current) drug therapy: Secondary | ICD-10-CM

## 2019-01-26 DIAGNOSIS — Z923 Personal history of irradiation: Secondary | ICD-10-CM

## 2019-01-26 DIAGNOSIS — Z86007 Personal history of in-situ neoplasm of skin: Secondary | ICD-10-CM

## 2019-01-26 DIAGNOSIS — E032 Hypothyroidism due to medicaments and other exogenous substances: Secondary | ICD-10-CM

## 2019-01-26 DIAGNOSIS — Z8546 Personal history of malignant neoplasm of prostate: Secondary | ICD-10-CM

## 2019-01-26 LAB — CBC WITH DIFFERENTIAL (CANCER CENTER ONLY)
Abs Immature Granulocytes: 0.02 10*3/uL (ref 0.00–0.07)
Basophils Absolute: 0.1 10*3/uL (ref 0.0–0.1)
Basophils Relative: 1 %
Eosinophils Absolute: 0.2 10*3/uL (ref 0.0–0.5)
Eosinophils Relative: 3 %
HCT: 44.2 % (ref 39.0–52.0)
Hemoglobin: 14.6 g/dL (ref 13.0–17.0)
Immature Granulocytes: 0 %
Lymphocytes Relative: 23 %
Lymphs Abs: 1.4 10*3/uL (ref 0.7–4.0)
MCH: 29.1 pg (ref 26.0–34.0)
MCHC: 33 g/dL (ref 30.0–36.0)
MCV: 88.2 fL (ref 80.0–100.0)
Monocytes Absolute: 0.5 10*3/uL (ref 0.1–1.0)
Monocytes Relative: 7 %
Neutro Abs: 4.1 10*3/uL (ref 1.7–7.7)
Neutrophils Relative %: 66 %
Platelet Count: 174 10*3/uL (ref 150–400)
RBC: 5.01 MIL/uL (ref 4.22–5.81)
RDW: 15.2 % (ref 11.5–15.5)
WBC Count: 6.2 10*3/uL (ref 4.0–10.5)
nRBC: 0 % (ref 0.0–0.2)

## 2019-01-26 LAB — CMP (CANCER CENTER ONLY)
ALT: 19 U/L (ref 0–44)
AST: 20 U/L (ref 15–41)
Albumin: 3.7 g/dL (ref 3.5–5.0)
Alkaline Phosphatase: 100 U/L (ref 38–126)
Anion gap: 8 (ref 5–15)
BUN: 20 mg/dL (ref 8–23)
CO2: 30 mmol/L (ref 22–32)
Calcium: 9.6 mg/dL (ref 8.9–10.3)
Chloride: 102 mmol/L (ref 98–111)
Creatinine: 1.26 mg/dL — ABNORMAL HIGH (ref 0.61–1.24)
GFR, Est AFR Am: >60 mL/min (ref >60)
GFR, Estimated: 55 mL/min — ABNORMAL LOW (ref >60)
Glucose, Bld: 119 mg/dL — ABNORMAL HIGH (ref 70–99)
Potassium: 3.7 mmol/L (ref 3.5–5.1)
Sodium: 140 mmol/L (ref 135–145)
Total Bilirubin: 0.6 mg/dL (ref 0.3–1.2)
Total Protein: 6.6 g/dL (ref 6.5–8.1)

## 2019-01-26 LAB — LACTATE DEHYDROGENASE: LDH: 242 U/L — ABNORMAL HIGH (ref 98–192)

## 2019-01-26 MED ORDER — SODIUM CHLORIDE 0.9 % IV SOLN
Freq: Once | INTRAVENOUS | Status: AC
  Administered 2019-01-26: 11:00:00 via INTRAVENOUS
  Filled 2019-01-26: qty 250

## 2019-01-26 MED ORDER — SODIUM CHLORIDE 0.9 % IV SOLN
200.0000 mg | Freq: Once | INTRAVENOUS | Status: AC
  Administered 2019-01-26: 200 mg via INTRAVENOUS
  Filled 2019-01-26: qty 8

## 2019-01-26 NOTE — Progress Notes (Signed)
Hematology and Oncology Follow Up Visit  [name] [name] [id number] 1941-01-10 78 y.o. 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  So far, he is doing quite well.  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  We did do a CT scan on him.  This was done on 01/13/2019.  The CT scan showed continued improvement in his disease.  He has had no problems with the Inlyta.  His blood pressure is little on the high side.  He says he takes it at home and the pressure is not nearly as high as it is when he gets here.  He still has issues with his hip.  He is on the oxycodone which helps quite a bit.  This is making him quite functional and always allow him to work which is incredibly important to him.  He has had no problems with bowels or bladder.  There is been no bleeding.  He has had no issues with leg swelling.  He has had no cough.  He has had no headache.  Overall, his performance status is ECOG 1.   Medications:  Allergies as of 01/26/2019      Reactions   Doxycycline Other (See Comments)   Flushing of hands and face.       Medication List       Accurate as of Jan 26, 2019 10:38 AM. If you have any questions, ask your nurse or doctor.        amLODipine 10 MG tablet Commonly known as:  NORVASC TAKE 1 TABLET(10 MG) BY MOUTH DAILY   Anoro Ellipta 62.5-25 MCG/INH Aepb Generic drug:  umeclidinium-vilanterol INHALE 1 PUFF BY MOUTH EVERY DAY   clopidogrel 75 MG tablet Commonly known as:  PLAVIX TAKE 1 TABLET BY MOUTH EVERY DAY   CONTOUR BLOOD GLUCOSE SYSTEM Devi Diagnoses - Diabetes E 11.8 Check blood sugar  once daily   doxazosin 4 MG tablet Commonly known as:  CARDURA Take 4 mg by mouth.   gabapentin 300 MG capsule Commonly known as:  NEURONTIN One tab PO qHS for a week, then BID for a week, then TID. May double weekly to a max of 3,676m/day   glucose blood test strip Commonly known as:  BEstate manager/land agentDiagnoses - Diabetes E 11.8 Check blood sugar once daily   Inlyta 5 MG tablet Generic drug:  axitinib TAKE 1 TABLET (5MG) BY MOUTH TWICE DAILY FOR 21 DAYS ON, THEN 7 DAYS OFF.   levothyroxine 50 MCG tablet Commonly known as:  Synthroid Take 1 tablet (50 mcg total) by mouth daily before breakfast.   losartan 100 MG tablet Commonly known as:  COZAAR TAKE 1 TABLET BY MOUTH EVERY DAY   metFORMIN 500 MG 24 hr tablet Commonly known as:  GLUCOPHAGE-XR TAKE 2 TABLETS BY MOUTH EVERY DAY WITH BREAKFAST   metoprolol succinate 100 MG 24 hr tablet Commonly known as:  TOPROL-XL TAKE 1 TABLET BY MOUTH EVERY DAY   multivitamin with minerals tablet Take 1 tablet by mouth daily.   Oxycodone HCl 10 MG Tabs Take 1  tablet (10 mg total) by mouth every 6 (six) hours as needed for severe pain.   ProAir HFA 108 (90 Base) MCG/ACT inhaler Generic drug:  albuterol INHALE 2 PUFFS INTO THE LUNGS EVERY 6 HOURS AS NEEDED FOR WHEEZING   prochlorperazine 10 MG tablet Commonly known as:  COMPAZINE TAKE 1 TABLET(10 MG) BY MOUTH EVERY 6 HOURS AS NEEDED FOR NAUSEA OR VOMITING   Xtampza ER 13.5 MG C12a Generic drug:  oxyCODONE ER TK 1 C PO Q 12 H       Allergies:  Allergies  Allergen Reactions   Doxycycline Other (See Comments)    Flushing of hands and face.     Past Medical History, Surgical history, Social history, and Family History were reviewed and updated.  Review of Systems: Review of Systems  Constitutional: Negative.   HENT: Negative.   Eyes: Negative.   Respiratory: Negative.   Cardiovascular: Negative.   Gastrointestinal: Negative.   Genitourinary: Negative.     Musculoskeletal: Negative.   Skin: Negative.   Neurological: Negative.   Endo/Heme/Allergies: Negative.   Psychiatric/Behavioral: Negative.      Physical Exam:  weight is 153 lb (69.4 kg). His oral temperature is 97.8 F (36.6 C). His blood pressure is 179/74 (abnormal) and his pulse is 61. His respiration is 19 and oxygen saturation is 100%.   Wt Readings from Last 3 Encounters:  01/26/19 153 lb (69.4 kg)  01/12/19 160 lb (72.6 kg)  01/05/19 151 lb (68.5 kg)    Physical Exam Vitals signs reviewed.  HENT:     Head: Normocephalic and atraumatic.  Eyes:     Pupils: Pupils are equal, round, and reactive to light.  Neck:     Musculoskeletal: Normal range of motion.  Cardiovascular:     Rate and Rhythm: Normal rate and regular rhythm.     Heart sounds: Normal heart sounds.  Pulmonary:     Effort: Pulmonary effort is normal.     Breath sounds: Normal breath sounds.  Abdominal:     General: Bowel sounds are normal.     Palpations: Abdomen is soft.     Comments: Abdominal exam shows the tic wound in the left lower quadrant.  Is erythematous.  Is slightly firm.  It probably measures about 1 cm x 1.5 cm.  There is no hepatomegaly.  There is no splenomegaly.  Musculoskeletal: Normal range of motion.        General: No tenderness or deformity.  Lymphadenopathy:     Cervical: No cervical adenopathy.  Skin:    General: Skin is warm and dry.     Findings: No erythema or rash.  Neurological:     Mental Status: He is alert and oriented to person, place, and time.  Psychiatric:        Behavior: Behavior normal.        Thought Content: Thought content normal.        Judgment: Judgment normal.       Lab Results  Component Value Date   WBC 6.2 01/26/2019   HGB 14.6 01/26/2019   HCT 44.2 01/26/2019   MCV 88.2 01/26/2019   PLT 174 01/26/2019   Lab Results  Component Value Date   FERRITIN 95 10/01/2017   Lab Results  Component Value Date   RBC 5.01 01/26/2019   Lab  Results  Component Value Date   KPAFRELGTCHN 28.2 (H) 05/05/2018   LAMBDASER 24.6 05/05/2018   KAPLAMBRATIO 5.75 05/09/2018   Lab Results  Component Value Date  IGGSERUM 799 05/05/2018   IGA 229 05/05/2018   IGMSERUM 136 05/05/2018   Lab Results  Component Value Date   TOTALPROTELP 6.6 05/05/2018   ALBUMINELP 3.4 05/05/2018   A1GS 0.3 05/05/2018   A2GS 1.1 (H) 05/05/2018   BETS 0.9 05/05/2018   BETA2SER 0.4 04/29/2018   GAMS 0.8 05/05/2018   MSPIKE Not Observed 05/05/2018   SPEI  04/29/2018     Comment:     . Evaluation is consistent with an acute inflammatory  pattern. . . A faint abnormal protein band is detected in the gamma globulins that most likely represents CRP, or circulating immune complexes, rather than a monoclonal immunoglobulin. . . Immunofixation analysis is available if identification of the band(s) is clinically indicated. .      Chemistry      Component Value Date/Time   NA 136 01/05/2019 1000   NA 140 11/25/2018 1055   K 3.6 01/05/2019 1000   CL 99 01/05/2019 1000   CO2 29 01/05/2019 1000   BUN 23 01/05/2019 1000   BUN 20 11/25/2018 1055   CREATININE 0.99 01/05/2019 1000   CREATININE 1.11 04/29/2018 1047      Component Value Date/Time   CALCIUM 10.0 01/05/2019 1000   ALKPHOS 93 01/05/2019 1000   AST 22 01/05/2019 1000   ALT 21 01/05/2019 1000   BILITOT 0.6 01/05/2019 1000      Impression and Plan: [name] [name] is a very pleasant 78 yo caucasian gentleman with metastatic kidney cancer. He also has history of prostate cancer and melanoma in situ.   We will go ahead with his pembrolizumab today.  I am so glad that he is done well.  We will go ahead with another follow-up in about 3 weeks.  We probably do not need another scan until July or August.  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  [name], MD 5/7/202010:38 AM

## 2019-01-26 NOTE — Progress Notes (Signed)
OK to treat with creat-1.26 per order of Dr. Marin Olp.

## 2019-01-27 ENCOUNTER — Telehealth: Payer: Self-pay | Admitting: *Deleted

## 2019-01-27 LAB — TSH: TSH: 21.82 u[IU]/mL — ABNORMAL HIGH (ref 0.320–4.118)

## 2019-01-27 MED ORDER — LEVOTHYROXINE SODIUM 75 MCG PO TABS: 75.0000 ug | ORAL_TABLET | Freq: Every day | ORAL | 4 refills | Status: DC

## 2019-01-27 NOTE — Addendum Note (Signed)
Addended by: Burney Gauze R on: 01/27/2019 11:04 AM   Modules accepted: Orders

## 2019-01-27 NOTE — Telephone Encounter (Addendum)
Patient aware of results and new prescription. Pharmacy confirmed.   ----- Message from [name], MD sent at 01/27/2019 11:00 AM EDT ----- Call - the thyroid is not working,  Need to increase the SYnthroid to 75 mcg po q day.  I will send in to his pharmacy.  pete

## 2019-01-31 ENCOUNTER — Ambulatory Visit: Payer: Medicare Other | Admitting: Cardiology

## 2019-02-03 MED FILL — INLYTA 5 MG TABLET: 5 | 28 days supply | Qty: 42 | Fill #3

## 2019-02-07 ENCOUNTER — Other Ambulatory Visit: Payer: Self-pay | Admitting: *Deleted

## 2019-02-07 ENCOUNTER — Other Ambulatory Visit: Payer: Self-pay | Admitting: Family Medicine

## 2019-02-07 MED ORDER — OXYCODONE HCL 10 MG PO TABS: 10.0000 mg | ORAL_TABLET | Freq: Four times a day (QID) | ORAL | 0 refills | Status: DC | PRN

## 2019-02-16 ENCOUNTER — Encounter: Payer: Self-pay | Admitting: Hematology & Oncology

## 2019-02-16 ENCOUNTER — Other Ambulatory Visit: Payer: Self-pay

## 2019-02-16 ENCOUNTER — Inpatient Hospital Stay (HOSPITAL_BASED_OUTPATIENT_CLINIC_OR_DEPARTMENT_OTHER): Payer: Medicare Other | Admitting: Hematology & Oncology

## 2019-02-16 ENCOUNTER — Inpatient Hospital Stay: Payer: Medicare Other

## 2019-02-16 VITALS — BP 207/77 | HR 58 | Temp 98.7°F | Resp 16 | Wt 157.0 lb

## 2019-02-16 VITALS — BP 207/73 | HR 54 | Resp 18

## 2019-02-16 DIAGNOSIS — C7951 Secondary malignant neoplasm of bone: Secondary | ICD-10-CM

## 2019-02-16 DIAGNOSIS — C641 Malignant neoplasm of right kidney, except renal pelvis: Secondary | ICD-10-CM

## 2019-02-16 DIAGNOSIS — Z86007 Personal history of in-situ neoplasm of skin: Secondary | ICD-10-CM

## 2019-02-16 DIAGNOSIS — Z8546 Personal history of malignant neoplasm of prostate: Secondary | ICD-10-CM

## 2019-02-16 DIAGNOSIS — Z923 Personal history of irradiation: Secondary | ICD-10-CM

## 2019-02-16 DIAGNOSIS — I10 Essential (primary) hypertension: Secondary | ICD-10-CM | POA: Diagnosis not present

## 2019-02-16 DIAGNOSIS — Z79899 Other long term (current) drug therapy: Secondary | ICD-10-CM | POA: Diagnosis not present

## 2019-02-16 DIAGNOSIS — Z5112 Encounter for antineoplastic immunotherapy: Secondary | ICD-10-CM | POA: Diagnosis not present

## 2019-02-16 LAB — CBC WITH DIFFERENTIAL (CANCER CENTER ONLY)
Abs Immature Granulocytes: 0.02 10*3/uL (ref 0.00–0.07)
Basophils Absolute: 0.1 10*3/uL (ref 0.0–0.1)
Basophils Relative: 1 %
Eosinophils Absolute: 0.7 10*3/uL — ABNORMAL HIGH (ref 0.0–0.5)
Eosinophils Relative: 11 %
HCT: 42.6 % (ref 39.0–52.0)
Hemoglobin: 13.9 g/dL (ref 13.0–17.0)
Immature Granulocytes: 0 %
Lymphocytes Relative: 21 %
Lymphs Abs: 1.3 10*3/uL (ref 0.7–4.0)
MCH: 29.4 pg (ref 26.0–34.0)
MCHC: 32.6 g/dL (ref 30.0–36.0)
MCV: 90.3 fL (ref 80.0–100.0)
Monocytes Absolute: 0.4 10*3/uL (ref 0.1–1.0)
Monocytes Relative: 7 %
Neutro Abs: 3.8 10*3/uL (ref 1.7–7.7)
Neutrophils Relative %: 60 %
Platelet Count: 172 10*3/uL (ref 150–400)
RBC: 4.72 MIL/uL (ref 4.22–5.81)
RDW: 14.9 % (ref 11.5–15.5)
WBC Count: 6.4 10*3/uL (ref 4.0–10.5)
nRBC: 0 % (ref 0.0–0.2)

## 2019-02-16 LAB — CMP (CANCER CENTER ONLY)
ALT: 14 U/L (ref 0–44)
AST: 18 U/L (ref 15–41)
Albumin: 3.8 g/dL (ref 3.5–5.0)
Alkaline Phosphatase: 99 U/L (ref 38–126)
Anion gap: 9 (ref 5–15)
BUN: 23 mg/dL (ref 8–23)
CO2: 31 mmol/L (ref 22–32)
Calcium: 9.4 mg/dL (ref 8.9–10.3)
Chloride: 100 mmol/L (ref 98–111)
Creatinine: 1.16 mg/dL (ref 0.61–1.24)
GFR, Est AFR Am: >60 mL/min (ref >60)
GFR, Estimated: >60 mL/min (ref >60)
Glucose, Bld: 129 mg/dL — ABNORMAL HIGH (ref 70–99)
Potassium: 4.6 mmol/L (ref 3.5–5.1)
Sodium: 140 mmol/L (ref 135–145)
Total Bilirubin: 0.6 mg/dL (ref 0.3–1.2)
Total Protein: 6.8 g/dL (ref 6.5–8.1)

## 2019-02-16 LAB — LACTATE DEHYDROGENASE: LDH: 185 U/L (ref 98–192)

## 2019-02-16 MED ORDER — SODIUM CHLORIDE 0.9 % IV SOLN
Freq: Once | INTRAVENOUS | Status: AC
  Administered 2019-02-16: 13:00:00 via INTRAVENOUS
  Filled 2019-02-16: qty 250

## 2019-02-16 MED ORDER — HYDRALAZINE HCL 25 MG PO TABS: 25.0000 mg | ORAL_TABLET | Freq: Three times a day (TID) | ORAL | 4 refills | Status: DC

## 2019-02-16 MED ORDER — SODIUM CHLORIDE 0.9 % IV SOLN
200.0000 mg | Freq: Once | INTRAVENOUS | Status: AC
  Administered 2019-02-16: 200 mg via INTRAVENOUS
  Filled 2019-02-16: qty 8

## 2019-02-16 NOTE — Patient Instructions (Signed)
PemPembrolizumab injection What is this medicine? PEMBROLIZUMAB (pem broe liz ue mab) is a monoclonal antibody. It is used to treat cervical cancer, esophageal cancer, head and neck cancer, hepatocellular cancer, Hodgkin lymphoma, kidney cancer, lymphoma, melanoma, Merkel cell carcinoma, lung cancer, stomach cancer, urothelial cancer, and cancers that have a certain genetic condition. This medicine may be used for other purposes; ask your health care provider or pharmacist if you have questions. COMMON BRAND NAME(S): Keytruda What should I tell my health care provider before I take this medicine? They need to know if you have any of these conditions: -diabetes -immune system problems -inflammatory bowel disease -liver disease -lung or breathing disease -lupus -received or scheduled to receive an organ transplant or a stem-cell transplant that uses donor stem cells -an unusual or allergic reaction to pembrolizumab, other medicines, foods, dyes, or preservatives -pregnant or trying to get pregnant -breast-feeding How should I use this medicine? This medicine is for infusion into a vein. It is given by a health care professional in a hospital or clinic setting. A special MedGuide will be given to you before each treatment. Be sure to read this information carefully each time. Talk to your pediatrician regarding the use of this medicine in children. While this drug may be prescribed for selected conditions, precautions do apply. Overdosage: If you think you have taken too much of this medicine contact a poison control center or emergency room at once. NOTE: This medicine is only for you. Do not share this medicine with others. What if I miss a dose? It is important not to miss your dose. Call your doctor or health care professional if you are unable to keep an appointment. What may interact with this medicine? Interactions have not been studied. Give your health care provider a list of all the  medicines, herbs, non-prescription drugs, or dietary supplements you use. Also tell them if you smoke, drink alcohol, or use illegal drugs. Some items may interact with your medicine. This list may not describe all possible interactions. Give your health care provider a list of all the medicines, herbs, non-prescription drugs, or dietary supplements you use. Also tell them if you smoke, drink alcohol, or use illegal drugs. Some items may interact with your medicine. What should I watch for while using this medicine? Your condition will be monitored carefully while you are receiving this medicine. You may need blood work done while you are taking this medicine. Do not become pregnant while taking this medicine or for 4 months after stopping it. Women should inform their doctor if they wish to become pregnant or think they might be pregnant. There is a potential for serious side effects to an unborn child. Talk to your health care professional or pharmacist for more information. Do not breast-feed an infant while taking this medicine or for 4 months after the last dose. What side effects may I notice from receiving this medicine? Side effects that you should report to your doctor or health care professional as soon as possible: -allergic reactions like skin rash, itching or hives, swelling of the face, lips, or tongue -bloody or black, tarry -breathing problems -changes in vision -chest pain -chills -confusion -constipation -cough -diarrhea -dizziness or feeling faint or lightheaded -fast or irregular heartbeat -fever -flushing -hair loss -joint pain -low blood counts - this medicine may decrease the number of white blood cells, red blood cells and platelets. You may be at increased risk for infections and bleeding. -muscle pain -muscle weakness -persistent headache -redness,  blistering, peeling or loosening of the skin, including inside the mouth -signs and symptoms of high blood sugar  such as dizziness; dry mouth; dry skin; fruity breath; nausea; stomach pain; increased hunger or thirst; increased urination -signs and symptoms of kidney injury like trouble passing urine or change in the amount of urine -signs and symptoms of liver injury like dark urine, light-colored stools, loss of appetite, nausea, right upper belly pain, yellowing of the eyes or skin -sweating -swollen lymph nodes -weight loss Side effects that usually do not require medical attention (report to your doctor or health care professional if they continue or are bothersome): -decreased appetite -muscle pain -tiredness This list may not describe all possible side effects. Call your doctor for medical advice about side effects. You may report side effects to FDA at 1-800-FDA-1088. Where should I keep my medicine? This drug is given in a hospital or clinic and will not be stored at home. NOTE: This sheet is a summary. It may not cover all possible information. If you have questions about this medicine, talk to your doctor, pharmacist, or health care provider.  2019 Elsevier/Gold Standard (2018-04-21 15:06:10)

## 2019-02-16 NOTE — Progress Notes (Signed)
Hematology and Oncology Follow Up Visit  [name] [name] [id number] 1941-07-22 78 y.o. 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  His problem now is his blood pressure is quite high.  He is already taking several blood pressure medications.  We will have to add another one.  I will see him try him on hydralazine.  Hopefully this will get his blood pressure down to a more safe level.  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  Hopefully, this will lighten up and he will be able to sell some rope in the future.  He is eating okay.  He is having no problems with nausea or vomiting.  There is no diarrhea.  He has had no leg swelling.  He does have the hip pain which is been chronic.  He has had no mouth sores.  He has had no headache.  There is no visual changes.  Overall, his performance status is ECOG 1.   Medications:  Allergies as of 02/16/2019      Reactions   Doxycycline Other (See Comments)   Flushing of hands and face.       Medication List       Accurate as of Feb 16, 2019 11:51 AM. If you have any questions, ask your nurse or doctor.        amLODipine 10 MG tablet Commonly known as:  NORVASC TAKE 1 TABLET(10 MG) BY MOUTH DAILY   Anoro Ellipta 62.5-25 MCG/INH Aepb Generic drug:  umeclidinium-vilanterol INHALE 1 PUFF BY MOUTH EVERY DAY   clopidogrel 75 MG tablet Commonly known as:  PLAVIX TAKE 1 TABLET BY MOUTH EVERY DAY   CONTOUR BLOOD GLUCOSE SYSTEM Devi Diagnoses - Diabetes E 11.8 Check blood sugar once daily   doxazosin 4 MG tablet Commonly known as:  CARDURA Take 4 mg by mouth.   gabapentin 300 MG capsule Commonly  known as:  NEURONTIN One tab PO qHS for a week, then BID for a week, then TID. May double weekly to a max of 3,628m/day. MUST SCHEDULE APPT   glucose blood test strip Commonly known as:  BEstate manager/land agentDiagnoses - Diabetes E 11.8 Check blood sugar once daily   Inlyta 5 MG tablet Generic drug:  axitinib TAKE 1 TABLET (5MG) BY MOUTH TWICE DAILY FOR 21 DAYS ON, THEN 7 DAYS OFF.   levothyroxine 75 MCG tablet Commonly known as:  Synthroid Take 1 tablet (75 mcg total) by mouth daily before breakfast.   losartan 100 MG tablet Commonly known as:  COZAAR TAKE 1 TABLET BY MOUTH EVERY DAY   metFORMIN 500 MG 24 hr tablet Commonly known as:  GLUCOPHAGE-XR TAKE 2 TABLETS BY MOUTH EVERY DAY WITH BREAKFAST   metoprolol succinate 100 MG 24 hr tablet Commonly known as:  TOPROL-XL TAKE 1 TABLET BY MOUTH EVERY DAY   multivitamin with minerals tablet Take 1 tablet by mouth daily.   Oxycodone HCl 10 MG Tabs Take 1 tablet (10 mg total) by mouth every 6 (six) hours as needed.   ProAir HFA 108 (90 Base) MCG/ACT inhaler Generic drug:  albuterol INHALE 2 PUFFS INTO THE LUNGS EVERY 6  HOURS AS NEEDED FOR WHEEZING   prochlorperazine 10 MG tablet Commonly known as:  COMPAZINE TAKE 1 TABLET(10 MG) BY MOUTH EVERY 6 HOURS AS NEEDED FOR NAUSEA OR VOMITING   Xtampza ER 13.5 MG C12a Generic drug:  oxyCODONE ER TK 1 C PO Q 12 H       Allergies:  Allergies  Allergen Reactions  . Doxycycline Other (See Comments)    Flushing of hands and face.     Past Medical History, Surgical history, Social history, and Family History were reviewed and updated.  Review of Systems: Review of Systems  Constitutional: Negative.   HENT: Negative.   Eyes: Negative.   Respiratory: Negative.   Cardiovascular: Negative.   Gastrointestinal: Negative.   Genitourinary: Negative.   Musculoskeletal: Negative.   Skin: Negative.   Neurological: Negative.   Endo/Heme/Allergies: Negative.    Psychiatric/Behavioral: Negative.      Physical Exam:  weight is 157 lb (71.2 kg). His oral temperature is 98.7 F (37.1 C). His blood pressure is 207/77 (abnormal) and his pulse is 58 (abnormal). His respiration is 16 and oxygen saturation is 100%.   Wt Readings from Last 3 Encounters:  02/16/19 157 lb (71.2 kg)  01/26/19 153 lb (69.4 kg)  01/12/19 160 lb (72.6 kg)    Physical Exam Vitals signs reviewed.  HENT:     Head: Normocephalic and atraumatic.  Eyes:     Pupils: Pupils are equal, round, and reactive to light.  Neck:     Musculoskeletal: Normal range of motion.  Cardiovascular:     Rate and Rhythm: Normal rate and regular rhythm.     Heart sounds: Normal heart sounds.  Pulmonary:     Effort: Pulmonary effort is normal.     Breath sounds: Normal breath sounds.  Abdominal:     General: Bowel sounds are normal.     Palpations: Abdomen is soft.     Comments: Abdominal exam shows the tic wound in the left lower quadrant.  Is erythematous.  Is slightly firm.  It probably measures about 1 cm x 1.5 cm.  There is no hepatomegaly.  There is no splenomegaly.  Musculoskeletal: Normal range of motion.        General: No tenderness or deformity.  Lymphadenopathy:     Cervical: No cervical adenopathy.  Skin:    General: Skin is warm and dry.     Findings: No erythema or rash.  Neurological:     Mental Status: He is alert and oriented to person, place, and time.  Psychiatric:        Behavior: Behavior normal.        Thought Content: Thought content normal.        Judgment: Judgment normal.       Lab Results  Component Value Date   WBC 6.4 02/16/2019   HGB 13.9 02/16/2019   HCT 42.6 02/16/2019   MCV 90.3 02/16/2019   PLT 172 02/16/2019   Lab Results  Component Value Date   FERRITIN 95 10/01/2017   Lab Results  Component Value Date   RBC 4.72 02/16/2019   Lab Results  Component Value Date   KPAFRELGTCHN 28.2 (H) 05/05/2018   LAMBDASER 24.6 05/05/2018    KAPLAMBRATIO 5.75 05/09/2018   Lab Results  Component Value Date   IGGSERUM 799 05/05/2018   IGA 229 05/05/2018   IGMSERUM 136 05/05/2018   Lab Results  Component Value Date   TOTALPROTELP 6.6 05/05/2018   ALBUMINELP 3.4 05/05/2018   A1GS 0.3  05/05/2018   A2GS 1.1 (H) 05/05/2018   BETS 0.9 05/05/2018   BETA2SER 0.4 04/29/2018   GAMS 0.8 05/05/2018   MSPIKE Not Observed 05/05/2018   SPEI  04/29/2018     Comment:     . Evaluation is consistent with an acute inflammatory  pattern. . . A faint abnormal protein band is detected in the gamma globulins that most likely represents CRP, or circulating immune complexes, rather than a monoclonal immunoglobulin. . . Immunofixation analysis is available if identification of the band(s) is clinically indicated. .      Chemistry      Component Value Date/Time   NA 140 02/16/2019 0938   NA 140 11/25/2018 1055   K 4.6 02/16/2019 0938   CL 100 02/16/2019 0938   CO2 31 02/16/2019 0938   BUN 23 02/16/2019 0938   BUN 20 11/25/2018 1055   CREATININE 1.16 02/16/2019 0938   CREATININE 1.11 04/29/2018 1047      Component Value Date/Time   CALCIUM 9.4 02/16/2019 0938   ALKPHOS 99 02/16/2019 0938   AST 18 02/16/2019 0938   ALT 14 02/16/2019 0938   BILITOT 0.6 02/16/2019 0938      Impression and Plan: [name] [name] is a very pleasant 78 yo caucasian gentleman with metastatic kidney cancer. He also has history of prostate cancer and melanoma in situ.   We will go ahead with his pembrolizumab today.  We will proceed with his treatments.  I will see a problem with him getting the pembrolizumab.  Hopefully, hydralazine will get his blood pressure down a little bit better.  We will have him come back in another 3 weeks.  If he cannot get his blood pressure down, we may have to send him back to his cardiologist.  [name], MD 5/28/202011:51 AM

## 2019-02-17 ENCOUNTER — Other Ambulatory Visit: Payer: Self-pay | Admitting: Family

## 2019-02-17 ENCOUNTER — Other Ambulatory Visit: Payer: Self-pay | Admitting: Family Medicine

## 2019-02-17 DIAGNOSIS — E032 Hypothyroidism due to medicaments and other exogenous substances: Secondary | ICD-10-CM

## 2019-02-20 ENCOUNTER — Ambulatory Visit (INDEPENDENT_AMBULATORY_CARE_PROVIDER_SITE_OTHER): Payer: Medicare Other | Admitting: Family Medicine

## 2019-02-20 ENCOUNTER — Encounter: Payer: Self-pay | Admitting: Family Medicine

## 2019-02-20 VITALS — BP 146/66 | HR 62 | Ht 70.0 in | Wt 158.0 lb

## 2019-02-20 DIAGNOSIS — E119 Type 2 diabetes mellitus without complications: Secondary | ICD-10-CM | POA: Diagnosis not present

## 2019-02-20 DIAGNOSIS — I10 Essential (primary) hypertension: Secondary | ICD-10-CM | POA: Diagnosis not present

## 2019-02-20 LAB — POCT GLYCOSYLATED HEMOGLOBIN (HGB A1C): Hemoglobin A1C: 5.8 % — AB (ref 4.0–5.6)

## 2019-02-20 NOTE — Assessment & Plan Note (Signed)
Once he looks absolutely fantastic today.  Still encouraged him to stick with a healthy diet I am proud of him for his weight loss and he is actually says he feels much better since losing some of the weight.

## 2019-02-20 NOTE — Progress Notes (Signed)
Established Patient Office Visit  Subjective:  Patient ID: [name] [name], male    DOB: Dec 20, 1940  Age: 78 y.o. MRN: [id number]  CC:  Chief Complaint  Patient presents with  . Hypertension    HPI [name] [name] presents for elevated BP.  He saw dr. Marin Olp on 02/16/2019.  Bc his BP was uncontrolled, he was started on hydralazine 5 mg 3 times a day.  He is actually been doing really well without any particular side effects or problems with it.  Diabetes-he also reports that his glucose was a little elevated as well.  Says he admits he started eating cinnamon buns and sweets as he had been losing some weight and was trying to actually gain a little bit of weight.  But knows he was eating things he probably should not be..    Past Medical History:  Diagnosis Date  . Allergic rhinitis   . Bigeminy   . Bradycardia   . Bruit   . CAD (coronary artery disease)   . Claudication ([place])   . Diabetes mellitus type II   . Dyspnea   . Goals of care, counseling/discussion 06/02/2018  . History of prostate cancer    Dr. Gaynelle Arabian  . Hypercholesteremia    II A  . Hypertension   . Kidney cancer, primary, with metastasis from kidney to other site, right ([place]) 06/02/2018  . Malignant neoplasm of prostate ([place])   . Microalbuminuria   . Seborrheic keratosis     Past Surgical History:  Procedure Laterality Date  . CORONARY ANGIOPLASTY WITH STENT PLACEMENT    . IR FLUORO RM 30-60 MIN  05/24/2018    Family History  Problem Relation Age of Onset  . Heart attack Father        Acute MI  . Coronary artery disease Brother     Social History   Socioeconomic History  . Marital status: Widowed    Spouse name: Not on file  . Number of children: Not on file  . Years of education: Not on file  . Highest education level: Not on file  Occupational History  . Occupation: Retired but still works Dentist: RETIRED  Social Needs  . Financial resource strain: Not on file  . Food  insecurity:    Worry: Not on file    Inability: Not on file  . Transportation needs:    Medical: Not on file    Non-medical: Not on file  Tobacco Use  . Smoking status: Former Smoker    Packs/day: 1.00    Years: 20.00    Pack years: 20.00    Last attempt to quit: 04/22/2003    Years since quitting: 15.8  . Smokeless tobacco: Never Used  Substance and Sexual Activity  . Alcohol use: Not Currently    Alcohol/week: 1.0 standard drinks    Types: 1 Standard drinks or equivalent per week  . Drug use: Never  . Sexual activity: Not Currently  Lifestyle  . Physical activity:    Days per week: Not on file    Minutes per session: Not on file  . Stress: Not on file  Relationships  . Social connections:    Talks on phone: Not on file    Gets together: Not on file    Attends religious service: Not on file    Active member of club or organization: Not on file    Attends meetings of clubs or organizations: Not on file  Relationship status: Not on file  . Intimate partner violence:    Fear of current or ex partner: Not on file    Emotionally abused: Not on file    Physically abused: Not on file    Forced sexual activity: Not on file  Other Topics Concern  . Not on file  Social History Narrative   Widowed 08/2009   Has 4 kids, 2 are local   Fair diet   No regular exercise    Outpatient Medications Prior to Visit  Medication Sig Dispense Refill  . amLODipine (NORVASC) 10 MG tablet TAKE 1 TABLET BY MOUTH DAILY 90 tablet 1  . ANORO ELLIPTA 62.5-25 MCG/INH AEPB INHALE 1 PUFF BY MOUTH EVERY DAY 60 each 3  . Blood Glucose Monitoring Suppl (CONTOUR BLOOD GLUCOSE SYSTEM) DEVI Diagnoses - Diabetes E 11.8 Check blood sugar once daily 1 Device 0  . clopidogrel (PLAVIX) 75 MG tablet TAKE 1 TABLET BY MOUTH EVERY DAY 90 tablet 3  . doxazosin (CARDURA) 4 MG tablet Take 4 mg by mouth.    . gabapentin (NEURONTIN) 300 MG capsule One tab PO qHS for a week, then BID for a week, then TID. May double  weekly to a max of 3,600mg /day. MUST SCHEDULE APPT 90 capsule 0  . glucose blood (BAYER CONTOUR TEST) test strip Diagnoses - Diabetes E 11.8 Check blood sugar once daily 100 each prn  . hydrALAZINE (APRESOLINE) 25 MG tablet Take 1 tablet (25 mg total) by mouth 3 (three) times daily. 90 tablet 4  . INLYTA 5 MG tablet TAKE 1 TABLET (5MG ) BY MOUTH TWICE DAILY FOR 21 DAYS ON, THEN 7 DAYS OFF. 42 tablet 3  . levothyroxine (SYNTHROID) 50 MCG tablet TAKE 1 TABLET(50 MCG) BY MOUTH DAILY BEFORE BREAKFAST 30 tablet 3  . levothyroxine (SYNTHROID) 75 MCG tablet Take 1 tablet (75 mcg total) by mouth daily before breakfast. 30 tablet 4  . losartan (COZAAR) 100 MG tablet TAKE 1 TABLET BY MOUTH EVERY DAY 90 tablet 3  . metFORMIN (GLUCOPHAGE-XR) 500 MG 24 hr tablet TAKE 2 TABLETS BY MOUTH EVERY DAY WITH BREAKFAST 180 tablet 1  . metoprolol succinate (TOPROL-XL) 100 MG 24 hr tablet TAKE 1 TABLET BY MOUTH EVERY DAY 90 tablet 1  . Multiple Vitamins-Minerals (MULTIVITAMIN WITH MINERALS) tablet Take 1 tablet by mouth daily.    . Oxycodone HCl 10 MG TABS Take 1 tablet (10 mg total) by mouth every 6 (six) hours as needed. 90 tablet 0  . PROAIR HFA 108 (90 Base) MCG/ACT inhaler INHALE 2 PUFFS INTO THE LUNGS EVERY 6 HOURS AS NEEDED FOR WHEEZING 8.5 g 2  . prochlorperazine (COMPAZINE) 10 MG tablet TAKE 1 TABLET(10 MG) BY MOUTH EVERY 6 HOURS AS NEEDED FOR NAUSEA OR VOMITING 385 tablet 1  . XTAMPZA ER 13.5 MG C12A TK 1 C PO Q 12 H     No facility-administered medications prior to visit.     Allergies  Allergen Reactions  . Doxycycline Other (See Comments)    Flushing of hands and face.     ROS Review of Systems    Objective:    Physical Exam  Constitutional: He is oriented to person, place, and time. He appears well-developed and well-nourished.  HENT:  Head: Normocephalic and atraumatic.  Cardiovascular: Normal rate, regular rhythm and normal heart sounds.  No carotid bruits.   Pulmonary/Chest: Effort normal  and breath sounds normal.  Neurological: He is alert and oriented to person, place, and time.  Skin: Skin is  warm and dry.  Psychiatric: He has a normal mood and affect. His behavior is normal.    BP (!) 146/66   Pulse 62   Ht 5\' 10"  (1.778 m)   Wt 158 lb (71.7 kg)   SpO2 98%   BMI 22.67 kg/m  Wt Readings from Last 3 Encounters:  02/20/19 158 lb (71.7 kg)  02/16/19 157 lb (71.2 kg)  01/26/19 153 lb (69.4 kg)     Health Maintenance Due  Topic Date Due  . OPHTHALMOLOGY EXAM  01/15/2019    There are no preventive care reminders to display for this patient.  Lab Results  Component Value Date   TSH 21.820 (H) 01/26/2019   Lab Results  Component Value Date   WBC 6.4 02/16/2019   HGB 13.9 02/16/2019   HCT 42.6 02/16/2019   MCV 90.3 02/16/2019   PLT 172 02/16/2019   Lab Results  Component Value Date   NA 140 02/16/2019   K 4.6 02/16/2019   CO2 31 02/16/2019   GLUCOSE 129 (H) 02/16/2019   BUN 23 02/16/2019   CREATININE 1.16 02/16/2019   BILITOT 0.6 02/16/2019   ALKPHOS 99 02/16/2019   AST 18 02/16/2019   ALT 14 02/16/2019   PROT 6.8 02/16/2019   ALBUMIN 3.8 02/16/2019   CALCIUM 9.4 02/16/2019   ANIONGAP 9 02/16/2019   Lab Results  Component Value Date   CHOL 89 04/14/2017   Lab Results  Component Value Date   HDL 28 (L) 04/14/2017   Lab Results  Component Value Date   LDLCALC 69 09/09/2015   Lab Results  Component Value Date   TRIG 94 04/14/2017   Lab Results  Component Value Date   CHOLHDL 3.2 04/14/2017   Lab Results  Component Value Date   HGBA1C 5.8 (A) 02/20/2019      Assessment & Plan:   Problem List Items Addressed This Visit      Cardiovascular and Mediastinum   Essential hypertension - Primary    Blood pressure looks much better now that he is on the hydralazine and after sitting for 5 minutes blood pressure actually came all the way down to 146/66.  Explained that optimally we would still like to have it under 140 if possible.   He is only been on the medication for about a week at this point would like for him to continue for at least another week so we can gauge for efficacy of the medication.  If at that point he is still not optimally controlled and we can adjust his regimen can.  Continue to work on monitoring salt intake.  We also discussed possibly adding a diuretic but he wants to hold off on that.  He feels like he urinates too much as it is.        Endocrine   DM type 2 (diabetes mellitus, type 2) ([place])    Once he looks absolutely fantastic today.  Still encouraged him to stick with a healthy diet I am proud of him for his weight loss and he is actually says he feels much better since losing some of the weight.      Relevant Orders   POCT glycosylated hemoglobin (Hb A1C) (Completed)      No orders of the defined types were placed in this encounter.   Follow-up: Return in about 6 months (around 08/22/2019) for bp.    Beatrice Lecher, MD

## 2019-02-20 NOTE — Assessment & Plan Note (Signed)
Blood pressure looks much better now that he is on the hydralazine and after sitting for 5 minutes blood pressure actually came all the way down to 146/66.  Explained that optimally we would still like to have it under 140 if possible.  He is only been on the medication for about a week at this point would like for him to continue for at least another week so we can gauge for efficacy of the medication.  If at that point he is still not optimally controlled and we can adjust his regimen can.  Continue to work on monitoring salt intake.  We also discussed possibly adding a diuretic but he wants to hold off on that.  He feels like he urinates too much as it is.

## 2019-02-28 DIAGNOSIS — M546 Pain in thoracic spine: Secondary | ICD-10-CM | POA: Diagnosis not present

## 2019-02-28 DIAGNOSIS — E119 Type 2 diabetes mellitus without complications: Secondary | ICD-10-CM | POA: Diagnosis not present

## 2019-02-28 DIAGNOSIS — G8929 Other chronic pain: Secondary | ICD-10-CM | POA: Diagnosis not present

## 2019-02-28 DIAGNOSIS — M25511 Pain in right shoulder: Secondary | ICD-10-CM | POA: Diagnosis not present

## 2019-03-02 ENCOUNTER — Other Ambulatory Visit: Payer: Self-pay | Admitting: Hematology & Oncology

## 2019-03-06 ENCOUNTER — Encounter: Payer: Medicare Other | Admitting: Family Medicine

## 2019-03-06 ENCOUNTER — Encounter: Payer: Self-pay | Admitting: Family Medicine

## 2019-03-06 ENCOUNTER — Ambulatory Visit (INDEPENDENT_AMBULATORY_CARE_PROVIDER_SITE_OTHER): Payer: Medicare Other | Admitting: Family Medicine

## 2019-03-06 VITALS — BP 146/66 | HR 58 | Ht 70.0 in | Wt 158.0 lb

## 2019-03-06 DIAGNOSIS — L600 Ingrowing nail: Secondary | ICD-10-CM

## 2019-03-06 DIAGNOSIS — I10 Essential (primary) hypertension: Secondary | ICD-10-CM | POA: Diagnosis not present

## 2019-03-06 MED FILL — INLYTA 5 MG TABLET: 5 | 28 days supply | Qty: 42 | Fill #0

## 2019-03-06 NOTE — Progress Notes (Signed)
Acute Office Visit  Subjective:    Patient ID: [name] [name], male    DOB: [birth year], 78 y.o.   MRN: [id number]  Chief Complaint  Patient presents with  . toenail pain    L great toe    HPI Patient is in today for Left great toenail pain.  He says normally he gets his nails trimmed at a salon and the lady that used to do his feet could cut out his ingrown nail for him.  But she no longer works there. Had someone else work on his toenails and has been bothering him since before he went.  No fever or drianage.  Pain is 5/10. Has pain meds for his back but hasn't used them for his toe.    Past Medical History:  Diagnosis Date  . Allergic rhinitis   . Bigeminy   . Bradycardia   . Bruit   . CAD (coronary artery disease)   . Claudication ([place])   . Diabetes mellitus type II   . Dyspnea   . Goals of care, counseling/discussion 06/02/2018  . History of prostate cancer    Dr. Gaynelle Arabian  . Hypercholesteremia    II A  . Hypertension   . Kidney cancer, primary, with metastasis from kidney to other site, right ([place]) 06/02/2018  . Malignant neoplasm of prostate ([place])   . Microalbuminuria   . Seborrheic keratosis     Past Surgical History:  Procedure Laterality Date  . CORONARY ANGIOPLASTY WITH STENT PLACEMENT    . IR FLUORO RM 30-60 MIN  05/24/2018    Family History  Problem Relation Age of Onset  . Heart attack Father        Acute MI  . Coronary artery disease Brother     Social History   Socioeconomic History  . Marital status: Widowed    Spouse name: Not on file  . Number of children: Not on file  . Years of education: Not on file  . Highest education level: Not on file  Occupational History  . Occupation: Retired but still works Dentist: RETIRED  Social Needs  . Financial resource strain: Not on file  . Food insecurity    Worry: Not on file    Inability: Not on file  . Transportation needs    Medical: Not on file    Non-medical: Not on file   Tobacco Use  . Smoking status: Former Smoker    Packs/day: 1.00    Years: 20.00    Pack years: 20.00    Quit date: 04/22/2003    Years since quitting: 15.8  . Smokeless tobacco: Never Used  Substance and Sexual Activity  . Alcohol use: Not Currently    Alcohol/week: 1.0 standard drinks    Types: 1 Standard drinks or equivalent per week  . Drug use: Never  . Sexual activity: Not Currently  Lifestyle  . Physical activity    Days per week: Not on file    Minutes per session: Not on file  . Stress: Not on file  Relationships  . Social Herbalist on phone: Not on file    Gets together: Not on file    Attends religious service: Not on file    Active member of club or organization: Not on file    Attends meetings of clubs or organizations: Not on file    Relationship status: Not on file  . Intimate partner violence    Fear of  current or ex partner: Not on file    Emotionally abused: Not on file    Physically abused: Not on file    Forced sexual activity: Not on file  Other Topics Concern  . Not on file  Social History Narrative   Widowed 08/2009   Has 4 kids, 2 are local   Fair diet   No regular exercise    Outpatient Medications Prior to Visit  Medication Sig Dispense Refill  . amLODipine (NORVASC) 10 MG tablet TAKE 1 TABLET BY MOUTH DAILY 90 tablet 1  . ANORO ELLIPTA 62.5-25 MCG/INH AEPB INHALE 1 PUFF BY MOUTH EVERY DAY 60 each 3  . Blood Glucose Monitoring Suppl (CONTOUR BLOOD GLUCOSE SYSTEM) DEVI Diagnoses - Diabetes E 11.8 Check blood sugar once daily 1 Device 0  . clopidogrel (PLAVIX) 75 MG tablet TAKE 1 TABLET BY MOUTH EVERY DAY 90 tablet 3  . doxazosin (CARDURA) 4 MG tablet Take 4 mg by mouth.    . gabapentin (NEURONTIN) 300 MG capsule One tab PO qHS for a week, then BID for a week, then TID. May double weekly to a max of 3,600mg /day. MUST SCHEDULE APPT 90 capsule 0  . glucose blood (BAYER CONTOUR TEST) test strip Diagnoses - Diabetes E 11.8 Check blood  sugar once daily 100 each prn  . hydrALAZINE (APRESOLINE) 25 MG tablet Take 1 tablet (25 mg total) by mouth 3 (three) times daily. 90 tablet 4  . INLYTA 5 MG tablet TAKE 1 TABLET (5MG ) BY MOUTH TWICE DAILY FOR 21 DAYS ON, THEN 7 DAYS OFF. 42 tablet 3  . levothyroxine (SYNTHROID) 50 MCG tablet TAKE 1 TABLET(50 MCG) BY MOUTH DAILY BEFORE BREAKFAST 30 tablet 3  . levothyroxine (SYNTHROID) 75 MCG tablet Take 1 tablet (75 mcg total) by mouth daily before breakfast. 30 tablet 4  . losartan (COZAAR) 100 MG tablet TAKE 1 TABLET BY MOUTH EVERY DAY 90 tablet 3  . metFORMIN (GLUCOPHAGE-XR) 500 MG 24 hr tablet TAKE 2 TABLETS BY MOUTH EVERY DAY WITH BREAKFAST 180 tablet 1  . metoprolol succinate (TOPROL-XL) 100 MG 24 hr tablet TAKE 1 TABLET BY MOUTH EVERY DAY 90 tablet 1  . Multiple Vitamins-Minerals (MULTIVITAMIN WITH MINERALS) tablet Take 1 tablet by mouth daily.    . Oxycodone HCl 10 MG TABS Take 1 tablet (10 mg total) by mouth every 6 (six) hours as needed. 90 tablet 0  . PROAIR HFA 108 (90 Base) MCG/ACT inhaler INHALE 2 PUFFS INTO THE LUNGS EVERY 6 HOURS AS NEEDED FOR WHEEZING 8.5 g 2  . prochlorperazine (COMPAZINE) 10 MG tablet TAKE 1 TABLET(10 MG) BY MOUTH EVERY 6 HOURS AS NEEDED FOR NAUSEA OR VOMITING 385 tablet 1  . XTAMPZA ER 13.5 MG C12A TK 1 C PO Q 12 H     No facility-administered medications prior to visit.     Allergies  Allergen Reactions  . Doxycycline Other (See Comments)    Flushing of hands and face.     ROS     Objective:    Physical Exam  Constitutional: He is oriented to person, place, and time. He appears well-developed and well-nourished.  HENT:  Head: Normocephalic and atraumatic.  Eyes: Conjunctivae and EOM are normal.  Cardiovascular: Normal rate.  Pulmonary/Chest: Effort normal.  Musculoskeletal:     Comments: Medial nail border is mildly erythematous with some mild swelling. No sign of drainage or cellulitis.   Neurological: He is alert and oriented to person,  place, and time.  Skin: Skin is dry.  No pallor.  Psychiatric: He has a normal mood and affect. His behavior is normal.  Vitals reviewed.   BP (!) 146/66   Pulse (!) 58   Ht 5\' 10"  (1.778 m)   Wt 158 lb (71.7 kg)   SpO2 99%   BMI 22.67 kg/m  Wt Readings from Last 3 Encounters:  03/06/19 158 lb (71.7 kg)  02/20/19 158 lb (71.7 kg)  02/16/19 157 lb (71.2 kg)    Health Maintenance Due  Topic Date Due  . OPHTHALMOLOGY EXAM  01/15/2019  . FOOT EXAM  02/22/2019    There are no preventive care reminders to display for this patient.   Lab Results  Component Value Date   TSH 21.820 (H) 01/26/2019   Lab Results  Component Value Date   WBC 6.4 02/16/2019   HGB 13.9 02/16/2019   HCT 42.6 02/16/2019   MCV 90.3 02/16/2019   PLT 172 02/16/2019   Lab Results  Component Value Date   NA 140 02/16/2019   K 4.6 02/16/2019   CO2 31 02/16/2019   GLUCOSE 129 (H) 02/16/2019   BUN 23 02/16/2019   CREATININE 1.16 02/16/2019   BILITOT 0.6 02/16/2019   ALKPHOS 99 02/16/2019   AST 18 02/16/2019   ALT 14 02/16/2019   PROT 6.8 02/16/2019   ALBUMIN 3.8 02/16/2019   CALCIUM 9.4 02/16/2019   ANIONGAP 9 02/16/2019   Lab Results  Component Value Date   CHOL 89 04/14/2017   Lab Results  Component Value Date   HDL 28 (L) 04/14/2017   Lab Results  Component Value Date   LDLCALC 69 09/09/2015   Lab Results  Component Value Date   TRIG 94 04/14/2017   Lab Results  Component Value Date   CHOLHDL 3.2 04/14/2017   Lab Results  Component Value Date   HGBA1C 5.8 (A) 02/20/2019       Assessment & Plan:   Problem List Items Addressed This Visit    None    Visit Diagnoses    Ingrown nail of great toe of left foot    -  Primary     Discussed tx options including putting cotton under edge of nail border to lift the nail vs partial removal.  Since he is leaving town in the morning for travel/work we decided to try the cotton under the nail border to lift it up as it grows out.   Look for any sign of infection. Call if not improving.    HTN - BP still high but better than it was.  He will bring meds in Friday so we can make sure he is taking them correctly and at the doses we think he is taking.  No orders of the defined types were placed in this encounter.  Procedure: The toe was cleaned with alcohol prep pads. The skin was numbed with ethyll chloride spray and 2.5 cc of 1% lidocaine was used to anesthetize the lateral nail border.  The spatula was used to life the nail border and sterile cotton was tucked underneath the entire medial edge of the nail. Pt tolerated well.    Beatrice Lecher, MD

## 2019-03-07 NOTE — Progress Notes (Signed)
This encounter was created in error - please disregard.

## 2019-03-10 ENCOUNTER — Inpatient Hospital Stay: Payer: Medicare Other

## 2019-03-10 ENCOUNTER — Ambulatory Visit (INDEPENDENT_AMBULATORY_CARE_PROVIDER_SITE_OTHER): Payer: Medicare Other | Admitting: Sports Medicine

## 2019-03-10 ENCOUNTER — Inpatient Hospital Stay: Payer: Medicare Other | Attending: Hematology & Oncology | Admitting: Hematology & Oncology

## 2019-03-10 ENCOUNTER — Other Ambulatory Visit: Payer: Self-pay

## 2019-03-10 ENCOUNTER — Encounter: Payer: Self-pay | Admitting: Hematology & Oncology

## 2019-03-10 VITALS — BP 166/58 | HR 50 | Temp 98.4°F | Resp 16 | Wt 162.0 lb

## 2019-03-10 VITALS — BP 176/58 | HR 63 | Wt 164.0 lb

## 2019-03-10 DIAGNOSIS — Z79899 Other long term (current) drug therapy: Secondary | ICD-10-CM | POA: Insufficient documentation

## 2019-03-10 DIAGNOSIS — I10 Essential (primary) hypertension: Secondary | ICD-10-CM

## 2019-03-10 DIAGNOSIS — Z7984 Long term (current) use of oral hypoglycemic drugs: Secondary | ICD-10-CM | POA: Insufficient documentation

## 2019-03-10 DIAGNOSIS — C641 Malignant neoplasm of right kidney, except renal pelvis: Secondary | ICD-10-CM

## 2019-03-10 DIAGNOSIS — C7951 Secondary malignant neoplasm of bone: Secondary | ICD-10-CM | POA: Insufficient documentation

## 2019-03-10 DIAGNOSIS — E119 Type 2 diabetes mellitus without complications: Secondary | ICD-10-CM | POA: Insufficient documentation

## 2019-03-10 DIAGNOSIS — Z5112 Encounter for antineoplastic immunotherapy: Secondary | ICD-10-CM | POA: Diagnosis not present

## 2019-03-10 DIAGNOSIS — Z8546 Personal history of malignant neoplasm of prostate: Secondary | ICD-10-CM | POA: Diagnosis not present

## 2019-03-10 DIAGNOSIS — Z86006 Personal history of melanoma in-situ: Secondary | ICD-10-CM | POA: Insufficient documentation

## 2019-03-10 LAB — CMP (CANCER CENTER ONLY)
ALT: 17 U/L (ref 0–44)
AST: 15 U/L (ref 15–41)
Albumin: 3.4 g/dL — ABNORMAL LOW (ref 3.5–5.0)
Alkaline Phosphatase: 77 U/L (ref 38–126)
Anion gap: 7 (ref 5–15)
BUN: 24 mg/dL — ABNORMAL HIGH (ref 8–23)
CO2: 28 mmol/L (ref 22–32)
Calcium: 8.5 mg/dL — ABNORMAL LOW (ref 8.9–10.3)
Chloride: 106 mmol/L (ref 98–111)
Creatinine: 0.98 mg/dL (ref 0.61–1.24)
GFR, Est AFR Am: >60 mL/min (ref >60)
GFR, Estimated: >60 mL/min (ref >60)
Glucose, Bld: 126 mg/dL — ABNORMAL HIGH (ref 70–99)
Potassium: 3.7 mmol/L (ref 3.5–5.1)
Sodium: 141 mmol/L (ref 135–145)
Total Bilirubin: 0.6 mg/dL (ref 0.3–1.2)
Total Protein: 5.6 g/dL — ABNORMAL LOW (ref 6.5–8.1)

## 2019-03-10 LAB — CBC WITH DIFFERENTIAL (CANCER CENTER ONLY)
Abs Immature Granulocytes: 0.02 10*3/uL (ref 0.00–0.07)
Basophils Absolute: 0 10*3/uL (ref 0.0–0.1)
Basophils Relative: 1 %
Eosinophils Absolute: 0.1 10*3/uL (ref 0.0–0.5)
Eosinophils Relative: 2 %
HCT: 40.6 % (ref 39.0–52.0)
Hemoglobin: 12.8 g/dL — ABNORMAL LOW (ref 13.0–17.0)
Immature Granulocytes: 0 %
Lymphocytes Relative: 24 %
Lymphs Abs: 1.4 10*3/uL (ref 0.7–4.0)
MCH: 29.4 pg (ref 26.0–34.0)
MCHC: 31.5 g/dL (ref 30.0–36.0)
MCV: 93.3 fL (ref 80.0–100.0)
Monocytes Absolute: 0.4 10*3/uL (ref 0.1–1.0)
Monocytes Relative: 6 %
Neutro Abs: 3.9 10*3/uL (ref 1.7–7.7)
Neutrophils Relative %: 67 %
Platelet Count: 150 10*3/uL (ref 150–400)
RBC: 4.35 MIL/uL (ref 4.22–5.81)
RDW: 15.7 % — ABNORMAL HIGH (ref 11.5–15.5)
WBC Count: 5.8 10*3/uL (ref 4.0–10.5)
nRBC: 0 % (ref 0.0–0.2)

## 2019-03-10 MED ORDER — SODIUM CHLORIDE 0.9 % IV SOLN
200.0000 mg | Freq: Once | INTRAVENOUS | Status: AC
  Administered 2019-03-10: 200 mg via INTRAVENOUS
  Filled 2019-03-10: qty 8

## 2019-03-10 MED ORDER — SODIUM CHLORIDE 0.9 % IV SOLN
Freq: Once | INTRAVENOUS | Status: AC
  Administered 2019-03-10: 11:00:00 via INTRAVENOUS
  Filled 2019-03-10: qty 250

## 2019-03-10 MED ORDER — LOSARTAN POTASSIUM-HCTZ 50-12.5 MG PO TABS: 1.0000 | ORAL_TABLET | Freq: Two times a day (BID) | ORAL | 3 refills | Status: DC

## 2019-03-10 NOTE — Patient Instructions (Addendum)
Stop taking the Losartan 100 mg and switch to Losartan-HCTZ 50/12.5 mg twice daily.

## 2019-03-10 NOTE — Progress Notes (Signed)
Established Patient Office Visit  Subjective:  Patient ID: [name] [name], male    DOB: 10-11-1940  Age: 78 y.o. MRN: [id number]  CC:  Chief Complaint  Patient presents with  . Hypertension    HPI [name] [name] presents for blood pressure check. Denies chest pain, shortness of breath, headaches or dizziness. He did bring in all his medications for review. He reports he is taking the medication as directed. He did have a bottle of Cardura 4 mg quantity of 90 that he filled on 07/18/2018. He states he stopped the Cardura for a while but has been back on it for the last month.   Past Medical History:  Diagnosis Date  . Allergic rhinitis   . Bigeminy   . Bradycardia   . Bruit   . CAD (coronary artery disease)   . Claudication ([place])   . Diabetes mellitus type II   . Dyspnea   . Goals of care, counseling/discussion 06/02/2018  . History of prostate cancer    Dr. Gaynelle Arabian  . Hypercholesteremia    II A  . Hypertension   . Kidney cancer, primary, with metastasis from kidney to other site, right ([place]) 06/02/2018  . Malignant neoplasm of prostate ([place])   . Microalbuminuria   . Seborrheic keratosis     Past Surgical History:  Procedure Laterality Date  . CORONARY ANGIOPLASTY WITH STENT PLACEMENT    . IR FLUORO RM 30-60 MIN  05/24/2018    Family History  Problem Relation Age of Onset  . Heart attack Father        Acute MI  . Coronary artery disease Brother     Social History   Socioeconomic History  . Marital status: Widowed    Spouse name: Not on file  . Number of children: Not on file  . Years of education: Not on file  . Highest education level: Not on file  Occupational History  . Occupation: Retired but still works Dentist: RETIRED  Social Needs  . Financial resource strain: Not on file  . Food insecurity    Worry: Not on file    Inability: Not on file  . Transportation needs    Medical: Not on file    Non-medical: Not on file  Tobacco Use  .  Smoking status: Former Smoker    Packs/day: 1.00    Years: 20.00    Pack years: 20.00    Quit date: 04/22/2003    Years since quitting: 15.8  . Smokeless tobacco: Never Used  Substance and Sexual Activity  . Alcohol use: Not Currently    Alcohol/week: 1.0 standard drinks    Types: 1 Standard drinks or equivalent per week  . Drug use: Never  . Sexual activity: Not Currently  Lifestyle  . Physical activity    Days per week: Not on file    Minutes per session: Not on file  . Stress: Not on file  Relationships  . Social Herbalist on phone: Not on file    Gets together: Not on file    Attends religious service: Not on file    Active member of club or organization: Not on file    Attends meetings of clubs or organizations: Not on file    Relationship status: Not on file  . Intimate partner violence    Fear of current or ex partner: Not on file    Emotionally abused: Not on file    Physically  abused: Not on file    Forced sexual activity: Not on file  Other Topics Concern  . Not on file  Social History Narrative   Widowed 08/2009   Has 4 kids, 2 are local   Fair diet   No regular exercise    Outpatient Medications Prior to Visit  Medication Sig Dispense Refill  . amLODipine (NORVASC) 10 MG tablet TAKE 1 TABLET BY MOUTH DAILY 90 tablet 1  . ANORO ELLIPTA 62.5-25 MCG/INH AEPB INHALE 1 PUFF BY MOUTH EVERY DAY 60 each 3  . Blood Glucose Monitoring Suppl (CONTOUR BLOOD GLUCOSE SYSTEM) DEVI Diagnoses - Diabetes E 11.8 Check blood sugar once daily 1 Device 0  . clopidogrel (PLAVIX) 75 MG tablet TAKE 1 TABLET BY MOUTH EVERY DAY 90 tablet 3  . doxazosin (CARDURA) 4 MG tablet Take 4 mg by mouth.    . gabapentin (NEURONTIN) 300 MG capsule One tab PO qHS for a week, then BID for a week, then TID. May double weekly to a max of 3,600mg /day. MUST SCHEDULE APPT 90 capsule 0  . glucose blood (BAYER CONTOUR TEST) test strip Diagnoses - Diabetes E 11.8 Check blood sugar once daily 100  each prn  . hydrALAZINE (APRESOLINE) 25 MG tablet Take 1 tablet (25 mg total) by mouth 3 (three) times daily. 90 tablet 4  . INLYTA 5 MG tablet TAKE 1 TABLET (5MG ) BY MOUTH TWICE DAILY FOR 21 DAYS ON, THEN 7 DAYS OFF. 42 tablet 3  . levothyroxine (SYNTHROID) 75 MCG tablet Take 1 tablet (75 mcg total) by mouth daily before breakfast. 30 tablet 4  . losartan (COZAAR) 100 MG tablet TAKE 1 TABLET BY MOUTH EVERY DAY 90 tablet 3  . metFORMIN (GLUCOPHAGE-XR) 500 MG 24 hr tablet TAKE 2 TABLETS BY MOUTH EVERY DAY WITH BREAKFAST 180 tablet 1  . metoprolol succinate (TOPROL-XL) 100 MG 24 hr tablet TAKE 1 TABLET BY MOUTH EVERY DAY 90 tablet 1  . Multiple Vitamins-Minerals (MULTIVITAMIN WITH MINERALS) tablet Take 1 tablet by mouth daily.    . Oxycodone HCl 10 MG TABS Take 1 tablet (10 mg total) by mouth every 6 (six) hours as needed. 90 tablet 0  . PROAIR HFA 108 (90 Base) MCG/ACT inhaler INHALE 2 PUFFS INTO THE LUNGS EVERY 6 HOURS AS NEEDED FOR WHEEZING 8.5 g 2  . XTAMPZA ER 13.5 MG C12A TK 1 C PO Q 12 H    . prochlorperazine (COMPAZINE) 10 MG tablet TAKE 1 TABLET(10 MG) BY MOUTH EVERY 6 HOURS AS NEEDED FOR NAUSEA OR VOMITING 385 tablet 1   No facility-administered medications prior to visit.     Allergies  Allergen Reactions  . Doxycycline Other (See Comments)    Flushing of hands and face.     ROS Review of Systems    Objective:    Physical Exam  BP (!) 176/58   Pulse 63   Wt 164 lb (74.4 kg)   SpO2 98%   BMI 23.53 kg/m  Wt Readings from Last 3 Encounters:  03/10/19 164 lb (74.4 kg)  03/10/19 162 lb (73.5 kg)  03/06/19 158 lb (71.7 kg)     Health Maintenance Due  Topic Date Due  . OPHTHALMOLOGY EXAM  01/15/2019  . FOOT EXAM  02/22/2019    There are no preventive care reminders to display for this patient.  Lab Results  Component Value Date   TSH 21.820 (H) 01/26/2019   Lab Results  Component Value Date   WBC 5.8 03/10/2019   HGB  12.8 (L) 03/10/2019   HCT 40.6  03/10/2019   MCV 93.3 03/10/2019   PLT 150 03/10/2019   Lab Results  Component Value Date   NA 141 03/10/2019   K 3.7 03/10/2019   CO2 28 03/10/2019   GLUCOSE 126 (H) 03/10/2019   BUN 24 (H) 03/10/2019   CREATININE 0.98 03/10/2019   BILITOT 0.6 03/10/2019   ALKPHOS 77 03/10/2019   AST 15 03/10/2019   ALT 17 03/10/2019   PROT 5.6 (L) 03/10/2019   ALBUMIN 3.4 (L) 03/10/2019   CALCIUM 8.5 (L) 03/10/2019   ANIONGAP 7 03/10/2019   Lab Results  Component Value Date   CHOL 89 04/14/2017   Lab Results  Component Value Date   HDL 28 (L) 04/14/2017   Lab Results  Component Value Date   LDLCALC 69 09/09/2015   Lab Results  Component Value Date   TRIG 94 04/14/2017   Lab Results  Component Value Date   CHOLHDL 3.2 04/14/2017   Lab Results  Component Value Date   HGBA1C 5.8 (A) 02/20/2019      Assessment & Plan:  Uncontrolled hypertension - Stop taking the Losartan 100 mg and switch to Losartan-HCTZ 50/12.5 mg twice dail, per Dr Earma Reading. Patient advised to follow up in 2 weeks for blood pressure check.     Problem List Items Addressed This Visit    Essential hypertension    Uncontrolled blood pressures, continue amlodipine 10 mg daily. Losartan has approximately 12-hour duration of action so we are going to switch him to losartan 50 mg/HCTZ 12.5 mg twice daily. Continue hydralazine for now. Continue metoprolol. If insufficient improvement in a 2-week blood pressure check which is certainly local to further for secondary causes of hypertension.      Relevant Medications   losartan-hydrochlorothiazide (HYZAAR) 50-12.5 MG tablet    Other Visit Diagnoses    Uncontrolled hypertension    -  Primary   Relevant Medications   losartan-hydrochlorothiazide (HYZAAR) 50-12.5 MG tablet      Meds ordered this encounter  Medications  . losartan-hydrochlorothiazide (HYZAAR) 50-12.5 MG tablet    Sig: Take 1 tablet by mouth 2 (two) times a day.    Dispense:  60 tablet     Refill:  3    Follow-up: Return in about 2 weeks (around 03/24/2019) for blood pressure check. Durene Romans, Monico Blitz, [place]

## 2019-03-10 NOTE — Assessment & Plan Note (Signed)
Uncontrolled blood pressures, continue amlodipine 10 mg daily. Losartan has approximately 12-hour duration of action so we are going to switch him to losartan 50 mg/HCTZ 12.5 mg twice daily. Continue hydralazine for now. Continue metoprolol. If insufficient improvement in a 2-week blood pressure check which is certainly local to further for secondary causes of hypertension.

## 2019-03-10 NOTE — Patient Instructions (Signed)
[facility] Discharge Instructions for Patients Receiving Chemotherapy  Today you received the following chemotherapy agents :  Keytruda.  To help prevent nausea and vomiting after your treatment, we encourage you to take your nausea medication as prescribed.   If you develop nausea and vomiting that is not controlled by your nausea medication, call the clinic.   BELOW ARE SYMPTOMS THAT SHOULD BE REPORTED IMMEDIATELY:  *FEVER GREATER THAN 100.5 F  *CHILLS WITH OR WITHOUT FEVER  NAUSEA AND VOMITING THAT IS NOT CONTROLLED WITH YOUR NAUSEA MEDICATION  *UNUSUAL SHORTNESS OF BREATH  *UNUSUAL BRUISING OR BLEEDING  TENDERNESS IN MOUTH AND THROAT WITH OR WITHOUT PRESENCE OF ULCERS  *URINARY PROBLEMS  *BOWEL PROBLEMS  UNUSUAL RASH Items with * indicate a potential emergency and should be followed up as soon as possible.  Feel free to call the clinic should you have any questions or concerns. The clinic phone number is [phone].  Please show the CHEMO ALERT CARD at check-in to the Emergency Department and triage nurse.  

## 2019-03-10 NOTE — Progress Notes (Signed)
Hematology and Oncology Follow Up Visit  [name] [name] [id number] December 05, 1940 78 y.o. 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  He is doing okay.  He is still selling a lot of rope.  This has had this is the urine which pontine bugs are being sold to everybody.  Everybody wants a pontoon boat to use for vacation.  He still is having some pain issues.  He has had injections at [facility].  He is not happy about the co-pays that he needs to shell out at the office where he gets his shots.  He has had no problems with appetite.  He is eating well.  He is having no issues with bowels or bladder.  His blood pressure I think might be a little bit better.  He is on the hydralazine.  O I will will verall, his performance status is ECOG 1.   Medications:  Allergies as of 03/10/2019      Reactions   Doxycycline Other (See Comments)   Flushing of hands and face.       Medication List       Accurate as of March 10, 2019 10:45 AM. If you have any questions, ask your nurse or doctor.        amLODipine 10 MG tablet Commonly known as: NORVASC TAKE 1 TABLET BY MOUTH DAILY   Anoro Ellipta 62.5-25 MCG/INH Aepb Generic drug: umeclidinium-vilanterol INHALE 1 PUFF BY MOUTH EVERY DAY   clopidogrel 75 MG tablet Commonly known as: PLAVIX TAKE 1 TABLET BY MOUTH EVERY DAY   CONTOUR BLOOD GLUCOSE SYSTEM Devi Diagnoses - Diabetes E 11.8 Check blood sugar once daily   doxazosin 4 MG tablet Commonly known as: CARDURA Take 4 mg by mouth.   gabapentin 300 MG capsule Commonly known as: NEURONTIN One tab PO qHS for a week, then BID for a week, then TID. May double weekly to a max of  3,669m/day. MUST SCHEDULE APPT   glucose blood test strip Commonly known as: BEstate manager/land agentDiagnoses - Diabetes E 11.8 Check blood sugar once daily   hydrALAZINE 25 MG tablet Commonly known as: APRESOLINE Take 1 tablet (25 mg total) by mouth 3 (three) times daily.   Inlyta 5 MG tablet Generic drug: axitinib TAKE 1 TABLET (5MG) BY MOUTH TWICE DAILY FOR 21 DAYS ON, THEN 7 DAYS OFF.   levothyroxine 75 MCG tablet Commonly known as: Synthroid Take 1 tablet (75 mcg total) by mouth daily before breakfast. What changed: Another medication with the same name was removed. Continue taking this medication, and follow the directions you see here. Changed by: PVolanda Napoleon MD   losartan 100 MG tablet Commonly known as: COZAAR TAKE 1 TABLET BY MOUTH EVERY DAY   metFORMIN 500 MG 24 hr tablet Commonly known as: GLUCOPHAGE-XR TAKE 2 TABLETS BY MOUTH EVERY DAY WITH BREAKFAST   metoprolol succinate 100 MG 24 hr tablet Commonly known as: TOPROL-XL TAKE 1 TABLET BY MOUTH EVERY DAY   multivitamin with minerals tablet Take 1 tablet by mouth daily.   Oxycodone HCl 10 MG Tabs Take 1 tablet (10 mg total) by mouth every 6 (six) hours as needed.  ProAir HFA 108 (90 Base) MCG/ACT inhaler Generic drug: albuterol INHALE 2 PUFFS INTO THE LUNGS EVERY 6 HOURS AS NEEDED FOR WHEEZING   prochlorperazine 10 MG tablet Commonly known as: COMPAZINE TAKE 1 TABLET(10 MG) BY MOUTH EVERY 6 HOURS AS NEEDED FOR NAUSEA OR VOMITING   Xtampza ER 13.5 MG C12a Generic drug: oxyCODONE ER TK 1 C PO Q 12 H       Allergies:  Allergies  Allergen Reactions   Doxycycline Other (See Comments)    Flushing of hands and face.     Past Medical History, Surgical history, Social history, and Family History were reviewed and updated.  Review of Systems: Review of Systems  Constitutional: Negative.   HENT: Negative.   Eyes: Negative.   Respiratory: Negative.   Cardiovascular: Negative.   Gastrointestinal:  Negative.   Genitourinary: Negative.   Musculoskeletal: Negative.   Skin: Negative.   Neurological: Negative.   Endo/Heme/Allergies: Negative.   Psychiatric/Behavioral: Negative.      Physical Exam:  weight is 162 lb (73.5 kg). His oral temperature is 98.4 F (36.9 C). His blood pressure is 166/58 (abnormal) and his pulse is 50 (abnormal). His respiration is 16 and oxygen saturation is 100%.   Wt Readings from Last 3 Encounters:  03/10/19 162 lb (73.5 kg)  03/06/19 158 lb (71.7 kg)  02/20/19 158 lb (71.7 kg)    Physical Exam Vitals signs reviewed.  HENT:     Head: Normocephalic and atraumatic.  Eyes:     Pupils: Pupils are equal, round, and reactive to light.  Neck:     Musculoskeletal: Normal range of motion.  Cardiovascular:     Rate and Rhythm: Normal rate and regular rhythm.     Heart sounds: Normal heart sounds.  Pulmonary:     Effort: Pulmonary effort is normal.     Breath sounds: Normal breath sounds.  Abdominal:     General: Bowel sounds are normal.     Palpations: Abdomen is soft.     Comments: Abdominal exam shows the tic wound in the left lower quadrant.  Is erythematous.  Is slightly firm.  It probably measures about 1 cm x 1.5 cm.  There is no hepatomegaly.  There is no splenomegaly.  Musculoskeletal: Normal range of motion.        General: No tenderness or deformity.  Lymphadenopathy:     Cervical: No cervical adenopathy.  Skin:    General: Skin is warm and dry.     Findings: No erythema or rash.  Neurological:     Mental Status: He is alert and oriented to person, place, and time.  Psychiatric:        Behavior: Behavior normal.        Thought Content: Thought content normal.        Judgment: Judgment normal.       Lab Results  Component Value Date   WBC 5.8 03/10/2019   HGB 12.8 (L) 03/10/2019   HCT 40.6 03/10/2019   MCV 93.3 03/10/2019   PLT 150 03/10/2019   Lab Results  Component Value Date   FERRITIN 95 10/01/2017   Lab Results    Component Value Date   RBC 4.35 03/10/2019   Lab Results  Component Value Date   KPAFRELGTCHN 28.2 (H) 05/05/2018   LAMBDASER 24.6 05/05/2018   KAPLAMBRATIO 5.75 05/09/2018   Lab Results  Component Value Date   IGGSERUM 799 05/05/2018   IGA 229 05/05/2018   IGMSERUM 136 05/05/2018   Lab Results  Component Value Date   TOTALPROTELP 6.6 05/05/2018   ALBUMINELP 3.4 05/05/2018   A1GS 0.3 05/05/2018   A2GS 1.1 (H) 05/05/2018   BETS 0.9 05/05/2018   BETA2SER 0.4 04/29/2018   GAMS 0.8 05/05/2018   MSPIKE Not Observed 05/05/2018   SPEI  04/29/2018     Comment:     . Evaluation is consistent with an acute inflammatory  pattern. . . A faint abnormal protein band is detected in the gamma globulins that most likely represents CRP, or circulating immune complexes, rather than a monoclonal immunoglobulin. . . Immunofixation analysis is available if identification of the band(s) is clinically indicated. .      Chemistry      Component Value Date/Time   NA 141 03/10/2019 0950   NA 140 11/25/2018 1055   K 3.7 03/10/2019 0950   CL 106 03/10/2019 0950   CO2 28 03/10/2019 0950   BUN 24 (H) 03/10/2019 0950   BUN 20 11/25/2018 1055   CREATININE 0.98 03/10/2019 0950   CREATININE 1.11 04/29/2018 1047      Component Value Date/Time   CALCIUM 8.5 (L) 03/10/2019 0950   ALKPHOS 77 03/10/2019 0950   AST 15 03/10/2019 0950   ALT 17 03/10/2019 0950   BILITOT 0.6 03/10/2019 0950      Impression and Plan: Mr. Batson is a very pleasant 78 yo caucasian gentleman with metastatic kidney cancer. He also has history of prostate cancer and melanoma in situ.   We will go ahead with his pembrolizumab today.  We will proceed with his treatments.  This will be his 12th cycle of pembrolizumab.  We will get him back in 3 more weeks.  We probably have to get him set up with CT scans the day that we see him so we can see how things look.    [name], MD 6/19/202010:45 AM

## 2019-03-13 LAB — LACTATE DEHYDROGENASE: LDH: 140 U/L (ref 98–192)

## 2019-03-27 LAB — HM DIABETES EYE EXAM

## 2019-03-30 ENCOUNTER — Other Ambulatory Visit: Payer: Self-pay

## 2019-03-30 ENCOUNTER — Inpatient Hospital Stay: Payer: Medicare Other

## 2019-03-30 ENCOUNTER — Ambulatory Visit (HOSPITAL_BASED_OUTPATIENT_CLINIC_OR_DEPARTMENT_OTHER)
Admission: RE | Admit: 2019-03-30 | Discharge: 2019-03-30 | Disposition: A | Discharge status: Home / Self Care | Payer: Medicare Other | Source: Ambulatory Visit | Attending: Hematology & Oncology | Admitting: Hematology & Oncology

## 2019-03-30 ENCOUNTER — Encounter: Payer: Self-pay | Admitting: Family Medicine

## 2019-03-30 ENCOUNTER — Encounter (HOSPITAL_BASED_OUTPATIENT_CLINIC_OR_DEPARTMENT_OTHER): Payer: Self-pay

## 2019-03-30 ENCOUNTER — Inpatient Hospital Stay: Payer: Medicare Other | Attending: Hematology & Oncology | Admitting: Hematology & Oncology

## 2019-03-30 ENCOUNTER — Telehealth: Payer: Self-pay | Admitting: *Deleted

## 2019-03-30 ENCOUNTER — Encounter: Payer: Self-pay | Admitting: Hematology & Oncology

## 2019-03-30 VITALS — BP 178/82 | HR 62 | Temp 98.2°F | Resp 18 | Wt 153.0 lb

## 2019-03-30 DIAGNOSIS — Z79899 Other long term (current) drug therapy: Secondary | ICD-10-CM

## 2019-03-30 DIAGNOSIS — C641 Malignant neoplasm of right kidney, except renal pelvis: Secondary | ICD-10-CM

## 2019-03-30 DIAGNOSIS — C7951 Secondary malignant neoplasm of bone: Secondary | ICD-10-CM | POA: Insufficient documentation

## 2019-03-30 DIAGNOSIS — Z923 Personal history of irradiation: Secondary | ICD-10-CM

## 2019-03-30 DIAGNOSIS — N2889 Other specified disorders of kidney and ureter: Secondary | ICD-10-CM | POA: Diagnosis not present

## 2019-03-30 DIAGNOSIS — Z86007 Personal history of in-situ neoplasm of skin: Secondary | ICD-10-CM

## 2019-03-30 DIAGNOSIS — Z8546 Personal history of malignant neoplasm of prostate: Secondary | ICD-10-CM

## 2019-03-30 DIAGNOSIS — C649 Malignant neoplasm of unspecified kidney, except renal pelvis: Secondary | ICD-10-CM | POA: Diagnosis not present

## 2019-03-30 DIAGNOSIS — Z5111 Encounter for antineoplastic chemotherapy: Secondary | ICD-10-CM | POA: Insufficient documentation

## 2019-03-30 DIAGNOSIS — I2510 Atherosclerotic heart disease of native coronary artery without angina pectoris: Secondary | ICD-10-CM | POA: Diagnosis not present

## 2019-03-30 DIAGNOSIS — I700 Atherosclerosis of aorta: Secondary | ICD-10-CM | POA: Diagnosis not present

## 2019-03-30 DIAGNOSIS — Z5112 Encounter for antineoplastic immunotherapy: Secondary | ICD-10-CM | POA: Diagnosis present

## 2019-03-30 LAB — CMP (CANCER CENTER ONLY)
ALT: 25 U/L (ref 0–44)
AST: 23 U/L (ref 15–41)
Albumin: 3.7 g/dL (ref 3.5–5.0)
Alkaline Phosphatase: 78 U/L (ref 38–126)
Anion gap: 8 (ref 5–15)
BUN: 23 mg/dL (ref 8–23)
CO2: 32 mmol/L (ref 22–32)
Calcium: 9 mg/dL (ref 8.9–10.3)
Chloride: 96 mmol/L — ABNORMAL LOW (ref 98–111)
Creatinine: 0.93 mg/dL (ref 0.61–1.24)
GFR, Est AFR Am: >60 mL/min (ref >60)
GFR, Estimated: >60 mL/min (ref >60)
Glucose, Bld: 119 mg/dL — ABNORMAL HIGH (ref 70–99)
Potassium: 2.9 mmol/L — CL (ref 3.5–5.1)
Sodium: 136 mmol/L (ref 135–145)
Total Bilirubin: 0.7 mg/dL (ref 0.3–1.2)
Total Protein: 6.3 g/dL — ABNORMAL LOW (ref 6.5–8.1)

## 2019-03-30 LAB — CBC WITH DIFFERENTIAL (CANCER CENTER ONLY)
Abs Immature Granulocytes: 0.04 10*3/uL (ref 0.00–0.07)
Basophils Absolute: 0 10*3/uL (ref 0.0–0.1)
Basophils Relative: 1 %
Eosinophils Absolute: 0.1 10*3/uL (ref 0.0–0.5)
Eosinophils Relative: 1 %
HCT: 42.4 % (ref 39.0–52.0)
Hemoglobin: 14.3 g/dL (ref 13.0–17.0)
Immature Granulocytes: 1 %
Lymphocytes Relative: 20 %
Lymphs Abs: 1.4 10*3/uL (ref 0.7–4.0)
MCH: 29.9 pg (ref 26.0–34.0)
MCHC: 33.7 g/dL (ref 30.0–36.0)
MCV: 88.7 fL (ref 80.0–100.0)
Monocytes Absolute: 0.4 10*3/uL (ref 0.1–1.0)
Monocytes Relative: 6 %
Neutro Abs: 5 10*3/uL (ref 1.7–7.7)
Neutrophils Relative %: 71 %
Platelet Count: 236 10*3/uL (ref 150–400)
RBC: 4.78 MIL/uL (ref 4.22–5.81)
RDW: 15 % (ref 11.5–15.5)
WBC Count: 7 10*3/uL (ref 4.0–10.5)
nRBC: 0 % (ref 0.0–0.2)

## 2019-03-30 LAB — LACTATE DEHYDROGENASE: LDH: 187 U/L (ref 98–192)

## 2019-03-30 MED ORDER — SODIUM CHLORIDE 0.9 % IV SOLN
200.0000 mg | Freq: Once | INTRAVENOUS | Status: AC
  Administered 2019-03-30: 200 mg via INTRAVENOUS
  Filled 2019-03-30: qty 8

## 2019-03-30 MED ORDER — DENOSUMAB 120 MG/1.7ML ~~LOC~~ SOLN
120.0000 mg | Freq: Once | SUBCUTANEOUS | Status: AC
  Administered 2019-03-30: 13:00:00 120 mg via SUBCUTANEOUS

## 2019-03-30 MED ORDER — IOHEXOL 300 MG/ML  SOLN
100.0000 mL | Freq: Once | INTRAMUSCULAR | Status: AC | PRN
  Administered 2019-03-30: 100 mL via INTRAVENOUS

## 2019-03-30 MED ORDER — DENOSUMAB 120 MG/1.7ML ~~LOC~~ SOLN
SUBCUTANEOUS | Status: AC
  Filled 2019-03-30: qty 1.7

## 2019-03-30 MED ORDER — POTASSIUM CHLORIDE CRYS ER 20 MEQ PO TBCR
40.0000 meq | EXTENDED_RELEASE_TABLET | Freq: Two times a day (BID) | ORAL | Status: DC
  Administered 2019-03-30: 40 meq via ORAL
  Filled 2019-03-30: qty 2

## 2019-03-30 MED ORDER — SODIUM CHLORIDE 0.9 % IV SOLN
Freq: Once | INTRAVENOUS | Status: AC
  Administered 2019-03-30: 11:00:00 via INTRAVENOUS
  Filled 2019-03-30: qty 250

## 2019-03-30 NOTE — Telephone Encounter (Signed)
Dr. Marin Olp notified of potassium-2.9.  No new orders received at this time.

## 2019-03-30 NOTE — Progress Notes (Signed)
Hematology and Oncology Follow Up Visit  [name] [name] [id number] March 09, 1941 78 y.o. 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  So far, he is doing pretty well.  He had a very nice July 4 weekend.  His family came over.  He is still out on the road selling rope.  He will get back out next week.  We did go ahead and do a CT scan on him.  This was done today.  CT scan showed a continued response with the renal cell carcinoma.  There is no growth at all.  The tumor appeared to be a little bit smaller.  He has stable bone metastasis.  He goes to his pain doctor at [facility]  He has had no problems with bleeding.  He has had no headache.  He has had no change in bowel or bladder habits.  There is been no leg swelling.  Even though his blood pressure is high here, he checks it at home and his systolic is typically about 140.  Overall  his performance status is ECOG 1.   Medications:  Allergies as of 03/30/2019      Reactions   Doxycycline Other (See Comments)   Flushing of hands and face.       Medication List       Accurate as of March 30, 2019  9:58 AM. If you have any questions, ask your nurse or doctor.        amLODipine 10 MG tablet Commonly known as: NORVASC TAKE 1 TABLET BY MOUTH DAILY   Anoro Ellipta 62.5-25 MCG/INH Aepb Generic drug: umeclidinium-vilanterol INHALE 1 PUFF BY MOUTH EVERY DAY   clopidogrel 75 MG tablet Commonly known as: PLAVIX TAKE 1 TABLET BY MOUTH EVERY DAY   CONTOUR BLOOD GLUCOSE SYSTEM Devi Diagnoses - Diabetes E 11.8 Check blood sugar once daily   doxazosin 4 MG tablet Commonly known as: CARDURA Take 4 mg by mouth.   gabapentin  300 MG capsule Commonly known as: NEURONTIN One tab PO qHS for a week, then BID for a week, then TID. May double weekly to a max of 3,655m/day. MUST SCHEDULE APPT   glucose blood test strip Commonly known as: BEstate manager/land agentDiagnoses - Diabetes E 11.8 Check blood sugar once daily   hydrALAZINE 25 MG tablet Commonly known as: APRESOLINE Take 1 tablet (25 mg total) by mouth 3 (three) times daily.   Inlyta 5 MG tablet Generic drug: axitinib TAKE 1 TABLET (5MG) BY MOUTH TWICE DAILY FOR 21 DAYS ON, THEN 7 DAYS OFF.   levothyroxine 75 MCG tablet Commonly known as: Synthroid Take 1 tablet (75 mcg total) by mouth daily before breakfast.   losartan 100 MG tablet Commonly known as: COZAAR TAKE 1 TABLET BY MOUTH EVERY DAY   losartan-hydrochlorothiazide 50-12.5 MG tablet Commonly known as: HYZAAR Take 1 tablet by mouth 2 (two) times a day.   metFORMIN 500 MG 24 hr tablet Commonly known as: GLUCOPHAGE-XR TAKE 2 TABLETS BY MOUTH EVERY DAY WITH BREAKFAST   metoprolol succinate 100 MG 24 hr tablet Commonly known as: TOPROL-XL TAKE 1 TABLET BY MOUTH EVERY DAY   multivitamin with minerals tablet Take  1 tablet by mouth daily.   Oxycodone HCl 10 MG Tabs Take 1 tablet (10 mg total) by mouth every 6 (six) hours as needed.   ProAir HFA 108 (90 Base) MCG/ACT inhaler Generic drug: albuterol INHALE 2 PUFFS INTO THE LUNGS EVERY 6 HOURS AS NEEDED FOR WHEEZING   prochlorperazine 10 MG tablet Commonly known as: COMPAZINE TAKE 1 TABLET(10 MG) BY MOUTH EVERY 6 HOURS AS NEEDED FOR NAUSEA OR VOMITING   Xtampza ER 13.5 MG C12a Generic drug: oxyCODONE ER TK 1 C PO Q 12 H       Allergies:  Allergies  Allergen Reactions  . Doxycycline Other (See Comments)    Flushing of hands and face.     Past Medical History, Surgical history, Social history, and Family History were reviewed and updated.  Review of Systems: Review of Systems  Constitutional: Negative.   HENT: Negative.   Eyes:  Negative.   Respiratory: Negative.   Cardiovascular: Negative.   Gastrointestinal: Negative.   Genitourinary: Negative.   Musculoskeletal: Negative.   Skin: Negative.   Neurological: Negative.   Endo/Heme/Allergies: Negative.   Psychiatric/Behavioral: Negative.      Physical Exam:  weight is 153 lb (69.4 kg). His temperature is 98.2 F (36.8 C). His blood pressure is 178/82 (abnormal) and his pulse is 62. His respiration is 18 and oxygen saturation is 100%.   Wt Readings from Last 3 Encounters:  03/30/19 153 lb (69.4 kg)  03/30/19 153 lb 6.4 oz (69.6 kg)  03/10/19 164 lb (74.4 kg)    Physical Exam Vitals signs reviewed.  HENT:     Head: Normocephalic and atraumatic.  Eyes:     Pupils: Pupils are equal, round, and reactive to light.  Neck:     Musculoskeletal: Normal range of motion.  Cardiovascular:     Rate and Rhythm: Normal rate and regular rhythm.     Heart sounds: Normal heart sounds.  Pulmonary:     Effort: Pulmonary effort is normal.     Breath sounds: Normal breath sounds.  Abdominal:     General: Bowel sounds are normal.     Palpations: Abdomen is soft.     Comments: Abdominal exam shows the tic wound in the left lower quadrant.  Is erythematous.  Is slightly firm.  It probably measures about 1 cm x 1.5 cm.  There is no hepatomegaly.  There is no splenomegaly.  Musculoskeletal: Normal range of motion.        General: No tenderness or deformity.  Lymphadenopathy:     Cervical: No cervical adenopathy.  Skin:    General: Skin is warm and dry.     Findings: No erythema or rash.  Neurological:     Mental Status: He is alert and oriented to person, place, and time.  Psychiatric:        Behavior: Behavior normal.        Thought Content: Thought content normal.        Judgment: Judgment normal.       Lab Results  Component Value Date   WBC 7.0 03/30/2019   HGB 14.3 03/30/2019   HCT 42.4 03/30/2019   MCV 88.7 03/30/2019   PLT 236 03/30/2019   Lab  Results  Component Value Date   FERRITIN 95 10/01/2017   Lab Results  Component Value Date   RBC 4.78 03/30/2019   Lab Results  Component Value Date   KPAFRELGTCHN 28.2 (H) 05/05/2018   LAMBDASER 24.6 05/05/2018   KAPLAMBRATIO 5.75 05/09/2018  Lab Results  Component Value Date   IGGSERUM 799 05/05/2018   IGA 229 05/05/2018   IGMSERUM 136 05/05/2018   Lab Results  Component Value Date   TOTALPROTELP 6.6 05/05/2018   ALBUMINELP 3.4 05/05/2018   A1GS 0.3 05/05/2018   A2GS 1.1 (H) 05/05/2018   BETS 0.9 05/05/2018   BETA2SER 0.4 04/29/2018   GAMS 0.8 05/05/2018   MSPIKE Not Observed 05/05/2018   SPEI  04/29/2018     Comment:     . Evaluation is consistent with an acute inflammatory  pattern. . . A faint abnormal protein band is detected in the gamma globulins that most likely represents CRP, or circulating immune complexes, rather than a monoclonal immunoglobulin. . . Immunofixation analysis is available if identification of the band(s) is clinically indicated. .      Chemistry      Component Value Date/Time   NA 141 03/10/2019 0950   NA 140 11/25/2018 1055   K 3.7 03/10/2019 0950   CL 106 03/10/2019 0950   CO2 28 03/10/2019 0950   BUN 24 (H) 03/10/2019 0950   BUN 20 11/25/2018 1055   CREATININE 0.98 03/10/2019 0950   CREATININE 1.11 04/29/2018 1047      Component Value Date/Time   CALCIUM 8.5 (L) 03/10/2019 0950   ALKPHOS 77 03/10/2019 0950   AST 15 03/10/2019 0950   ALT 17 03/10/2019 0950   BILITOT 0.6 03/10/2019 0950      Impression and Plan: Mr. Swindell is a very pleasant 78 yo caucasian gentleman with metastatic kidney cancer. He also has history of prostate cancer and melanoma in situ.   I am just happy that he is responding still.  He is doing well with the Inlyta take it 3 weeks on and one-week off.  I think this is a good protocol for him.  We will plan for another follow-up in 3 weeks.  His potassium is on the low side.  I will give him  some potassium in the office today.  [name], MD 7/9/20209:58 AM

## 2019-03-30 NOTE — Patient Instructions (Signed)
Pembrolizumab injection What is this medicine? PEMBROLIZUMAB (pem broe liz ue mab) is a monoclonal antibody. It is used to treat bladder cancer, cervical cancer, endometrial cancer, esophageal cancer, head and neck cancer, hepatocellular cancer, Hodgkin lymphoma, kidney cancer, lymphoma, melanoma, Merkel cell carcinoma, lung cancer, stomach cancer, urothelial cancer, and cancers that have a certain genetic condition. This medicine may be used for other purposes; ask your health care provider or pharmacist if you have questions. COMMON BRAND NAME(S): Keytruda What should I tell my health care provider before I take this medicine? They need to know if you have any of these conditions:  diabetes  immune system problems  inflammatory bowel disease  liver disease  lung or breathing disease  lupus  received or scheduled to receive an organ transplant or a stem-cell transplant that uses donor stem cells  an unusual or allergic reaction to pembrolizumab, other medicines, foods, dyes, or preservatives  pregnant or trying to get pregnant  breast-feeding How should I use this medicine? This medicine is for infusion into a vein. It is given by a health care professional in a hospital or clinic setting. A special MedGuide will be given to you before each treatment. Be sure to read this information carefully each time. Talk to your pediatrician regarding the use of this medicine in children. While this drug may be prescribed for selected conditions, precautions do apply. Overdosage: If you think you have taken too much of this medicine contact a poison control center or emergency room at once. NOTE: This medicine is only for you. Do not share this medicine with others. What if I miss a dose? It is important not to miss your dose. Call your doctor or health care professional if you are unable to keep an appointment. What may interact with this medicine? Interactions have not been studied. Give  your health care provider a list of all the medicines, herbs, non-prescription drugs, or dietary supplements you use. Also tell them if you smoke, drink alcohol, or use illegal drugs. Some items may interact with your medicine. This list may not describe all possible interactions. Give your health care provider a list of all the medicines, herbs, non-prescription drugs, or dietary supplements you use. Also tell them if you smoke, drink alcohol, or use illegal drugs. Some items may interact with your medicine. What should I watch for while using this medicine? Your condition will be monitored carefully while you are receiving this medicine. You may need blood work done while you are taking this medicine. Do not become pregnant while taking this medicine or for 4 months after stopping it. Women should inform their doctor if they wish to become pregnant or think they might be pregnant. There is a potential for serious side effects to an unborn child. Talk to your health care professional or pharmacist for more information. Do not breast-feed an infant while taking this medicine or for 4 months after the last dose. What side effects may I notice from receiving this medicine? Side effects that you should report to your doctor or health care professional as soon as possible:  allergic reactions like skin rash, itching or hives, swelling of the face, lips, or tongue  bloody or black, tarry  breathing problems  changes in vision  chest pain  chills  confusion  constipation  cough  diarrhea  dizziness or feeling faint or lightheaded  fast or irregular heartbeat  fever  flushing  hair loss  joint pain  low blood counts - this   medicine may decrease the number of white blood cells, red blood cells and platelets. You may be at increased risk for infections and bleeding.  muscle pain  muscle weakness  persistent headache  redness, blistering, peeling or loosening of the skin,  including inside the mouth  signs and symptoms of high blood sugar such as dizziness; dry mouth; dry skin; fruity breath; nausea; stomach pain; increased hunger or thirst; increased urination  signs and symptoms of kidney injury like trouble passing urine or change in the amount of urine  signs and symptoms of liver injury like dark urine, light-colored stools, loss of appetite, nausea, right upper belly pain, yellowing of the eyes or skin  sweating  swollen lymph nodes  weight loss Side effects that usually do not require medical attention (report to your doctor or health care professional if they continue or are bothersome):  decreased appetite  muscle pain  tiredness This list may not describe all possible side effects. Call your doctor for medical advice about side effects. You may report side effects to FDA at 1-800-FDA-1088. Where should I keep my medicine? This drug is given in a hospital or clinic and will not be stored at home. NOTE: This sheet is a summary. It may not cover all possible information. If you have questions about this medicine, talk to your doctor, pharmacist, or health care provider.  2020 Elsevier/Gold Standard (2018-10-04 13:46:58)  

## 2019-03-31 DIAGNOSIS — M7551 Bursitis of right shoulder: Secondary | ICD-10-CM | POA: Diagnosis not present

## 2019-03-31 DIAGNOSIS — R0789 Other chest pain: Secondary | ICD-10-CM | POA: Diagnosis not present

## 2019-04-03 ENCOUNTER — Other Ambulatory Visit: Payer: Self-pay | Admitting: Family Medicine

## 2019-04-03 ENCOUNTER — Other Ambulatory Visit: Payer: Self-pay

## 2019-04-03 ENCOUNTER — Ambulatory Visit (INDEPENDENT_AMBULATORY_CARE_PROVIDER_SITE_OTHER): Payer: Medicare Other | Admitting: Family Medicine

## 2019-04-03 VITALS — BP 135/60 | HR 74 | Wt 160.0 lb

## 2019-04-03 DIAGNOSIS — I10 Essential (primary) hypertension: Secondary | ICD-10-CM | POA: Diagnosis not present

## 2019-04-03 MED FILL — INLYTA 5 MG TABLET: 5 | 28 days supply | Qty: 42 | Fill #1

## 2019-04-03 NOTE — Progress Notes (Signed)
Agree with documentation as above.   Catherine Metheney, MD  

## 2019-04-03 NOTE — Progress Notes (Signed)
Established Patient Office Visit  Subjective:  Patient ID: [name] [name], male    DOB: May 19, 1941  Age: 78 y.o. MRN: [id number]  CC:  Chief Complaint  Patient presents with  . Hypertension    HPI [name] [name] presents for blood pressure check. Denies chest pain, shortness of breath, dizziness or headaches. He states his home blood pressure this morning was 152/62.  Past Medical History:  Diagnosis Date  . Allergic rhinitis   . Bigeminy   . Bradycardia   . Bruit   . CAD (coronary artery disease)   . Claudication ([place])   . Diabetes mellitus type II   . Dyspnea   . Goals of care, counseling/discussion 06/02/2018  . History of prostate cancer    Dr. Gaynelle Arabian  . Hypercholesteremia    II A  . Hypertension   . Kidney cancer, primary, with metastasis from kidney to other site, right ([place]) 06/02/2018  . Malignant neoplasm of prostate ([place])   . Microalbuminuria   . Seborrheic keratosis     Past Surgical History:  Procedure Laterality Date  . CORONARY ANGIOPLASTY WITH STENT PLACEMENT    . IR FLUORO RM 30-60 MIN  05/24/2018    Family History  Problem Relation Age of Onset  . Heart attack Father        Acute MI  . Coronary artery disease Brother     Social History   Socioeconomic History  . Marital status: Widowed    Spouse name: Not on file  . Number of children: Not on file  . Years of education: Not on file  . Highest education level: Not on file  Occupational History  . Occupation: Retired but still works Dentist: RETIRED  Social Needs  . Financial resource strain: Not on file  . Food insecurity    Worry: Not on file    Inability: Not on file  . Transportation needs    Medical: Not on file    Non-medical: Not on file  Tobacco Use  . Smoking status: Former Smoker    Packs/day: 1.00    Years: 20.00    Pack years: 20.00    Quit date: 04/22/2003    Years since quitting: 15.9  . Smokeless tobacco: Never Used  Substance and Sexual Activity  .  Alcohol use: Not Currently    Alcohol/week: 1.0 standard drinks    Types: 1 Standard drinks or equivalent per week  . Drug use: Never  . Sexual activity: Not Currently  Lifestyle  . Physical activity    Days per week: Not on file    Minutes per session: Not on file  . Stress: Not on file  Relationships  . Social Herbalist on phone: Not on file    Gets together: Not on file    Attends religious service: Not on file    Active member of club or organization: Not on file    Attends meetings of clubs or organizations: Not on file    Relationship status: Not on file  . Intimate partner violence    Fear of current or ex partner: Not on file    Emotionally abused: Not on file    Physically abused: Not on file    Forced sexual activity: Not on file  Other Topics Concern  . Not on file  Social History Narrative   Widowed 08/2009   Has 4 kids, 2 are local   Fair diet   No  regular exercise    Outpatient Medications Prior to Visit  Medication Sig Dispense Refill  . amLODipine (NORVASC) 10 MG tablet TAKE 1 TABLET BY MOUTH DAILY 90 tablet 1  . ANORO ELLIPTA 62.5-25 MCG/INH AEPB INHALE 1 PUFF BY MOUTH EVERY DAY 60 each 3  . Blood Glucose Monitoring Suppl (CONTOUR BLOOD GLUCOSE SYSTEM) DEVI Diagnoses - Diabetes E 11.8 Check blood sugar once daily 1 Device 0  . clopidogrel (PLAVIX) 75 MG tablet TAKE 1 TABLET BY MOUTH EVERY DAY 90 tablet 3  . doxazosin (CARDURA) 4 MG tablet Take 4 mg by mouth.    . gabapentin (NEURONTIN) 300 MG capsule One tab PO qHS for a week, then BID for a week, then TID. May double weekly to a max of 3,600mg /day. MUST SCHEDULE APPT 90 capsule 0  . glucose blood (BAYER CONTOUR TEST) test strip Diagnoses - Diabetes E 11.8 Check blood sugar once daily 100 each prn  . hydrALAZINE (APRESOLINE) 25 MG tablet Take 1 tablet (25 mg total) by mouth 3 (three) times daily. 90 tablet 4  . INLYTA 5 MG tablet TAKE 1 TABLET (5MG ) BY MOUTH TWICE DAILY FOR 21 DAYS ON, THEN 7 DAYS  OFF. 42 tablet 3  . levothyroxine (SYNTHROID) 75 MCG tablet Take 1 tablet (75 mcg total) by mouth daily before breakfast. 30 tablet 4  . losartan (COZAAR) 100 MG tablet TAKE 1 TABLET BY MOUTH EVERY DAY 90 tablet 3  . losartan-hydrochlorothiazide (HYZAAR) 50-12.5 MG tablet Take 1 tablet by mouth 2 (two) times a day. 60 tablet 3  . metFORMIN (GLUCOPHAGE-XR) 500 MG 24 hr tablet TAKE 2 TABLETS BY MOUTH EVERY DAY WITH BREAKFAST 180 tablet 1  . metoprolol succinate (TOPROL-XL) 100 MG 24 hr tablet TAKE 1 TABLET BY MOUTH EVERY DAY 90 tablet 1  . Multiple Vitamins-Minerals (MULTIVITAMIN WITH MINERALS) tablet Take 1 tablet by mouth daily.    . Oxycodone HCl 10 MG TABS Take 1 tablet (10 mg total) by mouth every 6 (six) hours as needed. 90 tablet 0  . PROAIR HFA 108 (90 Base) MCG/ACT inhaler INHALE 2 PUFFS INTO THE LUNGS EVERY 6 HOURS AS NEEDED FOR WHEEZING 8.5 g 2  . prochlorperazine (COMPAZINE) 10 MG tablet TAKE 1 TABLET(10 MG) BY MOUTH EVERY 6 HOURS AS NEEDED FOR NAUSEA OR VOMITING 385 tablet 1  . XTAMPZA ER 13.5 MG C12A TK 1 C PO Q 12 H     No facility-administered medications prior to visit.     Allergies  Allergen Reactions  . Doxycycline Other (See Comments)    Flushing of hands and face.     ROS Review of Systems    Objective:    Physical Exam  BP 135/60   Pulse 74   Wt 160 lb (72.6 kg)   SpO2 97%   BMI 22.96 kg/m  Wt Readings from Last 3 Encounters:  04/03/19 160 lb (72.6 kg)  03/30/19 153 lb (69.4 kg)  03/30/19 153 lb 6.4 oz (69.6 kg)     Health Maintenance Due  Topic Date Due  . FOOT EXAM  02/22/2019    There are no preventive care reminders to display for this patient.  Lab Results  Component Value Date   TSH 21.820 (H) 01/26/2019   Lab Results  Component Value Date   WBC 7.0 03/30/2019   HGB 14.3 03/30/2019   HCT 42.4 03/30/2019   MCV 88.7 03/30/2019   PLT 236 03/30/2019   Lab Results  Component Value Date   NA 136 03/30/2019  K 2.9 (LL) 03/30/2019    CO2 32 03/30/2019   GLUCOSE 119 (H) 03/30/2019   BUN 23 03/30/2019   CREATININE 0.93 03/30/2019   BILITOT 0.7 03/30/2019   ALKPHOS 78 03/30/2019   AST 23 03/30/2019   ALT 25 03/30/2019   PROT 6.3 (L) 03/30/2019   ALBUMIN 3.7 03/30/2019   CALCIUM 9.0 03/30/2019   ANIONGAP 8 03/30/2019   Lab Results  Component Value Date   CHOL 89 04/14/2017   Lab Results  Component Value Date   HDL 28 (L) 04/14/2017   Lab Results  Component Value Date   LDLCALC 69 09/09/2015   Lab Results  Component Value Date   TRIG 94 04/14/2017   Lab Results  Component Value Date   CHOLHDL 3.2 04/14/2017   Lab Results  Component Value Date   HGBA1C 5.8 (A) 02/20/2019      Assessment & Plan:  Hypertension - Second check of blood pressure was within normal limits. Patient advised to continue current medications. Follow up with Dr Madilyn Fireman in a couple weeks.     Problem List Items Addressed This Visit    Essential hypertension - Primary      No orders of the defined types were placed in this encounter.   Follow-up: No follow-ups on file.    Lavell Luster, [place]

## 2019-04-06 ENCOUNTER — Other Ambulatory Visit: Payer: Self-pay | Admitting: *Deleted

## 2019-04-06 MED ORDER — XTAMPZA ER 13.5 MG PO C12A: 1.0000 | EXTENDED_RELEASE_CAPSULE | Freq: Two times a day (BID) | ORAL | 0 refills | Status: DC

## 2019-04-12 ENCOUNTER — Other Ambulatory Visit: Payer: Self-pay | Admitting: Family Medicine

## 2019-04-24 ENCOUNTER — Telehealth: Payer: Self-pay

## 2019-04-24 ENCOUNTER — Encounter: Payer: Self-pay | Admitting: Hematology & Oncology

## 2019-04-24 ENCOUNTER — Inpatient Hospital Stay (HOSPITAL_BASED_OUTPATIENT_CLINIC_OR_DEPARTMENT_OTHER): Payer: Medicare Other | Admitting: Hematology & Oncology

## 2019-04-24 ENCOUNTER — Other Ambulatory Visit: Payer: Self-pay

## 2019-04-24 ENCOUNTER — Inpatient Hospital Stay: Payer: Medicare Other | Attending: Hematology & Oncology

## 2019-04-24 ENCOUNTER — Inpatient Hospital Stay: Payer: Medicare Other

## 2019-04-24 VITALS — BP 202/63 | HR 54 | Temp 97.5°F | Resp 16 | Wt 157.0 lb

## 2019-04-24 DIAGNOSIS — C7951 Secondary malignant neoplasm of bone: Secondary | ICD-10-CM | POA: Diagnosis not present

## 2019-04-24 DIAGNOSIS — C641 Malignant neoplasm of right kidney, except renal pelvis: Secondary | ICD-10-CM

## 2019-04-24 DIAGNOSIS — Z5112 Encounter for antineoplastic immunotherapy: Secondary | ICD-10-CM | POA: Diagnosis not present

## 2019-04-24 LAB — CBC WITH DIFFERENTIAL (CANCER CENTER ONLY)
Abs Immature Granulocytes: 0.02 10*3/uL (ref 0.00–0.07)
Basophils Absolute: 0.1 10*3/uL (ref 0.0–0.1)
Basophils Relative: 1 %
Eosinophils Absolute: 0.4 10*3/uL (ref 0.0–0.5)
Eosinophils Relative: 7 %
HCT: 44 % (ref 39.0–52.0)
Hemoglobin: 14.2 g/dL (ref 13.0–17.0)
Immature Granulocytes: 0 %
Lymphocytes Relative: 24 %
Lymphs Abs: 1.5 10*3/uL (ref 0.7–4.0)
MCH: 29.8 pg (ref 26.0–34.0)
MCHC: 32.3 g/dL (ref 30.0–36.0)
MCV: 92.4 fL (ref 80.0–100.0)
Monocytes Absolute: 0.4 10*3/uL (ref 0.1–1.0)
Monocytes Relative: 7 %
Neutro Abs: 3.6 10*3/uL (ref 1.7–7.7)
Neutrophils Relative %: 61 %
Platelet Count: 149 10*3/uL — ABNORMAL LOW (ref 150–400)
RBC: 4.76 MIL/uL (ref 4.22–5.81)
RDW: 14.7 % (ref 11.5–15.5)
WBC Count: 5.9 10*3/uL (ref 4.0–10.5)
nRBC: 0 % (ref 0.0–0.2)

## 2019-04-24 LAB — CMP (CANCER CENTER ONLY)
ALT: 22 U/L (ref 0–44)
AST: 20 U/L (ref 15–41)
Albumin: 3.6 g/dL (ref 3.5–5.0)
Alkaline Phosphatase: 84 U/L (ref 38–126)
Anion gap: 10 (ref 5–15)
BUN: 36 mg/dL — ABNORMAL HIGH (ref 8–23)
CO2: 26 mmol/L (ref 22–32)
Calcium: 9.3 mg/dL (ref 8.9–10.3)
Chloride: 104 mmol/L (ref 98–111)
Creatinine: 1.19 mg/dL (ref 0.61–1.24)
GFR, Est AFR Am: >60 mL/min (ref >60)
GFR, Estimated: 59 mL/min — ABNORMAL LOW (ref >60)
Glucose, Bld: 142 mg/dL — ABNORMAL HIGH (ref 70–99)
Potassium: 3.8 mmol/L (ref 3.5–5.1)
Sodium: 140 mmol/L (ref 135–145)
Total Bilirubin: 0.5 mg/dL (ref 0.3–1.2)
Total Protein: 6.1 g/dL — ABNORMAL LOW (ref 6.5–8.1)

## 2019-04-24 MED ORDER — SODIUM CHLORIDE 0.9 % IV SOLN
Freq: Once | INTRAVENOUS | Status: AC
  Administered 2019-04-24: 14:00:00 via INTRAVENOUS
  Filled 2019-04-24: qty 250

## 2019-04-24 MED ORDER — SODIUM CHLORIDE 0.9 % IV SOLN
200.0000 mg | Freq: Once | INTRAVENOUS | Status: AC
  Administered 2019-04-24: 200 mg via INTRAVENOUS
  Filled 2019-04-24: qty 8

## 2019-04-24 NOTE — Patient Instructions (Signed)
Pembrolizumab injection What is this medicine? PEMBROLIZUMAB (pem broe liz ue mab) is a monoclonal antibody. It is used to treat bladder cancer, cervical cancer, endometrial cancer, esophageal cancer, head and neck cancer, hepatocellular cancer, Hodgkin lymphoma, kidney cancer, lymphoma, melanoma, Merkel cell carcinoma, lung cancer, stomach cancer, urothelial cancer, and cancers that have a certain genetic condition. This medicine may be used for other purposes; ask your health care provider or pharmacist if you have questions. COMMON BRAND NAME(S): Keytruda What should I tell my health care provider before I take this medicine? They need to know if you have any of these conditions:  diabetes  immune system problems  inflammatory bowel disease  liver disease  lung or breathing disease  lupus  received or scheduled to receive an organ transplant or a stem-cell transplant that uses donor stem cells  an unusual or allergic reaction to pembrolizumab, other medicines, foods, dyes, or preservatives  pregnant or trying to get pregnant  breast-feeding How should I use this medicine? This medicine is for infusion into a vein. It is given by a health care professional in a hospital or clinic setting. A special MedGuide will be given to you before each treatment. Be sure to read this information carefully each time. Talk to your pediatrician regarding the use of this medicine in children. While this drug may be prescribed for selected conditions, precautions do apply. Overdosage: If you think you have taken too much of this medicine contact a poison control center or emergency room at once. NOTE: This medicine is only for you. Do not share this medicine with others. What if I miss a dose? It is important not to miss your dose. Call your doctor or health care professional if you are unable to keep an appointment. What may interact with this medicine? Interactions have not been studied. Give  your health care provider a list of all the medicines, herbs, non-prescription drugs, or dietary supplements you use. Also tell them if you smoke, drink alcohol, or use illegal drugs. Some items may interact with your medicine. This list may not describe all possible interactions. Give your health care provider a list of all the medicines, herbs, non-prescription drugs, or dietary supplements you use. Also tell them if you smoke, drink alcohol, or use illegal drugs. Some items may interact with your medicine. What should I watch for while using this medicine? Your condition will be monitored carefully while you are receiving this medicine. You may need blood work done while you are taking this medicine. Do not become pregnant while taking this medicine or for 4 months after stopping it. Women should inform their doctor if they wish to become pregnant or think they might be pregnant. There is a potential for serious side effects to an unborn child. Talk to your health care professional or pharmacist for more information. Do not breast-feed an infant while taking this medicine or for 4 months after the last dose. What side effects may I notice from receiving this medicine? Side effects that you should report to your doctor or health care professional as soon as possible:  allergic reactions like skin rash, itching or hives, swelling of the face, lips, or tongue  bloody or black, tarry  breathing problems  changes in vision  chest pain  chills  confusion  constipation  cough  diarrhea  dizziness or feeling faint or lightheaded  fast or irregular heartbeat  fever  flushing  hair loss  joint pain  low blood counts - this   medicine may decrease the number of white blood cells, red blood cells and platelets. You may be at increased risk for infections and bleeding.  muscle pain  muscle weakness  persistent headache  redness, blistering, peeling or loosening of the skin,  including inside the mouth  signs and symptoms of high blood sugar such as dizziness; dry mouth; dry skin; fruity breath; nausea; stomach pain; increased hunger or thirst; increased urination  signs and symptoms of kidney injury like trouble passing urine or change in the amount of urine  signs and symptoms of liver injury like dark urine, light-colored stools, loss of appetite, nausea, right upper belly pain, yellowing of the eyes or skin  sweating  swollen lymph nodes  weight loss Side effects that usually do not require medical attention (report to your doctor or health care professional if they continue or are bothersome):  decreased appetite  muscle pain  tiredness This list may not describe all possible side effects. Call your doctor for medical advice about side effects. You may report side effects to FDA at 1-800-FDA-1088. Where should I keep my medicine? This drug is given in a hospital or clinic and will not be stored at home. NOTE: This sheet is a summary. It may not cover all possible information. If you have questions about this medicine, talk to your doctor, pharmacist, or health care provider.  2020 Elsevier/Gold Standard (2018-10-04 13:46:58)  

## 2019-04-24 NOTE — Progress Notes (Signed)
Hematology and Oncology Follow Up Visit  [name] [name] [id number] October 31, 1940 78 y.o. 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  He is doing pretty well.  Blood pressure seems to be the real issue.  He says at home, his blood pressure is okay.  He checks his blood pressure daily.  He is on 5 different blood pressure medications.  I am not sure if he watches his diet or salt intake.  He has had no problems with pain.  His left hip and knee bother him quite a bit.  He no longer sees the pain specialist out at [facility].  He has had no headache.  He has had no cough.  There is been no bleeding.  He has had no issues with leg swelling.  He says he urinates quite a bit.  His blood sugars have been on the higher side which might account for some of the urinary frequency.  Overall  his performance status is ECOG 1.   Medications:  Allergies as of 04/24/2019      Reactions   Doxycycline Other (See Comments)   Flushing of hands and face.       Medication List       Accurate as of April 24, 2019  1:34 PM. If you have any questions, ask your nurse or doctor.        amLODipine 10 MG tablet Commonly known as: NORVASC TAKE 1 TABLET BY MOUTH DAILY   Anoro Ellipta 62.5-25 MCG/INH Aepb Generic drug: umeclidinium-vilanterol INHALE 1 PUFF BY MOUTH ONCE DAILY.   clopidogrel 75 MG tablet Commonly known as: PLAVIX TAKE 1 TABLET BY MOUTH EVERY DAY   CONTOUR BLOOD GLUCOSE SYSTEM Devi Diagnoses - Diabetes E 11.8 Check blood sugar once daily   doxazosin 4 MG tablet Commonly known as: CARDURA TAKE 1 TABLET BY MOUTH AT BEDTIME   gabapentin 300 MG capsule Commonly known as:  NEURONTIN One tab PO qHS for a week, then BID for a week, then TID. May double weekly to a max of 3,660m/day. MUST SCHEDULE APPT   glucose blood test strip Commonly known as: BEstate manager/land agentDiagnoses - Diabetes E 11.8 Check blood sugar once daily   hydrALAZINE 25 MG tablet Commonly known as: APRESOLINE Take 1 tablet (25 mg total) by mouth 3 (three) times daily.   Inlyta 5 MG tablet Generic drug: axitinib TAKE 1 TABLET (5MG) BY MOUTH TWICE DAILY FOR 21 DAYS ON, THEN 7 DAYS OFF.   levothyroxine 75 MCG tablet Commonly known as: Synthroid Take 1 tablet (75 mcg total) by mouth daily before breakfast.   lidocaine 5 % Commonly known as: LIDODERM Place onto the skin.   losartan 100 MG tablet Commonly known as: COZAAR TAKE 1 TABLET BY MOUTH EVERY DAY   losartan-hydrochlorothiazide 50-12.5 MG tablet Commonly known as: HYZAAR Take 1 tablet by mouth 2 (two) times a day.   metFORMIN 500 MG 24 hr tablet Commonly known as: GLUCOPHAGE-XR TAKE 2 TABLETS BY MOUTH EVERY DAY WITH BREAKFAST   metoprolol succinate 100 MG 24 hr tablet Commonly known as: TOPROL-XL TAKE 1 TABLET BY MOUTH EVERY DAY   multivitamin with minerals tablet Take 1  tablet by mouth daily.   Oxycodone HCl 10 MG Tabs Take 1 tablet (10 mg total) by mouth every 6 (six) hours as needed.   ProAir HFA 108 (90 Base) MCG/ACT inhaler Generic drug: albuterol INHALE 2 PUFFS INTO THE LUNGS EVERY 6 HOURS AS NEEDED FOR WHEEZING   prochlorperazine 10 MG tablet Commonly known as: COMPAZINE TAKE 1 TABLET(10 MG) BY MOUTH EVERY 6 HOURS AS NEEDED FOR NAUSEA OR VOMITING   Xtampza ER 13.5 MG C12a Generic drug: oxyCODONE ER Take 1 capsule by mouth every 12 (twelve) hours.       Allergies:  Allergies  Allergen Reactions  . Doxycycline Other (See Comments)    Flushing of hands and face.     Past Medical History, Surgical history, Social history, and Family History were reviewed and updated.  Review of Systems: Review  of Systems  Constitutional: Negative.   HENT: Negative.   Eyes: Negative.   Respiratory: Negative.   Cardiovascular: Negative.   Gastrointestinal: Negative.   Genitourinary: Negative.   Musculoskeletal: Negative.   Skin: Negative.   Neurological: Negative.   Endo/Heme/Allergies: Negative.   Psychiatric/Behavioral: Negative.      Physical Exam:  weight is 157 lb (71.2 kg). His oral temperature is 97.5 F (36.4 C) (abnormal). His blood pressure is 202/63 (abnormal) and his pulse is 54 (abnormal). His respiration is 16 and oxygen saturation is 100%.   Wt Readings from Last 3 Encounters:  04/24/19 157 lb (71.2 kg)  04/03/19 160 lb (72.6 kg)  03/30/19 153 lb (69.4 kg)    Physical Exam Vitals signs reviewed.  HENT:     Head: Normocephalic and atraumatic.  Eyes:     Pupils: Pupils are equal, round, and reactive to light.  Neck:     Musculoskeletal: Normal range of motion.  Cardiovascular:     Rate and Rhythm: Normal rate and regular rhythm.     Heart sounds: Normal heart sounds.  Pulmonary:     Effort: Pulmonary effort is normal.     Breath sounds: Normal breath sounds.  Abdominal:     General: Bowel sounds are normal.     Palpations: Abdomen is soft.     Comments: Abdominal exam shows the tic wound in the left lower quadrant.  Is erythematous.  Is slightly firm.  It probably measures about 1 cm x 1.5 cm.  There is no hepatomegaly.  There is no splenomegaly.  Musculoskeletal: Normal range of motion.        General: No tenderness or deformity.  Lymphadenopathy:     Cervical: No cervical adenopathy.  Skin:    General: Skin is warm and dry.     Findings: No erythema or rash.  Neurological:     Mental Status: He is alert and oriented to person, place, and time.  Psychiatric:        Behavior: Behavior normal.        Thought Content: Thought content normal.        Judgment: Judgment normal.       Lab Results  Component Value Date   WBC 5.9 04/24/2019   HGB 14.2  04/24/2019   HCT 44.0 04/24/2019   MCV 92.4 04/24/2019   PLT 149 (L) 04/24/2019   Lab Results  Component Value Date   FERRITIN 95 10/01/2017   Lab Results  Component Value Date   RBC 4.76 04/24/2019   Lab Results  Component Value Date   KPAFRELGTCHN 28.2 (H) 05/05/2018   LAMBDASER 24.6 05/05/2018   KAPLAMBRATIO  5.75 05/09/2018   Lab Results  Component Value Date   IGGSERUM 799 05/05/2018   IGA 229 05/05/2018   IGMSERUM 136 05/05/2018   Lab Results  Component Value Date   TOTALPROTELP 6.6 05/05/2018   ALBUMINELP 3.4 05/05/2018   A1GS 0.3 05/05/2018   A2GS 1.1 (H) 05/05/2018   BETS 0.9 05/05/2018   BETA2SER 0.4 04/29/2018   GAMS 0.8 05/05/2018   MSPIKE Not Observed 05/05/2018   SPEI  04/29/2018     Comment:     . Evaluation is consistent with an acute inflammatory  pattern. . . A faint abnormal protein band is detected in the gamma globulins that most likely represents CRP, or circulating immune complexes, rather than a monoclonal immunoglobulin. . . Immunofixation analysis is available if identification of the band(s) is clinically indicated. .      Chemistry      Component Value Date/Time   NA 140 04/24/2019 1224   NA 140 11/25/2018 1055   K 3.8 04/24/2019 1224   CL 104 04/24/2019 1224   CO2 26 04/24/2019 1224   BUN 36 (H) 04/24/2019 1224   BUN 20 11/25/2018 1055   CREATININE 1.19 04/24/2019 1224   CREATININE 1.11 04/29/2018 1047      Component Value Date/Time   CALCIUM 9.3 04/24/2019 1224   ALKPHOS 84 04/24/2019 1224   AST 20 04/24/2019 1224   ALT 22 04/24/2019 1224   BILITOT 0.5 04/24/2019 1224      Impression and Plan: Mr. Zahler is a very pleasant 78 yo caucasian gentleman with metastatic kidney cancer. He also has history of prostate cancer and melanoma in situ.   I am happy that his cancer is holding nice and steady.  He really has done well with the Inlyta and pembrolizumab.  We will go ahead with another treatment today.  I told  him to take his blood pressure 3 times a day.  I told him to make sure that he record the blood pressure and then we will see him back in 3 weeks, we will see how his blood pressure trends.  [name], MD 8/3/20201:34 PM

## 2019-04-24 NOTE — Telephone Encounter (Signed)

## 2019-04-25 ENCOUNTER — Encounter: Payer: Self-pay | Admitting: Cardiology

## 2019-04-25 ENCOUNTER — Ambulatory Visit (INDEPENDENT_AMBULATORY_CARE_PROVIDER_SITE_OTHER): Payer: Medicare Other | Admitting: Cardiology

## 2019-04-25 VITALS — BP 158/76 | HR 68 | Ht 70.0 in | Wt 156.0 lb

## 2019-04-25 DIAGNOSIS — I471 Supraventricular tachycardia, unspecified: Secondary | ICD-10-CM

## 2019-04-25 MED ORDER — CARVEDILOL 25 MG PO TABS: 25.0000 mg | ORAL_TABLET | Freq: Two times a day (BID) | ORAL | 2 refills | Status: AC

## 2019-04-25 NOTE — Patient Instructions (Signed)
Medication Instructions:  Your physician has recommended you make the following change in your medication:  1. STOP Metoprolol 2. START Carvedilol 25 mg twice daily  * If you need a refill on your cardiac medications before your next appointment, please call your pharmacy.   Labwork: None ordered  Testing/Procedures: None ordered  Follow-Up: Your physician wants you to follow-up in: 6 months with Dr. Curt Bears.  You will receive a reminder letter in the mail two months in advance. If you don't receive a letter, please call our office to schedule the follow-up appointment.   Thank you for choosing [facility]!!   Trinidad Curet, RN [phone]  Any Other Special Instructions Will Be Listed Below (If Applicable).

## 2019-04-25 NOTE — Progress Notes (Signed)
Electrophysiology Office Note   Date:  04/25/2019   ID:  [name] [name], DOB [birth year], MRN [id number]  PCP:  Hali Marry, MD  Cardiologist:  Johnsie Cancel Primary Electrophysiologist:  Ayelen Sciortino Meredith Leeds, MD    No chief complaint on file.    History of Present Illness: [name] [name] is a 78 y.o. male who is being seen today for the evaluation of SVT at the request of Hali Marry, *. Presenting today for electrophysiology evaluation.  Has a history of coronary artery disease, hypertension, hyperlipidemia.  He has a history of SVT.  He was seen at [facility] 11/19/2016 with SVT which converted with adenosine.  He states that he has an episode of SVT a few times a year.  Usually it terminates with vagal maneuvers, but he has required adenosine in the past.    Today, denies symptoms of palpitations, chest pain, shortness of breath, orthopnea, PND, lower extremity edema, claudication, dizziness, presyncope, syncope, bleeding, or neurologic sequela. The patient is tolerating medications without difficulties.  Overall he is doing well.  He has no chest pain or shortness of breath.  He is noted no further episodes of SVT.  His blood pressure medications have been adjusted and his metoprolol has been increased.   Past Medical History:  Diagnosis Date  . Allergic rhinitis   . Bigeminy   . Bradycardia   . Bruit   . CAD (coronary artery disease)   . Claudication ([place])   . Diabetes mellitus type II   . Dyspnea   . Goals of care, counseling/discussion 06/02/2018  . History of prostate cancer    Dr. Gaynelle Arabian  . Hypercholesteremia    II A  . Hypertension   . Kidney cancer, primary, with metastasis from kidney to other site, right ([place]) 06/02/2018  . Malignant neoplasm of prostate ([place])   . Microalbuminuria   . Seborrheic keratosis    Past Surgical History:  Procedure Laterality Date  . CORONARY ANGIOPLASTY WITH STENT PLACEMENT    . IR FLUORO RM 30-60 MIN  05/24/2018     Current  Outpatient Medications  Medication Sig Dispense Refill  . amLODipine (NORVASC) 10 MG tablet TAKE 1 TABLET BY MOUTH DAILY 90 tablet 1  . ANORO ELLIPTA 62.5-25 MCG/INH AEPB INHALE 1 PUFF BY MOUTH ONCE DAILY. 60 each 3  . Blood Glucose Monitoring Suppl (CONTOUR BLOOD GLUCOSE SYSTEM) DEVI Diagnoses - Diabetes E 11.8 Check blood sugar once daily 1 Device 0  . clopidogrel (PLAVIX) 75 MG tablet TAKE 1 TABLET BY MOUTH EVERY DAY 90 tablet 3  . doxazosin (CARDURA) 4 MG tablet TAKE 1 TABLET BY MOUTH AT BEDTIME 90 tablet 3  . gabapentin (NEURONTIN) 300 MG capsule One tab PO qHS for a week, then BID for a week, then TID. May double weekly to a max of 3,600mg /day. MUST SCHEDULE APPT 90 capsule 0  . glucose blood (BAYER CONTOUR TEST) test strip Diagnoses - Diabetes E 11.8 Check blood sugar once daily 100 each prn  . hydrALAZINE (APRESOLINE) 25 MG tablet Take 1 tablet (25 mg total) by mouth 3 (three) times daily. 90 tablet 4  . INLYTA 5 MG tablet TAKE 1 TABLET (5MG ) BY MOUTH TWICE DAILY FOR 21 DAYS ON, THEN 7 DAYS OFF. 42 tablet 3  . levothyroxine (SYNTHROID) 75 MCG tablet Take 1 tablet (75 mcg total) by mouth daily before breakfast. 30 tablet 4  . losartan-hydrochlorothiazide (HYZAAR) 50-12.5 MG tablet Take 1 tablet by mouth 2 (two) times a day. [place]  tablet 3  . metFORMIN (GLUCOPHAGE-XR) 500 MG 24 hr tablet TAKE 2 TABLETS BY MOUTH EVERY DAY WITH BREAKFAST 180 tablet 1  . metoprolol succinate (TOPROL-XL) 100 MG 24 hr tablet TAKE 1 TABLET BY MOUTH EVERY DAY 90 tablet 1  . Multiple Vitamins-Minerals (MULTIVITAMIN WITH MINERALS) tablet Take 1 tablet by mouth daily.    . Oxycodone HCl 10 MG TABS Take 1 tablet (10 mg total) by mouth every 6 (six) hours as needed. 90 tablet 0  . PROAIR HFA 108 (90 Base) MCG/ACT inhaler INHALE 2 PUFFS INTO THE LUNGS EVERY 6 HOURS AS NEEDED FOR WHEEZING 8.5 g 2  . prochlorperazine (COMPAZINE) 10 MG tablet TAKE 1 TABLET(10 MG) BY MOUTH EVERY 6 HOURS AS NEEDED FOR NAUSEA OR VOMITING 385  tablet 1  . XTAMPZA ER 13.5 MG C12A Take 1 capsule by mouth every 12 (twelve) hours. 60 capsule 0   No current facility-administered medications for this visit.     Allergies:   Doxycycline   Social History:  The patient  reports that he quit smoking about 16 years ago. He has a 20.00 pack-year smoking history. He has never used smokeless tobacco. He reports previous alcohol use of about 1.0 standard drinks of alcohol per week. He reports that he does not use drugs.   Family History:  The patient's family history includes Coronary artery disease in his brother; Heart attack in his father.   ROS:  Please see the history of present illness.   Otherwise, review of systems is positive for none.   All other systems are reviewed and negative.   PHYSICAL EXAM: VS:  BP (!) 158/76   Pulse 68   Ht 5\' 10"  (1.778 m)   Wt 156 lb (70.8 kg)   BMI 22.38 kg/m  , BMI Body mass index is 22.38 kg/m. GEN: Well nourished, well developed, in no acute distress  HEENT: normal  Neck: no JVD, carotid bruits, or masses Cardiac: RRR; no murmurs, rubs, or gallops,no edema  Respiratory:  clear to auscultation bilaterally, normal work of breathing GI: soft, nontender, nondistended, + BS MS: no deformity or atrophy  Skin: warm and dry Neuro:  Strength and sensation are intact Psych: euthymic mood, full affect  EKG:  EKG is not ordered today. Personal review of the ekg ordered 11/25/2018 shows sinus rhythm, right bundle branch block  Recent Labs: 01/26/2019: TSH 21.820 04/24/2019: ALT 22; BUN 36; Creatinine 1.19; Hemoglobin 14.2; Platelet Count 149; Potassium 3.8; Sodium 140    Lipid Panel     Component Value Date/Time   CHOL 89 04/14/2017 0733   TRIG 94 04/14/2017 0733   HDL 28 (L) 04/14/2017 0733   CHOLHDL 3.2 04/14/2017 0733   VLDL 31 (H) 09/09/2015 1218   LDLCALC 69 09/09/2015 1218     Wt Readings from Last 3 Encounters:  04/25/19 156 lb (70.8 kg)  04/24/19 157 lb (71.2 kg)  04/03/19 160 lb (72.6  kg)      Other studies Reviewed: Additional studies/ records that were reviewed today include: epic notes  ASSESSMENT AND PLAN:  1.  SVT: Currently on beta-blockers.  He is continued to have episodes of SVT.  Offered him ablation, but he has not had any further symptoms of SVT since his beta-blocker was increased.  He is having some elevated blood pressures.  We [name] [name] switch him from metoprolol to carvedilol.  2.  Coronary artery disease: Post OM stent in 1996.  Plan per primary cardiology.  3.  Hyperlipidemia: Plan per  primary physician and primary cardiologist  4.  Hypertension: Significantly elevated today.  He is working with his primary physician to get it better controlled.  We [name] [name] switch him from metoprolol to carvedilol which may improve his blood pressure control.    Current medicines are reviewed at length with the patient today.   The patient does not have concerns regarding his medicines.  The following changes were made today: Stop metoprolol, start carvedilol 25 mg twice a day  Labs/ tests ordered today include:  No orders of the defined types were placed in this encounter.    Disposition:   FU with [name] [name] 6 months  Signed, Dietrich Ke Meredith Leeds, MD  04/25/2019 10:56 AM     [facility] [address] [place] [address] [phone] (office) [phone] (fax)

## 2019-04-25 NOTE — Addendum Note (Signed)
Addended by: Stanton Kidney on: 04/25/2019 11:10 AM   Modules accepted: Orders

## 2019-04-28 ENCOUNTER — Other Ambulatory Visit: Payer: Self-pay | Admitting: Family Medicine

## 2019-05-02 MED FILL — INLYTA 5 MG TABLET: 5 | 28 days supply | Qty: 42 | Fill #2

## 2019-05-05 ENCOUNTER — Ambulatory Visit (INDEPENDENT_AMBULATORY_CARE_PROVIDER_SITE_OTHER): Payer: Medicare Other | Admitting: Family Medicine

## 2019-05-05 ENCOUNTER — Encounter: Payer: Self-pay | Admitting: Family Medicine

## 2019-05-05 ENCOUNTER — Other Ambulatory Visit: Payer: Self-pay

## 2019-05-05 VITALS — BP 132/68 | HR 65 | Temp 98.2°F | Ht 73.0 in | Wt 160.0 lb

## 2019-05-05 DIAGNOSIS — Z23 Encounter for immunization: Secondary | ICD-10-CM | POA: Diagnosis not present

## 2019-05-05 DIAGNOSIS — J449 Chronic obstructive pulmonary disease, unspecified: Secondary | ICD-10-CM | POA: Diagnosis not present

## 2019-05-05 DIAGNOSIS — L600 Ingrowing nail: Secondary | ICD-10-CM

## 2019-05-05 DIAGNOSIS — I10 Essential (primary) hypertension: Secondary | ICD-10-CM | POA: Diagnosis not present

## 2019-05-05 MED ORDER — AMBULATORY NON FORMULARY MEDICATION: 0 refills | Status: DC

## 2019-05-05 NOTE — Assessment & Plan Note (Signed)
Blood pressure looks great today he has been monitoring at home and has been getting some good numbers at home as well.

## 2019-05-05 NOTE — Assessment & Plan Note (Signed)
Continue with current inhaler regimen.  Lungs are clear except for slight expiratory wheeze in the left midlung.  I do not see any sign of volume overload on exam today.  I would like to get an overnight pulse oximetry as I do find it interesting that most of his symptoms are when he first wakes up in the morning.  He does not feel like it is related to any chest wall pain.

## 2019-05-05 NOTE — Progress Notes (Signed)
Acute Office Visit  Subjective:    Patient ID: [name] [name], male    DOB: 1941/01/25, 78 y.o.   MRN: [id number]  Chief Complaint  Patient presents with  . Foot Pain    HPI Patient is in today for problems with his left foot. He wants his toenails looked at too.  We had previously talked some cotton into the medial border of the great toe on the left side.  He went to get his nails clipped and they accidentally pulled part of it out.  Ultimately probably needs a partial nail removal but we had done this temporarily to see if it would help as he does a lot of driving and traveling for work.  He actually will be out of town again doing a lot of driving next week.  He wanted me to look at it today.  He says since placing the cotton underneath the nail it really has done great and his pain has improved significantly and with the improvement of pain in his toe the pain in his lower leg has improved significantly as well.  He also wanted to discuss his lungs.  He says he just feels like they are little bit more tight and little bit more short of breath when he first wakes up in the morning but then after he gets going he actually feels better.  He is not been noticing it for a few weeks.  He is noticed a little bit more heavy sputum production but no cough.  And no discoloration of sputum.  No fever sweats or chills.  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  Past Medical History:  Diagnosis Date  . Allergic rhinitis   . Bigeminy   . Bradycardia   . Bruit   . CAD (coronary artery disease)   . Claudication ([place])   . Diabetes mellitus type II   . Dyspnea   . Goals of care, counseling/discussion 06/02/2018  . History of prostate cancer    Dr. Gaynelle Arabian  . Hypercholesteremia    II A  . Hypertension   . Kidney cancer, primary, with metastasis from kidney to other site, right ([place]) 06/02/2018  . Malignant neoplasm of prostate ([place])   . Microalbuminuria   . Seborrheic keratosis     Past Surgical  History:  Procedure Laterality Date  . CORONARY ANGIOPLASTY WITH STENT PLACEMENT    . IR FLUORO RM 30-60 MIN  05/24/2018    Family History  Problem Relation Age of Onset  . Heart attack Father        Acute MI  . Coronary artery disease Brother     Social History   Socioeconomic History  . Marital status: Widowed    Spouse name: Not on file  . Number of children: Not on file  . Years of education: Not on file  . Highest education level: Not on file  Occupational History  . Occupation: Retired but still works Dentist: RETIRED  Social Needs  . Financial resource strain: Not on file  . Food insecurity    Worry: Not on file    Inability: Not on file  . Transportation needs    Medical: Not on file    Non-medical: Not on file  Tobacco Use  . Smoking status: Former Smoker    Packs/day: 1.00    Years: 20.00    Pack years: 20.00    Quit date: 04/22/2003    Years since quitting: 16.0  .  Smokeless tobacco: Never Used  Substance and Sexual Activity  . Alcohol use: Not Currently    Alcohol/week: 1.0 standard drinks    Types: 1 Standard drinks or equivalent per week  . Drug use: Never  . Sexual activity: Not Currently  Lifestyle  . Physical activity    Days per week: Not on file    Minutes per session: Not on file  . Stress: Not on file  Relationships  . Social Herbalist on phone: Not on file    Gets together: Not on file    Attends religious service: Not on file    Active member of club or organization: Not on file    Attends meetings of clubs or organizations: Not on file    Relationship status: Not on file  . Intimate partner violence    Fear of current or ex partner: Not on file    Emotionally abused: Not on file    Physically abused: Not on file    Forced sexual activity: Not on file  Other Topics Concern  . Not on file  Social History Narrative   Widowed 08/2009   Has 4 kids, 2 are local   Fair diet   No regular exercise     Outpatient Medications Prior to Visit  Medication Sig Dispense Refill  . amLODipine (NORVASC) 10 MG tablet TAKE 1 TABLET BY MOUTH DAILY 90 tablet 1  . ANORO ELLIPTA 62.5-25 MCG/INH AEPB INHALE 1 PUFF BY MOUTH ONCE DAILY. 60 each 3  . Blood Glucose Monitoring Suppl (CONTOUR BLOOD GLUCOSE SYSTEM) DEVI Diagnoses - Diabetes E 11.8 Check blood sugar once daily 1 Device 0  . carvedilol (COREG) 25 MG tablet Take 1 tablet (25 mg total) by mouth 2 (two) times daily. 180 tablet 2  . clopidogrel (PLAVIX) 75 MG tablet TAKE 1 TABLET BY MOUTH EVERY DAY 90 tablet 3  . doxazosin (CARDURA) 4 MG tablet TAKE 1 TABLET BY MOUTH AT BEDTIME 90 tablet 3  . gabapentin (NEURONTIN) 300 MG capsule One tab PO qHS for a week, then BID for a week, then TID. May double weekly to a max of 3,600mg /day. MUST SCHEDULE APPT 90 capsule 0  . glucose blood (BAYER CONTOUR TEST) test strip Diagnoses - Diabetes E 11.8 Check blood sugar once daily 100 each prn  . hydrALAZINE (APRESOLINE) 25 MG tablet Take 1 tablet (25 mg total) by mouth 3 (three) times daily. 90 tablet 4  . INLYTA 5 MG tablet TAKE 1 TABLET (5MG ) BY MOUTH TWICE DAILY FOR 21 DAYS ON, THEN 7 DAYS OFF. 42 tablet 3  . levothyroxine (SYNTHROID) 75 MCG tablet Take 1 tablet (75 mcg total) by mouth daily before breakfast. 30 tablet 4  . losartan-hydrochlorothiazide (HYZAAR) 50-12.5 MG tablet Take 1 tablet by mouth 2 (two) times a day. 60 tablet 3  . metFORMIN (GLUCOPHAGE-XR) 500 MG 24 hr tablet TAKE 2 TABLETS BY MOUTH EVERY DAY WITH BREAKFAST 180 tablet 1  . Multiple Vitamins-Minerals (MULTIVITAMIN WITH MINERALS) tablet Take 1 tablet by mouth daily.    . Oxycodone HCl 10 MG TABS Take 1 tablet (10 mg total) by mouth every 6 (six) hours as needed. 90 tablet 0  . PROAIR HFA 108 (90 Base) MCG/ACT inhaler INHALE 2 PUFFS BY MOUTH INTO THE LUNGS EVERY 6 HOURS AS DIRECTED AS NEEDED FOR WHEEZING 8.5 g 2  . prochlorperazine (COMPAZINE) 10 MG tablet TAKE 1 TABLET(10 MG) BY MOUTH EVERY 6  HOURS AS NEEDED FOR NAUSEA OR VOMITING 385  tablet 1  . XTAMPZA ER 13.5 MG C12A Take 1 capsule by mouth every 12 (twelve) hours. 60 capsule 0   No facility-administered medications prior to visit.     Allergies  Allergen Reactions  . Doxycycline Other (See Comments)    Flushing of hands and face.     ROS     Objective:    Physical Exam  Constitutional: He is oriented to person, place, and time. He appears well-developed and well-nourished.  HENT:  Head: Normocephalic and atraumatic.  Cardiovascular: Normal rate, regular rhythm and normal heart sounds.  Pulmonary/Chest: Effort normal and breath sounds normal.  Slight expiratory wheeze in the left midlung.  Neurological: He is alert and oriented to person, place, and time.  Skin: Skin is warm and dry.  Examined his toe overall it actually looked pretty good.  I was just able to touch some of the cotton back underneath the edge of the nail without numbing the area and just trimmed off a little bit of excess.  Psychiatric: He has a normal mood and affect. His behavior is normal.    BP 132/68   Pulse 65   Temp 98.2 F (36.8 C)   Ht 6\' 1"  (1.854 m)   Wt 160 lb (72.6 kg)   SpO2 98%   BMI 21.11 kg/m  Wt Readings from Last 3 Encounters:  05/05/19 160 lb (72.6 kg)  04/25/19 156 lb (70.8 kg)  04/24/19 157 lb (71.2 kg)    Health Maintenance Due  Topic Date Due  . FOOT EXAM  02/22/2019  . INFLUENZA VACCINE  04/22/2019    There are no preventive care reminders to display for this patient.   Lab Results  Component Value Date   TSH 21.820 (H) 01/26/2019   Lab Results  Component Value Date   WBC 5.9 04/24/2019   HGB 14.2 04/24/2019   HCT 44.0 04/24/2019   MCV 92.4 04/24/2019   PLT 149 (L) 04/24/2019   Lab Results  Component Value Date   NA 140 04/24/2019   K 3.8 04/24/2019   CO2 26 04/24/2019   GLUCOSE 142 (H) 04/24/2019   BUN 36 (H) 04/24/2019   CREATININE 1.19 04/24/2019   BILITOT 0.5 04/24/2019   ALKPHOS 84  04/24/2019   AST 20 04/24/2019   ALT 22 04/24/2019   PROT 6.1 (L) 04/24/2019   ALBUMIN 3.6 04/24/2019   CALCIUM 9.3 04/24/2019   ANIONGAP 10 04/24/2019   Lab Results  Component Value Date   CHOL 89 04/14/2017   Lab Results  Component Value Date   HDL 28 (L) 04/14/2017   Lab Results  Component Value Date   LDLCALC 69 09/09/2015   Lab Results  Component Value Date   TRIG 94 04/14/2017   Lab Results  Component Value Date   CHOLHDL 3.2 04/14/2017   Lab Results  Component Value Date   HGBA1C 5.8 (A) 02/20/2019       Assessment & Plan:   Problem List Items Addressed This Visit      Cardiovascular and Mediastinum   Essential hypertension    Blood pressure looks great today he has been monitoring at home and has been getting some good numbers at home as well.        Respiratory   COPD, severe ([place])    Continue with current inhaler regimen.  Lungs are clear except for slight expiratory wheeze in the left midlung.  I do not see any sign of volume overload on exam today.  I  would like to get an overnight pulse oximetry as I do find it interesting that most of his symptoms are when he first wakes up in the morning.  He does not feel like it is related to any chest wall pain.      Relevant Medications   AMBULATORY NON FORMULARY MEDICATION    Other Visit Diagnoses    Ingrown nail of great toe of left foot    -  Primary     Ingrown nail-discussed options including just trying to tuck the cotton back underneath the nail versus going ahead and doing a partial nail removal.  Because is going to be doing a lot of traveling and driving next week he opted to just have the contrast pushed back underneath for now.  Patient tolerated well and we just trimmed off excess.  Meds ordered this encounter  Medications  . AMBULATORY NON FORMULARY MEDICATION    Sig: Medication Name: Overnight pulse ox. Please fax to [facility]. Dx of COPD and SOB at night.    Dispense:  1 vial    Refill:  0      Beatrice Lecher, MD

## 2019-05-09 DIAGNOSIS — Z23 Encounter for immunization: Secondary | ICD-10-CM | POA: Diagnosis not present

## 2019-05-09 DIAGNOSIS — L600 Ingrowing nail: Secondary | ICD-10-CM | POA: Diagnosis not present

## 2019-05-11 ENCOUNTER — Other Ambulatory Visit: Payer: Self-pay | Admitting: *Deleted

## 2019-05-11 ENCOUNTER — Other Ambulatory Visit: Payer: Self-pay | Admitting: Hematology & Oncology

## 2019-05-11 DIAGNOSIS — E032 Hypothyroidism due to medicaments and other exogenous substances: Secondary | ICD-10-CM

## 2019-05-11 MED ORDER — XTAMPZA ER 13.5 MG PO C12A: 1.0000 | EXTENDED_RELEASE_CAPSULE | Freq: Two times a day (BID) | ORAL | 0 refills | Status: AC

## 2019-05-15 ENCOUNTER — Inpatient Hospital Stay: Payer: Medicare Other

## 2019-05-15 ENCOUNTER — Other Ambulatory Visit: Payer: Self-pay

## 2019-05-15 ENCOUNTER — Encounter: Payer: Self-pay | Admitting: Hematology & Oncology

## 2019-05-15 ENCOUNTER — Inpatient Hospital Stay (HOSPITAL_BASED_OUTPATIENT_CLINIC_OR_DEPARTMENT_OTHER): Payer: Medicare Other | Admitting: Hematology & Oncology

## 2019-05-15 VITALS — BP 155/69 | HR 59 | Temp 97.7°F | Resp 20 | Wt 155.0 lb

## 2019-05-15 DIAGNOSIS — C7951 Secondary malignant neoplasm of bone: Secondary | ICD-10-CM | POA: Diagnosis not present

## 2019-05-15 DIAGNOSIS — C641 Malignant neoplasm of right kidney, except renal pelvis: Secondary | ICD-10-CM

## 2019-05-15 DIAGNOSIS — Z5112 Encounter for antineoplastic immunotherapy: Secondary | ICD-10-CM | POA: Diagnosis not present

## 2019-05-15 LAB — CBC WITH DIFFERENTIAL (CANCER CENTER ONLY)
Abs Immature Granulocytes: 0.02 10*3/uL (ref 0.00–0.07)
Basophils Absolute: 0.1 10*3/uL (ref 0.0–0.1)
Basophils Relative: 1 %
Eosinophils Absolute: 1 10*3/uL — ABNORMAL HIGH (ref 0.0–0.5)
Eosinophils Relative: 13 %
HCT: 42.9 % (ref 39.0–52.0)
Hemoglobin: 14.1 g/dL (ref 13.0–17.0)
Immature Granulocytes: 0 %
Lymphocytes Relative: 22 %
Lymphs Abs: 1.6 10*3/uL (ref 0.7–4.0)
MCH: 30 pg (ref 26.0–34.0)
MCHC: 32.9 g/dL (ref 30.0–36.0)
MCV: 91.3 fL (ref 80.0–100.0)
Monocytes Absolute: 0.6 10*3/uL (ref 0.1–1.0)
Monocytes Relative: 8 %
Neutro Abs: 4.2 10*3/uL (ref 1.7–7.7)
Neutrophils Relative %: 56 %
Platelet Count: 163 10*3/uL (ref 150–400)
RBC: 4.7 MIL/uL (ref 4.22–5.81)
RDW: 14.6 % (ref 11.5–15.5)
WBC Count: 7.5 10*3/uL (ref 4.0–10.5)
nRBC: 0 % (ref 0.0–0.2)

## 2019-05-15 LAB — LACTATE DEHYDROGENASE: LDH: 178 U/L (ref 98–192)

## 2019-05-15 LAB — CMP (CANCER CENTER ONLY)
ALT: 20 U/L (ref 0–44)
AST: 20 U/L (ref 15–41)
Albumin: 3.5 g/dL (ref 3.5–5.0)
Alkaline Phosphatase: 88 U/L (ref 38–126)
Anion gap: 10 (ref 5–15)
BUN: 31 mg/dL — ABNORMAL HIGH (ref 8–23)
CO2: 30 mmol/L (ref 22–32)
Calcium: 9.1 mg/dL (ref 8.9–10.3)
Chloride: 99 mmol/L (ref 98–111)
Creatinine: 1.56 mg/dL — ABNORMAL HIGH (ref 0.61–1.24)
GFR, Est AFR Am: 49 mL/min — ABNORMAL LOW (ref >60)
GFR, Estimated: 42 mL/min — ABNORMAL LOW (ref >60)
Glucose, Bld: 128 mg/dL — ABNORMAL HIGH (ref 70–99)
Potassium: 4.3 mmol/L (ref 3.5–5.1)
Sodium: 139 mmol/L (ref 135–145)
Total Bilirubin: 0.7 mg/dL (ref 0.3–1.2)
Total Protein: 6.7 g/dL (ref 6.5–8.1)

## 2019-05-15 MED ORDER — SODIUM CHLORIDE 0.9 % IV SOLN
Freq: Once | INTRAVENOUS | Status: AC
  Administered 2019-05-15: 12:00:00 via INTRAVENOUS
  Filled 2019-05-15: qty 250

## 2019-05-15 MED ORDER — SODIUM CHLORIDE 0.9 % IV SOLN
200.0000 mg | Freq: Once | INTRAVENOUS | Status: AC
  Administered 2019-05-15: 13:00:00 200 mg via INTRAVENOUS
  Filled 2019-05-15: qty 8

## 2019-05-15 NOTE — Progress Notes (Addendum)
Hematology and Oncology Follow Up Visit  BRODAN GREWELL [id number] [birth year] 78 y.o. 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 Uy is here today for follow-up.  He is doing pretty well.  He is still saw him rope.  The doses almost every day.  He recently was up in [place], [place].  He apparently has a good customer up there.  He is still complaining about urinary frequency.  I know he sees a urologist.,  Sure what else they can do about this.  He is doing okay with his blood pressure.  He gave me a list of his blood pressure recordings since we last saw him.  They actually were doing quite well.  He is managing the Inlyta without any difficulties.  Is not having any diarrhea.  There is no headache.  He has had no rashes.  He has had no nausea or vomiting.  Overall, I would have to say his performance status is ECOG 1..  Medications:  Allergies as of 05/15/2019      Reactions   Doxycycline Other (See Comments)   Flushing of hands and face.       Medication List       Accurate as of May 15, 2019 11:38 AM. If you have any questions, ask your nurse or doctor.        AMBULATORY NON FORMULARY MEDICATION Medication Name: Overnight pulse ox. Please fax to [facility]. Dx of COPD and SOB at night.   amLODipine 10 MG tablet Commonly known as: NORVASC TAKE 1 TABLET BY MOUTH DAILY   Anoro Ellipta 62.5-25 MCG/INH Aepb Generic drug: umeclidinium-vilanterol INHALE 1 PUFF BY MOUTH ONCE DAILY.   carvedilol 25 MG tablet Commonly known as: COREG Take 1 tablet (25 mg total) by mouth 2 (two) times daily.   clopidogrel 75 MG tablet Commonly known as: PLAVIX TAKE 1 TABLET BY MOUTH EVERY DAY   CONTOUR BLOOD  GLUCOSE SYSTEM Devi Diagnoses - Diabetes E 11.8 Check blood sugar once daily   doxazosin 4 MG tablet Commonly known as: CARDURA TAKE 1 TABLET BY MOUTH AT BEDTIME   gabapentin 300 MG capsule Commonly known as: NEURONTIN One tab PO qHS for a week, then BID for a week, then TID. May double weekly to a max of 3,677m/day. MUST SCHEDULE APPT   glucose blood test strip Commonly known as: BEstate manager/land agentDiagnoses - Diabetes E 11.8 Check blood sugar once daily   hydrALAZINE 25 MG tablet Commonly known as: APRESOLINE Take 1 tablet (25 mg total) by mouth 3 (three) times daily.   Inlyta 5 MG tablet Generic drug: axitinib TAKE 1 TABLET (5MG) BY MOUTH TWICE DAILY FOR 21 DAYS ON, THEN 7 DAYS OFF.   levothyroxine 75 MCG tablet Commonly known as: SYNTHROID TAKE 1 TABLET(75 MCG) BY MOUTH DAILY BEFORE BREAKFAST   losartan-hydrochlorothiazide 50-12.5 MG tablet Commonly known as: HYZAAR Take 1 tablet by mouth 2 (two) times a day.   metFORMIN 500 MG 24 hr tablet Commonly known as: GLUCOPHAGE-XR TAKE 2 TABLETS BY MOUTH EVERY DAY WITH BREAKFAST   multivitamin with minerals tablet Take 1 tablet by mouth daily.   Oxycodone HCl 10 MG Tabs Take 1 tablet (10 mg total) by mouth  every 6 (six) hours as needed.   ProAir HFA 108 (90 Base) MCG/ACT inhaler Generic drug: albuterol INHALE 2 PUFFS BY MOUTH INTO THE LUNGS EVERY 6 HOURS AS DIRECTED AS NEEDED FOR WHEEZING   prochlorperazine 10 MG tablet Commonly known as: COMPAZINE TAKE 1 TABLET(10 MG) BY MOUTH EVERY 6 HOURS AS NEEDED FOR NAUSEA OR VOMITING   Xtampza ER 13.5 MG C12a Generic drug: oxyCODONE ER Take 1 capsule by mouth every 12 (twelve) hours.       Allergies:  Allergies  Allergen Reactions  . Doxycycline Other (See Comments)    Flushing of hands and face.     Past Medical History, Surgical history, Social history, and Family History were reviewed and updated.  Review of Systems: Review of Systems  Constitutional:  Negative.   HENT: Negative.   Eyes: Negative.   Respiratory: Negative.   Cardiovascular: Negative.   Gastrointestinal: Negative.   Genitourinary: Negative.   Musculoskeletal: Negative.   Skin: Negative.   Neurological: Negative.   Endo/Heme/Allergies: Negative.   Psychiatric/Behavioral: Negative.      Physical Exam:  weight is 155 lb (70.3 kg). His oral temperature is 97.7 F (36.5 C). His blood pressure is 155/69 (abnormal) and his pulse is 59 (abnormal). His respiration is 20 and oxygen saturation is 97%.   Wt Readings from Last 3 Encounters:  05/15/19 155 lb (70.3 kg)  05/05/19 160 lb (72.6 kg)  04/25/19 156 lb (70.8 kg)    Physical Exam Vitals signs reviewed.  HENT:     Head: Normocephalic and atraumatic.  Eyes:     Pupils: Pupils are equal, round, and reactive to light.  Neck:     Musculoskeletal: Normal range of motion.  Cardiovascular:     Rate and Rhythm: Normal rate and regular rhythm.     Heart sounds: Normal heart sounds.  Pulmonary:     Effort: Pulmonary effort is normal.     Breath sounds: Normal breath sounds.  Abdominal:     General: Bowel sounds are normal.     Palpations: Abdomen is soft.     Comments: Abdominal exam shows the tic wound in the left lower quadrant.  Is erythematous.  Is slightly firm.  It probably measures about 1 cm x 1.5 cm.  There is no hepatomegaly.  There is no splenomegaly.  Musculoskeletal: Normal range of motion.        General: No tenderness or deformity.  Lymphadenopathy:     Cervical: No cervical adenopathy.  Skin:    General: Skin is warm and dry.     Findings: No erythema or rash.  Neurological:     Mental Status: He is alert and oriented to person, place, and time.  Psychiatric:        Behavior: Behavior normal.        Thought Content: Thought content normal.        Judgment: Judgment normal.       Lab Results  Component Value Date   WBC 7.5 05/15/2019   HGB 14.1 05/15/2019   HCT 42.9 05/15/2019   MCV 91.3  05/15/2019   PLT 163 05/15/2019   Lab Results  Component Value Date   FERRITIN 95 10/01/2017   Lab Results  Component Value Date   RBC 4.70 05/15/2019   Lab Results  Component Value Date   KPAFRELGTCHN 28.2 (H) 05/05/2018   LAMBDASER 24.6 05/05/2018   KAPLAMBRATIO 5.75 05/09/2018   Lab Results  Component Value Date   IGGSERUM 799 05/05/2018  IGA 229 05/05/2018   IGMSERUM 136 05/05/2018   Lab Results  Component Value Date   TOTALPROTELP 6.6 05/05/2018   ALBUMINELP 3.4 05/05/2018   A1GS 0.3 05/05/2018   A2GS 1.1 (H) 05/05/2018   BETS 0.9 05/05/2018   BETA2SER 0.4 04/29/2018   GAMS 0.8 05/05/2018   MSPIKE Not Observed 05/05/2018   SPEI  04/29/2018     Comment:     . Evaluation is consistent with an acute inflammatory  pattern. . . A faint abnormal protein band is detected in the gamma globulins that most likely represents CRP, or circulating immune complexes, rather than a monoclonal immunoglobulin. . . Immunofixation analysis is available if identification of the band(s) is clinically indicated. .      Chemistry      Component Value Date/Time   NA 139 05/15/2019 1050   NA 140 11/25/2018 1055   K 4.3 05/15/2019 1050   CL 99 05/15/2019 1050   CO2 30 05/15/2019 1050   BUN 31 (H) 05/15/2019 1050   BUN 20 11/25/2018 1055   CREATININE 1.56 (H) 05/15/2019 1050   CREATININE 1.11 04/29/2018 1047      Component Value Date/Time   CALCIUM 9.1 05/15/2019 1050   ALKPHOS 88 05/15/2019 1050   AST 20 05/15/2019 1050   ALT 20 05/15/2019 1050   BILITOT 0.7 05/15/2019 1050      Impression and Plan: Mr. Noy is a very pleasant 78 yo caucasian gentleman with metastatic kidney cancer. He also has history of prostate cancer and melanoma in situ.   I am happy that his cancer is holding nice and steady.  He really has done well with the Inlyta and pembrolizumab.  We will go ahead with his 15th cycle of treatment today.  His last set of scans were done in early July.   I think we are good until October.  At some point, I may consider putting him on the every 6-week dose of pembrolizumab.  This could certainly help his quality of life.    [name], MD 8/24/202011:38 AM

## 2019-05-20 ENCOUNTER — Other Ambulatory Visit: Payer: Self-pay | Admitting: Hematology & Oncology

## 2019-05-30 MED FILL — INLYTA 5 MG TABLET: 5 | 28 days supply | Qty: 42 | Fill #3

## 2019-06-02 ENCOUNTER — Ambulatory Visit (INDEPENDENT_AMBULATORY_CARE_PROVIDER_SITE_OTHER): Payer: Medicare Other

## 2019-06-02 ENCOUNTER — Ambulatory Visit (INDEPENDENT_AMBULATORY_CARE_PROVIDER_SITE_OTHER): Payer: Medicare Other | Admitting: Sports Medicine

## 2019-06-02 ENCOUNTER — Other Ambulatory Visit: Payer: Self-pay

## 2019-06-02 DIAGNOSIS — J449 Chronic obstructive pulmonary disease, unspecified: Secondary | ICD-10-CM | POA: Diagnosis not present

## 2019-06-02 DIAGNOSIS — R7989 Other specified abnormal findings of blood chemistry: Secondary | ICD-10-CM | POA: Diagnosis not present

## 2019-06-02 DIAGNOSIS — R0602 Shortness of breath: Secondary | ICD-10-CM | POA: Diagnosis not present

## 2019-06-02 DIAGNOSIS — N139 Obstructive and reflux uropathy, unspecified: Secondary | ICD-10-CM | POA: Insufficient documentation

## 2019-06-02 MED ORDER — AZITHROMYCIN 250 MG PO TABS: ORAL_TABLET | ORAL | 0 refills | Status: DC

## 2019-06-02 MED ORDER — PREDNISONE 50 MG PO TABS: 50.0000 mg | ORAL_TABLET | Freq: Every day | ORAL | 0 refills | Status: DC

## 2019-06-02 MED ORDER — TAMSULOSIN HCL 0.4 MG PO CAPS: 0.4000 mg | ORAL_CAPSULE | Freq: Every day | ORAL | 3 refills | Status: DC

## 2019-06-02 NOTE — Assessment & Plan Note (Signed)
Adding Flomax, PSA, urinalysis. Discontinue Cardura

## 2019-06-02 NOTE — Assessment & Plan Note (Addendum)
Currently mild exacerbation for the past 2 weeks. Left lower lobe coarse sounds with expiratory wheezes, question infiltrate. Chest x-ray, azithromycin, prednisone. If insufficient improvement after 2 weeks we will consider switching from Anoro to Trelegy. Considering his shortness of breath at night I am going to also check a BNP. Once we get him through the exacerbation then I think it would be a good idea to continue investigation with nocturnal pulse oximetry.

## 2019-06-02 NOTE — Progress Notes (Addendum)
Subjective:    CC: Shortness of breath  HPI: [name] [name] is a pleasant 78 year old male with COPD, currently on Anoro, albuterol.  Unfortunately for the past couple of weeks he is noted increased shortness of breath in the morning, worse after laying flat at night.  No fevers, chills, minimal cough, minimally productive.  In addition he continues to have multiple episodes of voiding through the night, he feels as though he cannot empty his bladder, he has some urgency during the day as well.  I reviewed the past medical history, family history, social history, surgical history, and allergies today and no changes were needed.  Please see the problem list section below in epic for further details.  Past Medical History: Past Medical History:  Diagnosis Date  . Allergic rhinitis   . Bigeminy   . Bradycardia   . Bruit   . CAD (coronary artery disease)   . Claudication ([place])   . Diabetes mellitus type II   . Dyspnea   . Goals of care, counseling/discussion 06/02/2018  . History of prostate cancer    Dr. Gaynelle Arabian  . Hypercholesteremia    II A  . Hypertension   . Kidney cancer, primary, with metastasis from kidney to other site, right ([place]) 06/02/2018  . Malignant neoplasm of prostate ([place])   . Microalbuminuria   . Seborrheic keratosis    Past Surgical History: Past Surgical History:  Procedure Laterality Date  . CORONARY ANGIOPLASTY WITH STENT PLACEMENT    . IR FLUORO RM 30-60 MIN  05/24/2018   Social History: Social History   Socioeconomic History  . Marital status: Widowed    Spouse name: Not on file  . Number of children: Not on file  . Years of education: Not on file  . Highest education level: Not on file  Occupational History  . Occupation: Retired but still works Dentist: RETIRED  Social Needs  . Financial resource strain: Not on file  . Food insecurity    Worry: Not on file    Inability: Not on file  . Transportation needs    Medical: Not on file     Non-medical: Not on file  Tobacco Use  . Smoking status: Former Smoker    Packs/day: 1.00    Years: 20.00    Pack years: 20.00    Quit date: 04/22/2003    Years since quitting: 16.1  . Smokeless tobacco: Never Used  Substance and Sexual Activity  . Alcohol use: Not Currently    Alcohol/week: 1.0 standard drinks    Types: 1 Standard drinks or equivalent per week  . Drug use: Never  . Sexual activity: Not Currently  Lifestyle  . Physical activity    Days per week: Not on file    Minutes per session: Not on file  . Stress: Not on file  Relationships  . Social Herbalist on phone: Not on file    Gets together: Not on file    Attends religious service: Not on file    Active member of club or organization: Not on file    Attends meetings of clubs or organizations: Not on file    Relationship status: Not on file  Other Topics Concern  . Not on file  Social History Narrative   Widowed 08/2009   Has 4 kids, 2 are local   Fair diet   No regular exercise   Family History: Family History  Problem Relation Age of Onset  .  Heart attack Father        Acute MI  . Coronary artery disease Brother    Allergies: Allergies  Allergen Reactions  . Doxycycline Other (See Comments)    Flushing of hands and face.    Medications: See med rec.  Review of Systems: No fevers, chills, night sweats, weight loss, chest pain, or shortness of breath.   Objective:    General: Well Developed, well nourished, and in no acute distress.  Neuro: Alert and oriented x3, extra-ocular muscles intact, sensation grossly intact.  HEENT: Normocephalic, atraumatic, pupils equal round reactive to light, neck supple, no masses, no lymphadenopathy, thyroid nonpalpable.  Skin: Warm and dry, no rashes. Cardiac: Regular rate and rhythm, no murmurs rubs or gallops, no lower extremity edema.  Respiratory: Coarse sounds in the left lower lobe with expiratory wheeze. Not using accessory muscles, speaking  in full sentences.  Chest x-ray personally reviewed, spotty infiltrate in the left lower lobe  Impression and Recommendations:    COPD, severe Currently mild exacerbation for the past 2 weeks. Left lower lobe coarse sounds with expiratory wheezes, question infiltrate. Chest x-ray, azithromycin, prednisone. If insufficient improvement after 2 weeks we will consider switching from Anoro to Trelegy. Considering his shortness of breath at night I am going to also check a BNP. Once we get him through the exacerbation then I think it would be a good idea to continue investigation with nocturnal pulse oximetry.  Obstructive uropathy Adding Flomax, PSA, urinalysis. Discontinue Cardura  Elevated brain natriuretic peptide (BNP) level BNP is elevated, though this may represent cor pulmonale from his COPD we certainly need to evaluate for congestive heart failure considering his orthopnea, adding an echo.   ___________________________________________ Gwen Her. Dianah Field, M.D., ABFM., CAQSM. Primary Care and Sports Medicine [facility]  Adjunct Professor of [employer] [facility] [employer]

## 2019-06-04 DIAGNOSIS — R7989 Other specified abnormal findings of blood chemistry: Secondary | ICD-10-CM | POA: Insufficient documentation

## 2019-06-04 NOTE — Addendum Note (Signed)
Addended by: Silverio Decamp on: 06/04/2019 12:54 PM   Modules accepted: Orders

## 2019-06-04 NOTE — Assessment & Plan Note (Signed)
BNP is elevated, though this may represent cor pulmonale from his COPD we certainly need to evaluate for congestive heart failure considering his orthopnea, adding an echo.

## 2019-06-05 ENCOUNTER — Other Ambulatory Visit: Payer: Self-pay

## 2019-06-05 ENCOUNTER — Encounter: Payer: Self-pay | Admitting: Hematology & Oncology

## 2019-06-05 ENCOUNTER — Inpatient Hospital Stay: Payer: Medicare Other

## 2019-06-05 ENCOUNTER — Inpatient Hospital Stay: Payer: Medicare Other | Attending: Hematology & Oncology | Admitting: Hematology & Oncology

## 2019-06-05 VITALS — BP 182/69 | HR 56 | Temp 97.7°F | Resp 18 | Ht 73.0 in | Wt 157.0 lb

## 2019-06-05 DIAGNOSIS — C641 Malignant neoplasm of right kidney, except renal pelvis: Secondary | ICD-10-CM | POA: Diagnosis not present

## 2019-06-05 DIAGNOSIS — E032 Hypothyroidism due to medicaments and other exogenous substances: Secondary | ICD-10-CM

## 2019-06-05 DIAGNOSIS — Z5112 Encounter for antineoplastic immunotherapy: Secondary | ICD-10-CM | POA: Insufficient documentation

## 2019-06-05 DIAGNOSIS — C7951 Secondary malignant neoplasm of bone: Secondary | ICD-10-CM | POA: Insufficient documentation

## 2019-06-05 LAB — CBC WITH DIFFERENTIAL (CANCER CENTER ONLY)
Abs Immature Granulocytes: 0.08 10*3/uL — ABNORMAL HIGH (ref 0.00–0.07)
Basophils Absolute: 0 10*3/uL (ref 0.0–0.1)
Basophils Relative: 0 %
Eosinophils Absolute: 0 10*3/uL (ref 0.0–0.5)
Eosinophils Relative: 0 %
HCT: 40.4 % (ref 39.0–52.0)
Hemoglobin: 12.9 g/dL — ABNORMAL LOW (ref 13.0–17.0)
Immature Granulocytes: 1 %
Lymphocytes Relative: 14 %
Lymphs Abs: 1.3 10*3/uL (ref 0.7–4.0)
MCH: 29.9 pg (ref 26.0–34.0)
MCHC: 31.9 g/dL (ref 30.0–36.0)
MCV: 93.5 fL (ref 80.0–100.0)
Monocytes Absolute: 0.5 10*3/uL (ref 0.1–1.0)
Monocytes Relative: 5 %
Neutro Abs: 7.5 10*3/uL (ref 1.7–7.7)
Neutrophils Relative %: 80 %
Platelet Count: 208 10*3/uL (ref 150–400)
RBC: 4.32 MIL/uL (ref 4.22–5.81)
RDW: 14.5 % (ref 11.5–15.5)
WBC Count: 9.4 10*3/uL (ref 4.0–10.5)
nRBC: 0 % (ref 0.0–0.2)

## 2019-06-05 LAB — CMP (CANCER CENTER ONLY)
ALT: 23 U/L (ref 0–44)
AST: 21 U/L (ref 15–41)
Albumin: 3.8 g/dL (ref 3.5–5.0)
Alkaline Phosphatase: 79 U/L (ref 38–126)
Anion gap: 11 (ref 5–15)
BUN: 33 mg/dL — ABNORMAL HIGH (ref 8–23)
CO2: 28 mmol/L (ref 22–32)
Calcium: 9.9 mg/dL (ref 8.9–10.3)
Chloride: 101 mmol/L (ref 98–111)
Creatinine: 1.23 mg/dL (ref 0.61–1.24)
GFR, Est AFR Am: >60 mL/min (ref >60)
GFR, Estimated: 56 mL/min — ABNORMAL LOW (ref >60)
Glucose, Bld: 154 mg/dL — ABNORMAL HIGH (ref 70–99)
Potassium: 3.7 mmol/L (ref 3.5–5.1)
Sodium: 140 mmol/L (ref 135–145)
Total Bilirubin: 0.5 mg/dL (ref 0.3–1.2)
Total Protein: 6.4 g/dL — ABNORMAL LOW (ref 6.5–8.1)

## 2019-06-05 LAB — COMPLETE METABOLIC PANEL WITH GFR
AG Ratio: 1.5 (calc) (ref 1.0–2.5)
ALT: 13 U/L (ref 9–46)
AST: 15 U/L (ref 10–35)
Albumin: 3.5 g/dL — ABNORMAL LOW (ref 3.6–5.1)
Alkaline phosphatase (APISO): 81 U/L (ref 35–144)
BUN/Creatinine Ratio: 29 (calc) — ABNORMAL HIGH (ref 6–22)
BUN: 31 mg/dL — ABNORMAL HIGH (ref 7–25)
CO2: 27 mmol/L (ref 20–32)
Calcium: 9.9 mg/dL (ref 8.6–10.3)
Chloride: 102 mmol/L (ref 98–110)
Creat: 1.07 mg/dL (ref 0.70–1.18)
GFR, Est African American: 77 mL/min/{1.73_m2} (ref >=60)
GFR, Est Non African American: 67 mL/min/{1.73_m2} (ref >=60)
Globulin: 2.4 g/dL (calc) (ref 1.9–3.7)
Glucose, Bld: 138 mg/dL — ABNORMAL HIGH (ref 65–99)
Potassium: 3.9 mmol/L (ref 3.5–5.3)
Sodium: 139 mmol/L (ref 135–146)
Total Bilirubin: 0.6 mg/dL (ref 0.2–1.2)
Total Protein: 5.9 g/dL — ABNORMAL LOW (ref 6.1–8.1)

## 2019-06-05 LAB — URINALYSIS W MICROSCOPIC + REFLEX CULTURE
Bacteria, UA: NONE SEEN /HPF
Bilirubin Urine: NEGATIVE
Glucose, UA: NEGATIVE
Hgb urine dipstick: NEGATIVE
Hyaline Cast: NONE SEEN /LPF
Ketones, ur: NEGATIVE
Leukocyte Esterase: NEGATIVE
Nitrites, Initial: NEGATIVE
RBC / HPF: NONE SEEN /HPF (ref 0–2)
Specific Gravity, Urine: 1.019 (ref 1.001–1.03)
Squamous Epithelial / HPF: NONE SEEN /HPF (ref <=5)
WBC, UA: NONE SEEN /HPF (ref 0–5)
pH: <=5 (ref 5.0–8.0)

## 2019-06-05 LAB — CBC
HCT: 39.1 % (ref 38.5–50.0)
Hemoglobin: 12.7 g/dL — ABNORMAL LOW (ref 13.2–17.1)
MCH: 29.7 pg (ref 27.0–33.0)
MCHC: 32.5 g/dL (ref 32.0–36.0)
MCV: 91.6 fL (ref 80.0–100.0)
MPV: 10 fL (ref 7.5–12.5)
Platelets: 165 10*3/uL (ref 140–400)
RBC: 4.27 10*6/uL (ref 4.20–5.80)
RDW: 14.5 % (ref 11.0–15.0)
WBC: 7.1 10*3/uL (ref 3.8–10.8)

## 2019-06-05 LAB — PSA, TOTAL AND FREE
PSA, % Free: 7 % (calc) — ABNORMAL LOW (ref >25)
PSA, Free: 0.1 ng/mL
PSA, Total: 1.4 ng/mL (ref <=4.0)

## 2019-06-05 LAB — NO CULTURE INDICATED

## 2019-06-05 LAB — BRAIN NATRIURETIC PEPTIDE: Brain Natriuretic Peptide: 216 pg/mL — ABNORMAL HIGH (ref <100)

## 2019-06-05 MED ORDER — SODIUM CHLORIDE 0.9 % IV SOLN
Freq: Once | INTRAVENOUS | Status: AC
  Administered 2019-06-05: 13:00:00 via INTRAVENOUS
  Filled 2019-06-05: qty 250

## 2019-06-05 MED ORDER — SODIUM CHLORIDE 0.9 % IV SOLN
200.0000 mg | Freq: Once | INTRAVENOUS | Status: AC
  Administered 2019-06-05: 200 mg via INTRAVENOUS
  Filled 2019-06-05: qty 8

## 2019-06-05 NOTE — Progress Notes (Signed)
Hematology and Oncology Follow Up Visit  [name] [name] [id number] 1940/11/27 78 y.o. 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  He is doing pretty well.  He is taking this week off.  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  He travels all over the [place].  He has not complained much in the way of pain.  He is on oxycodone which does seem to help.  He has had no problems with nausea or vomiting.  He has had a little bit of a cough.  He saw his family doctor and it sounds like he was put on some steroids.  This made him feel a lot better.  He has had no bleeding.  He has had a little bit of leg swelling.   He is doing okay with his blood pressure.  He gave me a list of his blood pressure recordings since we last saw him.  They actually were doing quite well.  He is managing the Inlyta without any difficulties.  Is not having any diarrhea.  There is no headache.  He has had no rashes.  He has had no nausea or vomiting.  Overall, I would have to say his performance status is ECOG 1.  Medications:  Allergies as of 06/05/2019      Reactions   Doxycycline Other (See Comments)   Flushing of hands and face.       Medication List       Accurate as of June 05, 2019 12:47 PM. If you have any questions, ask your nurse or doctor.        AMBULATORY NON FORMULARY MEDICATION Medication Name: Overnight pulse ox. Please fax to [place]. Dx of COPD and SOB at night.   amLODipine 10 MG tablet Commonly known as: NORVASC TAKE 1 TABLET BY MOUTH DAILY   Anoro Ellipta 62.5-25 MCG/INH Aepb Generic drug: umeclidinium-vilanterol INHALE 1 PUFF BY MOUTH ONCE  DAILY.   azithromycin 250 MG tablet Commonly known as: Zithromax Z-Pak Take 2 tablets (500 mg) on  Day 1,  followed by 1 tablet (250 mg) once daily on Days 2 through 5.   carvedilol 25 MG tablet Commonly known as: COREG Take 1 tablet (25 mg total) by mouth 2 (two) times daily.   clopidogrel 75 MG tablet Commonly known as: PLAVIX TAKE 1 TABLET BY MOUTH EVERY DAY   CONTOUR BLOOD GLUCOSE SYSTEM Devi Diagnoses - Diabetes E 11.8 Check blood sugar once daily   gabapentin 300 MG capsule Commonly known as: NEURONTIN One tab PO qHS for a week, then BID for a week, then TID. May double weekly to a max of 3,628m/day. MUST SCHEDULE APPT   glucose blood test strip Commonly known as: BEstate manager/land agentDiagnoses - Diabetes E 11.8 Check blood sugar once daily   hydrALAZINE 25 MG tablet Commonly known as: APRESOLINE TAKE 1 TABLET(25 MG) BY MOUTH THREE TIMES DAILY   Inlyta 5 MG tablet Generic drug: axitinib TAKE 1 TABLET (5MG) BY MOUTH TWICE DAILY FOR 21 DAYS ON, THEN 7 DAYS OFF.   levothyroxine 75 MCG tablet Commonly known as: SYNTHROID TAKE 1 TABLET(75 MCG) BY MOUTH  DAILY BEFORE BREAKFAST   losartan-hydrochlorothiazide 50-12.5 MG tablet Commonly known as: HYZAAR Take 1 tablet by mouth 2 (two) times a day.   metFORMIN 500 MG 24 hr tablet Commonly known as: GLUCOPHAGE-XR TAKE 2 TABLETS BY MOUTH EVERY DAY WITH BREAKFAST   multivitamin with minerals tablet Take 1 tablet by mouth daily.   Oxycodone HCl 10 MG Tabs Take 1 tablet (10 mg total) by mouth every 6 (six) hours as needed.   predniSONE 50 MG tablet Commonly known as: DELTASONE Take 1 tablet (50 mg total) by mouth daily.   ProAir HFA 108 (90 Base) MCG/ACT inhaler Generic drug: albuterol INHALE 2 PUFFS BY MOUTH INTO THE LUNGS EVERY 6 HOURS AS DIRECTED AS NEEDED FOR WHEEZING   prochlorperazine 10 MG tablet Commonly known as: COMPAZINE TAKE 1 TABLET(10 MG) BY MOUTH EVERY 6 HOURS AS NEEDED FOR NAUSEA OR VOMITING    tamsulosin 0.4 MG Caps capsule Commonly known as: Flomax Take 1 capsule (0.4 mg total) by mouth daily after breakfast.   Xtampza ER 13.5 MG C12a Generic drug: oxyCODONE ER Take 1 capsule by mouth every 12 (twelve) hours.       Allergies:  Allergies  Allergen Reactions  . Doxycycline Other (See Comments)    Flushing of hands and face.     Past Medical History, Surgical history, Social history, and Family History were reviewed and updated.  Review of Systems: Review of Systems  Constitutional: Negative.   HENT: Negative.   Eyes: Negative.   Respiratory: Negative.   Cardiovascular: Negative.   Gastrointestinal: Negative.   Genitourinary: Negative.   Musculoskeletal: Negative.   Skin: Negative.   Neurological: Negative.   Endo/Heme/Allergies: Negative.   Psychiatric/Behavioral: Negative.      Physical Exam:  height is '6\' 1"'  (1.854 m) and weight is 157 lb (71.2 kg). His temporal temperature is 97.7 F (36.5 C). His blood pressure is 182/69 (abnormal) and his pulse is 56 (abnormal). His respiration is 18 and oxygen saturation is 100%.   Wt Readings from Last 3 Encounters:  06/05/19 157 lb (71.2 kg)  06/02/19 159 lb (72.1 kg)  05/15/19 155 lb (70.3 kg)    Physical Exam Vitals signs reviewed.  HENT:     Head: Normocephalic and atraumatic.  Eyes:     Pupils: Pupils are equal, round, and reactive to light.  Neck:     Musculoskeletal: Normal range of motion.  Cardiovascular:     Rate and Rhythm: Normal rate and regular rhythm.     Heart sounds: Normal heart sounds.  Pulmonary:     Effort: Pulmonary effort is normal.     Breath sounds: Normal breath sounds.  Abdominal:     General: Bowel sounds are normal.     Palpations: Abdomen is soft.     Comments: Abdominal exam shows the tic wound in the left lower quadrant.  Is erythematous.  Is slightly firm.  It probably measures about 1 cm x 1.5 cm.  There is no hepatomegaly.  There is no splenomegaly.  Musculoskeletal:  Normal range of motion.        General: No tenderness or deformity.  Lymphadenopathy:     Cervical: No cervical adenopathy.  Skin:    General: Skin is warm and dry.     Findings: No erythema or rash.  Neurological:     Mental Status: He is alert and oriented to person, place, and time.  Psychiatric:        Behavior: Behavior normal.  Thought Content: Thought content normal.        Judgment: Judgment normal.       Lab Results  Component Value Date   WBC 9.4 06/05/2019   HGB 12.9 (L) 06/05/2019   HCT 40.4 06/05/2019   MCV 93.5 06/05/2019   PLT 208 06/05/2019   Lab Results  Component Value Date   FERRITIN 95 10/01/2017   Lab Results  Component Value Date   RBC 4.32 06/05/2019   Lab Results  Component Value Date   KPAFRELGTCHN 28.2 (H) 05/05/2018   LAMBDASER 24.6 05/05/2018   KAPLAMBRATIO 5.75 05/09/2018   Lab Results  Component Value Date   IGGSERUM 799 05/05/2018   IGA 229 05/05/2018   IGMSERUM 136 05/05/2018   Lab Results  Component Value Date   TOTALPROTELP 6.6 05/05/2018   ALBUMINELP 3.4 05/05/2018   A1GS 0.3 05/05/2018   A2GS 1.1 (H) 05/05/2018   BETS 0.9 05/05/2018   BETA2SER 0.4 04/29/2018   GAMS 0.8 05/05/2018   MSPIKE Not Observed 05/05/2018   SPEI  04/29/2018     Comment:     . Evaluation is consistent with an acute inflammatory  pattern. . . A faint abnormal protein band is detected in the gamma globulins that most likely represents CRP, or circulating immune complexes, rather than a monoclonal immunoglobulin. . . Immunofixation analysis is available if identification of the band(s) is clinically indicated. .      Chemistry      Component Value Date/Time   NA 140 06/05/2019 1108   NA 140 11/25/2018 1055   K 3.7 06/05/2019 1108   CL 101 06/05/2019 1108   CO2 28 06/05/2019 1108   BUN 33 (H) 06/05/2019 1108   BUN 20 11/25/2018 1055   CREATININE 1.23 06/05/2019 1108   CREATININE 1.07 06/02/2019 0901      Component Value  Date/Time   CALCIUM 9.9 06/05/2019 1108   ALKPHOS 79 06/05/2019 1108   AST 21 06/05/2019 1108   ALT 23 06/05/2019 1108   BILITOT 0.5 06/05/2019 1108      Impression and Plan: [name] [name] is a very pleasant 78 yo caucasian gentleman with metastatic kidney cancer. He also has history of prostate cancer and melanoma in situ.   I am happy that his cancer is holding nice and steady.  He really has done well with the Inlyta and pembrolizumab.  We will go ahead with his 16th cycle of treatment today.  His last set of scans were done in early July.  I think we are good until October.  At some point, I may consider putting him on the every 6-week dose of pembrolizumab.  This could certainly help his quality of life.    [name], MD 9/14/202012:47 PM

## 2019-06-06 ENCOUNTER — Ambulatory Visit: Payer: Medicare Other | Admitting: Family Medicine

## 2019-06-06 LAB — LACTATE DEHYDROGENASE: LDH: 195 U/L — ABNORMAL HIGH (ref 98–192)

## 2019-06-06 NOTE — Progress Notes (Signed)
Patient ID: [name] [name], male   DOB: 08-31-1941, 78 y.o.   MRN: [id number]      [name] [name] is seen today in F/U for CAD, HTN and elevated chol. 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 He is not having any SSCP SOB or edema. He has been compliant with his meds. His LDL has been under 100 with normal LFT's He has moderate carotid disease   Has left bruit Duplex 10/2018 plaque no stenosis     Has chronic venous insuf with occasional skin breakdown at ankles worse on left   Seen at [facility] 11/19/16 with SVT  K low labs otherwise ok TSH 3.41  Cardioverted to NSR  6 mg adenosine put on Toprol Seen again for SVT [facility] 09/21/17 ECG with SVT RBBB rate 172  [facility] beta blocker changed to [place] ablation but declined at this time  No cardiac issues   ROS: Denies fever, malais, weight loss, blurry vision, decreased visual acuity, cough, sputum, SOB, hemoptysis, pleuritic pain, palpitaitons, heartburn, abdominal pain, melena, lower extremity edema, claudication, or rash.  All other systems reviewed and negative  General: BP (!) 170/62   Pulse 60   Ht 6\' 1"  (1.854 m)   Wt 154 lb (69.9 kg)   SpO2 99%   BMI 20.32 kg/m  Affect appropriate Healthy:  appears stated age 78: normal Neck supple with no adenopathy JVP normal left  bruits no thyromegaly Lungs clear with no wheezing and good diaphragmatic motion Heart:  S1/S2 no murmur, no rub, gallop or click PMI normal Abdomen: benighn, BS positve, no tenderness, no AAA no bruit.  No HSM or HJR Distal pulses intact with no bruits LE edema with mild erythema inside left leg  Neuro non-focal Skin warm and dry No muscular weakness     Current Outpatient Medications  Medication Sig Dispense Refill  . AMBULATORY NON FORMULARY MEDICATION Medication Name: Overnight  pulse ox. Please fax to [place]. Dx of COPD and SOB at night. 1 vial 0  . amLODipine (NORVASC) 10 MG tablet TAKE 1 TABLET BY MOUTH DAILY 90 tablet 1  . Blood Glucose Monitoring Suppl (CONTOUR BLOOD GLUCOSE SYSTEM) DEVI Diagnoses - Diabetes E 11.8 Check blood sugar once daily 1 Device 0  . carvedilol (COREG) 25 MG tablet Take 1 tablet (25 mg total) by mouth 2 (two) times daily. 180 tablet 2  . clopidogrel (PLAVIX) 75 MG tablet TAKE 1 TABLET BY MOUTH EVERY DAY 90 tablet 3  . Fluticasone-Umeclidin-Vilant (TRELEGY ELLIPTA) 100-62.5-25 MCG/INH AEPB Inhale 1 puff into the lungs daily. 1 each 11  . gabapentin (NEURONTIN) 300 MG capsule One tab PO qHS for a week, then BID for a week, then TID. May double weekly to a max of 3,600mg /day. MUST SCHEDULE APPT 90 capsule 0  . glucose blood (BAYER CONTOUR TEST) test strip Diagnoses - Diabetes E 11.8 Check blood sugar once daily 100 each prn  . hydrALAZINE (APRESOLINE) 25 MG tablet TAKE 1 TABLET(25 MG) BY MOUTH THREE TIMES DAILY 180 tablet 4  . INLYTA 5 MG tablet TAKE 1 TABLET (5MG ) BY MOUTH TWICE DAILY FOR 21 DAYS ON, THEN 7 DAYS OFF. 42 tablet 3  . levothyroxine (SYNTHROID) 75 MCG tablet TAKE 1 TABLET(75 MCG) BY MOUTH DAILY BEFORE BREAKFAST 60 tablet 2  . losartan-hydrochlorothiazide (HYZAAR) 50-12.5 MG tablet  Take 1 tablet by mouth 2 (two) times a day. 60 tablet 3  . metFORMIN (GLUCOPHAGE-XR) 500 MG 24 hr tablet TAKE 2 TABLETS BY MOUTH EVERY DAY WITH BREAKFAST 180 tablet 1  . Multiple Vitamins-Minerals (MULTIVITAMIN WITH MINERALS) tablet Take 1 tablet by mouth daily.    . Oxycodone HCl 10 MG TABS Take 1 tablet (10 mg total) by mouth every 6 (six) hours as needed. 90 tablet 0  . PROAIR HFA 108 (90 Base) MCG/ACT inhaler INHALE 2 PUFFS BY MOUTH INTO THE LUNGS EVERY 6 HOURS AS DIRECTED AS NEEDED FOR WHEEZING 8.5 g 2  . prochlorperazine (COMPAZINE) 10 MG tablet TAKE 1 TABLET(10 MG) BY MOUTH EVERY 6 HOURS AS NEEDED FOR NAUSEA OR VOMITING 385 tablet 1  . tamsulosin  (FLOMAX) 0.4 MG CAPS capsule Take 1 capsule (0.4 mg total) by mouth 2 (two) times daily. 60 capsule 3  . XTAMPZA ER 13.5 MG C12A Take 1 capsule by mouth every 12 (twelve) hours. 60 capsule 0   No current facility-administered medications for this visit.     Allergies  Doxycycline  Electrocardiogram:    07/23/17 SB rate 53 PR 226 PVC RBBB  Assessment and Plan  CAD: distant stent OM 96 continue medical Rx  HTN:  Well controlled.  Continue current medications and low sodium Dash type diet.    Bradycardia:  Had lowered beta blocker and now on coreg stable   Chol:  Labs with primary continue statin .  Bruit:  Plaque no stenosis by duplex 11/14/18   No TIA symptoms.  Continue antiplatelet Rx and F/U carotid 10/2020  Edema:  Dependant from chronic venous disease improved with HCTZ refer to [facility] To see if he has reflux that would be amenable to ablative RX   SVT: on coreg seen by [facility] offered ablation declined at this time f/u EP    [employer]

## 2019-06-09 ENCOUNTER — Ambulatory Visit (HOSPITAL_BASED_OUTPATIENT_CLINIC_OR_DEPARTMENT_OTHER)
Admission: RE | Admit: 2019-06-09 | Discharge: 2019-06-09 | Disposition: A | Discharge status: Home / Self Care | Payer: Medicare Other | Source: Ambulatory Visit | Attending: Sports Medicine | Admitting: Sports Medicine

## 2019-06-09 ENCOUNTER — Other Ambulatory Visit: Payer: Self-pay

## 2019-06-09 DIAGNOSIS — R7989 Other specified abnormal findings of blood chemistry: Secondary | ICD-10-CM

## 2019-06-09 DIAGNOSIS — E119 Type 2 diabetes mellitus without complications: Secondary | ICD-10-CM | POA: Insufficient documentation

## 2019-06-09 DIAGNOSIS — I10 Essential (primary) hypertension: Secondary | ICD-10-CM | POA: Insufficient documentation

## 2019-06-09 DIAGNOSIS — E785 Hyperlipidemia, unspecified: Secondary | ICD-10-CM | POA: Insufficient documentation

## 2019-06-09 DIAGNOSIS — I083 Combined rheumatic disorders of mitral, aortic and tricuspid valves: Secondary | ICD-10-CM | POA: Insufficient documentation

## 2019-06-09 DIAGNOSIS — R0600 Dyspnea, unspecified: Secondary | ICD-10-CM

## 2019-06-09 NOTE — Progress Notes (Signed)
  Echocardiogram 2D Echocardiogram has been performed.  [name] [name] 06/09/2019, 10:51 AM

## 2019-06-16 ENCOUNTER — Encounter: Payer: Self-pay | Admitting: Sports Medicine

## 2019-06-16 ENCOUNTER — Ambulatory Visit (INDEPENDENT_AMBULATORY_CARE_PROVIDER_SITE_OTHER): Payer: Medicare Other | Admitting: Sports Medicine

## 2019-06-16 ENCOUNTER — Other Ambulatory Visit: Payer: Self-pay

## 2019-06-16 DIAGNOSIS — M19042 Primary osteoarthritis, left hand: Secondary | ICD-10-CM | POA: Diagnosis not present

## 2019-06-16 DIAGNOSIS — J449 Chronic obstructive pulmonary disease, unspecified: Secondary | ICD-10-CM

## 2019-06-16 DIAGNOSIS — N139 Obstructive and reflux uropathy, unspecified: Secondary | ICD-10-CM

## 2019-06-16 MED ORDER — TRELEGY ELLIPTA 100-62.5-25 MCG/INH IN AEPB: 1.0000 | INHALATION_SPRAY | Freq: Every day | RESPIRATORY_TRACT | 11 refills | Status: AC

## 2019-06-16 MED ORDER — TAMSULOSIN HCL 0.4 MG PO CAPS: 0.4000 mg | ORAL_CAPSULE | Freq: Two times a day (BID) | ORAL | 3 refills | Status: DC

## 2019-06-16 NOTE — Assessment & Plan Note (Signed)
Increasing Flomax to 0.8 mg. If insufficient improvement we will consider treating more aggressively for overactive bladder.

## 2019-06-16 NOTE — Assessment & Plan Note (Signed)
Arthritis of the left third MCP, he is unable to flex the joint far enough to hit all the strings on his guitar. Adding some hand physical therapy. If no better in a month we will do an MCP injection.

## 2019-06-16 NOTE — Progress Notes (Signed)
Subjective:    CC: Follow-up multiple issues  HPI: COPD: Did well when on prednisone, he is currently using Anoro.  Luckily his echocardiogram was normal.  Obstructive uropathy: Did not have much improvement with Flomax 0.4 mg.  Hand osteoarthritis: Left hand, third MCP, tenderness and loss of range of motion.  I reviewed the past medical history, family history, social history, surgical history, and allergies today and no changes were needed.  Please see the problem list section below in epic for further details.  Past Medical History: Past Medical History:  Diagnosis Date  . Allergic rhinitis   . Bigeminy   . Bradycardia   . Bruit   . CAD (coronary artery disease)   . Claudication ([place])   . Diabetes mellitus type II   . Dyspnea   . Goals of care, counseling/discussion 06/02/2018  . History of prostate cancer    Dr. Gaynelle Arabian  . Hypercholesteremia    II A  . Hypertension   . Kidney cancer, primary, with metastasis from kidney to other site, right ([place]) 06/02/2018  . Malignant neoplasm of prostate ([place])   . Microalbuminuria   . Seborrheic keratosis    Past Surgical History: Past Surgical History:  Procedure Laterality Date  . CORONARY ANGIOPLASTY WITH STENT PLACEMENT    . IR FLUORO RM 30-60 MIN  05/24/2018   Social History: Social History   Socioeconomic History  . Marital status: Widowed    Spouse name: Not on file  . Number of children: Not on file  . Years of education: Not on file  . Highest education level: Not on file  Occupational History  . Occupation: Retired but still works Dentist: RETIRED  Social Needs  . Financial resource strain: Not on file  . Food insecurity    Worry: Not on file    Inability: Not on file  . Transportation needs    Medical: Not on file    Non-medical: Not on file  Tobacco Use  . Smoking status: Former Smoker    Packs/day: 1.00    Years: 20.00    Pack years: 20.00    Quit date: 04/22/2003    Years since  quitting: 16.1  . Smokeless tobacco: Never Used  Substance and Sexual Activity  . Alcohol use: Not Currently    Alcohol/week: 1.0 standard drinks    Types: 1 Standard drinks or equivalent per week  . Drug use: Never  . Sexual activity: Not Currently  Lifestyle  . Physical activity    Days per week: Not on file    Minutes per session: Not on file  . Stress: Not on file  Relationships  . Social Herbalist on phone: Not on file    Gets together: Not on file    Attends religious service: Not on file    Active member of club or organization: Not on file    Attends meetings of clubs or organizations: Not on file    Relationship status: Not on file  Other Topics Concern  . Not on file  Social History Narrative   Widowed 08/2009   Has 4 kids, 2 are local   Fair diet   No regular exercise   Family History: Family History  Problem Relation Age of Onset  . Heart attack Father        Acute MI  . Coronary artery disease Brother    Allergies: Allergies  Allergen Reactions  . Doxycycline Other (See Comments)  Flushing of hands and face.    Medications: See med rec.  Review of Systems: No fevers, chills, night sweats, weight loss, chest pain, or shortness of breath.   Objective:    General: Well Developed, well nourished, and in no acute distress.  Neuro: Alert and oriented x3, extra-ocular muscles intact, sensation grossly intact.  HEENT: Normocephalic, atraumatic, pupils equal round reactive to light, neck supple, no masses, no lymphadenopathy, thyroid nonpalpable.  Skin: Warm and dry, no rashes. Cardiac: Regular rate and rhythm, no murmurs rubs or gallops, no lower extremity edema.  Respiratory: Clear to auscultation bilaterally. Not using accessory muscles, speaking in full sentences. Left hand: Tender, minimally swollen third MCP.  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 Adding some hand physical therapy. If no better in a month we will do an MCP injection.  COPD, severe Felt really good on the prednisone, currently on Anoro. I do think we are going to go ahead and switch to Trelegy, he seemed to respond really well to the corticosteroid. Return to see PCP.  Obstructive uropathy Increasing Flomax to 0.8 mg. If insufficient improvement we will consider treating more aggressively for overactive bladder.   ___________________________________________ Gwen Her. Dianah Field, M.D., ABFM., CAQSM. Primary Care and Sports Medicine [facility]  Adjunct Professor of [place] [employer] [facility] [employer] Medicine

## 2019-06-16 NOTE — Assessment & Plan Note (Signed)
Felt really good on the prednisone, currently on Anoro. I do think we are going to go ahead and switch to Trelegy, he seemed to respond really well to the corticosteroid. Return to see PCP.

## 2019-06-19 ENCOUNTER — Ambulatory Visit (INDEPENDENT_AMBULATORY_CARE_PROVIDER_SITE_OTHER): Payer: Medicare Other | Admitting: Cardiovascular Disease

## 2019-06-19 ENCOUNTER — Encounter: Payer: Self-pay | Admitting: Cardiovascular Disease

## 2019-06-19 ENCOUNTER — Other Ambulatory Visit: Payer: Self-pay

## 2019-06-19 VITALS — BP 170/62 | HR 60 | Ht 73.0 in | Wt 154.0 lb

## 2019-06-19 DIAGNOSIS — I471 Supraventricular tachycardia, unspecified: Secondary | ICD-10-CM

## 2019-06-19 NOTE — Patient Instructions (Addendum)
Medication Instructions:   If you need a refill on your cardiac medications before your next appointment, please call your pharmacy.   Lab work:  If you have labs (blood work) drawn today and your tests are completely normal, you will receive your results only by: Marland Kitchen MyChart Message (if you have MyChart) OR . A paper copy in the mail If you have any lab test that is abnormal or we need to change your treatment, we will call you to review the results.  Testing/Procedures:   Follow-Up: At [facility], you and your health needs are our priority.  As part of our continuing mission to provide you with exceptional heart care, we have created designated Provider Care Teams.  These Care Teams include your primary Cardiologist (physician) and Advanced Practice Providers (APPs -  Physician Assistants and Nurse Practitioners) who all work together to provide you with the care you need, when you need it. You will need a follow up appointment in 12 months.  Please call our office 2 months in advance to schedule this appointment.  You may see Dr. Johnsie Cancel or one of the following Advanced Practice Providers on your designated Care Team:   Truitt Merle, NP Cecilie Kicks, NP . Kathyrn Drown, NP

## 2019-06-20 ENCOUNTER — Telehealth: Payer: Self-pay

## 2019-06-20 DIAGNOSIS — I252 Old myocardial infarction: Secondary | ICD-10-CM | POA: Diagnosis not present

## 2019-06-20 DIAGNOSIS — K828 Other specified diseases of gallbladder: Secondary | ICD-10-CM | POA: Diagnosis not present

## 2019-06-20 DIAGNOSIS — K8071 Calculus of gallbladder and bile duct without cholecystitis with obstruction: Secondary | ICD-10-CM | POA: Diagnosis not present

## 2019-06-20 DIAGNOSIS — J449 Chronic obstructive pulmonary disease, unspecified: Secondary | ICD-10-CM | POA: Diagnosis not present

## 2019-06-20 DIAGNOSIS — N2889 Other specified disorders of kidney and ureter: Secondary | ICD-10-CM | POA: Diagnosis not present

## 2019-06-20 DIAGNOSIS — I4510 Unspecified right bundle-branch block: Secondary | ICD-10-CM | POA: Diagnosis not present

## 2019-06-20 DIAGNOSIS — E119 Type 2 diabetes mellitus without complications: Secondary | ICD-10-CM | POA: Diagnosis not present

## 2019-06-20 DIAGNOSIS — Z7982 Long term (current) use of aspirin: Secondary | ICD-10-CM | POA: Diagnosis not present

## 2019-06-20 DIAGNOSIS — Z79899 Other long term (current) drug therapy: Secondary | ICD-10-CM | POA: Diagnosis not present

## 2019-06-20 DIAGNOSIS — Z85038 Personal history of other malignant neoplasm of large intestine: Secondary | ICD-10-CM | POA: Diagnosis not present

## 2019-06-20 DIAGNOSIS — K8020 Calculus of gallbladder without cholecystitis without obstruction: Secondary | ICD-10-CM | POA: Diagnosis not present

## 2019-06-20 DIAGNOSIS — Z7984 Long term (current) use of oral hypoglycemic drugs: Secondary | ICD-10-CM | POA: Diagnosis not present

## 2019-06-20 DIAGNOSIS — K449 Diaphragmatic hernia without obstruction or gangrene: Secondary | ICD-10-CM | POA: Diagnosis not present

## 2019-06-20 DIAGNOSIS — Z87891 Personal history of nicotine dependence: Secondary | ICD-10-CM | POA: Diagnosis not present

## 2019-06-20 DIAGNOSIS — I10 Essential (primary) hypertension: Secondary | ICD-10-CM | POA: Diagnosis not present

## 2019-06-20 DIAGNOSIS — Z7902 Long term (current) use of antithrombotics/antiplatelets: Secondary | ICD-10-CM | POA: Diagnosis not present

## 2019-06-20 DIAGNOSIS — K219 Gastro-esophageal reflux disease without esophagitis: Secondary | ICD-10-CM | POA: Diagnosis not present

## 2019-06-20 NOTE — Telephone Encounter (Signed)
Emontae has been referred to a Psychologist, sport and exercise in [place] to have his gall bladder removed. He wanted to know if Dr Madilyn Fireman has a different recommendation for a surgeon.   Dr. Alfredo Bach. Arvin Collard, MD Address: [address], [place], [place] [address] Phone: [phone]

## 2019-06-21 ENCOUNTER — Other Ambulatory Visit: Payer: Self-pay | Admitting: Hematology & Oncology

## 2019-06-21 DIAGNOSIS — K8020 Calculus of gallbladder without cholecystitis without obstruction: Secondary | ICD-10-CM | POA: Diagnosis not present

## 2019-06-21 NOTE — Telephone Encounter (Signed)
Patient advised.

## 2019-06-21 NOTE — Telephone Encounter (Signed)
I think Dr. Arvin Collard is a good Psychologist, sport and exercise and will do a good job for you.  If you need any assistance from [place] please let us know.  Or if you want to be referred to [facility] I can set you up at [facility] surgery.  There are several good surgeons there as well.

## 2019-06-23 ENCOUNTER — Telehealth: Payer: Self-pay | Admitting: Cardiovascular Disease

## 2019-06-23 NOTE — Telephone Encounter (Signed)
° °  [facility] Pre-operative Risk Assessment    Request for surgical clearance:  1. What type of surgery is being performed? Gallbladder  2. When is this surgery scheduled? 06/29/19  3. What type of clearance is required (medical clearance vs. Pharmacy clearance to hold med vs. Both)? Meds  4. Are there any medications that need to be held prior to surgery and how long? Plavix   5. Practice name and name of physician performing surgery? [facility]  6. What is your office phone number [phone]   7.   What is your office fax number [phone]  8.   Anesthesia type (None, local, MAC, general) ? general   Ermelinda Das 06/23/2019, 10:59 AM  _________________________________________________________________   (provider comments below)

## 2019-06-26 ENCOUNTER — Other Ambulatory Visit: Payer: Self-pay

## 2019-06-26 ENCOUNTER — Encounter: Payer: Self-pay | Admitting: Hematology & Oncology

## 2019-06-26 ENCOUNTER — Other Ambulatory Visit: Payer: Self-pay | Admitting: Hematology & Oncology

## 2019-06-26 ENCOUNTER — Telehealth: Payer: Self-pay | Admitting: *Deleted

## 2019-06-26 ENCOUNTER — Inpatient Hospital Stay: Payer: Medicare Other

## 2019-06-26 ENCOUNTER — Inpatient Hospital Stay: Payer: Medicare Other | Attending: Hematology & Oncology | Admitting: Hematology & Oncology

## 2019-06-26 VITALS — BP 111/44 | HR 57 | Temp 97.3°F | Resp 18 | Wt 157.0 lb

## 2019-06-26 DIAGNOSIS — C7951 Secondary malignant neoplasm of bone: Secondary | ICD-10-CM | POA: Insufficient documentation

## 2019-06-26 DIAGNOSIS — Z5112 Encounter for antineoplastic immunotherapy: Secondary | ICD-10-CM | POA: Diagnosis not present

## 2019-06-26 DIAGNOSIS — C641 Malignant neoplasm of right kidney, except renal pelvis: Secondary | ICD-10-CM

## 2019-06-26 DIAGNOSIS — Z79899 Other long term (current) drug therapy: Secondary | ICD-10-CM | POA: Diagnosis not present

## 2019-06-26 DIAGNOSIS — Z01818 Encounter for other preprocedural examination: Secondary | ICD-10-CM | POA: Diagnosis not present

## 2019-06-26 DIAGNOSIS — E032 Hypothyroidism due to medicaments and other exogenous substances: Secondary | ICD-10-CM

## 2019-06-26 LAB — CBC WITH DIFFERENTIAL (CANCER CENTER ONLY)
Abs Immature Granulocytes: 0.02 10*3/uL (ref 0.00–0.07)
Basophils Absolute: 0.1 10*3/uL (ref 0.0–0.1)
Basophils Relative: 1 %
Eosinophils Absolute: 0.4 10*3/uL (ref 0.0–0.5)
Eosinophils Relative: 6 %
HCT: 38.9 % — ABNORMAL LOW (ref 39.0–52.0)
Hemoglobin: 12.4 g/dL — ABNORMAL LOW (ref 13.0–17.0)
Immature Granulocytes: 0 %
Lymphocytes Relative: 23 %
Lymphs Abs: 1.3 10*3/uL (ref 0.7–4.0)
MCH: 30.4 pg (ref 26.0–34.0)
MCHC: 31.9 g/dL (ref 30.0–36.0)
MCV: 95.3 fL (ref 80.0–100.0)
Monocytes Absolute: 0.4 10*3/uL (ref 0.1–1.0)
Monocytes Relative: 7 %
Neutro Abs: 3.5 10*3/uL (ref 1.7–7.7)
Neutrophils Relative %: 63 %
Platelet Count: 181 10*3/uL (ref 150–400)
RBC: 4.08 MIL/uL — ABNORMAL LOW (ref 4.22–5.81)
RDW: 15 % (ref 11.5–15.5)
WBC Count: 5.7 10*3/uL (ref 4.0–10.5)
nRBC: 0 % (ref 0.0–0.2)

## 2019-06-26 LAB — CMP (CANCER CENTER ONLY)
ALT: 25 U/L (ref 0–44)
AST: 17 U/L (ref 15–41)
Albumin: 3.6 g/dL (ref 3.5–5.0)
Alkaline Phosphatase: 113 U/L (ref 38–126)
Anion gap: 8 (ref 5–15)
BUN: 45 mg/dL — ABNORMAL HIGH (ref 8–23)
CO2: 28 mmol/L (ref 22–32)
Calcium: 8.7 mg/dL — ABNORMAL LOW (ref 8.9–10.3)
Chloride: 104 mmol/L (ref 98–111)
Creatinine: 1.48 mg/dL — ABNORMAL HIGH (ref 0.61–1.24)
GFR, Est AFR Am: 52 mL/min — ABNORMAL LOW (ref >60)
GFR, Estimated: 45 mL/min — ABNORMAL LOW (ref >60)
Glucose, Bld: 96 mg/dL (ref 70–99)
Potassium: 4 mmol/L (ref 3.5–5.1)
Sodium: 140 mmol/L (ref 135–145)
Total Bilirubin: 0.5 mg/dL (ref 0.3–1.2)
Total Protein: 6.2 g/dL — ABNORMAL LOW (ref 6.5–8.1)

## 2019-06-26 LAB — TSH: TSH: 17.57 u[IU]/mL — ABNORMAL HIGH (ref 0.320–4.118)

## 2019-06-26 MED ORDER — SODIUM CHLORIDE 0.9 % IV SOLN
Freq: Once | INTRAVENOUS | Status: AC
  Administered 2019-06-26: 12:00:00 via INTRAVENOUS
  Filled 2019-06-26: qty 250

## 2019-06-26 MED ORDER — SODIUM CHLORIDE 0.9 % IV SOLN
INTRAVENOUS | Status: DC
  Administered 2019-06-26: 12:00:00 via INTRAVENOUS
  Filled 2019-06-26 (×2): qty 250

## 2019-06-26 MED ORDER — DENOSUMAB 120 MG/1.7ML ~~LOC~~ SOLN
SUBCUTANEOUS | Status: AC
  Filled 2019-06-26: qty 1.7

## 2019-06-26 MED ORDER — DENOSUMAB 120 MG/1.7ML ~~LOC~~ SOLN
120.0000 mg | Freq: Once | SUBCUTANEOUS | Status: AC
  Administered 2019-06-26: 120 mg via SUBCUTANEOUS

## 2019-06-26 MED ORDER — OXYCODONE HCL 10 MG PO TABS: 10.0000 mg | ORAL_TABLET | Freq: Four times a day (QID) | ORAL | 0 refills | Status: DC | PRN

## 2019-06-26 MED ORDER — SODIUM CHLORIDE 0.9 % IV SOLN
200.0000 mg | Freq: Once | INTRAVENOUS | Status: AC
  Administered 2019-06-26: 200 mg via INTRAVENOUS
  Filled 2019-06-26: qty 8

## 2019-06-26 MED ORDER — SODIUM CHLORIDE 0.9 % IV SOLN
Freq: Once | INTRAVENOUS | Status: DC
  Filled 2019-06-26: qty 250

## 2019-06-26 MED FILL — INLYTA 5 MG TABLET: 5 | 28 days supply | Qty: 42 | Fill #0

## 2019-06-26 NOTE — Patient Instructions (Signed)
[facility] Discharge Instructions for Patients Receiving Chemotherapy  Today you received the following chemotherapy agents: Keytruda   To help prevent nausea and vomiting after your treatment, we encourage you to take your nausea medication as directed    If you develop nausea and vomiting that is not controlled by your nausea medication, call the clinic.   BELOW ARE SYMPTOMS THAT SHOULD BE REPORTED IMMEDIATELY:  *FEVER GREATER THAN 100.5 F  *CHILLS WITH OR WITHOUT FEVER  NAUSEA AND VOMITING THAT IS NOT CONTROLLED WITH YOUR NAUSEA MEDICATION  *UNUSUAL SHORTNESS OF BREATH  *UNUSUAL BRUISING OR BLEEDING  TENDERNESS IN MOUTH AND THROAT WITH OR WITHOUT PRESENCE OF ULCERS  *URINARY PROBLEMS  *BOWEL PROBLEMS  UNUSUAL RASH Items with * indicate a potential emergency and should be followed up as soon as possible.  Feel free to call the clinic you have any questions or concerns. The clinic phone number is [phone].  Please show the CHEMO ALERT CARD at check-in to the Emergency Department and triage nurse.   

## 2019-06-26 NOTE — Addendum Note (Signed)
Addended by: Burney Gauze R on: 06/26/2019 11:46 AM   Modules accepted: Orders

## 2019-06-26 NOTE — Telephone Encounter (Signed)
Ok to hold plavix 5 days before surgery. 

## 2019-06-26 NOTE — Addendum Note (Signed)
Addended by: Burney Gauze R on: 06/26/2019 11:22 AM   Modules accepted: Orders

## 2019-06-26 NOTE — Progress Notes (Signed)
Hematology and Oncology Follow Up Visit  [name] [name] [id number] 10-15-1940 78 y.o. 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  The big news is that he is going to have gallbladder surgery this Thursday.  He has cholelithiasis.  He has had no acute cholecystitis as of yet.  Apparently, he has large gallstones.  He will have surgery at [facility].  This is going to be as an outpatient.  I told him not to take the [place] until October 19.  I want him off the Inlyta for a couple weeks to try to make sure that he heals up.  I do not see any problems with him getting the pembrolizumab.  He has had no issues with bowels or bladder.  He has had no nausea or vomiting.  He has had no bleeding.  He does have the pain issues because of his metastatic disease in his bones.  He has had no fever.  He is still working.  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  Overall, his performance status is ECOG 1.    This  Medications:  Allergies as of 06/26/2019      Reactions   Doxycycline Other (See Comments)   Flushing of hands and face.       Medication List       Accurate as of June 26, 2019 10:58 AM. If you have any questions, ask your nurse or doctor.        AMBULATORY NON FORMULARY MEDICATION Medication Name: Overnight pulse ox. Please fax to [facility]. Dx of COPD and SOB at night.   amLODipine 10 MG tablet Commonly known as: NORVASC TAKE 1 TABLET BY MOUTH DAILY   carvedilol 25 MG tablet Commonly known as: COREG Take 1 tablet (25 mg total) by mouth 2 (two) times daily.   clopidogrel 75 MG tablet Commonly known as: PLAVIX TAKE 1 TABLET BY MOUTH EVERY DAY    CONTOUR BLOOD GLUCOSE SYSTEM Devi Diagnoses - Diabetes E 11.8 Check blood sugar once daily   gabapentin 300 MG capsule Commonly known as: NEURONTIN One tab PO qHS for a week, then BID for a week, then TID. May double weekly to a max of 3,660m/day. MUST SCHEDULE APPT   glucose blood test strip Commonly known as: BEstate manager/land agentDiagnoses - Diabetes E 11.8 Check blood sugar once daily   hydrALAZINE 25 MG tablet Commonly known as: APRESOLINE TAKE 1 TABLET(25 MG) BY MOUTH THREE TIMES DAILY   Inlyta 5 MG tablet Generic drug: axitinib TAKE 1 TABLET (5MG) BY MOUTH TWICE DAILY FOR 21 DAYS ON, THEN 7 DAYS OFF.   levothyroxine 75 MCG tablet Commonly known as: SYNTHROID TAKE 1 TABLET(75 MCG) BY MOUTH DAILY BEFORE BREAKFAST   losartan-hydrochlorothiazide 50-12.5 MG tablet Commonly known as: HYZAAR Take 1 tablet by mouth 2 (two) times a day.   metFORMIN 500 MG 24 hr tablet Commonly known as: GLUCOPHAGE-XR TAKE 2 TABLETS BY MOUTH EVERY DAY WITH BREAKFAST   multivitamin with minerals tablet Take 1 tablet by mouth daily.   Oxycodone HCl 10 MG Tabs Take 1 tablet (10 mg total) by mouth every 6 (six) hours as needed.  ProAir HFA 108 (90 Base) MCG/ACT inhaler Generic drug: albuterol INHALE 2 PUFFS BY MOUTH INTO THE LUNGS EVERY 6 HOURS AS DIRECTED AS NEEDED FOR WHEEZING   prochlorperazine 10 MG tablet Commonly known as: COMPAZINE TAKE 1 TABLET(10 MG) BY MOUTH EVERY 6 HOURS AS NEEDED FOR NAUSEA OR VOMITING   tamsulosin 0.4 MG Caps capsule Commonly known as: Flomax Take 1 capsule (0.4 mg total) by mouth 2 (two) times daily.   Trelegy Ellipta 100-62.5-25 MCG/INH Aepb Generic drug: Fluticasone-Umeclidin-Vilant Inhale 1 puff into the lungs daily.   Xtampza ER 13.5 MG C12a Generic drug: oxyCODONE ER Take 1 capsule by mouth every 12 (twelve) hours.       Allergies:  Allergies  Allergen Reactions  . Doxycycline Other (See Comments)    Flushing of hands and face.     Past  Medical History, Surgical history, Social history, and Family History were reviewed and updated.  Review of Systems: Review of Systems  Constitutional: Negative.   HENT: Negative.   Eyes: Negative.   Respiratory: Negative.   Cardiovascular: Negative.   Gastrointestinal: Negative.   Genitourinary: Negative.   Musculoskeletal: Negative.   Skin: Negative.   Neurological: Negative.   Endo/Heme/Allergies: Negative.   Psychiatric/Behavioral: Negative.      Physical Exam:  weight is 157 lb (71.2 kg). His temporal temperature is 97.3 F (36.3 C) (abnormal). His blood pressure is 111/44 (abnormal) and his pulse is 57 (abnormal). His respiration is 18 and oxygen saturation is 100%.   Wt Readings from Last 3 Encounters:  06/26/19 157 lb (71.2 kg)  06/19/19 154 lb (69.9 kg)  06/16/19 157 lb (71.2 kg)    Physical Exam Vitals signs reviewed.  HENT:     Head: Normocephalic and atraumatic.  Eyes:     Pupils: Pupils are equal, round, and reactive to light.  Neck:     Musculoskeletal: Normal range of motion.  Cardiovascular:     Rate and Rhythm: Normal rate and regular rhythm.     Heart sounds: Normal heart sounds.  Pulmonary:     Effort: Pulmonary effort is normal.     Breath sounds: Normal breath sounds.  Abdominal:     General: Bowel sounds are normal.     Palpations: Abdomen is soft.     Comments: Abdominal exam shows the tic wound in the left lower quadrant.  Is erythematous.  Is slightly firm.  It probably measures about 1 cm x 1.5 cm.  There is no hepatomegaly.  There is no splenomegaly.  Musculoskeletal: Normal range of motion.        General: No tenderness or deformity.  Lymphadenopathy:     Cervical: No cervical adenopathy.  Skin:    General: Skin is warm and dry.     Findings: No erythema or rash.  Neurological:     Mental Status: He is alert and oriented to person, place, and time.  Psychiatric:        Behavior: Behavior normal.        Thought Content: Thought  content normal.        Judgment: Judgment normal.       Lab Results  Component Value Date   WBC 5.7 06/26/2019   HGB 12.4 (L) 06/26/2019   HCT 38.9 (L) 06/26/2019   MCV 95.3 06/26/2019   PLT 181 06/26/2019   Lab Results  Component Value Date   FERRITIN 95 10/01/2017   Lab Results  Component Value Date   RBC 4.08 (L) 06/26/2019   Lab  Results  Component Value Date   KPAFRELGTCHN 28.2 (H) 05/05/2018   LAMBDASER 24.6 05/05/2018   KAPLAMBRATIO 5.75 05/09/2018   Lab Results  Component Value Date   IGGSERUM 799 05/05/2018   IGA 229 05/05/2018   IGMSERUM 136 05/05/2018   Lab Results  Component Value Date   TOTALPROTELP 6.6 05/05/2018   ALBUMINELP 3.4 05/05/2018   A1GS 0.3 05/05/2018   A2GS 1.1 (H) 05/05/2018   BETS 0.9 05/05/2018   BETA2SER 0.4 04/29/2018   GAMS 0.8 05/05/2018   MSPIKE Not Observed 05/05/2018   SPEI  04/29/2018     Comment:     . Evaluation is consistent with an acute inflammatory  pattern. . . A faint abnormal protein band is detected in the gamma globulins that most likely represents CRP, or circulating immune complexes, rather than a monoclonal immunoglobulin. . . Immunofixation analysis is available if identification of the band(s) is clinically indicated. .      Chemistry      Component Value Date/Time   NA 140 06/26/2019 0940   NA 140 11/25/2018 1055   K 4.0 06/26/2019 0940   CL 104 06/26/2019 0940   CO2 28 06/26/2019 0940   BUN 45 (H) 06/26/2019 0940   BUN 20 11/25/2018 1055   CREATININE 1.48 (H) 06/26/2019 0940   CREATININE 1.07 06/02/2019 0901      Component Value Date/Time   CALCIUM 8.7 (L) 06/26/2019 0940   ALKPHOS 113 06/26/2019 0940   AST 17 06/26/2019 0940   ALT 25 06/26/2019 0940   BILITOT 0.5 06/26/2019 0940      Impression and Plan: [name] [name] is a very pleasant 78 yo caucasian gentleman with metastatic kidney cancer. He also has history of prostate cancer and melanoma in situ.   I am sure that he will have  no problems with his surgery.  Again, this will be outpatient.  It will be done laparoscopically.  I would like to think that the surgeon will look around a little bit to make sure that cancer is not an issue.  Again, he knows not to restart the Inlyta until October 19.  We will treat with the pembrolizumab today.  I will have him come back in about 4 weeks now just to give him an extra week off so that he can fully recover from surgery.  We probably need to get scans on him when we see him back.    30 minutes with him today.  We had a talk about his surgery and changes that we have to make so that he can get through surgery safely.  I have to order his CT scans.  I had to readjust his pembrolizumab protocol.   Marland Kitchen    [name], MD 10/5/202010:58 AM

## 2019-06-26 NOTE — Telephone Encounter (Signed)
Dr. Johnsie Cancel  Can you please comment on this patient's Plavix therapy?  He is scheduled for cholecystectomy surgery 06/29/2019 in which surgical team is asking for holding recommendations for his Plavix.  He has a prior history of remote stenting to his OM in 1996 per chart review.  You last saw him 06/19/2019 in which he was doing well from a cardiac perspective.  He denied anginal symptoms.  Repeat carotid Dopplers negative for stenosis 11/14/2018.  Please send your recommendations to the preoperative pool  Thank you Sharee Pimple

## 2019-06-26 NOTE — Telephone Encounter (Signed)
-----   Message from [name] Napoleon, MD sent at 06/26/2019  2:49 PM EDT ----- Call - need to increase the Synthroid up to 166mcg daily. Please call in the change in dose of Synthroid and please let him know!!!  Laurey Arrow

## 2019-06-26 NOTE — Telephone Encounter (Signed)
   Primary Cardiologist: Jenkins Rouge, MD  Chart reviewed as part of pre-operative protocol coverage. Given past medical history and time since last visit, based on ACC/AHA guidelines, [name] [name] would be at acceptable risk for the planned procedure without further cardiovascular testing.   Per Dr. Johnsie Cancel, patient can hold Plavix therapy 5 days prior to procedure then resume once okay per surgical team.  I will route this recommendation to the requesting party via [facility] fax function and remove from pre-op pool.  Please call with questions.  Kathyrn Drown, NP 06/26/2019, 10:53 AM

## 2019-06-27 ENCOUNTER — Other Ambulatory Visit: Payer: Self-pay | Admitting: *Deleted

## 2019-06-27 DIAGNOSIS — E032 Hypothyroidism due to medicaments and other exogenous substances: Secondary | ICD-10-CM

## 2019-06-27 MED ORDER — LEVOTHYROXINE SODIUM 100 MCG PO TABS: 100.0000 ug | ORAL_TABLET | Freq: Every day | ORAL | 2 refills | Status: DC

## 2019-06-29 DIAGNOSIS — Z87891 Personal history of nicotine dependence: Secondary | ICD-10-CM | POA: Diagnosis not present

## 2019-06-29 DIAGNOSIS — I10 Essential (primary) hypertension: Secondary | ICD-10-CM | POA: Diagnosis not present

## 2019-06-29 DIAGNOSIS — Z7982 Long term (current) use of aspirin: Secondary | ICD-10-CM | POA: Diagnosis not present

## 2019-06-29 DIAGNOSIS — Z7984 Long term (current) use of oral hypoglycemic drugs: Secondary | ICD-10-CM | POA: Diagnosis not present

## 2019-06-29 DIAGNOSIS — I252 Old myocardial infarction: Secondary | ICD-10-CM | POA: Diagnosis not present

## 2019-06-29 DIAGNOSIS — K8010 Calculus of gallbladder with chronic cholecystitis without obstruction: Secondary | ICD-10-CM | POA: Diagnosis not present

## 2019-06-29 DIAGNOSIS — J449 Chronic obstructive pulmonary disease, unspecified: Secondary | ICD-10-CM | POA: Diagnosis not present

## 2019-06-29 DIAGNOSIS — Z79899 Other long term (current) drug therapy: Secondary | ICD-10-CM | POA: Diagnosis not present

## 2019-06-29 DIAGNOSIS — Z85038 Personal history of other malignant neoplasm of large intestine: Secondary | ICD-10-CM | POA: Diagnosis not present

## 2019-06-29 DIAGNOSIS — Z955 Presence of coronary angioplasty implant and graft: Secondary | ICD-10-CM | POA: Diagnosis not present

## 2019-06-29 DIAGNOSIS — K8020 Calculus of gallbladder without cholecystitis without obstruction: Secondary | ICD-10-CM | POA: Diagnosis not present

## 2019-06-29 DIAGNOSIS — Z7902 Long term (current) use of antithrombotics/antiplatelets: Secondary | ICD-10-CM | POA: Diagnosis not present

## 2019-06-29 DIAGNOSIS — E119 Type 2 diabetes mellitus without complications: Secondary | ICD-10-CM | POA: Diagnosis not present

## 2019-07-05 ENCOUNTER — Telehealth: Payer: Self-pay

## 2019-07-05 NOTE — Telephone Encounter (Signed)
Pt advised ok for in office appt

## 2019-07-05 NOTE — Telephone Encounter (Signed)
Patient calls with complains of bilateral leg swelling, left more swollen than right. No weeping. Swelling is from feet up to mid-shin.   Has been keeping legs elevated. Does not use compression socks.   Patient completed Prednisone given by Dr T and is wanting to have Dr Madilyn Fireman to see him in person to check on his legs and his breathing. Denies cough, fever, congestion, or feeling ill.   I have added him to be seen in office tomorrow at 10:00.Marland Kitchen is this OK ?

## 2019-07-05 NOTE — Telephone Encounter (Signed)
Prefect thank you

## 2019-07-06 ENCOUNTER — Encounter: Payer: Self-pay | Admitting: Family Medicine

## 2019-07-06 ENCOUNTER — Telehealth: Payer: Self-pay | Admitting: Family Medicine

## 2019-07-06 ENCOUNTER — Other Ambulatory Visit: Payer: Self-pay

## 2019-07-06 ENCOUNTER — Ambulatory Visit (INDEPENDENT_AMBULATORY_CARE_PROVIDER_SITE_OTHER): Payer: Medicare Other | Admitting: Family Medicine

## 2019-07-06 VITALS — BP 132/66 | HR 62 | Ht 73.0 in | Wt 162.0 lb

## 2019-07-06 DIAGNOSIS — R7989 Other specified abnormal findings of blood chemistry: Secondary | ICD-10-CM

## 2019-07-06 DIAGNOSIS — M7989 Other specified soft tissue disorders: Secondary | ICD-10-CM

## 2019-07-06 DIAGNOSIS — R0989 Other specified symptoms and signs involving the circulatory and respiratory systems: Secondary | ICD-10-CM

## 2019-07-06 DIAGNOSIS — J441 Chronic obstructive pulmonary disease with (acute) exacerbation: Secondary | ICD-10-CM

## 2019-07-06 LAB — COMPLETE METABOLIC PANEL WITH GFR
AG Ratio: 1.6 (calc) (ref 1.0–2.5)
ALT: 9 U/L (ref 9–46)
AST: 12 U/L (ref 10–35)
Albumin: 3.5 g/dL — ABNORMAL LOW (ref 3.6–5.1)
Alkaline phosphatase (APISO): 87 U/L (ref 35–144)
BUN/Creatinine Ratio: 26 (calc) — ABNORMAL HIGH (ref 6–22)
BUN: 30 mg/dL — ABNORMAL HIGH (ref 7–25)
CO2: 30 mmol/L (ref 20–32)
Calcium: 9.6 mg/dL (ref 8.6–10.3)
Chloride: 104 mmol/L (ref 98–110)
Creat: 1.17 mg/dL (ref 0.70–1.18)
GFR, Est African American: 69 mL/min/{1.73_m2} (ref >=60)
GFR, Est Non African American: 60 mL/min/{1.73_m2} (ref >=60)
Globulin: 2.2 g/dL (calc) (ref 1.9–3.7)
Glucose, Bld: 103 mg/dL — ABNORMAL HIGH (ref 65–99)
Potassium: 3.8 mmol/L (ref 3.5–5.3)
Sodium: 140 mmol/L (ref 135–146)
Total Bilirubin: 0.6 mg/dL (ref 0.2–1.2)
Total Protein: 5.7 g/dL — ABNORMAL LOW (ref 6.1–8.1)

## 2019-07-06 LAB — D-DIMER, QUANTITATIVE: D-Dimer, Quant: 0.95 mcg/mL FEU — ABNORMAL HIGH (ref <0.50)

## 2019-07-06 LAB — MICROALBUMIN / CREATININE URINE RATIO
Creatinine, Urine: 96 mg/dL (ref 20–320)
Microalb Creat Ratio: 1623 mcg/mg creat — ABNORMAL HIGH (ref <30)
Microalb, Ur: 155.8 mg/dL

## 2019-07-06 MED ORDER — PREDNISONE 20 MG PO TABS: 40.0000 mg | ORAL_TABLET | Freq: Every day | ORAL | 0 refills | Status: DC

## 2019-07-06 NOTE — Progress Notes (Signed)
Acute Office Visit  Subjective:    Patient ID: [name] [name], male    DOB: [birth year], 78 y.o.   MRN: [id number]  Chief Complaint  Patient presents with  . Leg Swelling    x 10days bilateral worse on L    HPI Patient is in today for leg swelling x 10 days, worse on the left. No new SOB or chest pain.  He just had an echocardiogram 4 weeks ago.  EF was 60 to 65% normal left ventricular function.  Some mild mitral stenosis.  A little bit of tricuspid valve regurg but trivial.  Mild aortic valve sclerosis without stenosis.  He also had labs about 10 days ago with a hemoglobin of 12.4 which is about where he has been trending over the last month.  Kidney function is up a little bit at 1.4 and calcium was a little bit low at 8.7.  Routine level had dropped a little bit to 6.2.  TSH was also quite elevated at 17 and his thyroid dose was increased to 100 mcg daily.  He picked up the new prescription and says he just started it this morning.   COPD-he was switched to Trelegy recently, about 3 weeks ago.  He had actually come in to see one of my partners for shortness of breath around September 25.  He was given a short course of prednisone and says he felt fantastic on it.  He has had so his Anoro was changed to Trelegy since being on the trilogy as he says he has not noticed a big difference in his breathing.  He still feels like he gets short of breath and still gets chronic sputum production.  Most days he says it is either green or yellow occasionally it will be clear.Marland Kitchen  He did have gallbladder surgery on October 8.  But he has been walking and mobile.  In regards to his ingrown nail on his left foot he has been getting his son to trim it and that seems to work it keeps it comfortable for a good 2 to 3 weeks and then his son has to just retreatment.  Past Medical History:  Diagnosis Date  . Allergic rhinitis   . Bigeminy   . Bradycardia   . Bruit   . CAD (coronary artery disease)   .  Claudication ([place])   . Diabetes mellitus type II   . Dyspnea   . Goals of care, counseling/discussion 06/02/2018  . History of prostate cancer    Dr. Gaynelle Arabian  . Hypercholesteremia    II A  . Hypertension   . Kidney cancer, primary, with metastasis from kidney to other site, right ([place]) 06/02/2018  . Malignant neoplasm of prostate ([place])   . Microalbuminuria   . Seborrheic keratosis     Past Surgical History:  Procedure Laterality Date  . CORONARY ANGIOPLASTY WITH STENT PLACEMENT    . IR FLUORO RM 30-60 MIN  05/24/2018    Family History  Problem Relation Age of Onset  . Heart attack Father        Acute MI  . Coronary artery disease Brother     Social History   Socioeconomic History  . Marital status: Widowed    Spouse name: Not on file  . Number of children: Not on file  . Years of education: Not on file  . Highest education level: Not on file  Occupational History  . Occupation: Retired but still works Dentist:  RETIRED  Social Needs  . Financial resource strain: Not on file  . Food insecurity    Worry: Not on file    Inability: Not on file  . Transportation needs    Medical: Not on file    Non-medical: Not on file  Tobacco Use  . Smoking status: Former Smoker    Packs/day: 1.00    Years: 20.00    Pack years: 20.00    Quit date: 04/22/2003    Years since quitting: 16.2  . Smokeless tobacco: Never Used  Substance and Sexual Activity  . Alcohol use: Not Currently    Alcohol/week: 1.0 standard drinks    Types: 1 Standard drinks or equivalent per week  . Drug use: Never  . Sexual activity: Not Currently  Lifestyle  . Physical activity    Days per week: Not on file    Minutes per session: Not on file  . Stress: Not on file  Relationships  . Social Herbalist on phone: Not on file    Gets together: Not on file    Attends religious service: Not on file    Active member of club or organization: Not on file    Attends meetings of  clubs or organizations: Not on file    Relationship status: Not on file  . Intimate partner violence    Fear of current or ex partner: Not on file    Emotionally abused: Not on file    Physically abused: Not on file    Forced sexual activity: Not on file  Other Topics Concern  . Not on file  Social History Narrative   Widowed 08/2009   Has 4 kids, 2 are local   Fair diet   No regular exercise    Outpatient Medications Prior to Visit  Medication Sig Dispense Refill  . AMBULATORY NON FORMULARY MEDICATION Medication Name: Overnight pulse ox. Please fax to [facility]. Dx of COPD and SOB at night. 1 vial 0  . amLODipine (NORVASC) 10 MG tablet TAKE 1 TABLET BY MOUTH DAILY 90 tablet 1  . Blood Glucose Monitoring Suppl (CONTOUR BLOOD GLUCOSE SYSTEM) DEVI Diagnoses - Diabetes E 11.8 Check blood sugar once daily 1 Device 0  . carvedilol (COREG) 25 MG tablet Take 1 tablet (25 mg total) by mouth 2 (two) times daily. 180 tablet 2  . clopidogrel (PLAVIX) 75 MG tablet TAKE 1 TABLET BY MOUTH EVERY DAY 90 tablet 3  . Fluticasone-Umeclidin-Vilant (TRELEGY ELLIPTA) 100-62.5-25 MCG/INH AEPB Inhale 1 puff into the lungs daily. 1 each 11  . gabapentin (NEURONTIN) 300 MG capsule One tab PO qHS for a week, then BID for a week, then TID. May double weekly to a max of 3,600mg /day. MUST SCHEDULE APPT 90 capsule 0  . glucose blood (BAYER CONTOUR TEST) test strip Diagnoses - Diabetes E 11.8 Check blood sugar once daily 100 each prn  . hydrALAZINE (APRESOLINE) 25 MG tablet TAKE 1 TABLET(25 MG) BY MOUTH THREE TIMES DAILY 180 tablet 4  . INLYTA 5 MG tablet TAKE 1 TABLET (5MG ) BY MOUTH TWICE DAILY FOR 21 DAYS ON, THEN 7 DAYS OFF. 42 tablet 3  . levothyroxine (SYNTHROID) 100 MCG tablet Take 1 tablet (100 mcg total) by mouth daily before breakfast. 90 tablet 2  . losartan-hydrochlorothiazide (HYZAAR) 50-12.5 MG tablet Take 1 tablet by mouth 2 (two) times a day. 60 tablet 3  . metFORMIN (GLUCOPHAGE-XR) 500 MG 24 hr tablet TAKE  2 TABLETS BY MOUTH EVERY DAY WITH BREAKFAST 180  tablet 1  . mometasone (ASMANEX) 220 MCG/INH inhaler Inhale into the lungs.    . Multiple Vitamins-Minerals (MULTIVITAMIN WITH MINERALS) tablet Take 1 tablet by mouth daily.    . Oxycodone HCl 10 MG TABS Take 1 tablet (10 mg total) by mouth every 6 (six) hours as needed. 90 tablet 0  . PROAIR HFA 108 (90 Base) MCG/ACT inhaler INHALE 2 PUFFS BY MOUTH INTO THE LUNGS EVERY 6 HOURS AS DIRECTED AS NEEDED FOR WHEEZING 8.5 g 2  . prochlorperazine (COMPAZINE) 10 MG tablet TAKE 1 TABLET(10 MG) BY MOUTH EVERY 6 HOURS AS NEEDED FOR NAUSEA OR VOMITING 385 tablet 1  . tamsulosin (FLOMAX) 0.4 MG CAPS capsule Take 1 capsule (0.4 mg total) by mouth 2 (two) times daily. 60 capsule 3  . XTAMPZA ER 13.5 MG C12A Take 1 capsule by mouth every 12 (twelve) hours. 60 capsule 0   No facility-administered medications prior to visit.     Allergies  Allergen Reactions  . Doxycycline Other (See Comments)    Flushing of hands and face.     ROS     Objective:    Physical Exam  Constitutional: He is oriented to person, place, and time. He appears well-developed and well-nourished.  HENT:  Head: Normocephalic and atraumatic.  Eyes: Conjunctivae are normal.  Cardiovascular: Normal rate, regular rhythm and normal heart sounds.  Pulmonary/Chest: Effort normal and breath sounds normal.  Musculoskeletal:     Comments: Trace pitting edema in the right lower extremity up about the mid tibia.  1+ pitting edema on the left ankle and foot to just below the knee on the left side.  No breaks in the skin.  He does have some increased warmth medially on both lower legs and some hyperpigmentation.  He has decreased capillary refill in both toes, between 3 to 4 seconds.  Dorsal pedal pulses are just barely palpable.  Neurological: He is alert and oriented to person, place, and time.  Skin: Skin is warm and dry.  Psychiatric: He has a normal mood and affect. His behavior is normal.     BP 132/66   Pulse 62   Ht 6\' 1"  (1.854 m)   Wt 162 lb (73.5 kg)   SpO2 100%   BMI 21.37 kg/m  Wt Readings from Last 3 Encounters:  07/06/19 162 lb (73.5 kg)  06/26/19 157 lb (71.2 kg)  06/19/19 154 lb (69.9 kg)    There are no preventive care reminders to display for this patient.  There are no preventive care reminders to display for this patient.   Lab Results  Component Value Date   TSH 17.570 (H) 06/26/2019   Lab Results  Component Value Date   WBC 5.7 06/26/2019   HGB 12.4 (L) 06/26/2019   HCT 38.9 (L) 06/26/2019   MCV 95.3 06/26/2019   PLT 181 06/26/2019   Lab Results  Component Value Date   NA 140 07/06/2019   K 3.8 07/06/2019   CO2 30 07/06/2019   GLUCOSE 103 (H) 07/06/2019   BUN 30 (H) 07/06/2019   CREATININE 1.17 07/06/2019   BILITOT 0.6 07/06/2019   ALKPHOS 113 06/26/2019   AST 12 07/06/2019   ALT 9 07/06/2019   PROT 5.7 (L) 07/06/2019   ALBUMIN 3.6 06/26/2019   CALCIUM 9.6 07/06/2019   ANIONGAP 8 06/26/2019   Lab Results  Component Value Date   CHOL 89 04/14/2017   Lab Results  Component Value Date   HDL 28 (L) 04/14/2017   Lab Results  Component Value Date   LDLCALC 69 09/09/2015   Lab Results  Component Value Date   TRIG 94 04/14/2017   Lab Results  Component Value Date   CHOLHDL 3.2 04/14/2017   Lab Results  Component Value Date   HGBA1C 5.8 (A) 02/20/2019       Assessment & Plan:   Problem List Items Addressed This Visit    None    Visit Diagnoses    Localized swelling of both lower extremities    -  Primary   Relevant Orders   Urine Microalbumin w/creat. ratio (Completed)   COMPLETE METABOLIC PANEL WITH GFR (Completed)   D-dimer, quantitative (not at [facility]) (Completed)   VAS [place] ABI WITH/WO TBI   VAS [place] LOWER EXTREMITY VENOUS (DVT)   Decreased tissue perfusion       Relevant Orders   Urine Microalbumin w/creat. ratio (Completed)   COMPLETE METABOLIC PANEL WITH GFR (Completed)   D-dimer, quantitative (not at  [facility]) (Completed)   VAS [place] ABI WITH/WO TBI   VAS [place] LOWER EXTREMITY VENOUS (DVT)   COPD exacerbation (H[facility])       Relevant Medications   mometasone (ASMANEX) 220 MCG/INH inhaler   predniSONE (DELTASONE) 20 MG tablet   Elevated d-dimer       Relevant Orders   VAS [place] LOWER EXTREMITY VENOUS (DVT)     Lower extremity swelling, worse on the left.  It is bilateral which is reassuring but he just had surgery about 6 days ago and I am worried about possible postsurgical DVT.  Though he has been up and moving he has not been laying around which is great.  We will get a D-dimer today for further work-up.  So recheck renal function to make sure there is no problems there.  He does have a delayed capillary refill in his great toes bilaterally.  We will schedule for ABIs for further work-up.  COPD exacerbation -we will go ahead and treat with another round of prednisone but we did have a long discussion about potential side effects of the medication and especially recurrent and frequent use.  And that really should be used sparingly.  Unfortunately he has not noticed a big difference in switching from Anoro to Trelegy as I was hoping he would.  Meds ordered this encounter  Medications  . predniSONE (DELTASONE) 20 MG tablet    Sig: Take 2 tablets (40 mg total) by mouth daily with breakfast.    Dispense:  10 tablet    Refill:  0     Beatrice Lecher, MD

## 2019-07-06 NOTE — Telephone Encounter (Signed)
Order changed per radiology protocol.  

## 2019-07-07 ENCOUNTER — Ambulatory Visit: Payer: Medicare Other

## 2019-07-07 ENCOUNTER — Other Ambulatory Visit: Payer: Self-pay

## 2019-07-07 DIAGNOSIS — M7989 Other specified soft tissue disorders: Secondary | ICD-10-CM | POA: Diagnosis not present

## 2019-07-07 DIAGNOSIS — R0989 Other specified symptoms and signs involving the circulatory and respiratory systems: Secondary | ICD-10-CM

## 2019-07-07 DIAGNOSIS — R7989 Other specified abnormal findings of blood chemistry: Secondary | ICD-10-CM

## 2019-07-14 ENCOUNTER — Other Ambulatory Visit: Payer: Self-pay | Admitting: Sports Medicine

## 2019-07-17 ENCOUNTER — Other Ambulatory Visit: Payer: Medicare Other

## 2019-07-17 ENCOUNTER — Ambulatory Visit: Payer: Medicare Other | Admitting: Hematology & Oncology

## 2019-07-17 ENCOUNTER — Ambulatory Visit: Payer: Medicare Other

## 2019-07-18 ENCOUNTER — Other Ambulatory Visit: Payer: Self-pay | Admitting: *Deleted

## 2019-07-18 DIAGNOSIS — I10 Essential (primary) hypertension: Secondary | ICD-10-CM

## 2019-07-18 MED ORDER — LOSARTAN POTASSIUM-HCTZ 50-12.5 MG PO TABS: 1.0000 | ORAL_TABLET | Freq: Two times a day (BID) | ORAL | 3 refills | Status: DC

## 2019-07-19 ENCOUNTER — Ambulatory Visit (HOSPITAL_COMMUNITY): Payer: Medicare Other

## 2019-07-21 ENCOUNTER — Other Ambulatory Visit: Payer: Self-pay

## 2019-07-21 ENCOUNTER — Ambulatory Visit (HOSPITAL_COMMUNITY)
Admission: RE | Admit: 2019-07-21 | Discharge: 2019-07-21 | Disposition: A | Discharge status: Home / Self Care | Payer: Medicare Other | Source: Ambulatory Visit | Attending: Family Medicine | Admitting: Family Medicine

## 2019-07-21 DIAGNOSIS — R0989 Other specified symptoms and signs involving the circulatory and respiratory systems: Secondary | ICD-10-CM

## 2019-07-21 DIAGNOSIS — M7989 Other specified soft tissue disorders: Secondary | ICD-10-CM | POA: Insufficient documentation

## 2019-07-21 NOTE — Progress Notes (Signed)
ABI's have been completed. Preliminary results can be found in CV Proc through chart review.   07/21/19 2:50 PM [name] [name] RVT

## 2019-07-27 ENCOUNTER — Other Ambulatory Visit: Payer: Self-pay

## 2019-07-27 ENCOUNTER — Inpatient Hospital Stay: Payer: Medicare Other | Attending: Hematology & Oncology

## 2019-07-27 ENCOUNTER — Encounter: Payer: Self-pay | Admitting: Hematology & Oncology

## 2019-07-27 ENCOUNTER — Inpatient Hospital Stay: Payer: Medicare Other

## 2019-07-27 ENCOUNTER — Inpatient Hospital Stay (HOSPITAL_BASED_OUTPATIENT_CLINIC_OR_DEPARTMENT_OTHER): Payer: Medicare Other | Admitting: Hematology & Oncology

## 2019-07-27 ENCOUNTER — Ambulatory Visit (HOSPITAL_BASED_OUTPATIENT_CLINIC_OR_DEPARTMENT_OTHER)
Admission: RE | Admit: 2019-07-27 | Discharge: 2019-07-27 | Disposition: A | Discharge status: Home / Self Care | Payer: Medicare Other | Source: Ambulatory Visit | Attending: Hematology & Oncology | Admitting: Hematology & Oncology

## 2019-07-27 VITALS — BP 157/78 | HR 79 | Temp 97.1°F | Resp 18 | Wt 155.5 lb

## 2019-07-27 DIAGNOSIS — Z5112 Encounter for antineoplastic immunotherapy: Secondary | ICD-10-CM | POA: Diagnosis not present

## 2019-07-27 DIAGNOSIS — C641 Malignant neoplasm of right kidney, except renal pelvis: Secondary | ICD-10-CM

## 2019-07-27 DIAGNOSIS — C7951 Secondary malignant neoplasm of bone: Secondary | ICD-10-CM | POA: Insufficient documentation

## 2019-07-27 DIAGNOSIS — C649 Malignant neoplasm of unspecified kidney, except renal pelvis: Secondary | ICD-10-CM | POA: Diagnosis not present

## 2019-07-27 LAB — CBC WITH DIFFERENTIAL (CANCER CENTER ONLY)
Abs Immature Granulocytes: 0.04 10*3/uL (ref 0.00–0.07)
Basophils Absolute: 0 10*3/uL (ref 0.0–0.1)
Basophils Relative: 0 %
Eosinophils Absolute: 0.2 10*3/uL (ref 0.0–0.5)
Eosinophils Relative: 3 %
HCT: 42.3 % (ref 39.0–52.0)
Hemoglobin: 13.5 g/dL (ref 13.0–17.0)
Immature Granulocytes: 1 %
Lymphocytes Relative: 16 %
Lymphs Abs: 1.2 10*3/uL (ref 0.7–4.0)
MCH: 29.1 pg (ref 26.0–34.0)
MCHC: 31.9 g/dL (ref 30.0–36.0)
MCV: 91.2 fL (ref 80.0–100.0)
Monocytes Absolute: 0.5 10*3/uL (ref 0.1–1.0)
Monocytes Relative: 7 %
Neutro Abs: 5.6 10*3/uL (ref 1.7–7.7)
Neutrophils Relative %: 73 %
Platelet Count: 134 10*3/uL — ABNORMAL LOW (ref 150–400)
RBC: 4.64 MIL/uL (ref 4.22–5.81)
RDW: 13.8 % (ref 11.5–15.5)
WBC Count: 7.6 10*3/uL (ref 4.0–10.5)
nRBC: 0 % (ref 0.0–0.2)

## 2019-07-27 LAB — CMP (CANCER CENTER ONLY)
ALT: 21 U/L (ref 0–44)
AST: 21 U/L (ref 15–41)
Albumin: 3.8 g/dL (ref 3.5–5.0)
Alkaline Phosphatase: 93 U/L (ref 38–126)
Anion gap: 8 (ref 5–15)
BUN: 35 mg/dL — ABNORMAL HIGH (ref 8–23)
CO2: 34 mmol/L — ABNORMAL HIGH (ref 22–32)
Calcium: 10.3 mg/dL (ref 8.9–10.3)
Chloride: 96 mmol/L — ABNORMAL LOW (ref 98–111)
Creatinine: 1.2 mg/dL (ref 0.61–1.24)
GFR, Est AFR Am: >60 mL/min (ref >60)
GFR, Estimated: 58 mL/min — ABNORMAL LOW (ref >60)
Glucose, Bld: 104 mg/dL — ABNORMAL HIGH (ref 70–99)
Potassium: 4.4 mmol/L (ref 3.5–5.1)
Sodium: 138 mmol/L (ref 135–145)
Total Bilirubin: 0.6 mg/dL (ref 0.3–1.2)
Total Protein: 6.4 g/dL — ABNORMAL LOW (ref 6.5–8.1)

## 2019-07-27 LAB — LACTATE DEHYDROGENASE: LDH: 168 U/L (ref 98–192)

## 2019-07-27 MED ORDER — SODIUM CHLORIDE 0.9 % IV SOLN
Freq: Once | INTRAVENOUS | Status: AC
  Administered 2019-07-27: 10:00:00 via INTRAVENOUS
  Filled 2019-07-27: qty 250

## 2019-07-27 MED ORDER — SODIUM CHLORIDE 0.9 % IV SOLN
200.0000 mg | Freq: Once | INTRAVENOUS | Status: AC
  Administered 2019-07-27: 200 mg via INTRAVENOUS
  Filled 2019-07-27: qty 8

## 2019-07-27 MED ORDER — IOHEXOL 300 MG/ML  SOLN
100.0000 mL | Freq: Once | INTRAMUSCULAR | Status: AC | PRN
  Administered 2019-07-27: 100 mL via INTRAVENOUS

## 2019-07-27 NOTE — Progress Notes (Signed)
Hematology and Oncology Follow Up Visit  [name] [name] [id number] 12-03-1940 78 y.o. 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  He really looks quite good.  This is considering the fact that he had a cholecystectomy done a couple weeks ago.  I must say, that Dr. Arvin Collard did a fantastic job.  He looks around the abdominal cavity and did not see anything that was obviously metastatic renal cell carcinoma.  [name] [name] is recovering.  He is still working.  He is still selling rope.  He did restart the axitinib.  I have him not take the axitinib for a couple weeks so that there would not be any issues with respect to healing with surgery.  He has had no headache.  His pain seems to be doing pretty well.  His appetite has really not been diminished by his surgery.  He has had no leg swelling.  We did do a CT scan on him today.  It does show that there is a lesion within the inferior pole of the right kidney which is mildly increased.Now measures 2.2 x 1.4 cm.  Otherwise, no other lesions were really noted.  There is a stable lytic lesion in the left posterior acetabulum measuring 2.9 x 1.8 cm.  He is still working.  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  Overall, his performance status is ECOG 1.    Medications:  Allergies as of 07/27/2019      Reactions   Doxycycline Other (See Comments)   Flushing of hands and face.       Medication List       Accurate as of July 27, 2019  9:56 AM. If you have any questions, ask your nurse or doctor.        AMBULATORY NON FORMULARY MEDICATION Medication Name: Overnight pulse ox. Please fax to [place]. Dx of COPD and SOB at night.    amLODipine 10 MG tablet Commonly known as: NORVASC TAKE 1 TABLET BY MOUTH DAILY   carvedilol 25 MG tablet Commonly known as: COREG Take 1 tablet (25 mg total) by mouth 2 (two) times daily.   clopidogrel 75 MG tablet Commonly known as: PLAVIX TAKE 1 TABLET BY MOUTH EVERY DAY   CONTOUR BLOOD GLUCOSE SYSTEM Devi Diagnoses - Diabetes E 11.8 Check blood sugar once daily   doxazosin 4 MG tablet Commonly known as: CARDURA Take 4 mg by mouth at bedtime.   gabapentin 300 MG capsule Commonly known as: NEURONTIN One tab PO qHS for a week, then BID for a week, then TID. May double weekly to a max of 3,673m/day. MUST SCHEDULE APPT   glucose blood test strip Commonly known as: BEstate manager/land agentDiagnoses - Diabetes E 11.8 Check blood sugar once daily   hydrALAZINE 25 MG tablet Commonly known as: APRESOLINE TAKE 1 TABLET(25 MG) BY MOUTH THREE TIMES DAILY   HYDROcodone-acetaminophen 5-325 MG tablet Commonly known as: NORCO/VICODIN Take 1 tablet by mouth every 6 (six) hours as needed.   Inlyta 5 MG tablet Generic drug: axitinib TAKE 1 TABLET (5MG) BY MOUTH TWICE DAILY FOR 21 DAYS ON, THEN 7 DAYS OFF.   levothyroxine 100  MCG tablet Commonly known as: SYNTHROID Take 1 tablet (100 mcg total) by mouth daily before breakfast.   losartan-hydrochlorothiazide 50-12.5 MG tablet Commonly known as: HYZAAR Take 1 tablet by mouth 2 (two) times daily.   metFORMIN 500 MG 24 hr tablet Commonly known as: GLUCOPHAGE-XR TAKE 2 TABLETS BY MOUTH EVERY DAY WITH BREAKFAST   mometasone 220 MCG/INH inhaler Commonly known as: ASMANEX Inhale into the lungs.   multivitamin with minerals tablet Take 1 tablet by mouth daily.   Oxycodone HCl 10 MG Tabs Take 1 tablet (10 mg total) by mouth every 6 (six) hours as needed.   predniSONE 20 MG tablet Commonly known as: DELTASONE Take 2 tablets (40 mg total) by mouth daily with breakfast.   ProAir HFA 108 (90 Base) MCG/ACT inhaler Generic drug:  albuterol INHALE 2 PUFFS BY MOUTH INTO THE LUNGS EVERY 6 HOURS AS DIRECTED AS NEEDED FOR WHEEZING   prochlorperazine 10 MG tablet Commonly known as: COMPAZINE TAKE 1 TABLET(10 MG) BY MOUTH EVERY 6 HOURS AS NEEDED FOR NAUSEA OR VOMITING   tamsulosin 0.4 MG Caps capsule Commonly known as: Flomax Take 1 capsule (0.4 mg total) by mouth 2 (two) times daily.   Trelegy Ellipta 100-62.5-25 MCG/INH Aepb Generic drug: Fluticasone-Umeclidin-Vilant Inhale 1 puff into the lungs daily.   Xtampza ER 13.5 MG C12a Generic drug: oxyCODONE ER Take 1 capsule by mouth every 12 (twelve) hours.       Allergies:  Allergies  Allergen Reactions  . Doxycycline Other (See Comments)    Flushing of hands and face.     Past Medical History, Surgical history, Social history, and Family History were reviewed and updated.  Review of Systems: Review of Systems  Constitutional: Negative.   HENT: Negative.   Eyes: Negative.   Respiratory: Negative.   Cardiovascular: Negative.   Gastrointestinal: Negative.   Genitourinary: Negative.   Musculoskeletal: Negative.   Skin: Negative.   Neurological: Negative.   Endo/Heme/Allergies: Negative.   Psychiatric/Behavioral: Negative.      Physical Exam:  weight is 155 lb 8 oz (70.5 kg). His temporal temperature is 97.1 F (36.2 C) (abnormal). His blood pressure is 157/78 (abnormal) and his pulse is 79. His respiration is 18 and oxygen saturation is 100%.   Wt Readings from Last 3 Encounters:  07/27/19 155 lb 8 oz (70.5 kg)  07/06/19 162 lb (73.5 kg)  06/26/19 157 lb (71.2 kg)    Physical Exam Vitals signs reviewed.  HENT:     Head: Normocephalic and atraumatic.  Eyes:     Pupils: Pupils are equal, round, and reactive to light.  Neck:     Musculoskeletal: Normal range of motion.  Cardiovascular:     Rate and Rhythm: Normal rate and regular rhythm.     Heart sounds: Normal heart sounds.  Pulmonary:     Effort: Pulmonary effort is normal.      Breath sounds: Normal breath sounds.  Abdominal:     General: Bowel sounds are normal.     Palpations: Abdomen is soft.     Comments: Abdominal exam shows the tic wound in the left lower quadrant.  Is erythematous.  Is slightly firm.  It probably measures about 1 cm x 1.5 cm.  There is no hepatomegaly.  There is no splenomegaly.  Musculoskeletal: Normal range of motion.        General: No tenderness or deformity.  Lymphadenopathy:     Cervical: No cervical adenopathy.  Skin:    General: Skin is warm and dry.  Findings: No erythema or rash.  Neurological:     Mental Status: He is alert and oriented to person, place, and time.  Psychiatric:        Behavior: Behavior normal.        Thought Content: Thought content normal.        Judgment: Judgment normal.       Lab Results  Component Value Date   WBC 7.6 07/27/2019   HGB 13.5 07/27/2019   HCT 42.3 07/27/2019   MCV 91.2 07/27/2019   PLT 134 (L) 07/27/2019   Lab Results  Component Value Date   FERRITIN 95 10/01/2017   Lab Results  Component Value Date   RBC 4.64 07/27/2019   Lab Results  Component Value Date   KPAFRELGTCHN 28.2 (H) 05/05/2018   LAMBDASER 24.6 05/05/2018   KAPLAMBRATIO 5.75 05/09/2018   Lab Results  Component Value Date   IGGSERUM 799 05/05/2018   IGA 229 05/05/2018   IGMSERUM 136 05/05/2018   Lab Results  Component Value Date   TOTALPROTELP 6.6 05/05/2018   ALBUMINELP 3.4 05/05/2018   A1GS 0.3 05/05/2018   A2GS 1.1 (H) 05/05/2018   BETS 0.9 05/05/2018   BETA2SER 0.4 04/29/2018   GAMS 0.8 05/05/2018   MSPIKE Not Observed 05/05/2018   SPEI  04/29/2018     Comment:     . Evaluation is consistent with an acute inflammatory  pattern. . . A faint abnormal protein band is detected in the gamma globulins that most likely represents CRP, or circulating immune complexes, rather than a monoclonal immunoglobulin. . . Immunofixation analysis is available if identification of the band(s) is  clinically indicated. .      Chemistry      Component Value Date/Time   NA 140 07/06/2019 1105   NA 140 11/25/2018 1055   K 3.8 07/06/2019 1105   CL 104 07/06/2019 1105   CO2 30 07/06/2019 1105   BUN 30 (H) 07/06/2019 1105   BUN 20 11/25/2018 1055   CREATININE 1.17 07/06/2019 1105      Component Value Date/Time   CALCIUM 9.6 07/06/2019 1105   ALKPHOS 113 06/26/2019 0940   AST 12 07/06/2019 1105   AST 17 06/26/2019 0940   ALT 9 07/06/2019 1105   ALT 25 06/26/2019 0940   BILITOT 0.6 07/06/2019 1105   BILITOT 0.5 06/26/2019 0940      Impression and Plan: [name] [name] is a very pleasant 78 yo caucasian gentleman with metastatic kidney cancer. He also has history of prostate cancer and melanoma in situ.   I am just happy that his quality life is doing so well.  He really has done nicely so far.  I really think that since the CT scan looks relatively stable, we can hold off on making any changes with his protocol.  I would like to see him back in another 4 weeks.  I want to make sure that he takes off the week of Thanksgiving.  I spent about 30 minutes with him today.  I had to look up his results from his cholecystectomy.  We went over the CT scan results.  I went over his lab report.  [name], MD 11/5/20209:56 AM

## 2019-07-27 NOTE — Patient Instructions (Signed)
[facility] Discharge Instructions for Patients Receiving Chemotherapy  Today you received the following chemotherapy agents: Keytruda   To help prevent nausea and vomiting after your treatment, we encourage you to take your nausea medication as directed    If you develop nausea and vomiting that is not controlled by your nausea medication, call the clinic.   BELOW ARE SYMPTOMS THAT SHOULD BE REPORTED IMMEDIATELY:  *FEVER GREATER THAN 100.5 F  *CHILLS WITH OR WITHOUT FEVER  NAUSEA AND VOMITING THAT IS NOT CONTROLLED WITH YOUR NAUSEA MEDICATION  *UNUSUAL SHORTNESS OF BREATH  *UNUSUAL BRUISING OR BLEEDING  TENDERNESS IN MOUTH AND THROAT WITH OR WITHOUT PRESENCE OF ULCERS  *URINARY PROBLEMS  *BOWEL PROBLEMS  UNUSUAL RASH Items with * indicate a potential emergency and should be followed up as soon as possible.  Feel free to call the clinic you have any questions or concerns. The clinic phone number is [phone].  Please show the CHEMO ALERT CARD at check-in to the Emergency Department and triage nurse.   

## 2019-08-01 MED FILL — INLYTA 5 MG TABLET: 5 | 28 days supply | Qty: 42 | Fill #1

## 2019-08-02 ENCOUNTER — Other Ambulatory Visit: Payer: Self-pay | Admitting: *Deleted

## 2019-08-02 MED ORDER — OXYCODONE HCL 10 MG PO TABS: 10.0000 mg | ORAL_TABLET | Freq: Four times a day (QID) | ORAL | 0 refills | Status: DC | PRN

## 2019-08-03 ENCOUNTER — Ambulatory Visit (INDEPENDENT_AMBULATORY_CARE_PROVIDER_SITE_OTHER): Payer: Medicare Other | Admitting: Family Medicine

## 2019-08-03 ENCOUNTER — Encounter: Payer: Self-pay | Admitting: Family Medicine

## 2019-08-03 ENCOUNTER — Other Ambulatory Visit: Payer: Self-pay

## 2019-08-03 VITALS — BP 173/60 | HR 60 | Ht 70.0 in | Wt 159.0 lb

## 2019-08-03 DIAGNOSIS — R252 Cramp and spasm: Secondary | ICD-10-CM | POA: Diagnosis not present

## 2019-08-03 DIAGNOSIS — L600 Ingrowing nail: Secondary | ICD-10-CM

## 2019-08-03 DIAGNOSIS — E119 Type 2 diabetes mellitus without complications: Secondary | ICD-10-CM | POA: Diagnosis not present

## 2019-08-03 DIAGNOSIS — Z79899 Other long term (current) drug therapy: Secondary | ICD-10-CM

## 2019-08-03 DIAGNOSIS — I10 Essential (primary) hypertension: Secondary | ICD-10-CM | POA: Diagnosis not present

## 2019-08-03 LAB — POCT GLYCOSYLATED HEMOGLOBIN (HGB A1C): Hemoglobin A1C: 6 % — AB (ref 4.0–5.6)

## 2019-08-03 MED ORDER — VITAMIN B-6 25 MG PO TABS: 25.0000 mg | ORAL_TABLET | Freq: Every day | ORAL | 1 refills | Status: AC

## 2019-08-03 NOTE — Assessment & Plan Note (Signed)
Once he is well controlled at 6.03 can try dropping his Metformin down to 1 tab daily.

## 2019-08-03 NOTE — Assessment & Plan Note (Signed)
Blood pressure not well controlled at all today.  Recommend repeat in 2 weeks.  Though he does have an appointment with one of his specialists in the next week so we can recheck pressure there as well.  He reports that he is taking his medications.

## 2019-08-03 NOTE — Progress Notes (Signed)
Established Patient Office Visit  Subjective:  Patient ID: [name] [name], male    DOB: [birth year]  Age: 78 y.o. MRN: [id number]  CC:  Chief Complaint  Patient presents with  . Nail Problem    HPI [name] [name] presents for for ingrown nail on the left great toenail.  He also complains of pain and cramping in the left lateral leg that is occurring at night.  It is bothersome enough that he has to get up and walk around and that it takes him a while to fall back asleep and then about an hour later it wakes him up again.  Diabetes - no hypoglycemic events. No wounds or sores that are not healing well. No increased thirst or urination. Checking glucose at home. Taking medications as prescribed without any side effects.  Hypertension- Pt denies chest pain, SOB, dizziness, or heart palpitations.  Taking meds as directed w/o problems.  Denies medication side effects.   He would really like to go through his medication list and really try to clean everything up.  We went through them one by one and actually really removed 7 medications off of his list.  He reports he has not actually taking the gabapentin.  He said he no longer needs the Zofran and has not been using it.    Past Medical History:  Diagnosis Date  . Allergic rhinitis   . Bigeminy   . Bradycardia   . Bruit   . CAD (coronary artery disease)   . Claudication ([place])   . Diabetes mellitus type II   . Dyspnea   . Goals of care, counseling/discussion 06/02/2018  . History of prostate cancer    Dr. Gaynelle Arabian  . Hypercholesteremia    II A  . Hypertension   . Kidney cancer, primary, with metastasis from kidney to other site, right ([place]) 06/02/2018  . Malignant neoplasm of prostate ([place])   . Microalbuminuria   . Seborrheic keratosis     Past Surgical History:  Procedure Laterality Date  . CORONARY ANGIOPLASTY WITH STENT PLACEMENT    . IR FLUORO RM 30-60 MIN  05/24/2018    Family History  Problem Relation Age of Onset  .  Heart attack Father        Acute MI  . Coronary artery disease Brother     Social History   Socioeconomic History  . Marital status: Widowed    Spouse name: Not on file  . Number of children: Not on file  . Years of education: Not on file  . Highest education level: Not on file  Occupational History  . Occupation: Retired but still works Dentist: RETIRED  Social Needs  . Financial resource strain: Not on file  . Food insecurity    Worry: Not on file    Inability: Not on file  . Transportation needs    Medical: Not on file    Non-medical: Not on file  Tobacco Use  . Smoking status: Former Smoker    Packs/day: 1.00    Years: 20.00    Pack years: 20.00    Quit date: 04/22/2003    Years since quitting: 16.2  . Smokeless tobacco: Never Used  Substance and Sexual Activity  . Alcohol use: Not Currently    Alcohol/week: 1.0 standard drinks    Types: 1 Standard drinks or equivalent per week  . Drug use: Never  . Sexual activity: Not Currently  Lifestyle  . Physical activity  Days per week: Not on file    Minutes per session: Not on file  . Stress: Not on file  Relationships  . Social Herbalist on phone: Not on file    Gets together: Not on file    Attends religious service: Not on file    Active member of club or organization: Not on file    Attends meetings of clubs or organizations: Not on file    Relationship status: Not on file  . Intimate partner violence    Fear of current or ex partner: Not on file    Emotionally abused: Not on file    Physically abused: Not on file    Forced sexual activity: Not on file  Other Topics Concern  . Not on file  Social History Narrative   Widowed 08/2009   Has 4 kids, 2 are local   Fair diet   No regular exercise    Outpatient Medications Prior to Visit  Medication Sig Dispense Refill  . amLODipine (NORVASC) 10 MG tablet TAKE 1 TABLET BY MOUTH DAILY 90 tablet 1  . carvedilol (COREG) 25 MG  tablet Take 1 tablet (25 mg total) by mouth 2 (two) times daily. 180 tablet 2  . clopidogrel (PLAVIX) 75 MG tablet TAKE 1 TABLET BY MOUTH EVERY DAY 90 tablet 3  . doxazosin (CARDURA) 4 MG tablet Take 4 mg by mouth at bedtime.    . Fluticasone-Umeclidin-Vilant (TRELEGY ELLIPTA) 100-62.5-25 MCG/INH AEPB Inhale 1 puff into the lungs daily. 1 each 11  . glucose blood (BAYER CONTOUR TEST) test strip Diagnoses - Diabetes E 11.8 Check blood sugar once daily 100 each prn  . hydrALAZINE (APRESOLINE) 25 MG tablet TAKE 1 TABLET(25 MG) BY MOUTH THREE TIMES DAILY 180 tablet 4  . INLYTA 5 MG tablet TAKE 1 TABLET (5MG ) BY MOUTH TWICE DAILY FOR 21 DAYS ON, THEN 7 DAYS OFF. 42 tablet 3  . levothyroxine (SYNTHROID) 100 MCG tablet Take 1 tablet (100 mcg total) by mouth daily before breakfast. 90 tablet 2  . losartan-hydrochlorothiazide (HYZAAR) 50-12.5 MG tablet Take 1 tablet by mouth 2 (two) times daily. 180 tablet 3  . metFORMIN (GLUCOPHAGE-XR) 500 MG 24 hr tablet TAKE 2 TABLETS BY MOUTH EVERY DAY WITH BREAKFAST (Patient taking differently: Take 500 mg by mouth daily with breakfast. ) 180 tablet 1  . Multiple Vitamins-Minerals (MULTIVITAMIN WITH MINERALS) tablet Take 1 tablet by mouth daily.    . Oxycodone HCl 10 MG TABS Take 1 tablet (10 mg total) by mouth every 6 (six) hours as needed. 90 tablet 0  . PROAIR HFA 108 (90 Base) MCG/ACT inhaler INHALE 2 PUFFS BY MOUTH INTO THE LUNGS EVERY 6 HOURS AS DIRECTED AS NEEDED FOR WHEEZING 8.5 g 2  . tamsulosin (FLOMAX) 0.4 MG CAPS capsule Take 1 capsule (0.4 mg total) by mouth 2 (two) times daily. 60 capsule 3  . XTAMPZA ER 13.5 MG C12A Take 1 capsule by mouth every 12 (twelve) hours. 60 capsule 0  . AMBULATORY NON FORMULARY MEDICATION Medication Name: Overnight pulse ox. Please fax to [place]. Dx of COPD and SOB at night. 1 vial 0  . Blood Glucose Monitoring Suppl (CONTOUR BLOOD GLUCOSE SYSTEM) DEVI Diagnoses - Diabetes E 11.8 Check blood sugar once daily 1 Device 0  .  gabapentin (NEURONTIN) 300 MG capsule One tab PO qHS for a week, then BID for a week, then TID. May double weekly to a max of 3,600mg /day. MUST SCHEDULE APPT 90 capsule 0  .  HYDROcodone-acetaminophen (NORCO/VICODIN) 5-325 MG tablet Take 1 tablet by mouth every 6 (six) hours as needed.    . mometasone (ASMANEX) 220 MCG/INH inhaler Inhale into the lungs.    . predniSONE (DELTASONE) 20 MG tablet Take 2 tablets (40 mg total) by mouth daily with breakfast. 10 tablet 0  . prochlorperazine (COMPAZINE) 10 MG tablet TAKE 1 TABLET(10 MG) BY MOUTH EVERY 6 HOURS AS NEEDED FOR NAUSEA OR VOMITING 385 tablet 1   No facility-administered medications prior to visit.     Allergies  Allergen Reactions  . Doxycycline Other (See Comments)    Flushing of hands and face.     ROS Review of Systems    Objective:    Physical Exam  Constitutional: He is oriented to person, place, and time. He appears well-developed and well-nourished.  HENT:  Head: Normocephalic and atraumatic.  Cardiovascular: Normal rate, regular rhythm and normal heart sounds.  Pulmonary/Chest: Effort normal and breath sounds normal.  Musculoskeletal:     Comments: Trace ankle edema with significant hyperpigmentation over both lower extremities.  Dorsal pedal pulses 1+.  He did not have any tenderness over the calves.  Left great toenail with no significant erythema or swelling.  Neurological: He is alert and oriented to person, place, and time.  Skin: Skin is warm and dry.  Psychiatric: He has a normal mood and affect. His behavior is normal.    BP (!) 173/60   Pulse 60   Ht 5\' 10"  (1.778 m)   Wt 159 lb (72.1 kg)   SpO2 98%   BMI 22.81 kg/m  Wt Readings from Last 3 Encounters:  08/03/19 159 lb (72.1 kg)  07/27/19 155 lb 8 oz (70.5 kg)  07/06/19 162 lb (73.5 kg)     There are no preventive care reminders to display for this patient.  There are no preventive care reminders to display for this patient.  Lab Results   Component Value Date   TSH 17.570 (H) 06/26/2019   Lab Results  Component Value Date   WBC 7.6 07/27/2019   HGB 13.5 07/27/2019   HCT 42.3 07/27/2019   MCV 91.2 07/27/2019   PLT 134 (L) 07/27/2019   Lab Results  Component Value Date   NA 138 07/27/2019   K 4.4 07/27/2019   CO2 34 (H) 07/27/2019   GLUCOSE 104 (H) 07/27/2019   BUN 35 (H) 07/27/2019   CREATININE 1.20 07/27/2019   BILITOT 0.6 07/27/2019   ALKPHOS 93 07/27/2019   AST 21 07/27/2019   ALT 21 07/27/2019   PROT 6.4 (L) 07/27/2019   ALBUMIN 3.8 07/27/2019   CALCIUM 10.3 07/27/2019   ANIONGAP 8 07/27/2019   Lab Results  Component Value Date   CHOL 89 04/14/2017   Lab Results  Component Value Date   HDL 28 (L) 04/14/2017   Lab Results  Component Value Date   LDLCALC 69 09/09/2015   Lab Results  Component Value Date   TRIG 94 04/14/2017   Lab Results  Component Value Date   CHOLHDL 3.2 04/14/2017   Lab Results  Component Value Date   HGBA1C 6.0 (A) 08/03/2019      Assessment & Plan:   Problem List Items Addressed This Visit      Cardiovascular and Mediastinum   Essential hypertension    Blood pressure not well controlled at all today.  Recommend repeat in 2 weeks.  Though he does have an appointment with one of his specialists in the next week so we can recheck  pressure there as well.  He reports that he is taking his medications.        Endocrine   DM type 2 (diabetes mellitus, type 2) ([place])    Once he is well controlled at 6.03 can try dropping his Metformin down to 1 tab daily.      Relevant Orders   POCT glycosylated hemoglobin (Hb A1C) (Completed)    Other Visit Diagnoses    Leg cramp    -  Primary   Ingrown nail of great toe of left foot       Medication management         Medication management-reviewed all of his medications and in fact we removed 7 things from his medication list.  In regard to also decrease his Metformin down to 1 tab daily since his A1c looks great.  Leg  cramps-we discussed making sure that he is stretching, hydrating well, wearing good supportive shoe wear, and can do a trial of vitamin B6 25 to 30 mg daily for couple months and see if this is helpful.  Ingrown nail overall is improved.  He still has some occasional discomfort so we did discuss at some point doing a partial nail removal but he did not want to do it today.  Meds ordered this encounter  Medications  . vitamin B-6 (PYRIDOXINE) 25 MG tablet    Sig: Take 1 tablet (25 mg total) by mouth daily. Muscle Cramping.    Dispense:  60 tablet    Refill:  1    Follow-up: Return in about 3 months (around 11/03/2019) for Diabetes follow-up.    Beatrice Lecher, MD

## 2019-08-03 NOTE — Patient Instructions (Addendum)
Recommend a trial of vitamin B6 30 mg daily.  Decrease Metformin to one tablet daily.

## 2019-08-24 ENCOUNTER — Encounter: Payer: Self-pay | Admitting: Hematology & Oncology

## 2019-08-24 ENCOUNTER — Inpatient Hospital Stay: Payer: Medicare Other

## 2019-08-24 ENCOUNTER — Inpatient Hospital Stay: Payer: Medicare Other | Attending: Hematology & Oncology | Admitting: Family

## 2019-08-24 ENCOUNTER — Other Ambulatory Visit: Payer: Self-pay

## 2019-08-24 VITALS — BP 158/80 | HR 55 | Temp 97.1°F | Resp 18 | Wt 155.0 lb

## 2019-08-24 DIAGNOSIS — E039 Hypothyroidism, unspecified: Secondary | ICD-10-CM

## 2019-08-24 DIAGNOSIS — C641 Malignant neoplasm of right kidney, except renal pelvis: Secondary | ICD-10-CM | POA: Insufficient documentation

## 2019-08-24 DIAGNOSIS — Z79899 Other long term (current) drug therapy: Secondary | ICD-10-CM | POA: Diagnosis not present

## 2019-08-24 DIAGNOSIS — C7951 Secondary malignant neoplasm of bone: Secondary | ICD-10-CM | POA: Insufficient documentation

## 2019-08-24 DIAGNOSIS — Z5112 Encounter for antineoplastic immunotherapy: Secondary | ICD-10-CM | POA: Diagnosis not present

## 2019-08-24 LAB — CMP (CANCER CENTER ONLY)
ALT: 20 U/L (ref 0–44)
AST: 23 U/L (ref 15–41)
Albumin: 3.8 g/dL (ref 3.5–5.0)
Alkaline Phosphatase: 105 U/L (ref 38–126)
Anion gap: 7 (ref 5–15)
BUN: 34 mg/dL — ABNORMAL HIGH (ref 8–23)
CO2: 28 mmol/L (ref 22–32)
Calcium: 9 mg/dL (ref 8.9–10.3)
Chloride: 106 mmol/L (ref 98–111)
Creatinine: 1.28 mg/dL — ABNORMAL HIGH (ref 0.61–1.24)
GFR, Est AFR Am: >60 mL/min (ref >60)
GFR, Estimated: 54 mL/min — ABNORMAL LOW (ref >60)
Glucose, Bld: 128 mg/dL — ABNORMAL HIGH (ref 70–99)
Potassium: 4.4 mmol/L (ref 3.5–5.1)
Sodium: 141 mmol/L (ref 135–145)
Total Bilirubin: 0.4 mg/dL (ref 0.3–1.2)
Total Protein: 6.6 g/dL (ref 6.5–8.1)

## 2019-08-24 LAB — CBC WITH DIFFERENTIAL (CANCER CENTER ONLY)
Abs Immature Granulocytes: 0.02 10*3/uL (ref 0.00–0.07)
Basophils Absolute: 0.1 10*3/uL (ref 0.0–0.1)
Basophils Relative: 1 %
Eosinophils Absolute: 1.5 10*3/uL — ABNORMAL HIGH (ref 0.0–0.5)
Eosinophils Relative: 21 %
HCT: 40.3 % (ref 39.0–52.0)
Hemoglobin: 13.1 g/dL (ref 13.0–17.0)
Immature Granulocytes: 0 %
Lymphocytes Relative: 19 %
Lymphs Abs: 1.4 10*3/uL (ref 0.7–4.0)
MCH: 29.6 pg (ref 26.0–34.0)
MCHC: 32.5 g/dL (ref 30.0–36.0)
MCV: 91.2 fL (ref 80.0–100.0)
Monocytes Absolute: 0.5 10*3/uL (ref 0.1–1.0)
Monocytes Relative: 7 %
Neutro Abs: 3.9 10*3/uL (ref 1.7–7.7)
Neutrophils Relative %: 52 %
Platelet Count: 143 10*3/uL — ABNORMAL LOW (ref 150–400)
RBC: 4.42 MIL/uL (ref 4.22–5.81)
RDW: 15.1 % (ref 11.5–15.5)
WBC Count: 7.4 10*3/uL (ref 4.0–10.5)
nRBC: 0 % (ref 0.0–0.2)

## 2019-08-24 LAB — LACTATE DEHYDROGENASE: LDH: 178 U/L (ref 98–192)

## 2019-08-24 MED ORDER — SODIUM CHLORIDE 0.9 % IV SOLN
Freq: Once | INTRAVENOUS | Status: AC
  Administered 2019-08-24: 11:00:00 via INTRAVENOUS
  Filled 2019-08-24: qty 250

## 2019-08-24 MED ORDER — AXITINIB 5 MG PO TABS: ORAL_TABLET | ORAL | 3 refills | Status: DC

## 2019-08-24 MED ORDER — SODIUM CHLORIDE 0.9 % IV SOLN
200.0000 mg | Freq: Once | INTRAVENOUS | Status: AC
  Administered 2019-08-24: 200 mg via INTRAVENOUS
  Filled 2019-08-24: qty 8

## 2019-08-24 NOTE — Patient Instructions (Signed)
[facility] Discharge Instructions for Patients Receiving Chemotherapy  Today you received the following chemotherapy agents :  Keytruda.  To help prevent nausea and vomiting after your treatment, we encourage you to take your nausea medication as prescribed.   If you develop nausea and vomiting that is not controlled by your nausea medication, call the clinic.   BELOW ARE SYMPTOMS THAT SHOULD BE REPORTED IMMEDIATELY:  *FEVER GREATER THAN 100.5 F  *CHILLS WITH OR WITHOUT FEVER  NAUSEA AND VOMITING THAT IS NOT CONTROLLED WITH YOUR NAUSEA MEDICATION  *UNUSUAL SHORTNESS OF BREATH  *UNUSUAL BRUISING OR BLEEDING  TENDERNESS IN MOUTH AND THROAT WITH OR WITHOUT PRESENCE OF ULCERS  *URINARY PROBLEMS  *BOWEL PROBLEMS  UNUSUAL RASH Items with * indicate a potential emergency and should be followed up as soon as possible.  Feel free to call the clinic should you have any questions or concerns. The clinic phone number is [phone].  Please show the CHEMO ALERT CARD at check-in to the Emergency Department and triage nurse.  

## 2019-08-24 NOTE — Progress Notes (Signed)
Hematology and Oncology Follow Up Visit  [name] [name] [id number] 09-17-1941 78 y.o. 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 He is doing well but still having the burning and aching in the left foot up to his knee at night. When he gets up and moves around the pain resolves. He states that he had an [place] in October that was negative. He plans to follow up with his vascular team.  He has occasional swelling in his feet and ankles. This comes and goes and he states that he has not noticed it recently.  No numbness or tingling in his extremities.  No falls or syncopal episodes.  He is staying busy selling rope and recently travelled to the [place] part of the state in the snow.  No fever, chills, n/v, cough, rash, dizziness, SOB, chest pain, palpitations, abdominal pain or changes in bowel or bladder habits.  He has maintained a good appetite and is staying well hydrated. His weight is stable.   ECOG Performance Status: 1 - Symptomatic but completely ambulatory  Medications:  Allergies as of 08/24/2019      Reactions   Doxycycline Other (See Comments)   Flushing of hands and face.       Medication List       Accurate as of August 24, 2019 10:12 AM. If you have any questions, ask your nurse or doctor.        amLODipine 10 MG tablet Commonly known as: NORVASC TAKE 1 TABLET BY MOUTH DAILY   carvedilol 25 MG tablet Commonly known as: COREG Take 1 tablet (25 mg total) by mouth 2 (two) times daily.   clopidogrel 75 MG tablet Commonly known as: PLAVIX TAKE 1 TABLET BY MOUTH EVERY DAY   doxazosin 4 MG tablet Commonly known as: CARDURA Take 4 mg by  mouth at bedtime.   glucose blood test strip Commonly known as: Estate manager/land agent Diagnoses - Diabetes E 11.8 [name] blood sugar once daily   hydrALAZINE 25 MG tablet Commonly known as: APRESOLINE TAKE 1 TABLET(25 MG) BY MOUTH THREE TIMES DAILY   Inlyta 5 MG tablet Generic drug: axitinib TAKE 1 TABLET (5MG) BY MOUTH TWICE DAILY FOR 21 DAYS ON, THEN 7 DAYS OFF.   levothyroxine 100 MCG tablet Commonly known as: SYNTHROID Take 1 tablet (100 mcg total) by mouth daily before breakfast.   losartan-hydrochlorothiazide 50-12.5 MG tablet Commonly known as: HYZAAR Take 1 tablet by mouth 2 (two) times daily.   metFORMIN 500 MG 24 hr tablet Commonly known as: GLUCOPHAGE-XR TAKE 2 TABLETS BY MOUTH EVERY DAY WITH BREAKFAST What changed: See the new instructions.   multivitamin with minerals tablet Take 1 tablet by mouth daily.   Oxycodone HCl 10 MG Tabs Take 1 tablet (10 mg total) by mouth every 6 (six) hours as needed.   ProAir HFA 108 (90 Base) MCG/ACT inhaler Generic drug: albuterol INHALE 2 PUFFS BY MOUTH INTO THE LUNGS EVERY 6 HOURS AS DIRECTED AS NEEDED FOR WHEEZING   tamsulosin 0.4 MG Caps capsule Commonly known as: Flomax Take 1 capsule (0.4 mg total) by mouth 2 (two) times daily.   Trelegy Ellipta 100-62.5-25 MCG/INH Aepb  Generic drug: Fluticasone-Umeclidin-Vilant Inhale 1 puff into the lungs daily.   vitamin B-6 25 MG tablet Commonly known as: pyridOXINE Take 1 tablet (25 mg total) by mouth daily. Muscle Cramping.   Xtampza ER 13.5 MG C12a Generic drug: oxyCODONE ER Take 1 capsule by mouth every 12 (twelve) hours.       Allergies:  Allergies  Allergen Reactions  . Doxycycline Other (See Comments)    Flushing of hands and face.     Past Medical History, Surgical history, Social history, and Family History were reviewed and updated.  Review of Systems: All other 10 point review of systems is negative.   Physical Exam:  weight is 155 lb (70.3 kg). His  temporal temperature is 97.1 F (36.2 C) (abnormal). His blood pressure is 158/80 (abnormal) and his pulse is 55 (abnormal). His respiration is 18 and oxygen saturation is 100%.   Wt Readings from Last 3 Encounters:  08/24/19 155 lb (70.3 kg)  08/03/19 159 lb (72.1 kg)  07/27/19 155 lb 8 oz (70.5 kg)    Ocular: Sclerae unicteric, pupils equal, round and reactive to light Ear-nose-throat: Oropharynx clear, dentition fair Lymphatic: No cervical or supraclavicular adenopathy Lungs no rales or rhonchi, good excursion bilaterally Heart regular rate and rhythm, no murmur appreciated Abd soft, nontender, positive bowel sounds, no liver or spleen tip palpated on exam, no fluid wave  MSK no focal spinal tenderness, no joint edema Neuro: non-focal, well-oriented, appropriate affect Breasts: Deferred   Lab Results  Component Value Date   WBC 7.4 08/24/2019   HGB 13.1 08/24/2019   HCT 40.3 08/24/2019   MCV 91.2 08/24/2019   PLT 143 (L) 08/24/2019   Lab Results  Component Value Date   FERRITIN 95 10/01/2017   Lab Results  Component Value Date   RBC 4.42 08/24/2019   Lab Results  Component Value Date   KPAFRELGTCHN 28.2 (H) 05/05/2018   LAMBDASER 24.6 05/05/2018   KAPLAMBRATIO 5.75 05/09/2018   Lab Results  Component Value Date   IGGSERUM 799 05/05/2018   IGA 229 05/05/2018   IGMSERUM 136 05/05/2018   Lab Results  Component Value Date   TOTALPROTELP 6.6 05/05/2018   ALBUMINELP 3.4 05/05/2018   A1GS 0.3 05/05/2018   A2GS 1.1 (H) 05/05/2018   BETS 0.9 05/05/2018   BETA2SER 0.4 04/29/2018   GAMS 0.8 05/05/2018   MSPIKE Not Observed 05/05/2018   SPEI  04/29/2018     Comment:     . Evaluation is consistent with an acute inflammatory  pattern. . . A faint abnormal protein band is detected in the gamma globulins that most likely represents CRP, or circulating immune complexes, rather than a monoclonal immunoglobulin. . . Immunofixation analysis is available if  identification of the band(s) is clinically indicated. .      Chemistry      Component Value Date/Time   NA 138 07/27/2019 0914   NA 140 11/25/2018 1055   K 4.4 07/27/2019 0914   CL 96 (L) 07/27/2019 0914   CO2 34 (H) 07/27/2019 0914   BUN 35 (H) 07/27/2019 0914   BUN 20 11/25/2018 1055   CREATININE 1.20 07/27/2019 0914   CREATININE 1.17 07/06/2019 1105      Component Value Date/Time   CALCIUM 10.3 07/27/2019 0914   ALKPHOS 93 07/27/2019 0914   AST 21 07/27/2019 0914   ALT 21 07/27/2019 0914   BILITOT 0.6 07/27/2019 0914       Impression and Plan: Mr. Lovingood is a very pleasant  78 yo caucasian gentleman with metastatic kidney cancer. He also has history of prostate cancer and melanoma in situ.  We will proceed with treatment today as planned.  Inlyta refilled per patient's request.  We will plan to see him back in another 4 weeks to get him through Christmas.  He will contact our office with any questions or concerns. We can certainly see her sooner if needed.   Laverna Peace, NP 12/3/202010:12 AM

## 2019-08-28 ENCOUNTER — Other Ambulatory Visit: Payer: Self-pay | Admitting: *Deleted

## 2019-08-28 MED ORDER — AMLODIPINE BESYLATE 10 MG PO TABS: 10.0000 mg | ORAL_TABLET | Freq: Every day | ORAL | 1 refills | Status: AC

## 2019-08-29 MED FILL — INLYTA 5 MG TABLET: 5 | 28 days supply | Qty: 42 | Fill #2

## 2019-08-31 ENCOUNTER — Other Ambulatory Visit: Payer: Self-pay | Admitting: *Deleted

## 2019-08-31 DIAGNOSIS — N139 Obstructive and reflux uropathy, unspecified: Secondary | ICD-10-CM

## 2019-08-31 MED ORDER — TAMSULOSIN HCL 0.4 MG PO CAPS: 0.4000 mg | ORAL_CAPSULE | Freq: Two times a day (BID) | ORAL | 1 refills | Status: AC

## 2019-09-07 ENCOUNTER — Other Ambulatory Visit: Payer: Self-pay | Admitting: *Deleted

## 2019-09-07 MED ORDER — OXYCODONE HCL 10 MG PO TABS: 10.0000 mg | ORAL_TABLET | Freq: Four times a day (QID) | ORAL | 0 refills | Status: DC | PRN

## 2019-09-11 ENCOUNTER — Inpatient Hospital Stay (HOSPITAL_BASED_OUTPATIENT_CLINIC_OR_DEPARTMENT_OTHER): Payer: Medicare Other | Admitting: Hematology & Oncology

## 2019-09-11 ENCOUNTER — Encounter: Payer: Self-pay | Admitting: Hematology & Oncology

## 2019-09-11 ENCOUNTER — Other Ambulatory Visit: Payer: Self-pay

## 2019-09-11 ENCOUNTER — Inpatient Hospital Stay: Payer: Medicare Other

## 2019-09-11 VITALS — BP 153/57 | HR 54 | Temp 97.1°F | Resp 18 | Wt 156.0 lb

## 2019-09-11 DIAGNOSIS — C641 Malignant neoplasm of right kidney, except renal pelvis: Secondary | ICD-10-CM

## 2019-09-11 DIAGNOSIS — C7951 Secondary malignant neoplasm of bone: Secondary | ICD-10-CM | POA: Diagnosis not present

## 2019-09-11 DIAGNOSIS — Z5112 Encounter for antineoplastic immunotherapy: Secondary | ICD-10-CM | POA: Diagnosis not present

## 2019-09-11 DIAGNOSIS — E039 Hypothyroidism, unspecified: Secondary | ICD-10-CM

## 2019-09-11 DIAGNOSIS — Z79899 Other long term (current) drug therapy: Secondary | ICD-10-CM | POA: Diagnosis not present

## 2019-09-11 LAB — CMP (CANCER CENTER ONLY)
ALT: 22 U/L (ref 0–44)
AST: 23 U/L (ref 15–41)
Albumin: 3.7 g/dL (ref 3.5–5.0)
Alkaline Phosphatase: 90 U/L (ref 38–126)
Anion gap: 11 (ref 5–15)
BUN: 34 mg/dL — ABNORMAL HIGH (ref 8–23)
CO2: 28 mmol/L (ref 22–32)
Calcium: 8.7 mg/dL — ABNORMAL LOW (ref 8.9–10.3)
Chloride: 104 mmol/L (ref 98–111)
Creatinine: 1.38 mg/dL — ABNORMAL HIGH (ref 0.61–1.24)
GFR, Est AFR Am: 57 mL/min — ABNORMAL LOW (ref >60)
GFR, Estimated: 49 mL/min — ABNORMAL LOW (ref >60)
Glucose, Bld: 128 mg/dL — ABNORMAL HIGH (ref 70–99)
Potassium: 3.6 mmol/L (ref 3.5–5.1)
Sodium: 143 mmol/L (ref 135–145)
Total Bilirubin: 0.6 mg/dL (ref 0.3–1.2)
Total Protein: 6.6 g/dL (ref 6.5–8.1)

## 2019-09-11 LAB — CBC WITH DIFFERENTIAL (CANCER CENTER ONLY)
Abs Immature Granulocytes: 0.01 10*3/uL (ref 0.00–0.07)
Basophils Absolute: 0.1 10*3/uL (ref 0.0–0.1)
Basophils Relative: 1 %
Eosinophils Absolute: 0.6 10*3/uL — ABNORMAL HIGH (ref 0.0–0.5)
Eosinophils Relative: 12 %
HCT: 39.8 % (ref 39.0–52.0)
Hemoglobin: 12.9 g/dL — ABNORMAL LOW (ref 13.0–17.0)
Immature Granulocytes: 0 %
Lymphocytes Relative: 23 %
Lymphs Abs: 1.3 10*3/uL (ref 0.7–4.0)
MCH: 29.2 pg (ref 26.0–34.0)
MCHC: 32.4 g/dL (ref 30.0–36.0)
MCV: 90 fL (ref 80.0–100.0)
Monocytes Absolute: 0.4 10*3/uL (ref 0.1–1.0)
Monocytes Relative: 8 %
Neutro Abs: 3.1 10*3/uL (ref 1.7–7.7)
Neutrophils Relative %: 56 %
Platelet Count: 202 10*3/uL (ref 150–400)
RBC: 4.42 MIL/uL (ref 4.22–5.81)
RDW: 14.6 % (ref 11.5–15.5)
WBC Count: 5.5 10*3/uL (ref 4.0–10.5)
nRBC: 0 % (ref 0.0–0.2)

## 2019-09-11 LAB — LACTATE DEHYDROGENASE: LDH: 190 U/L (ref 98–192)

## 2019-09-11 MED ORDER — SODIUM CHLORIDE 0.9 % IV SOLN
Freq: Once | INTRAVENOUS | Status: AC
  Filled 2019-09-11: qty 250

## 2019-09-11 MED ORDER — GABAPENTIN 600 MG PO TABS: 600.0000 mg | ORAL_TABLET | Freq: Every day | ORAL | 1 refills | Status: AC

## 2019-09-11 MED ORDER — SODIUM CHLORIDE 0.9 % IV SOLN
200.0000 mg | Freq: Once | INTRAVENOUS | Status: AC
  Administered 2019-09-11: 200 mg via INTRAVENOUS
  Filled 2019-09-11: qty 8

## 2019-09-11 NOTE — Patient Instructions (Signed)
Pembrolizumab injection What is this medicine? PEMBROLIZUMAB (pem broe liz ue mab) is a monoclonal antibody. It is used to treat bladder cancer, cervical cancer, endometrial cancer, esophageal cancer, head and neck cancer, hepatocellular cancer, Hodgkin lymphoma, kidney cancer, lymphoma, melanoma, Merkel cell carcinoma, lung cancer, stomach cancer, urothelial cancer, and cancers that have a certain genetic condition. This medicine may be used for other purposes; ask your health care provider or pharmacist if you have questions. COMMON BRAND NAME(S): Keytruda What should I tell my health care provider before I take this medicine? They need to know if you have any of these conditions:  diabetes  immune system problems  inflammatory bowel disease  liver disease  lung or breathing disease  lupus  received or scheduled to receive an organ transplant or a stem-cell transplant that uses donor stem cells  an unusual or allergic reaction to pembrolizumab, other medicines, foods, dyes, or preservatives  pregnant or trying to get pregnant  breast-feeding How should I use this medicine? This medicine is for infusion into a vein. It is given by a health care professional in a hospital or clinic setting. A special MedGuide will be given to you before each treatment. Be sure to read this information carefully each time. Talk to your pediatrician regarding the use of this medicine in children. While this drug may be prescribed for selected conditions, precautions do apply. Overdosage: If you think you have taken too much of this medicine contact a poison control center or emergency room at once. NOTE: This medicine is only for you. Do not share this medicine with others. What if I miss a dose? It is important not to miss your dose. Call your doctor or health care professional if you are unable to keep an appointment. What may interact with this medicine? Interactions have not been studied. Give  your health care provider a list of all the medicines, herbs, non-prescription drugs, or dietary supplements you use. Also tell them if you smoke, drink alcohol, or use illegal drugs. Some items may interact with your medicine. This list may not describe all possible interactions. Give your health care provider a list of all the medicines, herbs, non-prescription drugs, or dietary supplements you use. Also tell them if you smoke, drink alcohol, or use illegal drugs. Some items may interact with your medicine. What should I watch for while using this medicine? Your condition will be monitored carefully while you are receiving this medicine. You may need blood work done while you are taking this medicine. Do not become pregnant while taking this medicine or for 4 months after stopping it. Women should inform their doctor if they wish to become pregnant or think they might be pregnant. There is a potential for serious side effects to an unborn child. Talk to your health care professional or pharmacist for more information. Do not breast-feed an infant while taking this medicine or for 4 months after the last dose. What side effects may I notice from receiving this medicine? Side effects that you should report to your doctor or health care professional as soon as possible:  allergic reactions like skin rash, itching or hives, swelling of the face, lips, or tongue  bloody or black, tarry  breathing problems  changes in vision  chest pain  chills  confusion  constipation  cough  diarrhea  dizziness or feeling faint or lightheaded  fast or irregular heartbeat  fever  flushing  hair loss  joint pain  low blood counts - this   medicine may decrease the number of white blood cells, red blood cells and platelets. You may be at increased risk for infections and bleeding.  muscle pain  muscle weakness  persistent headache  redness, blistering, peeling or loosening of the skin,  including inside the mouth  signs and symptoms of high blood sugar such as dizziness; dry mouth; dry skin; fruity breath; nausea; stomach pain; increased hunger or thirst; increased urination  signs and symptoms of kidney injury like trouble passing urine or change in the amount of urine  signs and symptoms of liver injury like dark urine, light-colored stools, loss of appetite, nausea, right upper belly pain, yellowing of the eyes or skin  sweating  swollen lymph nodes  weight loss Side effects that usually do not require medical attention (report to your doctor or health care professional if they continue or are bothersome):  decreased appetite  muscle pain  tiredness This list may not describe all possible side effects. Call your doctor for medical advice about side effects. You may report side effects to FDA at 1-800-FDA-1088. Where should I keep my medicine? This drug is given in a hospital or clinic and will not be stored at home. NOTE: This sheet is a summary. It may not cover all possible information. If you have questions about this medicine, talk to your doctor, pharmacist, or health care provider.  2020 Elsevier/Gold Standard (2018-10-04 13:46:58)  

## 2019-09-11 NOTE — Progress Notes (Signed)
Hematology and Oncology Follow Up Visit  MASSIMO HARTLAND [id number] September 02, 1941 78 y.o. 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 Coury is here today for follow-up.  He really looks quite good.  So far, he is doing okay.  He complains a lot of his left knee.  I am unsure exactly what the problem is for this.  He says it is vascular.  I doubt that there is a vascular problem there.  I think he is seen a vascular surgeon in the past.  There has been no problems with the Inlyta.  He seems to be doing fairly well with this.  His blood pressure seems to be holding pretty steady.  There is been no problems with fever.  He has had no bleeding.  He has had no problems with bowels or bladder.  There is been no rashes.  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  He did have a nice Thanksgiving.  He plans for a quiet Christmas.  His last scans were done in November.  He probably does not need any additional scans until February.  Overall, his performance status is ECOG 1.     Medications:  Allergies as of 09/11/2019      Reactions   Doxycycline Other (See Comments)   Flushing of hands and face.       Medication List       Accurate as of September 11, 2019  1:21 PM. If you have any questions, ask your nurse or doctor.        amLODipine 10 MG tablet Commonly known as: NORVASC Take 1 tablet (10 mg total) by mouth daily.   axitinib 5 MG tablet Commonly known as: Inlyta TAKE 1 TABLET (5MG) BY MOUTH TWICE DAILY FOR 21 DAYS ON, THEN 7 DAYS OFF.   carvedilol 25 MG tablet Commonly known as: COREG Take 1 tablet (25 mg total) by mouth 2 (two) times daily.   clopidogrel 75 MG tablet Commonly  known as: PLAVIX TAKE 1 TABLET BY MOUTH EVERY DAY   doxazosin 4 MG tablet Commonly known as: CARDURA Take 4 mg by mouth at bedtime.   glucose blood test strip Commonly known as: Estate manager/land agent Diagnoses - Diabetes E 11.8 Check blood sugar once daily   hydrALAZINE 25 MG tablet Commonly known as: APRESOLINE TAKE 1 TABLET(25 MG) BY MOUTH THREE TIMES DAILY   levothyroxine 100 MCG tablet Commonly known as: SYNTHROID Take 1 tablet (100 mcg total) by mouth daily before breakfast.   losartan-hydrochlorothiazide 50-12.5 MG tablet Commonly known as: HYZAAR Take 1 tablet by mouth 2 (two) times daily.   metFORMIN 500 MG 24 hr tablet Commonly known as: GLUCOPHAGE-XR TAKE 2 TABLETS BY MOUTH EVERY DAY WITH BREAKFAST What changed: See the new instructions.   multivitamin with minerals tablet Take 1 tablet by mouth daily.   Oxycodone HCl 10 MG Tabs Take 1 tablet (10 mg total) by mouth every 6 (six) hours as needed.   ProAir HFA 108 (90 Base) MCG/ACT inhaler Generic drug: albuterol INHALE 2 PUFFS BY MOUTH INTO THE LUNGS EVERY 6 HOURS AS DIRECTED AS NEEDED FOR WHEEZING   tamsulosin 0.4 MG Caps capsule Commonly  known as: Flomax Take 1 capsule (0.4 mg total) by mouth 2 (two) times daily.   Trelegy Ellipta 100-62.5-25 MCG/INH Aepb Generic drug: Fluticasone-Umeclidin-Vilant Inhale 1 puff into the lungs daily.   vitamin B-6 25 MG tablet Commonly known as: pyridOXINE Take 1 tablet (25 mg total) by mouth daily. Muscle Cramping.   Xtampza ER 13.5 MG C12a Generic drug: oxyCODONE ER Take 1 capsule by mouth every 12 (twelve) hours.       Allergies:  Allergies  Allergen Reactions  . Doxycycline Other (See Comments)    Flushing of hands and face.     Past Medical History, Surgical history, Social history, and Family History were reviewed and updated.  Review of Systems: Review of Systems  Constitutional: Negative.   HENT: Negative.   Eyes: Negative.   Respiratory: Negative.    Cardiovascular: Negative.   Gastrointestinal: Negative.   Genitourinary: Negative.   Musculoskeletal: Negative.   Skin: Negative.   Neurological: Negative.   Endo/Heme/Allergies: Negative.   Psychiatric/Behavioral: Negative.      Physical Exam:  weight is 156 lb (70.8 kg). His temporal temperature is 97.1 F (36.2 C) (abnormal). His blood pressure is 153/57 (abnormal) and his pulse is 54 (abnormal). His respiration is 18 and oxygen saturation is 100%.   Wt Readings from Last 3 Encounters:  09/11/19 156 lb (70.8 kg)  08/24/19 155 lb (70.3 kg)  08/03/19 159 lb (72.1 kg)    Physical Exam Vitals reviewed.  HENT:     Head: Normocephalic and atraumatic.  Eyes:     Pupils: Pupils are equal, round, and reactive to light.  Cardiovascular:     Rate and Rhythm: Normal rate and regular rhythm.     Heart sounds: Normal heart sounds.  Pulmonary:     Effort: Pulmonary effort is normal.     Breath sounds: Normal breath sounds.  Abdominal:     General: Bowel sounds are normal.     Palpations: Abdomen is soft.     Comments: Abdominal exam shows a soft abdomen.  He has good bowel sounds.  There is no fluid wave.  He has a laparoscopy scars from his cholecystectomy..  There is no hepatomegaly.  There is no splenomegaly.  Musculoskeletal:        General: No tenderness or deformity. Normal range of motion.     Cervical back: Normal range of motion.  Lymphadenopathy:     Cervical: No cervical adenopathy.  Skin:    General: Skin is warm and dry.     Findings: No erythema or rash.  Neurological:     Mental Status: He is alert and oriented to person, place, and time.  Psychiatric:        Behavior: Behavior normal.        Thought Content: Thought content normal.        Judgment: Judgment normal.       Lab Results  Component Value Date   WBC 5.5 09/11/2019   HGB 12.9 (L) 09/11/2019   HCT 39.8 09/11/2019   MCV 90.0 09/11/2019   PLT 202 09/11/2019   Lab Results  Component Value  Date   FERRITIN 95 10/01/2017   Lab Results  Component Value Date   RBC 4.42 09/11/2019   Lab Results  Component Value Date   KPAFRELGTCHN 28.2 (H) 05/05/2018   LAMBDASER 24.6 05/05/2018   KAPLAMBRATIO 5.75 05/09/2018   Lab Results  Component Value Date   IGGSERUM 799 05/05/2018   IGA 229 05/05/2018   IGMSERUM 136  05/05/2018   Lab Results  Component Value Date   TOTALPROTELP 6.6 05/05/2018   ALBUMINELP 3.4 05/05/2018   A1GS 0.3 05/05/2018   A2GS 1.1 (H) 05/05/2018   BETS 0.9 05/05/2018   BETA2SER 0.4 04/29/2018   GAMS 0.8 05/05/2018   MSPIKE Not Observed 05/05/2018   SPEI  04/29/2018     Comment:     . Evaluation is consistent with an acute inflammatory  pattern. . . A faint abnormal protein band is detected in the gamma globulins that most likely represents CRP, or circulating immune complexes, rather than a monoclonal immunoglobulin. . . Immunofixation analysis is available if identification of the band(s) is clinically indicated. .      Chemistry      Component Value Date/Time   NA 143 09/11/2019 1146   NA 140 11/25/2018 1055   K 3.6 09/11/2019 1146   CL 104 09/11/2019 1146   CO2 28 09/11/2019 1146   BUN 34 (H) 09/11/2019 1146   BUN 20 11/25/2018 1055   CREATININE 1.38 (H) 09/11/2019 1146   CREATININE 1.17 07/06/2019 1105      Component Value Date/Time   CALCIUM 8.7 (L) 09/11/2019 1146   ALKPHOS 90 09/11/2019 1146   AST 23 09/11/2019 1146   ALT 22 09/11/2019 1146   BILITOT 0.6 09/11/2019 1146      Impression and Plan: Mr. Guerrero is a very pleasant 78 yo caucasian gentleman with metastatic kidney cancer. He also has history of prostate cancer and melanoma in situ.   I am just happy that his quality life is doing so well.  He really has done nicely so far.  I feel bad about this pain that he is having in the left knee.  Is on what sounds neuropathic.  Maybe, a higher dose of Neurontin would help.  I will try 600 mg at nighttime.  Again, I do  not see that we have to do another scan on him until January or February.  I know that he will have a nice Christmas.  We will plan to see him back in another 4 weeks.      [name], MD 12/21/20201:21 PM

## 2019-09-12 ENCOUNTER — Other Ambulatory Visit: Payer: Self-pay | Admitting: *Deleted

## 2019-09-12 ENCOUNTER — Ambulatory Visit (INDEPENDENT_AMBULATORY_CARE_PROVIDER_SITE_OTHER): Payer: Medicare Other

## 2019-09-12 ENCOUNTER — Encounter: Payer: Self-pay | Admitting: Sports Medicine

## 2019-09-12 ENCOUNTER — Ambulatory Visit (INDEPENDENT_AMBULATORY_CARE_PROVIDER_SITE_OTHER): Payer: Medicare Other | Admitting: Sports Medicine

## 2019-09-12 ENCOUNTER — Telehealth: Payer: Self-pay | Admitting: *Deleted

## 2019-09-12 DIAGNOSIS — M4807 Spinal stenosis, lumbosacral region: Secondary | ICD-10-CM

## 2019-09-12 DIAGNOSIS — M65332 Trigger finger, left middle finger: Secondary | ICD-10-CM | POA: Diagnosis not present

## 2019-09-12 DIAGNOSIS — M48061 Spinal stenosis, lumbar region without neurogenic claudication: Secondary | ICD-10-CM | POA: Diagnosis not present

## 2019-09-12 DIAGNOSIS — E032 Hypothyroidism due to medicaments and other exogenous substances: Secondary | ICD-10-CM

## 2019-09-12 LAB — TSH: TSH: 23.2 u[IU]/mL — ABNORMAL HIGH (ref 0.320–4.118)

## 2019-09-12 MED ORDER — LEVOTHYROXINE SODIUM 125 MCG PO TABS: 125.0000 ug | ORAL_TABLET | Freq: Every day | ORAL | 2 refills | Status: AC

## 2019-09-12 NOTE — Telephone Encounter (Signed)
As noted below by Dr. Marin Olp, I informed the patient of his thyroid level. Informed him that Dr. Marin Olp is increasing the Synthroid to 125 mcg daily. He verbalized understanding.

## 2019-09-12 NOTE — Telephone Encounter (Signed)
-----   Message from [name], MD sent at 09/12/2019 11:10 AM EST ----- Call - the thyroid is still too high. Increase the Synthroid to 188mcg.  Please call this in!!  Laurey Arrow

## 2019-09-12 NOTE — Assessment & Plan Note (Signed)
Trigger finger injection as above, home rehab given. Return to see me in 1 month.

## 2019-09-12 NOTE — Assessment & Plan Note (Signed)
History of metastatic renal cell carcinoma. We do need an MRI of his lumbar spine, ultimately we are going to be proceeding with a lumbar epidural, I think he has a left L4 distribution radiculitis from his spinal stenosis. I have advised him to take the gabapentin prescribed by his oncologist.

## 2019-09-12 NOTE — Progress Notes (Addendum)
Subjective:    CC: Multiple issues  HPI: Left leg burning: Present now for few months, he was prescribed gabapentin by his oncologist but has not yet started it.  Symptoms are moderate, persistent, they get better when he gets up from bed, moves around.  No bowel or bladder dysfunction, saddle numbness, constitutional symptoms, he has known metastatic renal clear cell carcinoma and a known vertebral compression fracture.  In addition he is having some triggering pain in his left middle finger.  Moderate, persistent, localized without radiation.  I reviewed the past medical history, family history, social history, surgical history, and allergies today and no changes were needed.  Please see the problem list section below in epic for further details.  Past Medical History: Past Medical History:  Diagnosis Date  . Allergic rhinitis   . Bigeminy   . Bradycardia   . Bruit   . CAD (coronary artery disease)   . Claudication ([place])   . Diabetes mellitus type II   . Dyspnea   . Goals of care, counseling/discussion 06/02/2018  . History of prostate cancer    Dr. Gaynelle Arabian  . Hypercholesteremia    II A  . Hypertension   . Kidney cancer, primary, with metastasis from kidney to other site, right ([place]) 06/02/2018  . Malignant neoplasm of prostate ([place])   . Microalbuminuria   . Seborrheic keratosis    Past Surgical History: Past Surgical History:  Procedure Laterality Date  . CORONARY ANGIOPLASTY WITH STENT PLACEMENT    . IR FLUORO RM 30-60 MIN  05/24/2018   Social History: Social History   Socioeconomic History  . Marital status: Widowed    Spouse name: Not on file  . Number of children: Not on file  . Years of education: Not on file  . Highest education level: Not on file  Occupational History  . Occupation: Retired but still works Dentist: RETIRED  Tobacco Use  . Smoking status: Former Smoker    Packs/day: 1.00    Years: 20.00    Pack years: 20.00    Quit  date: 04/22/2003    Years since quitting: 16.4  . Smokeless tobacco: Never Used  Substance and Sexual Activity  . Alcohol use: Not Currently    Alcohol/week: 1.0 standard drinks    Types: 1 Standard drinks or equivalent per week  . Drug use: Never  . Sexual activity: Not Currently  Other Topics Concern  . Not on file  Social History Narrative   Widowed 08/2009   Has 4 kids, 2 are local   Fair diet   No regular exercise   Social Determinants of Health   Financial Resource Strain:   . Difficulty of Paying Living Expenses: Not on file  Food Insecurity:   . Worried About Charity fundraiser in the Last Year: Not on file  . Ran Out of Food in the Last Year: Not on file  Transportation Needs:   . Lack of Transportation (Medical): Not on file  . Lack of Transportation (Non-Medical): Not on file  Physical Activity:   . Days of Exercise per Week: Not on file  . Minutes of Exercise per Session: Not on file  Stress:   . Feeling of Stress : Not on file  Social Connections:   . Frequency of Communication with Friends and Family: Not on file  . Frequency of Social Gatherings with Friends and Family: Not on file  . Attends Religious Services: Not on file  .  Active Member of Clubs or Organizations: Not on file  . Attends Archivist Meetings: Not on file  . Marital Status: Not on file   Family History: Family History  Problem Relation Age of Onset  . Heart attack Father        Acute MI  . Coronary artery disease Brother    Allergies: Allergies  Allergen Reactions  . Doxycycline Other (See Comments)    Flushing of hands and face.    Medications: See med rec.  Review of Systems: No fevers, chills, night sweats, weight loss, chest pain, or shortness of breath.   Objective:    General: Well Developed, well nourished, and in no acute distress.  Neuro: Alert and oriented x3, extra-ocular muscles intact, sensation grossly intact.  HEENT: Normocephalic, atraumatic, pupils  equal round reactive to light, neck supple, no masses, no lymphadenopathy, thyroid nonpalpable.  Skin: Warm and dry, no rashes. Cardiac: Regular rate and rhythm, no murmurs rubs or gallops, no lower extremity edema.  Dorsalis pedis and posterior tibial pulses are intact and easily distinguished in both lower extremities. Respiratory: Clear to auscultation bilaterally. Not using accessory muscles, speaking in full sentences. Left hand: Palpable flexor tendon nodule in close approximation to the third digit.  Procedure: Real-time Ultrasound Guided injection of the left middle Device: Samsung HS60  Verbal informed consent obtained.  Time-out conducted.  Noted no overlying erythema, induration, or other signs of local infection.  Skin prepped in a sterile fashion.  Local anesthesia: Topical Ethyl chloride.  With sterile technique and under real time ultrasound guidance:  1/2 cc Kenalog 40, 1/2 cc lidocaine injected easily completed without difficulty  Pain immediately resolved suggesting accurate placement of the medication.  Advised to call if fevers/chills, erythema, induration, drainage, or persistent bleeding.  Images permanently stored and available for review in the ultrasound unit.  Impression: Technically successful ultrasound guided injection.  CT of the abdomen and pelvis reviewed, he has significant lumbar spinal stenosis, multifactorial from degenerative disc disease, ligamentum flavum hypertrophy and facet arthritis in his lumbar spine at the L4-L5 level.  Impression and Recommendations:    Trigger finger, left middle finger Trigger finger injection as above, home rehab given. Return to see me in 1 month.  Lumbar spinal stenosis History of metastatic renal cell carcinoma. We do need an MRI of his lumbar spine, ultimately we are going to be proceeding with a lumbar epidural, I think he has a left L4 distribution radiculitis from his spinal stenosis. I have advised him to take  the gabapentin prescribed by his oncologist.   ___________________________________________ Gwen Her. Dianah Field, M.D., ABFM., CAQSM. Primary Care and Sports Medicine [facility]  Adjunct Professor of [place] [employer] [facility] [employer] of Medicine

## 2019-09-14 ENCOUNTER — Other Ambulatory Visit: Payer: Medicare Other

## 2019-09-14 ENCOUNTER — Ambulatory Visit: Payer: Medicare Other | Admitting: Hematology & Oncology

## 2019-09-14 ENCOUNTER — Encounter: Payer: Self-pay | Admitting: *Deleted

## 2019-09-14 ENCOUNTER — Ambulatory Visit: Payer: Medicare Other

## 2019-09-25 ENCOUNTER — Telehealth: Payer: Self-pay | Admitting: Pharmacy Technician

## 2019-09-25 MED FILL — INLYTA 5 MG TABLET: 5 | 28 days supply | Qty: 42 | Fill #3

## 2019-09-25 NOTE — Telephone Encounter (Signed)
Oral Oncology Patient Advocate Encounter   Was successful in securing patient a $7000 grant from [place] to provide copayment coverage for Inlyta.  This will keep the out of pocket expense at $0.    The billing information is as follows and has been shared with [place].   Member ID: [id number] Group ID: [id number] RxBin: [id number] PCN: [id number] Dates of Eligibility: 09/25/2019 through 09/24/2020  Fund name:  Renal Cell ([place])  [place] Patient [place] Phone [phone] Fax [phone] 09/25/2019 8:42 AM

## 2019-09-29 ENCOUNTER — Telehealth: Payer: Self-pay | Admitting: Pharmacy Technician

## 2019-09-29 NOTE — Telephone Encounter (Signed)
Oral Oncology Patient Advocate Encounter   Was successful in securing patient a second grant of $5,900 grant from Patient [facility]) to provide copayment coverage for Inlyta.  This will keep the out of pocket expense at $0.     I have spoken with the patient.    The billing information is as follows and has been shared with [name].   Member ID: [id number] Group ID: [id number] RxBin: [id number] Dates of Eligibility: 10/07/2018 through 01/04/2020  Fund:  Renal Cell [place] Patient [name] [name] Phone [phone] Fax [phone] 09/29/2019 1:57 PM

## 2019-10-02 ENCOUNTER — Inpatient Hospital Stay: Payer: Medicare Other | Attending: Hematology & Oncology

## 2019-10-02 ENCOUNTER — Encounter: Payer: Self-pay | Admitting: Hematology & Oncology

## 2019-10-02 ENCOUNTER — Other Ambulatory Visit: Payer: Self-pay

## 2019-10-02 ENCOUNTER — Inpatient Hospital Stay (HOSPITAL_BASED_OUTPATIENT_CLINIC_OR_DEPARTMENT_OTHER): Payer: Medicare Other | Admitting: Hematology & Oncology

## 2019-10-02 ENCOUNTER — Telehealth: Payer: Self-pay | Admitting: Hematology & Oncology

## 2019-10-02 ENCOUNTER — Inpatient Hospital Stay: Payer: Medicare Other

## 2019-10-02 VITALS — BP 165/73 | HR 59 | Temp 97.7°F | Resp 19 | Wt 166.0 lb

## 2019-10-02 DIAGNOSIS — Z5111 Encounter for antineoplastic chemotherapy: Secondary | ICD-10-CM | POA: Diagnosis not present

## 2019-10-02 DIAGNOSIS — C641 Malignant neoplasm of right kidney, except renal pelvis: Secondary | ICD-10-CM | POA: Insufficient documentation

## 2019-10-02 DIAGNOSIS — Z5112 Encounter for antineoplastic immunotherapy: Secondary | ICD-10-CM | POA: Diagnosis present

## 2019-10-02 DIAGNOSIS — C7951 Secondary malignant neoplasm of bone: Secondary | ICD-10-CM | POA: Diagnosis not present

## 2019-10-02 LAB — CBC WITH DIFFERENTIAL (CANCER CENTER ONLY)
Abs Immature Granulocytes: 0.02 10*3/uL (ref 0.00–0.07)
Basophils Absolute: 0 10*3/uL (ref 0.0–0.1)
Basophils Relative: 1 %
Eosinophils Absolute: 0.2 10*3/uL (ref 0.0–0.5)
Eosinophils Relative: 3 %
HCT: 37.6 % — ABNORMAL LOW (ref 39.0–52.0)
Hemoglobin: 11.8 g/dL — ABNORMAL LOW (ref 13.0–17.0)
Immature Granulocytes: 0 %
Lymphocytes Relative: 15 %
Lymphs Abs: 0.9 10*3/uL (ref 0.7–4.0)
MCH: 28.7 pg (ref 26.0–34.0)
MCHC: 31.4 g/dL (ref 30.0–36.0)
MCV: 91.5 fL (ref 80.0–100.0)
Monocytes Absolute: 0.5 10*3/uL (ref 0.1–1.0)
Monocytes Relative: 9 %
Neutro Abs: 4.1 10*3/uL (ref 1.7–7.7)
Neutrophils Relative %: 72 %
Platelet Count: 181 10*3/uL (ref 150–400)
RBC: 4.11 MIL/uL — ABNORMAL LOW (ref 4.22–5.81)
RDW: 15.2 % (ref 11.5–15.5)
WBC Count: 5.6 10*3/uL (ref 4.0–10.5)
nRBC: 0 % (ref 0.0–0.2)

## 2019-10-02 LAB — CMP (CANCER CENTER ONLY)
ALT: 20 U/L (ref 0–44)
AST: 18 U/L (ref 15–41)
Albumin: 3.4 g/dL — ABNORMAL LOW (ref 3.5–5.0)
Alkaline Phosphatase: 79 U/L (ref 38–126)
Anion gap: 11 (ref 5–15)
BUN: 36 mg/dL — ABNORMAL HIGH (ref 8–23)
CO2: 25 mmol/L (ref 22–32)
Calcium: 9.2 mg/dL (ref 8.9–10.3)
Chloride: 108 mmol/L (ref 98–111)
Creatinine: 1.63 mg/dL — ABNORMAL HIGH (ref 0.61–1.24)
GFR, Est AFR Am: 46 mL/min — ABNORMAL LOW (ref >60)
GFR, Estimated: 40 mL/min — ABNORMAL LOW (ref >60)
Glucose, Bld: 179 mg/dL — ABNORMAL HIGH (ref 70–99)
Potassium: 4.5 mmol/L (ref 3.5–5.1)
Sodium: 144 mmol/L (ref 135–145)
Total Bilirubin: 0.5 mg/dL (ref 0.3–1.2)
Total Protein: 6.1 g/dL — ABNORMAL LOW (ref 6.5–8.1)

## 2019-10-02 LAB — LACTATE DEHYDROGENASE: LDH: 213 U/L — ABNORMAL HIGH (ref 98–192)

## 2019-10-02 MED ORDER — SODIUM CHLORIDE 0.9 % IV SOLN
200.0000 mg | Freq: Once | INTRAVENOUS | Status: AC
  Administered 2019-10-02: 200 mg via INTRAVENOUS
  Filled 2019-10-02: qty 8

## 2019-10-02 MED ORDER — SODIUM CHLORIDE 0.9 % IV SOLN
Freq: Once | INTRAVENOUS | Status: AC
  Filled 2019-10-02: qty 250

## 2019-10-02 MED ORDER — DENOSUMAB 120 MG/1.7ML ~~LOC~~ SOLN
SUBCUTANEOUS | Status: AC
  Filled 2019-10-02: qty 1.7

## 2019-10-02 MED ORDER — DENOSUMAB 120 MG/1.7ML ~~LOC~~ SOLN
120.0000 mg | Freq: Once | SUBCUTANEOUS | Status: AC
  Administered 2019-10-02: 120 mg via SUBCUTANEOUS

## 2019-10-02 NOTE — Telephone Encounter (Signed)
Appointments scheduled.  Per Verbal from Dr Marin Olp it is OK to move patient out to q4wks instead of current treatment plan of q3wks.  The patient was requesting this change.  Per 1/11 los

## 2019-10-02 NOTE — Progress Notes (Signed)
Hematology and Oncology Follow Up Visit  [name] [name] [id number] 1941-08-04 79 y.o. 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  He really looks quite good.  So far, he is doing okay.  He complains a lot of his left knee.  I am unsure exactly what the problem is for this.  He says it is vascular.  I doubt that there is a vascular problem there.  I think he has seen a vascular surgeon in the past.  His main complaint has been urinary frequency.  This does seem to have been during the daytime.  At nighttime, he really does not have any issues.  He has seen urology before.  He has not seen them for a while.  We will have to make a referral back to urology to see if they can help with this urinary frequency issue.  There is been no problems with nausea or vomiting.  He has had no bleeding.  He has had no fever.  He has had no cough.  He is still working.  He sells rope.  He is about to go out to [place] and try to sell some more rope.  Overall, his performance status is ECOG 1.    Medications:  Allergies as of 10/02/2019      Reactions   Doxycycline Other (See Comments)   Flushing of hands and face.       Medication List       Accurate as of October 02, 2019 12:21 PM. If you have any questions, ask your nurse or doctor.        amLODipine 10 MG tablet Commonly known as: NORVASC Take 1 tablet (10 mg total) by mouth daily.   axitinib 5 MG tablet Commonly known as: Inlyta TAKE 1 TABLET (5MG) BY MOUTH TWICE DAILY FOR 21 DAYS ON, THEN 7 DAYS OFF.   carvedilol 25 MG tablet Commonly known as: COREG Take 1 tablet (25 mg total) by mouth 2 (two) times daily.   clopidogrel  75 MG tablet Commonly known as: PLAVIX TAKE 1 TABLET BY MOUTH EVERY DAY   doxazosin 4 MG tablet Commonly known as: CARDURA Take 4 mg by mouth at bedtime.   gabapentin 600 MG tablet Commonly known as: Neurontin Take 1 tablet (600 mg total) by mouth at bedtime.   glucose blood test strip Commonly known as: Estate manager/land agent Diagnoses - Diabetes E 11.8 Check blood sugar once daily   hydrALAZINE 25 MG tablet Commonly known as: APRESOLINE TAKE 1 TABLET(25 MG) BY MOUTH THREE TIMES DAILY   levothyroxine 125 MCG tablet Commonly known as: SYNTHROID Take 1 tablet (125 mcg total) by mouth daily before breakfast.   losartan-hydrochlorothiazide 50-12.5 MG tablet Commonly known as: HYZAAR Take 1 tablet by mouth 2 (two) times daily.   metFORMIN 500 MG 24 hr tablet Commonly known as: GLUCOPHAGE-XR TAKE 2 TABLETS BY MOUTH EVERY DAY WITH BREAKFAST What changed: See the new instructions.   multivitamin with minerals tablet Take 1 tablet by mouth daily.   Oxycodone HCl 10 MG Tabs Take 1 tablet (10 mg total) by mouth every 6 (six) hours as needed.   ProAir HFA 108 (90  Base) MCG/ACT inhaler Generic drug: albuterol INHALE 2 PUFFS BY MOUTH INTO THE LUNGS EVERY 6 HOURS AS DIRECTED AS NEEDED FOR WHEEZING   tamsulosin 0.4 MG Caps capsule Commonly known as: Flomax Take 1 capsule (0.4 mg total) by mouth 2 (two) times daily.   Trelegy Ellipta 100-62.5-25 MCG/INH Aepb Generic drug: Fluticasone-Umeclidin-Vilant Inhale 1 puff into the lungs daily.   vitamin B-6 25 MG tablet Commonly known as: pyridOXINE Take 1 tablet (25 mg total) by mouth daily. Muscle Cramping.   Xtampza ER 13.5 MG C12a Generic drug: oxyCODONE ER Take 1 capsule by mouth every 12 (twelve) hours.       Allergies:  Allergies  Allergen Reactions  . Doxycycline Other (See Comments)    Flushing of hands and face.     Past Medical History, Surgical history, Social history, and Family History were reviewed and updated.   Review of Systems: Review of Systems  Constitutional: Negative.   HENT: Negative.   Eyes: Negative.   Respiratory: Negative.   Cardiovascular: Negative.   Gastrointestinal: Negative.   Genitourinary: Negative.   Musculoskeletal: Negative.   Skin: Negative.   Neurological: Negative.   Endo/Heme/Allergies: Negative.   Psychiatric/Behavioral: Negative.      Physical Exam:  weight is 166 lb (75.3 kg). His temporal temperature is 97.7 F (36.5 C). His blood pressure is 165/73 (abnormal) and his pulse is 59 (abnormal). His respiration is 19 and oxygen saturation is 95%.   Wt Readings from Last 3 Encounters:  10/02/19 166 lb (75.3 kg)  09/12/19 159 lb (72.1 kg)  09/11/19 156 lb (70.8 kg)    Physical Exam Vitals reviewed.  HENT:     Head: Normocephalic and atraumatic.  Eyes:     Pupils: Pupils are equal, round, and reactive to light.  Cardiovascular:     Rate and Rhythm: Normal rate and regular rhythm.     Heart sounds: Normal heart sounds.  Pulmonary:     Effort: Pulmonary effort is normal.     Breath sounds: Normal breath sounds.  Abdominal:     General: Bowel sounds are normal.     Palpations: Abdomen is soft.     Comments: Abdominal exam shows a soft abdomen.  He has good bowel sounds.  There is no fluid wave.  He has a laparoscopy scars from his cholecystectomy..  There is no hepatomegaly.  There is no splenomegaly.  Musculoskeletal:        General: No tenderness or deformity. Normal range of motion.     Cervical back: Normal range of motion.  Lymphadenopathy:     Cervical: No cervical adenopathy.  Skin:    General: Skin is warm and dry.     Findings: No erythema or rash.  Neurological:     Mental Status: He is alert and oriented to person, place, and time.  Psychiatric:        Behavior: Behavior normal.        Thought Content: Thought content normal.        Judgment: Judgment normal.       Lab Results  Component Value Date   WBC 5.6 10/02/2019   HGB  11.8 (L) 10/02/2019   HCT 37.6 (L) 10/02/2019   MCV 91.5 10/02/2019   PLT 181 10/02/2019   Lab Results  Component Value Date   FERRITIN 95 10/01/2017   Lab Results  Component Value Date   RBC 4.11 (L) 10/02/2019   Lab Results  Component Value Date   KPAFRELGTCHN 28.2 (H) 05/05/2018  LAMBDASER 24.6 05/05/2018   KAPLAMBRATIO 5.75 05/09/2018   Lab Results  Component Value Date   IGGSERUM 799 05/05/2018   IGA 229 05/05/2018   IGMSERUM 136 05/05/2018   Lab Results  Component Value Date   TOTALPROTELP 6.6 05/05/2018   ALBUMINELP 3.4 05/05/2018   A1GS 0.3 05/05/2018   A2GS 1.1 (H) 05/05/2018   BETS 0.9 05/05/2018   BETA2SER 0.4 04/29/2018   GAMS 0.8 05/05/2018   MSPIKE Not Observed 05/05/2018   SPEI  04/29/2018     Comment:     . Evaluation is consistent with an acute inflammatory  pattern. . . A faint abnormal protein band is detected in the gamma globulins that most likely represents CRP, or circulating immune complexes, rather than a monoclonal immunoglobulin. . . Immunofixation analysis is available if identification of the band(s) is clinically indicated. .      Chemistry      Component Value Date/Time   NA 144 10/02/2019 1034   NA 140 11/25/2018 1055   K 4.5 10/02/2019 1034   CL 108 10/02/2019 1034   CO2 25 10/02/2019 1034   BUN 36 (H) 10/02/2019 1034   BUN 20 11/25/2018 1055   CREATININE 1.63 (H) 10/02/2019 1034   CREATININE 1.17 07/06/2019 1105      Component Value Date/Time   CALCIUM 9.2 10/02/2019 1034   ALKPHOS 79 10/02/2019 1034   AST 18 10/02/2019 1034   ALT 20 10/02/2019 1034   BILITOT 0.5 10/02/2019 1034      Impression and Plan: [name] [name] is a very pleasant 79 yo caucasian gentleman with metastatic kidney cancer. He also has history of prostate cancer and melanoma in situ.   We will have to see about making the urology referral.  I will see if Dr. Diona Fanti will see him.  I am sure that it has some to do with his past prostate  cancer therapy.  We will not have to do another scan on him for the kidney cancer until February.  I will plan to see him back in another 4 weeks.     [name], MD 1/11/202112:21 PM

## 2019-10-02 NOTE — Progress Notes (Signed)
Per Dr. Ennever, okay to treat despite labs 

## 2019-10-02 NOTE — Patient Instructions (Signed)
Denosumab injection What is this medicine? DENOSUMAB (den oh sue mab) slows bone breakdown. Prolia is used to treat osteoporosis in women after menopause and in men, and in people who are taking corticosteroids for 6 months or more. Xgeva is used to treat a high calcium level due to cancer and to prevent bone fractures and other bone problems caused by multiple myeloma or cancer bone metastases. Xgeva is also used to treat giant cell tumor of the bone. This medicine may be used for other purposes; ask your health care provider or pharmacist if you have questions. COMMON BRAND NAME(S): Prolia, XGEVA What should I tell my health care provider before I take this medicine? They need to know if you have any of these conditions:  dental disease  having surgery or tooth extraction  infection  kidney disease  low levels of calcium or Vitamin D in the blood  malnutrition  on hemodialysis  skin conditions or sensitivity  thyroid or parathyroid disease  an unusual reaction to denosumab, other medicines, foods, dyes, or preservatives  pregnant or trying to get pregnant  breast-feeding How should I use this medicine? This medicine is for injection under the skin. It is given by a health care professional in a hospital or clinic setting. A special MedGuide will be given to you before each treatment. Be sure to read this information carefully each time. For Prolia, talk to your pediatrician regarding the use of this medicine in children. Special care may be needed. For Xgeva, talk to your pediatrician regarding the use of this medicine in children. While this drug may be prescribed for children as young as 13 years for selected conditions, precautions do apply. Overdosage: If you think you have taken too much of this medicine contact a poison control center or emergency room at once. NOTE: This medicine is only for you. Do not share this medicine with others. What if I miss a dose? It is  important not to miss your dose. Call your doctor or health care professional if you are unable to keep an appointment. What may interact with this medicine? Do not take this medicine with any of the following medications:  other medicines containing denosumab This medicine may also interact with the following medications:  medicines that lower your chance of fighting infection  steroid medicines like prednisone or cortisone This list may not describe all possible interactions. Give your health care provider a list of all the medicines, herbs, non-prescription drugs, or dietary supplements you use. Also tell them if you smoke, drink alcohol, or use illegal drugs. Some items may interact with your medicine. What should I watch for while using this medicine? Visit your doctor or health care professional for regular checks on your progress. Your doctor or health care professional may order blood tests and other tests to see how you are doing. Call your doctor or health care professional for advice if you get a fever, chills or sore throat, or other symptoms of a cold or flu. Do not treat yourself. This drug may decrease your body's ability to fight infection. Try to avoid being around people who are sick. You should make sure you get enough calcium and vitamin D while you are taking this medicine, unless your doctor tells you not to. Discuss the foods you eat and the vitamins you take with your health care professional. See your dentist regularly. Brush and floss your teeth as directed. Before you have any dental work done, tell your dentist you are   receiving this medicine. Do not become pregnant while taking this medicine or for 5 months after stopping it. Talk with your doctor or health care professional about your birth control options while taking this medicine. Women should inform their doctor if they wish to become pregnant or think they might be pregnant. There is a potential for serious side  effects to an unborn child. Talk to your health care professional or pharmacist for more information. What side effects may I notice from receiving this medicine? Side effects that you should report to your doctor or health care professional as soon as possible:  allergic reactions like skin rash, itching or hives, swelling of the face, lips, or tongue  bone pain  breathing problems  dizziness  jaw pain, especially after dental work  redness, blistering, peeling of the skin  signs and symptoms of infection like fever or chills; cough; sore throat; pain or trouble passing urine  signs of low calcium like fast heartbeat, muscle cramps or muscle pain; pain, tingling, numbness in the hands or feet; seizures  unusual bleeding or bruising  unusually weak or tired Side effects that usually do not require medical attention (report to your doctor or health care professional if they continue or are bothersome):  constipation  diarrhea  headache  joint pain  loss of appetite  muscle pain  runny nose  tiredness  upset stomach This list may not describe all possible side effects. Call your doctor for medical advice about side effects. You may report side effects to FDA at 1-800-FDA-1088. Where should I keep my medicine? This medicine is only given in a clinic, doctor's office, or other health care setting and will not be stored at home. NOTE: This sheet is a summary. It may not cover all possible information. If you have questions about this medicine, talk to your doctor, pharmacist, or health care provider.  2020 Elsevier/Gold Standard (2018-01-14 16:10:44) Pembrolizumab injection What is this medicine? PEMBROLIZUMAB (pem broe liz ue mab) is a monoclonal antibody. It is used to treat certain types of cancer. This medicine may be used for other purposes; ask your health care provider or pharmacist if you have questions. COMMON BRAND NAME(S): Keytruda What should I tell my  health care provider before I take this medicine? They need to know if you have any of these conditions:  diabetes  immune system problems  inflammatory bowel disease  liver disease  lung or breathing disease  lupus  received or scheduled to receive an organ transplant or a stem-cell transplant that uses donor stem cells  an unusual or allergic reaction to pembrolizumab, other medicines, foods, dyes, or preservatives  pregnant or trying to get pregnant  breast-feeding How should I use this medicine? This medicine is for infusion into a vein. It is given by a health care professional in a hospital or clinic setting. A special MedGuide will be given to you before each treatment. Be sure to read this information carefully each time. Talk to your pediatrician regarding the use of this medicine in children. While this drug may be prescribed for children as young as 6 months for selected conditions, precautions do apply. Overdosage: If you think you have taken too much of this medicine contact a poison control center or emergency room at once. NOTE: This medicine is only for you. Do not share this medicine with others. What if I miss a dose? It is important not to miss your dose. Call your doctor or health care professional if you  are unable to keep an appointment. What may interact with this medicine? Interactions have not been studied. Give your health care provider a list of all the medicines, herbs, non-prescription drugs, or dietary supplements you use. Also tell them if you smoke, drink alcohol, or use illegal drugs. Some items may interact with your medicine. This list may not describe all possible interactions. Give your health care provider a list of all the medicines, herbs, non-prescription drugs, or dietary supplements you use. Also tell them if you smoke, drink alcohol, or use illegal drugs. Some items may interact with your medicine. What should I watch for while using this  medicine? Your condition will be monitored carefully while you are receiving this medicine. You may need blood work done while you are taking this medicine. Do not become pregnant while taking this medicine or for 4 months after stopping it. Women should inform their doctor if they wish to become pregnant or think they might be pregnant. There is a potential for serious side effects to an unborn child. Talk to your health care professional or pharmacist for more information. Do not breast-feed an infant while taking this medicine or for 4 months after the last dose. What side effects may I notice from receiving this medicine? Side effects that you should report to your doctor or health care professional as soon as possible:  allergic reactions like skin rash, itching or hives, swelling of the face, lips, or tongue  bloody or black, tarry  breathing problems  changes in vision  chest pain  chills  confusion  constipation  cough  diarrhea  dizziness or feeling faint or lightheaded  fast or irregular heartbeat  fever  flushing  joint pain  low blood counts - this medicine may decrease the number of white blood cells, red blood cells and platelets. You may be at increased risk for infections and bleeding.  muscle pain  muscle weakness  pain, tingling, numbness in the hands or feet  persistent headache  redness, blistering, peeling or loosening of the skin, including inside the mouth  signs and symptoms of high blood sugar such as dizziness; dry mouth; dry skin; fruity breath; nausea; stomach pain; increased hunger or thirst; increased urination  signs and symptoms of kidney injury like trouble passing urine or change in the amount of urine  signs and symptoms of liver injury like dark urine, light-colored stools, loss of appetite, nausea, right upper belly pain, yellowing of the eyes or skin  sweating  swollen lymph nodes  weight loss Side effects that usually  do not require medical attention (report to your doctor or health care professional if they continue or are bothersome):  decreased appetite  hair loss  muscle pain  tiredness This list may not describe all possible side effects. Call your doctor for medical advice about side effects. You may report side effects to FDA at 1-800-FDA-1088. Where should I keep my medicine? This drug is given in a hospital or clinic and will not be stored at home. NOTE: This sheet is a summary. It may not cover all possible information. If you have questions about this medicine, talk to your doctor, pharmacist, or health care provider.  2020 Elsevier/Gold Standard (2019-07-14 18:07:58)

## 2019-10-03 ENCOUNTER — Ambulatory Visit: Payer: Medicare Other | Admitting: Hematology & Oncology

## 2019-10-03 ENCOUNTER — Other Ambulatory Visit: Payer: Medicare Other

## 2019-10-03 ENCOUNTER — Ambulatory Visit: Payer: Medicare Other

## 2019-10-05 ENCOUNTER — Other Ambulatory Visit: Payer: Self-pay | Admitting: Family Medicine

## 2019-10-06 ENCOUNTER — Ambulatory Visit (INDEPENDENT_AMBULATORY_CARE_PROVIDER_SITE_OTHER): Payer: Medicare Other | Admitting: Sports Medicine

## 2019-10-06 ENCOUNTER — Other Ambulatory Visit: Payer: Self-pay

## 2019-10-06 DIAGNOSIS — M48061 Spinal stenosis, lumbar region without neurogenic claudication: Secondary | ICD-10-CM | POA: Diagnosis not present

## 2019-10-06 NOTE — Assessment & Plan Note (Addendum)
[name] [name] returns, he has L4-L5 lumbar spinal stenosis, left-sided L4 distribution radiculitis. MRI showed L4-L5 changes, impingement of the left and right L4 nerve roots. He has started gabapentin 600 mg at night and reports that his pain is nearly completely gone. He does have some itching in his mid upper back, on exam he simply has some dry skin. He will apply emollients twice a day, and follow this up with his PCP. He can return to see me as needed for his back pain, neck step would be a bilateral L4-L5 transforaminal epidural.

## 2019-10-06 NOTE — Progress Notes (Signed)
    Procedures performed today:    None.  Independent interpretation of tests performed by another provider:   None.  Impression and Recommendations:    Lumbar spinal stenosis [name] [name] returns, he has L4-L5 lumbar spinal stenosis, left-sided L4 distribution radiculitis. MRI showed L4-L5 changes, impingement of the left and right L4 nerve roots. He has started gabapentin 600 mg at night and reports that his pain is nearly completely gone. He does have some itching in his mid upper back, on exam he simply has some dry skin. He will apply emollients twice a day, and follow this up with his PCP. He can return to see me as needed for his back pain, neck step would be a bilateral L4-L5 transforaminal epidural.    ___________________________________________ Gwen Her. Dianah Field, M.D., ABFM., CAQSM. Primary Care and [place] Instructor of [employer] [facility]

## 2019-10-20 ENCOUNTER — Other Ambulatory Visit: Payer: Self-pay | Admitting: *Deleted

## 2019-10-20 ENCOUNTER — Other Ambulatory Visit: Payer: Self-pay | Admitting: Hematology & Oncology

## 2019-10-20 DIAGNOSIS — C641 Malignant neoplasm of right kidney, except renal pelvis: Secondary | ICD-10-CM

## 2019-10-20 MED ORDER — OXYCODONE HCL 10 MG PO TABS: 10.0000 mg | ORAL_TABLET | Freq: Four times a day (QID) | ORAL | 0 refills | Status: AC | PRN

## 2019-10-23 ENCOUNTER — Ambulatory Visit: Payer: Medicare Other

## 2019-10-23 ENCOUNTER — Other Ambulatory Visit: Payer: Medicare Other

## 2019-10-23 ENCOUNTER — Ambulatory Visit: Payer: Medicare Other | Admitting: Hematology & Oncology

## 2019-10-23 MED FILL — INLYTA 5 MG TABLET: 5 | 28 days supply | Qty: 42 | Fill #0

## 2019-10-26 DIAGNOSIS — R52 Pain, unspecified: Secondary | ICD-10-CM | POA: Diagnosis not present

## 2019-10-26 DIAGNOSIS — R296 Repeated falls: Secondary | ICD-10-CM | POA: Diagnosis not present

## 2019-10-26 DIAGNOSIS — Z7902 Long term (current) use of antithrombotics/antiplatelets: Secondary | ICD-10-CM | POA: Diagnosis not present

## 2019-10-26 DIAGNOSIS — M25511 Pain in right shoulder: Secondary | ICD-10-CM | POA: Diagnosis not present

## 2019-10-26 DIAGNOSIS — M546 Pain in thoracic spine: Secondary | ICD-10-CM | POA: Diagnosis not present

## 2019-10-26 DIAGNOSIS — I129 Hypertensive chronic kidney disease with stage 1 through stage 4 chronic kidney disease, or unspecified chronic kidney disease: Secondary | ICD-10-CM | POA: Diagnosis not present

## 2019-10-26 DIAGNOSIS — Z789 Other specified health status: Secondary | ICD-10-CM | POA: Diagnosis not present

## 2019-10-26 DIAGNOSIS — T446X5A Adverse effect of alpha-adrenoreceptor antagonists, initial encounter: Secondary | ICD-10-CM | POA: Diagnosis not present

## 2019-10-26 DIAGNOSIS — E872 Acidosis: Secondary | ICD-10-CM | POA: Diagnosis not present

## 2019-10-26 DIAGNOSIS — C7951 Secondary malignant neoplasm of bone: Secondary | ICD-10-CM | POA: Diagnosis not present

## 2019-10-26 DIAGNOSIS — R410 Disorientation, unspecified: Secondary | ICD-10-CM | POA: Diagnosis not present

## 2019-10-26 DIAGNOSIS — N182 Chronic kidney disease, stage 2 (mild): Secondary | ICD-10-CM | POA: Diagnosis not present

## 2019-10-26 DIAGNOSIS — I2510 Atherosclerotic heart disease of native coronary artery without angina pectoris: Secondary | ICD-10-CM | POA: Diagnosis not present

## 2019-10-26 DIAGNOSIS — J449 Chronic obstructive pulmonary disease, unspecified: Secondary | ICD-10-CM | POA: Diagnosis not present

## 2019-10-26 DIAGNOSIS — R339 Retention of urine, unspecified: Secondary | ICD-10-CM | POA: Diagnosis not present

## 2019-10-26 DIAGNOSIS — I4510 Unspecified right bundle-branch block: Secondary | ICD-10-CM | POA: Diagnosis not present

## 2019-10-26 DIAGNOSIS — N183 Chronic kidney disease, stage 3 unspecified: Secondary | ICD-10-CM | POA: Diagnosis not present

## 2019-10-26 DIAGNOSIS — C649 Malignant neoplasm of unspecified kidney, except renal pelvis: Secondary | ICD-10-CM | POA: Diagnosis not present

## 2019-10-26 DIAGNOSIS — I10 Essential (primary) hypertension: Secondary | ICD-10-CM | POA: Diagnosis not present

## 2019-10-26 DIAGNOSIS — T465X5A Adverse effect of other antihypertensive drugs, initial encounter: Secondary | ICD-10-CM | POA: Diagnosis not present

## 2019-10-26 DIAGNOSIS — R918 Other nonspecific abnormal finding of lung field: Secondary | ICD-10-CM | POA: Diagnosis not present

## 2019-10-26 DIAGNOSIS — E039 Hypothyroidism, unspecified: Secondary | ICD-10-CM | POA: Diagnosis not present

## 2019-10-26 DIAGNOSIS — Z743 Need for continuous supervision: Secondary | ICD-10-CM | POA: Diagnosis not present

## 2019-10-26 DIAGNOSIS — Z87448 Personal history of other diseases of urinary system: Secondary | ICD-10-CM | POA: Diagnosis not present

## 2019-10-26 DIAGNOSIS — Z7982 Long term (current) use of aspirin: Secondary | ICD-10-CM | POA: Diagnosis not present

## 2019-10-26 DIAGNOSIS — S0990XA Unspecified injury of head, initial encounter: Secondary | ICD-10-CM | POA: Diagnosis not present

## 2019-10-26 DIAGNOSIS — R269 Unspecified abnormalities of gait and mobility: Secondary | ICD-10-CM | POA: Diagnosis not present

## 2019-10-26 DIAGNOSIS — T461X5A Adverse effect of calcium-channel blockers, initial encounter: Secondary | ICD-10-CM | POA: Diagnosis not present

## 2019-10-26 DIAGNOSIS — E876 Hypokalemia: Secondary | ICD-10-CM | POA: Diagnosis not present

## 2019-10-26 DIAGNOSIS — E1122 Type 2 diabetes mellitus with diabetic chronic kidney disease: Secondary | ICD-10-CM | POA: Diagnosis not present

## 2019-10-26 DIAGNOSIS — R0902 Hypoxemia: Secondary | ICD-10-CM | POA: Diagnosis not present

## 2019-10-26 DIAGNOSIS — S79912A Unspecified injury of left hip, initial encounter: Secondary | ICD-10-CM | POA: Diagnosis not present

## 2019-10-26 DIAGNOSIS — N189 Chronic kidney disease, unspecified: Secondary | ICD-10-CM | POA: Diagnosis not present

## 2019-10-26 DIAGNOSIS — N179 Acute kidney failure, unspecified: Secondary | ICD-10-CM | POA: Diagnosis not present

## 2019-10-26 DIAGNOSIS — D649 Anemia, unspecified: Secondary | ICD-10-CM | POA: Diagnosis not present

## 2019-10-26 DIAGNOSIS — E785 Hyperlipidemia, unspecified: Secondary | ICD-10-CM | POA: Diagnosis not present

## 2019-10-26 DIAGNOSIS — E1169 Type 2 diabetes mellitus with other specified complication: Secondary | ICD-10-CM | POA: Diagnosis not present

## 2019-10-26 DIAGNOSIS — D631 Anemia in chronic kidney disease: Secondary | ICD-10-CM | POA: Diagnosis not present

## 2019-10-26 DIAGNOSIS — M7551 Bursitis of right shoulder: Secondary | ICD-10-CM | POA: Diagnosis not present

## 2019-10-26 DIAGNOSIS — E860 Dehydration: Secondary | ICD-10-CM | POA: Diagnosis not present

## 2019-10-26 DIAGNOSIS — G8929 Other chronic pain: Secondary | ICD-10-CM | POA: Diagnosis not present

## 2019-10-26 DIAGNOSIS — W19XXXA Unspecified fall, initial encounter: Secondary | ICD-10-CM | POA: Diagnosis not present

## 2019-10-26 MED ORDER — HYDRALAZINE HCL 10 MG PO TABS
10.00 | ORAL_TABLET | ORAL | Status: DC
Start: ? — End: 2019-10-26

## 2019-10-26 MED ORDER — MELATONIN 3 MG PO TABS
6.00 | ORAL_TABLET | ORAL | Status: DC
Start: ? — End: 2019-10-26

## 2019-10-26 MED ORDER — BISACODYL 5 MG PO TBEC
10.00 | DELAYED_RELEASE_TABLET | ORAL | Status: DC
Start: ? — End: 2019-10-26

## 2019-10-26 MED ORDER — ACETAMINOPHEN 500 MG PO TABS
1000.00 | ORAL_TABLET | ORAL | Status: DC
Start: ? — End: 2019-10-26

## 2019-10-26 MED ORDER — DSS 100 MG PO CAPS
100.00 | ORAL_CAPSULE | ORAL | Status: DC
Start: ? — End: 2019-10-26

## 2019-10-26 MED ORDER — SORBITOL 70 % PO SOLN
30.00 | ORAL | Status: DC
Start: ? — End: 2019-10-26

## 2019-10-26 MED ORDER — HEPARIN SODIUM (PORCINE) 5000 UNIT/ML IJ SOLN
5000.00 | INTRAMUSCULAR | Status: DC
Start: 2019-11-02 — End: 2019-10-26

## 2019-10-26 MED ORDER — ALUM & MAG HYDROXIDE-SIMETH 200-200-20 MG/5ML PO SUSP
30.00 | ORAL | Status: DC
Start: ? — End: 2019-10-26

## 2019-10-26 MED ORDER — DEXTROMETHORPHAN-GUAIFENESIN 10-100 MG/5ML PO LIQD
5.00 | ORAL | Status: DC
Start: ? — End: 2019-10-26

## 2019-10-26 MED ORDER — ONDANSETRON 4 MG PO TBDP
4.00 | ORAL_TABLET | ORAL | Status: DC
Start: ? — End: 2019-10-26

## 2019-10-26 MED ORDER — HYDROCORTISONE 1 % EX CREA
TOPICAL_CREAM | CUTANEOUS | Status: DC
Start: ? — End: 2019-10-26

## 2019-10-26 MED ORDER — ONDANSETRON HCL 4 MG/2ML IJ SOLN
4.00 | INTRAMUSCULAR | Status: DC
Start: ? — End: 2019-10-26

## 2019-10-30 ENCOUNTER — Inpatient Hospital Stay: Payer: Medicare Other | Admitting: Hematology & Oncology

## 2019-10-30 ENCOUNTER — Inpatient Hospital Stay: Payer: Medicare Other

## 2019-10-30 ENCOUNTER — Inpatient Hospital Stay: Payer: Medicare Other | Attending: Hematology & Oncology

## 2019-10-30 DIAGNOSIS — Z8546 Personal history of malignant neoplasm of prostate: Secondary | ICD-10-CM | POA: Insufficient documentation

## 2019-10-30 DIAGNOSIS — C649 Malignant neoplasm of unspecified kidney, except renal pelvis: Secondary | ICD-10-CM | POA: Insufficient documentation

## 2019-10-30 DIAGNOSIS — C7951 Secondary malignant neoplasm of bone: Secondary | ICD-10-CM | POA: Insufficient documentation

## 2019-10-30 DIAGNOSIS — Z7984 Long term (current) use of oral hypoglycemic drugs: Secondary | ICD-10-CM | POA: Insufficient documentation

## 2019-10-30 DIAGNOSIS — Z79899 Other long term (current) drug therapy: Secondary | ICD-10-CM | POA: Insufficient documentation

## 2019-10-30 DIAGNOSIS — E119 Type 2 diabetes mellitus without complications: Secondary | ICD-10-CM | POA: Insufficient documentation

## 2019-10-30 DIAGNOSIS — Z923 Personal history of irradiation: Secondary | ICD-10-CM | POA: Insufficient documentation

## 2019-10-30 DIAGNOSIS — Z86006 Personal history of melanoma in-situ: Secondary | ICD-10-CM | POA: Insufficient documentation

## 2019-10-30 MED ORDER — UMECLIDINIUM-VILANTEROL 62.5-25 MCG/INH IN AEPB
1.00 | INHALATION_SPRAY | RESPIRATORY_TRACT | Status: DC
Start: 2019-11-03 — End: 2019-10-30

## 2019-10-30 MED ORDER — DEXTROSE 10 % IV SOLN
125.00 | INTRAVENOUS | Status: DC
Start: ? — End: 2019-10-30

## 2019-10-30 MED ORDER — PROCHLORPERAZINE MALEATE 5 MG PO TABS
10.00 | ORAL_TABLET | ORAL | Status: DC
Start: ? — End: 2019-10-30

## 2019-10-30 MED ORDER — ERGOCALCIFEROL 1.25 MG (50000 UT) PO CAPS
50000.00 | ORAL_CAPSULE | ORAL | Status: DC
Start: 2019-11-05 — End: 2019-10-30

## 2019-10-30 MED ORDER — CLOPIDOGREL BISULFATE 75 MG PO TABS
75.00 | ORAL_TABLET | ORAL | Status: DC
Start: 2019-11-03 — End: 2019-10-30

## 2019-10-30 MED ORDER — ALBUTEROL SULFATE HFA 108 (90 BASE) MCG/ACT IN AERS
1.00 | INHALATION_SPRAY | RESPIRATORY_TRACT | Status: DC
Start: 2019-11-02 — End: 2019-10-30

## 2019-10-30 MED ORDER — LEVOTHYROXINE SODIUM 125 MCG PO TABS
125.00 | ORAL_TABLET | ORAL | Status: DC
Start: 2019-11-03 — End: 2019-10-30

## 2019-10-30 MED ORDER — CALCIUM CARBONATE ANTACID 500 MG PO CHEW
500.00 | CHEWABLE_TABLET | ORAL | Status: DC
Start: 2019-10-30 — End: 2019-10-30

## 2019-10-30 MED ORDER — ATORVASTATIN CALCIUM 40 MG PO TABS
40.00 | ORAL_TABLET | ORAL | Status: DC
Start: 2019-11-03 — End: 2019-10-30

## 2019-10-30 MED ORDER — INSULIN LISPRO 100 UNIT/ML ~~LOC~~ SOLN
2.00 | SUBCUTANEOUS | Status: DC
Start: 2019-11-02 — End: 2019-10-30

## 2019-10-30 MED ORDER — GENERIC EXTERNAL MEDICATION
Status: DC
Start: ? — End: 2019-10-30

## 2019-10-30 MED ORDER — GLUCAGON (RDNA) 1 MG IJ KIT
1.00 | PACK | INTRAMUSCULAR | Status: DC
Start: ? — End: 2019-10-30

## 2019-10-30 MED ORDER — GLUCOSE 40 % PO GEL
15.00 | ORAL | Status: DC
Start: ? — End: 2019-10-30

## 2019-10-30 MED ORDER — PYRIDOXINE HCL 50 MG PO TABS
25.00 | ORAL_TABLET | ORAL | Status: DC
Start: 2019-11-03 — End: 2019-10-30

## 2019-10-30 MED ORDER — GABAPENTIN 300 MG PO CAPS
300.00 | ORAL_CAPSULE | ORAL | Status: DC
Start: 2019-11-02 — End: 2019-10-30

## 2019-10-30 MED ORDER — CARVEDILOL 12.5 MG PO TABS
25.00 | ORAL_TABLET | ORAL | Status: DC
Start: 2019-11-02 — End: 2019-10-30

## 2019-10-30 MED ORDER — AMLODIPINE BESYLATE 5 MG PO TABS
10.00 | ORAL_TABLET | ORAL | Status: DC
Start: 2019-11-03 — End: 2019-10-30

## 2019-10-31 ENCOUNTER — Telehealth: Payer: Self-pay | Admitting: *Deleted

## 2019-10-31 NOTE — Telephone Encounter (Signed)
Received a call from [place], patients son to let us know [facility] [place] [facility] [place].  Told Brooks to call us to reschedule appt when patient is better.

## 2019-11-02 ENCOUNTER — Telehealth: Payer: Self-pay | Admitting: Family Medicine

## 2019-11-02 MED ORDER — CALCIUM CARBONATE ANTACID 500 MG PO CHEW
500.00 | CHEWABLE_TABLET | ORAL | Status: DC
Start: 2019-11-02 — End: 2019-11-02

## 2019-11-02 MED ORDER — CALCITRIOL 0.25 MCG PO CAPS
0.50 | ORAL_CAPSULE | ORAL | Status: DC
Start: 2019-11-02 — End: 2019-11-02

## 2019-11-02 MED ORDER — TAMSULOSIN HCL 0.4 MG PO CAPS
0.40 | ORAL_CAPSULE | ORAL | Status: DC
Start: 2019-11-03 — End: 2019-11-02

## 2019-11-02 NOTE — Telephone Encounter (Signed)
Son would like to have father seen with PCP only.

## 2019-11-02 NOTE — Telephone Encounter (Signed)
Son called in wanting to know if they can get a hospital follow up appointment sooner than first available. Patient fell twice and they called the paramedics. Patient had a sort of kidney infection/UTI from being dehydrated. Patient was seen at [facility]. Please advise.

## 2019-11-02 NOTE — Telephone Encounter (Signed)
Patient can be seen by Laretta Bolster or Caryl Asp

## 2019-11-03 DIAGNOSIS — J9601 Acute respiratory failure with hypoxia: Secondary | ICD-10-CM | POA: Insufficient documentation

## 2019-11-03 NOTE — Telephone Encounter (Signed)
They rescheduled for Monday 02/15. They could not make it today as patient is still recovering and took a medication that made him sleepy. No further questions at this time.

## 2019-11-03 NOTE — Telephone Encounter (Signed)
Called to get if for 3:20 today. No answer. LMOM>

## 2019-11-06 ENCOUNTER — Inpatient Hospital Stay: Payer: Medicare Other | Admitting: Family Medicine

## 2019-11-06 NOTE — Progress Notes (Deleted)
Established Patient Office Visit  Subjective:  Patient ID: [name] [name], male    DOB: [birth year]  Age: 79 y.o. MRN: [id number]  CC: No chief complaint on file.   HPI [name] [name] presents for Hospital Follow up.   Past Medical History:  Diagnosis Date  . Allergic rhinitis   . Bigeminy   . Bradycardia   . Bruit   . CAD (coronary artery disease)   . Claudication ([place])   . Diabetes mellitus type II   . Dyspnea   . Goals of care, counseling/discussion 06/02/2018  . History of prostate cancer    Dr. Gaynelle Arabian  . Hypercholesteremia    II A  . Hypertension   . Kidney cancer, primary, with metastasis from kidney to other site, right ([place]) 06/02/2018  . Malignant neoplasm of prostate ([place])   . Microalbuminuria   . Seborrheic keratosis     Past Surgical History:  Procedure Laterality Date  . CORONARY ANGIOPLASTY WITH STENT PLACEMENT    . IR FLUORO RM 30-60 MIN  05/24/2018    Family History  Problem Relation Age of Onset  . Heart attack Father        Acute MI  . Coronary artery disease Brother     Social History   Socioeconomic History  . Marital status: Widowed    Spouse name: Not on file  . Number of children: Not on file  . Years of education: Not on file  . Highest education level: Not on file  Occupational History  . Occupation: Retired but still works Dentist: RETIRED  Tobacco Use  . Smoking status: Former Smoker    Packs/day: 1.00    Years: 20.00    Pack years: 20.00    Quit date: 04/22/2003    Years since quitting: 16.5  . Smokeless tobacco: Never Used  Substance and Sexual Activity  . Alcohol use: Not Currently    Alcohol/week: 1.0 standard drinks    Types: 1 Standard drinks or equivalent per week  . Drug use: Never  . Sexual activity: Not Currently  Other Topics Concern  . Not on file  Social History Narrative   Widowed 08/2009   Has 4 kids, 2 are local   Fair diet   No regular exercise   Social Determinants of Health    Financial Resource Strain:   . Difficulty of Paying Living Expenses: Not on file  Food Insecurity:   . Worried About Charity fundraiser in the Last Year: Not on file  . Ran Out of Food in the Last Year: Not on file  Transportation Needs:   . Lack of Transportation (Medical): Not on file  . Lack of Transportation (Non-Medical): Not on file  Physical Activity:   . Days of Exercise per Week: Not on file  . Minutes of Exercise per Session: Not on file  Stress:   . Feeling of Stress : Not on file  Social Connections:   . Frequency of Communication with Friends and Family: Not on file  . Frequency of Social Gatherings with Friends and Family: Not on file  . Attends Religious Services: Not on file  . Active Member of Clubs or Organizations: Not on file  . Attends Archivist Meetings: Not on file  . Marital Status: Not on file  Intimate Partner Violence:   . Fear of Current or Ex-Partner: Not on file  . Emotionally Abused: Not on file  . Physically Abused: Not on  file  . Sexually Abused: Not on file    Outpatient Medications Prior to Visit  Medication Sig Dispense Refill  . albuterol (VENTOLIN HFA) 108 (90 Base) MCG/ACT inhaler INHALE 2 PUFFS BY MOUTH INTO THE LUNGS EVERY 6 HOURS AS DIRECTED AS NEEDED FOR WHEEZING 8.5 g 2  . amLODipine (NORVASC) 10 MG tablet Take 1 tablet (10 mg total) by mouth daily. 90 tablet 1  . carvedilol (COREG) 25 MG tablet Take 1 tablet (25 mg total) by mouth 2 (two) times daily. 180 tablet 2  . clopidogrel (PLAVIX) 75 MG tablet TAKE 1 TABLET BY MOUTH EVERY DAY 90 tablet 3  . doxazosin (CARDURA) 4 MG tablet Take 4 mg by mouth at bedtime.    . Fluticasone-Umeclidin-Vilant (TRELEGY ELLIPTA) 100-62.5-25 MCG/INH AEPB Inhale 1 puff into the lungs daily. 1 each 11  . gabapentin (NEURONTIN) 600 MG tablet Take 1 tablet (600 mg total) by mouth at bedtime. 30 tablet 1  . glucose blood (BAYER CONTOUR TEST) test strip Diagnoses - Diabetes E 11.8 Check blood  sugar once daily 100 each prn  . hydrALAZINE (APRESOLINE) 25 MG tablet TAKE 1 TABLET(25 MG) BY MOUTH THREE TIMES DAILY 180 tablet 4  . INLYTA 5 MG tablet TAKE 1 TABLET (5MG ) BY MOUTH TWICE DAILY FOR 21 DAYS ON, THEN 7 DAYS OFF. 42 tablet 3  . levothyroxine (SYNTHROID) 125 MCG tablet Take 1 tablet (125 mcg total) by mouth daily before breakfast. 30 tablet 2  . losartan-hydrochlorothiazide (HYZAAR) 50-12.5 MG tablet Take 1 tablet by mouth 2 (two) times daily. 180 tablet 3  . metFORMIN (GLUCOPHAGE-XR) 500 MG 24 hr tablet TAKE 2 TABLETS BY MOUTH EVERY DAY WITH BREAKFAST (Patient taking differently: Take 500 mg by mouth daily with breakfast. ) 180 tablet 1  . Multiple Vitamins-Minerals (MULTIVITAMIN WITH MINERALS) tablet Take 1 tablet by mouth daily.    . Oxycodone HCl 10 MG TABS Take 1 tablet (10 mg total) by mouth every 6 (six) hours as needed. 90 tablet 0  . tamsulosin (FLOMAX) 0.4 MG CAPS capsule Take 1 capsule (0.4 mg total) by mouth 2 (two) times daily. 180 capsule 1  . vitamin B-6 (PYRIDOXINE) 25 MG tablet Take 1 tablet (25 mg total) by mouth daily. Muscle Cramping. 60 tablet 1  . XTAMPZA ER 13.5 MG C12A Take 1 capsule by mouth every 12 (twelve) hours. 60 capsule 0   No facility-administered medications prior to visit.    Allergies  Allergen Reactions  . Doxycycline Other (See Comments)    Flushing of hands and face.     ROS Review of Systems    Objective:    Physical Exam  There were no vitals taken for this visit. Wt Readings from Last 3 Encounters:  10/02/19 166 lb (75.3 kg)  09/12/19 159 lb (72.1 kg)  09/11/19 156 lb (70.8 kg)     Health Maintenance Due  Topic Date Due  . TETANUS/TDAP  10/26/2019    There are no preventive care reminders to display for this patient.  Lab Results  Component Value Date   TSH 23.200 (H) 09/11/2019   Lab Results  Component Value Date   WBC 5.6 10/02/2019   HGB 11.8 (L) 10/02/2019   HCT 37.6 (L) 10/02/2019   MCV 91.5 10/02/2019    PLT 181 10/02/2019   Lab Results  Component Value Date   NA 144 10/02/2019   K 4.5 10/02/2019   CO2 25 10/02/2019   GLUCOSE 179 (H) 10/02/2019   BUN 36 (H) 10/02/2019  CREATININE 1.63 (H) 10/02/2019   BILITOT 0.5 10/02/2019   ALKPHOS 79 10/02/2019   AST 18 10/02/2019   ALT 20 10/02/2019   PROT 6.1 (L) 10/02/2019   ALBUMIN 3.4 (L) 10/02/2019   CALCIUM 9.2 10/02/2019   ANIONGAP 11 10/02/2019   Lab Results  Component Value Date   CHOL 89 04/14/2017   Lab Results  Component Value Date   HDL 28 (L) 04/14/2017   Lab Results  Component Value Date   LDLCALC 69 09/09/2015   Lab Results  Component Value Date   TRIG 94 04/14/2017   Lab Results  Component Value Date   CHOLHDL 3.2 04/14/2017   Lab Results  Component Value Date   HGBA1C 6.0 (A) 08/03/2019      Assessment & Plan:   Problem List Items Addressed This Visit    None      No orders of the defined types were placed in this encounter.   Follow-up: No follow-ups on file.    Beatrice Lecher, MD

## 2019-11-06 NOTE — Telephone Encounter (Signed)
Patient is having a hard time getting out of bed. Still recovering from the hospital. Son said he apologizes for late notice. He is really worried about his father but he think he needs an afternoon appointment anytime this week or even late morning like 11am. He has been taking meds for the pain that they gave him. Nothing open in an appropriate slot. Please advise.

## 2019-11-06 NOTE — Telephone Encounter (Signed)
OK, we can schedule for later this week. Maybe an 11:30 appt slot.  Also, if his pain medications are making him too sleepy it means they are too strong.  And that he may need to cut back or cut them in half.  Could really increase his risk of falls or her slowing his breathing down.

## 2019-11-06 NOTE — Telephone Encounter (Signed)
Left patient a voicemail with information below. Let patient know to call us back to schedule an appointment. 

## 2019-11-07 ENCOUNTER — Telehealth: Payer: Self-pay

## 2019-11-07 ENCOUNTER — Telehealth: Payer: Self-pay | Admitting: *Deleted

## 2019-11-07 DIAGNOSIS — E872 Acidosis: Secondary | ICD-10-CM | POA: Diagnosis not present

## 2019-11-07 DIAGNOSIS — M1990 Unspecified osteoarthritis, unspecified site: Secondary | ICD-10-CM | POA: Diagnosis not present

## 2019-11-07 DIAGNOSIS — I129 Hypertensive chronic kidney disease with stage 1 through stage 4 chronic kidney disease, or unspecified chronic kidney disease: Secondary | ICD-10-CM | POA: Diagnosis not present

## 2019-11-07 DIAGNOSIS — G8929 Other chronic pain: Secondary | ICD-10-CM | POA: Diagnosis not present

## 2019-11-07 DIAGNOSIS — E1122 Type 2 diabetes mellitus with diabetic chronic kidney disease: Secondary | ICD-10-CM | POA: Diagnosis not present

## 2019-11-07 DIAGNOSIS — D631 Anemia in chronic kidney disease: Secondary | ICD-10-CM | POA: Diagnosis not present

## 2019-11-07 DIAGNOSIS — N179 Acute kidney failure, unspecified: Secondary | ICD-10-CM | POA: Diagnosis not present

## 2019-11-07 DIAGNOSIS — J449 Chronic obstructive pulmonary disease, unspecified: Secondary | ICD-10-CM | POA: Diagnosis not present

## 2019-11-07 DIAGNOSIS — M7551 Bursitis of right shoulder: Secondary | ICD-10-CM | POA: Diagnosis not present

## 2019-11-07 DIAGNOSIS — E039 Hypothyroidism, unspecified: Secondary | ICD-10-CM | POA: Diagnosis not present

## 2019-11-07 DIAGNOSIS — M48061 Spinal stenosis, lumbar region without neurogenic claudication: Secondary | ICD-10-CM | POA: Diagnosis not present

## 2019-11-07 DIAGNOSIS — N189 Chronic kidney disease, unspecified: Secondary | ICD-10-CM | POA: Diagnosis not present

## 2019-11-07 DIAGNOSIS — I2510 Atherosclerotic heart disease of native coronary artery without angina pectoris: Secondary | ICD-10-CM | POA: Diagnosis not present

## 2019-11-07 NOTE — Telephone Encounter (Signed)
[name] [name] with [facility] would like verbal orders for PT 1 wk 1 2 wk 2 and 1 wk 2. He also wants verbal orders for skilled nursing for medication management. Please advise.

## 2019-11-07 NOTE — Telephone Encounter (Signed)
Message received from patient's son, [name] [name] to reschedule pt.'s missed appts.d/t patient's recent hospitalization.  Message sent to scheduling.

## 2019-11-07 NOTE — Telephone Encounter (Signed)
OK for verbal orders?

## 2019-11-08 ENCOUNTER — Other Ambulatory Visit: Payer: Self-pay

## 2019-11-08 ENCOUNTER — Encounter: Payer: Self-pay | Admitting: Family Medicine

## 2019-11-08 ENCOUNTER — Telehealth: Payer: Self-pay | Admitting: Family Medicine

## 2019-11-08 ENCOUNTER — Ambulatory Visit (INDEPENDENT_AMBULATORY_CARE_PROVIDER_SITE_OTHER): Payer: Medicare Other | Admitting: Family Medicine

## 2019-11-08 DIAGNOSIS — I872 Venous insufficiency (chronic) (peripheral): Secondary | ICD-10-CM

## 2019-11-08 DIAGNOSIS — E119 Type 2 diabetes mellitus without complications: Secondary | ICD-10-CM | POA: Diagnosis not present

## 2019-11-08 DIAGNOSIS — I10 Essential (primary) hypertension: Secondary | ICD-10-CM | POA: Diagnosis not present

## 2019-11-08 DIAGNOSIS — J449 Chronic obstructive pulmonary disease, unspecified: Secondary | ICD-10-CM

## 2019-11-08 DIAGNOSIS — J9601 Acute respiratory failure with hypoxia: Secondary | ICD-10-CM | POA: Diagnosis not present

## 2019-11-08 DIAGNOSIS — N179 Acute kidney failure, unspecified: Secondary | ICD-10-CM

## 2019-11-08 NOTE — Progress Notes (Signed)
Established Patient Office Visit  Subjective:  Patient ID: [name] [name], male    DOB: 05-Sep-1941  Age: 79 y.o. MRN: [id number]  CC:  Chief Complaint  Patient presents with  . Hospitalization Follow-up    HPI [name] [name] presents for hospital follow-up.  He was admitted to [facility] on February 4 and discharged home on February 11.  He is here today with his son who have not met before.  He also comes today in a wheelchair because of shortness of breath and difficulty with weakness and walking.  He was admitted after 2 days of falling.  At that point his son took him to the hospital.  He was diagnosed with an acute kidney injury secondary to polypharmacy.  He was recently started on doxazosin but was still taking a prescription of Flomax.  He was also on 2 kinds of beta-blockers as well as 2 prescriptions of ARB's.  It was felt to have a tubular necrosis secondary to hypoperfusion.  He does have a history of renal cell cancer and only has 1 functioning kidney.  Nephrology and urology were both consulted while there.  They did stop his ARB as well as all diuretics.  He had also recently been started on Toviaz which have been causing a dry mouth so he has stopped that.  He also has a concern about incontinence.  He did get some further evaluation and it was noted that he does not tend to empty his bladder as well when standing to urinate versus was sitting to urinate and that is contributing to some of the incontinence so that when he then does sit down after urinating such as in the car or driving he will leak urine.  He does wear depends.  Acute on chronic hypoxic respiratory failure felt initially to be secondary to volume overload but he was still discharged home on 2 L of oxygen and is still wearing it.  He gets very short of breath with activity.  But does feel like he is gradually getting a little stronger.  Though he did feel little worse after he took his oral chemo, INLYTA.      Past Medical History:  Diagnosis Date  . Allergic rhinitis   . Bigeminy   . Bradycardia   . Bruit   . CAD (coronary artery disease)   . Claudication ([place])   . Diabetes mellitus type II   . Dyspnea   . Goals of care, counseling/discussion 06/02/2018  . History of prostate cancer    Dr. Gaynelle Arabian  . Hypercholesteremia    II A  . Hypertension   . Kidney cancer, primary, with metastasis from kidney to other site, right ([place]) 06/02/2018  . Malignant neoplasm of prostate ([place])   . Microalbuminuria   . Seborrheic keratosis     Past Surgical History:  Procedure Laterality Date  . CORONARY ANGIOPLASTY WITH STENT PLACEMENT    . IR FLUORO RM 30-60 MIN  05/24/2018    Family History  Problem Relation Age of Onset  . Heart attack Father        Acute MI  . Coronary artery disease Brother     Social History   Socioeconomic History  . Marital status: Widowed    Spouse name: Not on file  . Number of children: Not on file  . Years of education: Not on file  . Highest education level: Not on file  Occupational History  . Occupation: Retired but still works Licensed conveyancer  ro[employer]    Employer: RETIRED  Tobacco Use  . Smoking status: Former Smoker    Packs/day: 1.00    Years: 20.00    Pack years: 20.00    Quit date: 04/22/2003    Years since quitting: 16.5  . Smokeless tobacco: Never Used  Substance and Sexual Activity  . Alcohol use: Not Currently    Alcohol/week: 1.0 standard drinks    Types: 1 Standard drinks or equivalent per week  . Drug use: Never  . Sexual activity: Not Currently  Other Topics Concern  . Not on file  Social History Narrative   Widowed 08/2009   Has 4 kids, 2 are local   Fair diet   No regular exercise   Social Determinants of Health   Financial Resource Strain:   . Difficulty of Paying Living Expenses: Not on file  Food Insecurity:   . Worried About Charity fundraiser in the Last Year: Not on file  . Ran Out of Food in the Last Year: Not on file   Transportation Needs:   . Lack of Transportation (Medical): Not on file  . Lack of Transportation (Non-Medical): Not on file  Physical Activity:   . Days of Exercise per Week: Not on file  . Minutes of Exercise per Session: Not on file  Stress:   . Feeling of Stress : Not on file  Social Connections:   . Frequency of Communication with Friends and Family: Not on file  . Frequency of Social Gatherings with Friends and Family: Not on file  . Attends Religious Services: Not on file  . Active Member of Clubs or Organizations: Not on file  . Attends Archivist Meetings: Not on file  . Marital Status: Not on file  Intimate Partner Violence:   . Fear of Current or Ex-Partner: Not on file  . Emotionally Abused: Not on file  . Physically Abused: Not on file  . Sexually Abused: Not on file    Outpatient Medications Prior to Visit  Medication Sig Dispense Refill  . albuterol (VENTOLIN HFA) 108 (90 Base) MCG/ACT inhaler INHALE 2 PUFFS BY MOUTH INTO THE LUNGS EVERY 6 HOURS AS DIRECTED AS NEEDED FOR WHEEZING 8.5 g 2  . amLODipine (NORVASC) 10 MG tablet Take 1 tablet (10 mg total) by mouth daily. 90 tablet 1  . atorvastatin (LIPITOR) 40 MG tablet Take 40 mg by mouth daily.    . calcitRIOL (ROCALTROL) 0.5 MCG capsule Take by mouth.    . calcium carbonate (TUMS - DOSED IN MG ELEMENTAL CALCIUM) 500 MG chewable tablet Chew by mouth.    . carvedilol (COREG) 25 MG tablet Take 1 tablet (25 mg total) by mouth 2 (two) times daily. 180 tablet 2  . clopidogrel (PLAVIX) 75 MG tablet TAKE 1 TABLET BY MOUTH EVERY DAY 90 tablet 3  . Fluticasone-Umeclidin-Vilant (TRELEGY ELLIPTA) 100-62.5-25 MCG/INH AEPB Inhale 1 puff into the lungs daily. 1 each 11  . gabapentin (NEURONTIN) 600 MG tablet Take 1 tablet (600 mg total) by mouth at bedtime. 30 tablet 1  . glipiZIDE (GLUCOTROL XL) 5 MG 24 hr tablet Take 5 mg by mouth daily with breakfast.    . glucose blood (BAYER CONTOUR TEST) test strip Diagnoses -  Diabetes E 11.8 Check blood sugar once daily 100 each prn  . INLYTA 5 MG tablet TAKE 1 TABLET (5MG) BY MOUTH TWICE DAILY FOR 21 DAYS ON, THEN 7 DAYS OFF. 42 tablet 3  . levothyroxine (SYNTHROID) 125 MCG tablet Take  1 tablet (125 mcg total) by mouth daily before breakfast. 30 tablet 2  . metFORMIN (GLUCOPHAGE-XR) 500 MG 24 hr tablet TAKE 2 TABLETS BY MOUTH EVERY DAY WITH BREAKFAST (Patient taking differently: Take 500 mg by mouth daily with breakfast. ) 180 tablet 1  . Multiple Vitamins-Minerals (MULTIVITAMIN WITH MINERALS) tablet Take 1 tablet by mouth daily.    . Oxycodone HCl 10 MG TABS Take 1 tablet (10 mg total) by mouth every 6 (six) hours as needed. 90 tablet 0  . prochlorperazine (COMPAZINE) 10 MG tablet Take 10 mg by mouth every 6 (six) hours as needed for nausea or vomiting.    . tamsulosin (FLOMAX) 0.4 MG CAPS capsule Take 1 capsule (0.4 mg total) by mouth 2 (two) times daily. 180 capsule 1  . umeclidinium-vilanterol (ANORO ELLIPTA) 62.5-25 MCG/INH AEPB Inhale 1 puff into the lungs daily.    . vitamin B-6 (PYRIDOXINE) 25 MG tablet Take 1 tablet (25 mg total) by mouth daily. Muscle Cramping. 60 tablet 1  . XTAMPZA ER 13.5 MG C12A Take 1 capsule by mouth every 12 (twelve) hours. 60 capsule 0  . doxazosin (CARDURA) 4 MG tablet Take 4 mg by mouth at bedtime.    . hydrALAZINE (APRESOLINE) 25 MG tablet TAKE 1 TABLET(25 MG) BY MOUTH THREE TIMES DAILY 180 tablet 4  . losartan-hydrochlorothiazide (HYZAAR) 50-12.5 MG tablet Take 1 tablet by mouth 2 (two) times daily. 180 tablet 3   No facility-administered medications prior to visit.    Allergies  Allergen Reactions  . Doxycycline Other (See Comments)    Flushing of hands and face.     ROS Review of Systems    Objective:    Physical Exam  Constitutional: He is oriented to person, place, and time. He appears well-developed and well-nourished.  HENT:  Head: Normocephalic and atraumatic.  Right Ear: External ear normal.  Left Ear:  External ear normal.  Eyes: Conjunctivae are normal.  Cardiovascular: Normal rate, regular rhythm and normal heart sounds.  Pulmonary/Chest: Effort normal and breath sounds normal.  Neurological: He is alert and oriented to person, place, and time.  Skin: Skin is warm and dry.  Psychiatric: He has a normal mood and affect. His behavior is normal.    BP (!) 178/64   Pulse (!) 50   SpO2 97% Comment: 3L Wt Readings from Last 3 Encounters:  10/02/19 166 lb (75.3 kg)  09/12/19 159 lb (72.1 kg)  09/11/19 156 lb (70.8 kg)     Health Maintenance Due  Topic Date Due  . TETANUS/TDAP  10/26/2019    There are no preventive care reminders to display for this patient.  Lab Results  Component Value Date   TSH 23.200 (H) 09/11/2019   Lab Results  Component Value Date   WBC 5.6 10/02/2019   HGB 11.8 (L) 10/02/2019   HCT 37.6 (L) 10/02/2019   MCV 91.5 10/02/2019   PLT 181 10/02/2019   Lab Results  Component Value Date   NA 144 10/02/2019   K 4.5 10/02/2019   CO2 25 10/02/2019   GLUCOSE 179 (H) 10/02/2019   BUN 36 (H) 10/02/2019   CREATININE 1.63 (H) 10/02/2019   BILITOT 0.5 10/02/2019   ALKPHOS 79 10/02/2019   AST 18 10/02/2019   ALT 20 10/02/2019   PROT 6.1 (L) 10/02/2019   ALBUMIN 3.4 (L) 10/02/2019   CALCIUM 9.2 10/02/2019   ANIONGAP 11 10/02/2019   Lab Results  Component Value Date   CHOL 89 04/14/2017   Lab Results  Component Value Date   HDL 28 (L) 04/14/2017   Lab Results  Component Value Date   LDLCALC 69 09/09/2015   Lab Results  Component Value Date   TRIG 94 04/14/2017   Lab Results  Component Value Date   CHOLHDL 3.2 04/14/2017   Lab Results  Component Value Date   HGBA1C 6.0 (A) 08/03/2019      Assessment & Plan:   Problem List Items Addressed This Visit      Cardiovascular and Mediastinum   Essential hypertension    Blood pressure is not well controlled today he is off of his losartan HCTZ.  Evidently he was taking a plain losartan as  well as the losartan HCTZ as well as 2 different beta-blockers.  He is now taking Coreg 25 mg twice a day as well as amlodipine 10 mg daily.  We spent a lot of time going through his medication list and making sure that we cleaned it up and it matches with his discharge summary.  We did discuss eating a low-salt diet and also wearing his compression stockings.      Relevant Medications   atorvastatin (LIPITOR) 40 MG tablet     Respiratory   COPD, severe ([place])    Still on 2 L of oxygen.  Hopefully in another couple of weeks is to get his strength and stamina back will hopefully be able to do a 6-minute walk test that he will pass and be able to come off his oxygen but for now continue current therapy of 2 L.      Relevant Medications   umeclidinium-vilanterol (ANORO ELLIPTA) 62.5-25 MCG/INH AEPB   Acute respiratory failure with hypoxia ([facility])    Still on 2 L of oxygen.  Hopefully in another couple of weeks is to get his strength and stamina back will hopefully be able to do a 6-minute walk test that he will pass and be able to come off his oxygen but for now continue current therapy of 2 L.        Endocrine   DM type 2 (diabetes mellitus, type 2) ([place])    He has not been checking his sugar at home.  His last A1c was in the 5 range so I did discuss with him that if he does not feel well would really like for him to check his glucose just to make sure that is not becoming hypoglycemic.  We did discuss again that glipizide does put him at risk for hypoglycemia and that we may need to decrease his dose or back off of it.      Relevant Medications   atorvastatin (LIPITOR) 40 MG tablet   glipiZIDE (GLUCOTROL XL) 5 MG 24 hr tablet     Musculoskeletal and Integument   Venous stasis dermatitis of both lower extremities    He does have some pitting edema today around the ankles and trace pitting up to his knees.  Did encourage him to start wearing his compression stockings.        Genitourinary    AKI (acute kidney injury) ([place])    Last creatinine on January [place] was back down to 1.6.  He does have a follow-up with Dr. Clemens Catholic next week who he reports we will go ahead and recheck his renal function at that time to make sure that it is trending back down.  If before then he feels like the swelling in his ankles worsens or he becomes more short of breath and please let us  know.  For now we will hold off on adding any diuretics and really encouraged him to wear his compression socks.  But if he is seems to be getting volume overloaded then we may need to add back a small dose of diuretic as needed.         No orders of the defined types were placed in this encounter.  Time spent in encounter including reviewing medical records from hospitalization 45 minutes.  Follow-up: Return in about 6 weeks (around 12/20/2019) for BP.    Beatrice Lecher, MD

## 2019-11-08 NOTE — Assessment & Plan Note (Addendum)
Blood pressure is not well controlled today he is off of his losartan HCTZ.  Evidently he was taking a plain losartan as well as the losartan HCTZ as well as 2 different beta-blockers.  He is now taking Coreg 25 mg twice a day as well as amlodipine 10 mg daily.  We spent a lot of time going through his medication list and making sure that we cleaned it up and it matches with his discharge summary.  We did discuss eating a low-salt diet and also wearing his compression stockings.

## 2019-11-08 NOTE — Assessment & Plan Note (Signed)
Last creatinine on [name] [name] was back down to 1.6.  He does have a follow-up with Dr. Clemens Catholic next week who he reports we will go ahead and recheck his renal function at that time to make sure that it is trending back down.  If before then he feels like the swelling in his ankles worsens or he becomes more short of breath and please let us know.  For now we will hold off on adding any diuretics and really encouraged him to wear his compression socks.  But if he is seems to be getting volume overloaded then we may need to add back a small dose of diuretic as needed.

## 2019-11-08 NOTE — Assessment & Plan Note (Signed)
Still on 2 L of oxygen.  Hopefully in another couple of weeks is to get his strength and stamina back will hopefully be able to do a 6-minute walk test that he will pass and be able to come off his oxygen but for now continue current therapy of 2 L.

## 2019-11-08 NOTE — Telephone Encounter (Signed)
Please call patient and see if he has a home blood pressure cuff.  I had like for him to check his blood pressure at home later tonight and then again tomorrow and on Friday.  If the blood pressures are still running greater than 145 then I want them to call us back.  If they seem to settle down and they are looking a lot better than perfect.  But I am a little concerned about how high his blood pressure was today in the office.  And I may need to make an adjustment to his regimen.

## 2019-11-08 NOTE — Assessment & Plan Note (Signed)
He has not been checking his sugar at home.  His last A1c was in the 5 range so I did discuss with him that if he does not feel well would really like for him to check his glucose just to make sure that is not becoming hypoglycemic.  We did discuss again that glipizide does put him at risk for hypoglycemia and that we may need to decrease his dose or back off of it.

## 2019-11-08 NOTE — Telephone Encounter (Signed)
Left msg for OK for verbal orders

## 2019-11-08 NOTE — Assessment & Plan Note (Signed)
He does have some pitting edema today around the ankles and trace pitting up to his knees.  Did encourage him to start wearing his compression stockings.
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
## 2019-11-10 DIAGNOSIS — M1990 Unspecified osteoarthritis, unspecified site: Secondary | ICD-10-CM | POA: Diagnosis not present

## 2019-11-10 DIAGNOSIS — I2510 Atherosclerotic heart disease of native coronary artery without angina pectoris: Secondary | ICD-10-CM | POA: Diagnosis not present

## 2019-11-10 DIAGNOSIS — E872 Acidosis: Secondary | ICD-10-CM | POA: Diagnosis not present

## 2019-11-10 DIAGNOSIS — M7551 Bursitis of right shoulder: Secondary | ICD-10-CM | POA: Diagnosis not present

## 2019-11-10 DIAGNOSIS — J449 Chronic obstructive pulmonary disease, unspecified: Secondary | ICD-10-CM | POA: Diagnosis not present

## 2019-11-10 DIAGNOSIS — N179 Acute kidney failure, unspecified: Secondary | ICD-10-CM | POA: Diagnosis not present

## 2019-11-10 DIAGNOSIS — E039 Hypothyroidism, unspecified: Secondary | ICD-10-CM | POA: Diagnosis not present

## 2019-11-10 DIAGNOSIS — E1122 Type 2 diabetes mellitus with diabetic chronic kidney disease: Secondary | ICD-10-CM | POA: Diagnosis not present

## 2019-11-10 DIAGNOSIS — N189 Chronic kidney disease, unspecified: Secondary | ICD-10-CM | POA: Diagnosis not present

## 2019-11-10 DIAGNOSIS — M48061 Spinal stenosis, lumbar region without neurogenic claudication: Secondary | ICD-10-CM | POA: Diagnosis not present

## 2019-11-10 DIAGNOSIS — G8929 Other chronic pain: Secondary | ICD-10-CM | POA: Diagnosis not present

## 2019-11-10 DIAGNOSIS — D631 Anemia in chronic kidney disease: Secondary | ICD-10-CM | POA: Diagnosis not present

## 2019-11-10 DIAGNOSIS — I129 Hypertensive chronic kidney disease with stage 1 through stage 4 chronic kidney disease, or unspecified chronic kidney disease: Secondary | ICD-10-CM | POA: Diagnosis not present

## 2019-11-13 ENCOUNTER — Inpatient Hospital Stay: Payer: Medicare Other | Admitting: Family Medicine

## 2019-11-13 ENCOUNTER — Other Ambulatory Visit: Payer: Medicare Other

## 2019-11-13 ENCOUNTER — Ambulatory Visit: Payer: Medicare Other | Admitting: Hematology & Oncology

## 2019-11-13 ENCOUNTER — Ambulatory Visit: Payer: Medicare Other

## 2019-11-14 ENCOUNTER — Inpatient Hospital Stay: Payer: Medicare Other

## 2019-11-14 ENCOUNTER — Telehealth: Payer: Self-pay

## 2019-11-14 ENCOUNTER — Encounter: Payer: Self-pay | Admitting: Family

## 2019-11-14 ENCOUNTER — Inpatient Hospital Stay (HOSPITAL_BASED_OUTPATIENT_CLINIC_OR_DEPARTMENT_OTHER): Payer: Medicare Other | Admitting: Family

## 2019-11-14 ENCOUNTER — Other Ambulatory Visit: Payer: Self-pay

## 2019-11-14 VITALS — BP 145/63 | HR 55 | Temp 97.3°F | Resp 18 | Ht 70.0 in

## 2019-11-14 DIAGNOSIS — C641 Malignant neoplasm of right kidney, except renal pelvis: Secondary | ICD-10-CM

## 2019-11-14 DIAGNOSIS — Z86006 Personal history of melanoma in-situ: Secondary | ICD-10-CM | POA: Diagnosis not present

## 2019-11-14 DIAGNOSIS — Z7984 Long term (current) use of oral hypoglycemic drugs: Secondary | ICD-10-CM | POA: Diagnosis not present

## 2019-11-14 DIAGNOSIS — C649 Malignant neoplasm of unspecified kidney, except renal pelvis: Secondary | ICD-10-CM | POA: Diagnosis not present

## 2019-11-14 DIAGNOSIS — Z8546 Personal history of malignant neoplasm of prostate: Secondary | ICD-10-CM | POA: Diagnosis not present

## 2019-11-14 DIAGNOSIS — E119 Type 2 diabetes mellitus without complications: Secondary | ICD-10-CM | POA: Diagnosis not present

## 2019-11-14 DIAGNOSIS — Z923 Personal history of irradiation: Secondary | ICD-10-CM | POA: Diagnosis not present

## 2019-11-14 DIAGNOSIS — Z79899 Other long term (current) drug therapy: Secondary | ICD-10-CM | POA: Diagnosis not present

## 2019-11-14 DIAGNOSIS — C7951 Secondary malignant neoplasm of bone: Secondary | ICD-10-CM | POA: Diagnosis not present

## 2019-11-14 DIAGNOSIS — E039 Hypothyroidism, unspecified: Secondary | ICD-10-CM

## 2019-11-14 LAB — CMP (CANCER CENTER ONLY)
ALT: 19 U/L (ref 0–44)
AST: 18 U/L (ref 15–41)
Albumin: 3.5 g/dL (ref 3.5–5.0)
Alkaline Phosphatase: 113 U/L (ref 38–126)
Anion gap: 11 (ref 5–15)
BUN: 43 mg/dL — ABNORMAL HIGH (ref 8–23)
CO2: 31 mmol/L (ref 22–32)
Calcium: 11.1 mg/dL — ABNORMAL HIGH (ref 8.9–10.3)
Chloride: 104 mmol/L (ref 98–111)
Creatinine: 2.46 mg/dL — ABNORMAL HIGH (ref 0.61–1.24)
GFR, Est AFR Am: 28 mL/min — ABNORMAL LOW (ref >60)
GFR, Estimated: 24 mL/min — ABNORMAL LOW (ref >60)
Glucose, Bld: 205 mg/dL — ABNORMAL HIGH (ref 70–99)
Potassium: 3.8 mmol/L (ref 3.5–5.1)
Sodium: 146 mmol/L — ABNORMAL HIGH (ref 135–145)
Total Bilirubin: 0.7 mg/dL (ref 0.3–1.2)
Total Protein: 6.7 g/dL (ref 6.5–8.1)

## 2019-11-14 LAB — CBC WITH DIFFERENTIAL (CANCER CENTER ONLY)
Abs Immature Granulocytes: 0.02 10*3/uL (ref 0.00–0.07)
Basophils Absolute: 0 10*3/uL (ref 0.0–0.1)
Basophils Relative: 1 %
Eosinophils Absolute: 0.1 10*3/uL (ref 0.0–0.5)
Eosinophils Relative: 2 %
HCT: 37.9 % — ABNORMAL LOW (ref 39.0–52.0)
Hemoglobin: 11.7 g/dL — ABNORMAL LOW (ref 13.0–17.0)
Immature Granulocytes: 0 %
Lymphocytes Relative: 18 %
Lymphs Abs: 1 10*3/uL (ref 0.7–4.0)
MCH: 28 pg (ref 26.0–34.0)
MCHC: 30.9 g/dL (ref 30.0–36.0)
MCV: 90.7 fL (ref 80.0–100.0)
Monocytes Absolute: 0.4 10*3/uL (ref 0.1–1.0)
Monocytes Relative: 8 %
Neutro Abs: 3.7 10*3/uL (ref 1.7–7.7)
Neutrophils Relative %: 71 %
Platelet Count: 250 10*3/uL (ref 150–400)
RBC: 4.18 MIL/uL — ABNORMAL LOW (ref 4.22–5.81)
RDW: 15.6 % — ABNORMAL HIGH (ref 11.5–15.5)
WBC Count: 5.2 10*3/uL (ref 4.0–10.5)
nRBC: 0 % (ref 0.0–0.2)

## 2019-11-14 LAB — LACTATE DEHYDROGENASE: LDH: 185 U/L (ref 98–192)

## 2019-11-14 NOTE — Telephone Encounter (Signed)
11/14/2019  LVM for Ms. Dorothy to return call.  Charyl Bigger, CMA

## 2019-11-14 NOTE — Progress Notes (Signed)
Hematology and Oncology Follow Up Visit  [name] [name] [id number] [birth year] 79 y.o. 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 He was hospitalized last week for dehydration. Apparently he was on diuretics along with antihypertensives that left him severely dehydrated and with a creatinine greater than 4.  Creatinine at this time is 2.46 and BUN 43. Chloride 104.  He is slowly recuperating but is still feeling weak and fatigued. He is in a wheelchair today but does ambulate some at home. Prior to his hospitalization he had fallen 3 times. Thankfully he was not seriously injured.  He is doing PT and OT which is quite helpful.  He still does not have much of an appetite and admits that he needs to better hydrate throughout the day.  He gets a little winded with activity due to the weakness.  He is still taking his Inlyta as prescribed (21/7).  No fever, chills, n/v, cough, rash, dizziness, chest pain, palpitations, abdominal pain or changes in bowel or bladder habits.  He is still taking his Flomax as prescribed.  No swelling, numbness or tingling in his extremities.  No new falls. No syncopal episodes to report.  He did no feel like standing to be weighed today.   ECOG Performance Status: 1 - Symptomatic but completely ambulatory  Medications:  Allergies as of 11/14/2019      Reactions   Doxycycline Other (See Comments)   Flushing of hands and face.       Medication List       Accurate as of November 14, 2019 12:18 PM. If you have any questions, ask your nurse or doctor.        albuterol 108 (90 Base) MCG/ACT inhaler Commonly known as: VENTOLIN HFA INHALE 2  PUFFS BY MOUTH INTO THE LUNGS EVERY 6 HOURS AS DIRECTED AS NEEDED FOR WHEEZING   amLODipine 10 MG tablet Commonly known as: NORVASC Take 1 tablet (10 mg total) by mouth daily.   Anoro Ellipta 62.5-25 MCG/INH Aepb Generic drug: umeclidinium-vilanterol Inhale 1 puff into the lungs daily.   atorvastatin 40 MG tablet Commonly known as: LIPITOR Take 40 mg by mouth daily.   calcitRIOL 0.5 MCG capsule Commonly known as: ROCALTROL Take by mouth.   calcium carbonate 500 MG chewable tablet Commonly known as: TUMS - dosed in mg elemental calcium Chew by mouth.   carvedilol 25 MG tablet Commonly known as: COREG Take 1 tablet (25 mg total) by mouth 2 (two) times daily.   clopidogrel 75 MG tablet Commonly known as: PLAVIX TAKE 1 TABLET BY MOUTH EVERY DAY   gabapentin 600 MG tablet Commonly known as: Neurontin Take 1 tablet (600 mg total) by mouth at bedtime.   glipiZIDE 5 MG 24 hr tablet Commonly known as: GLUCOTROL XL Take 5 mg by mouth daily with breakfast.   glucose blood test strip Commonly known as: Estate manager/land agent Diagnoses - Diabetes E 11.8 Check blood sugar once daily   Inlyta 5 MG tablet Generic drug: axitinib TAKE 1 TABLET ('5MG'$ ) BY MOUTH TWICE DAILY FOR 21 DAYS ON, THEN 7 DAYS OFF.   levothyroxine 125 MCG tablet Commonly known as: SYNTHROID Take 1  tablet (125 mcg total) by mouth daily before breakfast.   metFORMIN 500 MG 24 hr tablet Commonly known as: GLUCOPHAGE-XR TAKE 2 TABLETS BY MOUTH EVERY DAY WITH BREAKFAST What changed: See the new instructions.   multivitamin with minerals tablet Take 1 tablet by mouth daily.   Oxycodone HCl 10 MG Tabs Take 1 tablet (10 mg total) by mouth every 6 (six) hours as needed.   prochlorperazine 10 MG tablet Commonly known as: COMPAZINE Take 10 mg by mouth every 6 (six) hours as needed for nausea or vomiting.   tamsulosin 0.4 MG Caps capsule Commonly known as: Flomax Take 1 capsule (0.4 mg total) by mouth 2 (two)  times daily.   Trelegy Ellipta 100-62.5-25 MCG/INH Aepb Generic drug: Fluticasone-Umeclidin-Vilant Inhale 1 puff into the lungs daily.   vitamin B-6 25 MG tablet Commonly known as: pyridOXINE Take 1 tablet (25 mg total) by mouth daily. Muscle Cramping.   Xtampza ER 13.5 MG C12a Generic drug: oxyCODONE ER Take 1 capsule by mouth every 12 (twelve) hours.       Allergies:  Allergies  Allergen Reactions  . Doxycycline Other (See Comments)    Flushing of hands and face.     Past Medical History, Surgical history, Social history, and Family History were reviewed and updated.  Review of Systems: All other 10 point review of systems is negative.   Physical Exam:  vitals were not taken for this visit.   Wt Readings from Last 3 Encounters:  10/02/19 166 lb (75.3 kg)  09/12/19 159 lb (72.1 kg)  09/11/19 156 lb (70.8 kg)    Ocular: Sclerae unicteric, pupils equal, round and reactive to light Ear-nose-throat: Oropharynx clear, dentition fair Lymphatic: No cervical or supraclavicular adenopathy Lungs no rales or rhonchi, good excursion bilaterally Heart regular rate and rhythm, no murmur appreciated Abd soft, nontender, positive bowel sounds, no liver or spleen tip palpated on exam, no fluid wave  MSK no focal spinal tenderness, no joint edema Neuro: non-focal, well-oriented, appropriate affect Breasts: Deferred   Lab Results  Component Value Date   WBC 5.6 10/02/2019   HGB 11.8 (L) 10/02/2019   HCT 37.6 (L) 10/02/2019   MCV 91.5 10/02/2019   PLT 181 10/02/2019   Lab Results  Component Value Date   FERRITIN 95 10/01/2017   Lab Results  Component Value Date   RBC 4.11 (L) 10/02/2019   Lab Results  Component Value Date   KPAFRELGTCHN 28.2 (H) 05/05/2018   LAMBDASER 24.6 05/05/2018   KAPLAMBRATIO 5.75 05/09/2018   Lab Results  Component Value Date   IGGSERUM 799 05/05/2018   IGA 229 05/05/2018   IGMSERUM 136 05/05/2018   Lab Results  Component Value Date    TOTALPROTELP 6.6 05/05/2018   ALBUMINELP 3.4 05/05/2018   A1GS 0.3 05/05/2018   A2GS 1.1 (H) 05/05/2018   BETS 0.9 05/05/2018   BETA2SER 0.4 04/29/2018   GAMS 0.8 05/05/2018   MSPIKE Not Observed 05/05/2018   SPEI  04/29/2018     Comment:     . Evaluation is consistent with an acute inflammatory  pattern. . . A faint abnormal protein band is detected in the gamma globulins that most likely represents CRP, or circulating immune complexes, rather than a monoclonal immunoglobulin. . . Immunofixation analysis is available if identification of the band(s) is clinically indicated. .      Chemistry      Component Value Date/Time   NA 144 10/02/2019 1034   NA 140 11/25/2018 1055   K  4.5 10/02/2019 1034   CL 108 10/02/2019 1034   CO2 25 10/02/2019 1034   BUN 36 (H) 10/02/2019 1034   BUN 20 11/25/2018 1055   CREATININE 1.63 (H) 10/02/2019 1034   CREATININE 1.17 07/06/2019 1105      Component Value Date/Time   CALCIUM 9.2 10/02/2019 1034   ALKPHOS 79 10/02/2019 1034   AST 18 10/02/2019 1034   ALT 20 10/02/2019 1034   BILITOT 0.5 10/02/2019 1034       Impression and Plan: [name] [name] is a very pleasant 79 yo caucasian gentleman with metastatic kidney cancer. He also has history of prostate cancer and melanoma in situ.  As mentioned above he has had a rough week or so due to the medication induced dehydration. I spoke with Dr. Marin Olp and we will have him continue his Bartholomew Boards but hold Keytruda today.  We will give him a break and plan to see him back in 3 weeks.  He will get CT scans, lab work and see the MD that day and resume treatment if appropriate at that time.  They will contact our office with any questions or concerns. We can certainly see him sooner if needed.   Laverna Peace, NP 2/23/202112:18 PM

## 2019-11-14 NOTE — Telephone Encounter (Signed)
11/14/2019  Attempted to contact pt, however line was busy.  Charyl Bigger, CMA

## 2019-11-14 NOTE — Telephone Encounter (Signed)
K for skilled nursing for medications.

## 2019-11-14 NOTE — Telephone Encounter (Signed)
[name] [name] with [facility] called for verbal orders for skilled nursing for medication management.

## 2019-11-14 NOTE — Telephone Encounter (Signed)
11/14/2019  Attempted to contact pt.  However, individual answering the phone said that the patient had just left for a doctor's appointment.  LM for pt to return call.  Charyl Bigger, CMA

## 2019-11-15 ENCOUNTER — Telehealth: Payer: Self-pay

## 2019-11-15 ENCOUNTER — Telehealth: Payer: Self-pay | Admitting: *Deleted

## 2019-11-15 DIAGNOSIS — I129 Hypertensive chronic kidney disease with stage 1 through stage 4 chronic kidney disease, or unspecified chronic kidney disease: Secondary | ICD-10-CM | POA: Diagnosis not present

## 2019-11-15 DIAGNOSIS — I2510 Atherosclerotic heart disease of native coronary artery without angina pectoris: Secondary | ICD-10-CM | POA: Diagnosis not present

## 2019-11-15 DIAGNOSIS — E039 Hypothyroidism, unspecified: Secondary | ICD-10-CM | POA: Diagnosis not present

## 2019-11-15 DIAGNOSIS — J449 Chronic obstructive pulmonary disease, unspecified: Secondary | ICD-10-CM | POA: Diagnosis not present

## 2019-11-15 DIAGNOSIS — M7551 Bursitis of right shoulder: Secondary | ICD-10-CM | POA: Diagnosis not present

## 2019-11-15 DIAGNOSIS — M1990 Unspecified osteoarthritis, unspecified site: Secondary | ICD-10-CM | POA: Diagnosis not present

## 2019-11-15 DIAGNOSIS — N189 Chronic kidney disease, unspecified: Secondary | ICD-10-CM | POA: Diagnosis not present

## 2019-11-15 DIAGNOSIS — G8929 Other chronic pain: Secondary | ICD-10-CM | POA: Diagnosis not present

## 2019-11-15 DIAGNOSIS — D631 Anemia in chronic kidney disease: Secondary | ICD-10-CM | POA: Diagnosis not present

## 2019-11-15 DIAGNOSIS — E1122 Type 2 diabetes mellitus with diabetic chronic kidney disease: Secondary | ICD-10-CM | POA: Diagnosis not present

## 2019-11-15 DIAGNOSIS — E872 Acidosis: Secondary | ICD-10-CM | POA: Diagnosis not present

## 2019-11-15 DIAGNOSIS — M48061 Spinal stenosis, lumbar region without neurogenic claudication: Secondary | ICD-10-CM | POA: Diagnosis not present

## 2019-11-15 DIAGNOSIS — N179 Acute kidney failure, unspecified: Secondary | ICD-10-CM | POA: Diagnosis not present

## 2019-11-15 LAB — TSH: TSH: 23.964 u[IU]/mL — ABNORMAL HIGH (ref 0.320–4.118)

## 2019-11-15 MED ORDER — HYDRALAZINE HCL 10 MG PO TABS: 10.0000 mg | ORAL_TABLET | Freq: Three times a day (TID) | ORAL | 1 refills | Status: AC

## 2019-11-15 NOTE — Telephone Encounter (Signed)
Spoke to pt's son Rolena Infante, informed of provider's note / recommendation. Rolena Infante (son) agreeable with provide's plan. Son stated he has a home blood pressure cuff and will check pt's blood pressure. Pt has a home visit with Physical therapist this afternoon and a Occupational therapist on Thursday. Blood pressure will be checked during both home visits. He will check pt's blood pressure on Friday. Pt's son aware to call clinic with readings of blood pressure for pt.

## 2019-11-15 NOTE — Telephone Encounter (Signed)
Left message stating it is ok for skilled nursing for medication management.

## 2019-11-15 NOTE — Telephone Encounter (Signed)
-----   Message from [name], MD sent at 11/15/2019 11:44 AM EST ----- Call - is he taking his Synthroid??  The TSH is still very high!!!  Need to increase dose to 150 mcg po q daily. [name] [name]

## 2019-11-15 NOTE — Telephone Encounter (Signed)
OK, it was high when he was here as well. Really needs to make sure eating a low salt diet and will add a new BP pill call hydralazine that is OK to take with his current kidney impairment.  Unfortunately it is TID.  New rx sent to pharmacy. Let us know if BP better in a few days.

## 2019-11-15 NOTE — Telephone Encounter (Signed)
As noted below by Dr. Marin Olp, I informed the son of his lab results. Son stated,"he is taking his Synthroid in the morning with food. We met with his PCP and he thinks since dad is taking it with breakfast, it is not being absorbed. Son would like to wait before Dr. Marin Olp increases the dose, and recheck the TSH to see if it has decreased." I told the son I would give this message to Dr. Marin Olp. He verbalized understanding.

## 2019-11-15 NOTE — Telephone Encounter (Signed)
Physical therapist Jacqlyn Larsen called stating she had a home visit with pt today. Blood pressure reading during physical therapy was (RA) 200/70 and (LA) 184/72, heart rate 48. She stated that pt has no complaints or symptoms. Pt has confirmed to keep home evaluation for occupational therapy on Thursday.

## 2019-11-16 DIAGNOSIS — N179 Acute kidney failure, unspecified: Secondary | ICD-10-CM | POA: Diagnosis not present

## 2019-11-16 DIAGNOSIS — G8929 Other chronic pain: Secondary | ICD-10-CM | POA: Diagnosis not present

## 2019-11-16 DIAGNOSIS — M48061 Spinal stenosis, lumbar region without neurogenic claudication: Secondary | ICD-10-CM | POA: Diagnosis not present

## 2019-11-16 DIAGNOSIS — E039 Hypothyroidism, unspecified: Secondary | ICD-10-CM | POA: Diagnosis not present

## 2019-11-16 DIAGNOSIS — I2510 Atherosclerotic heart disease of native coronary artery without angina pectoris: Secondary | ICD-10-CM | POA: Diagnosis not present

## 2019-11-16 DIAGNOSIS — E872 Acidosis: Secondary | ICD-10-CM | POA: Diagnosis not present

## 2019-11-16 DIAGNOSIS — M7551 Bursitis of right shoulder: Secondary | ICD-10-CM | POA: Diagnosis not present

## 2019-11-16 DIAGNOSIS — M1990 Unspecified osteoarthritis, unspecified site: Secondary | ICD-10-CM | POA: Diagnosis not present

## 2019-11-16 DIAGNOSIS — J449 Chronic obstructive pulmonary disease, unspecified: Secondary | ICD-10-CM | POA: Diagnosis not present

## 2019-11-16 DIAGNOSIS — I129 Hypertensive chronic kidney disease with stage 1 through stage 4 chronic kidney disease, or unspecified chronic kidney disease: Secondary | ICD-10-CM | POA: Diagnosis not present

## 2019-11-16 DIAGNOSIS — N189 Chronic kidney disease, unspecified: Secondary | ICD-10-CM | POA: Diagnosis not present

## 2019-11-16 DIAGNOSIS — D631 Anemia in chronic kidney disease: Secondary | ICD-10-CM | POA: Diagnosis not present

## 2019-11-16 DIAGNOSIS — E1122 Type 2 diabetes mellitus with diabetic chronic kidney disease: Secondary | ICD-10-CM | POA: Diagnosis not present

## 2019-11-16 NOTE — Telephone Encounter (Signed)
Spoke to pt directly regarding provider's note / recommendation. Verbalized understanding, agreeable with provider's plan. Aware that provider has sent in a new bp rx to be taken TID. Pt will continue with blood pressure checks and will call the clinic with updates of bp readings. Pt will have his son Rolena Infante) contact the clinic if he has any issues with the new medication.

## 2019-11-17 ENCOUNTER — Telehealth: Payer: Self-pay | Admitting: Family Medicine

## 2019-11-17 DIAGNOSIS — Z515 Encounter for palliative care: Secondary | ICD-10-CM

## 2019-11-17 DIAGNOSIS — C641 Malignant neoplasm of right kidney, except renal pelvis: Secondary | ICD-10-CM

## 2019-11-17 NOTE — Telephone Encounter (Signed)
Son calls with dads last 3 days of blood pressure reading: Wednesday: 200/70  P=38  O2=96 Thursday: 159/60  P=48  O2= 99 Friday: 135/67  P= 49

## 2019-11-17 NOTE — Telephone Encounter (Signed)
Pressure today looks absolutely fantastic so lets continue with this regimen and just monitor through the weekend.  If at any point the top number on the blood pressure goes below 120 then okay to stop the hydralazine if needed.

## 2019-11-17 NOTE — Telephone Encounter (Signed)
Called and [facility] for [place] about the MS change.  Would like to schedule appt if bpoosible. Could be med side effect vs infection vs stroke, etc. Told to go to ED if sxs worsen or don't improve.

## 2019-11-17 NOTE — Telephone Encounter (Signed)
Called and spoke with son, [name] [name] to give him information from previous phone note.  He wanted you to know that he has seen significant changes in his ability to communicate the last two days.  He has not been alert, sleeping more and can't carry on an adult conversation. Does not see any signs of a stroke or anything but it's concerning him that the change has been drastic the last two days.  I suggested he bring him in for an appointment.  What are your thoughts?

## 2019-11-20 NOTE — Telephone Encounter (Signed)
Per son he is doing very poorly. He is sleeping a lot. He is in pain if they even try to move him.  He hasn't had a BM in 3-4 days.  His is urinating. He is now choking when he tries to swallow. They don't want any extreme measures and are requesting a hospice consult STAT.  Order placed today.  They don't want to take him to the ED.

## 2019-11-20 NOTE — Telephone Encounter (Signed)
[name] [name] states his dad is having increased confusion. He slept all day the next day. The last two day have been worse. He wanted to know if hospice could do an assessment.

## 2019-11-20 NOTE — Addendum Note (Signed)
Addended by: Beatrice Lecher D on: 11/20/2019 04:17 PM   Modules accepted: Orders

## 2019-11-21 NOTE — Telephone Encounter (Signed)
[name] [name] with [facility] called and states they did place him into hospice care.   [phone]

## 2019-11-22 ENCOUNTER — Telehealth: Payer: Self-pay | Admitting: Hematology & Oncology

## 2019-11-22 NOTE — Telephone Encounter (Signed)
Appointments were rescheduled per Dr Marin Olp.  Updated schedule and letter was mailed

## 2019-11-27 ENCOUNTER — Inpatient Hospital Stay: Payer: Medicare Other

## 2019-11-27 ENCOUNTER — Inpatient Hospital Stay: Payer: Medicare Other | Admitting: Hematology & Oncology

## 2019-12-04 ENCOUNTER — Ambulatory Visit: Payer: Medicare Other | Admitting: Hematology & Oncology

## 2019-12-04 ENCOUNTER — Other Ambulatory Visit (HOSPITAL_BASED_OUTPATIENT_CLINIC_OR_DEPARTMENT_OTHER): Payer: Medicare Other

## 2019-12-04 ENCOUNTER — Ambulatory Visit: Payer: Medicare Other

## 2019-12-04 ENCOUNTER — Other Ambulatory Visit: Payer: Medicare Other

## 2019-12-21 ENCOUNTER — Ambulatory Visit: Payer: Medicare Other | Admitting: Family Medicine

## 2019-12-21 DEATH — deceased

## 2020-07-07 IMAGING — US US EXTREM LOW VENOUS
2 series · 13 of 24 positions shown · non-contrast
Comparison: None.

CLINICAL DATA: Bilateral extremity swelling for the past 2-3 weeks.
History of renal cell carcinoma. Elevated D-dimer. Evaluate for DVT.



[Series 1: us extrem low venous · 0.08mm/px · 12 of 96 slices shown (1 of 2)]
[im 1/96]
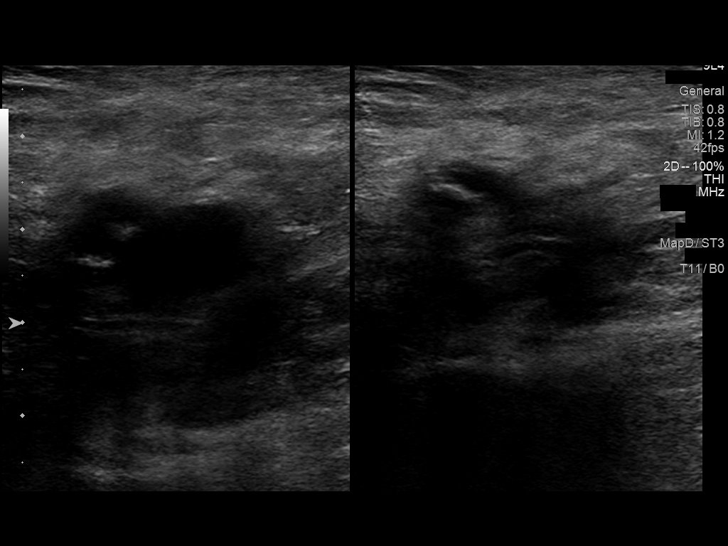
[im 9/96]
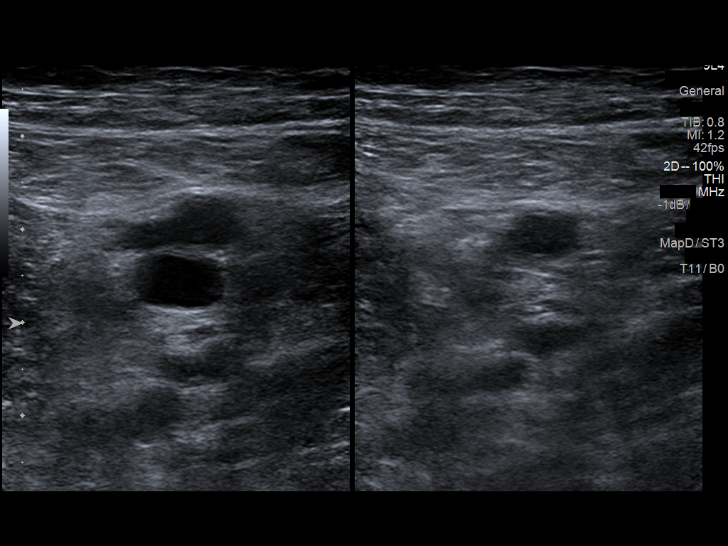
[im 18/96]
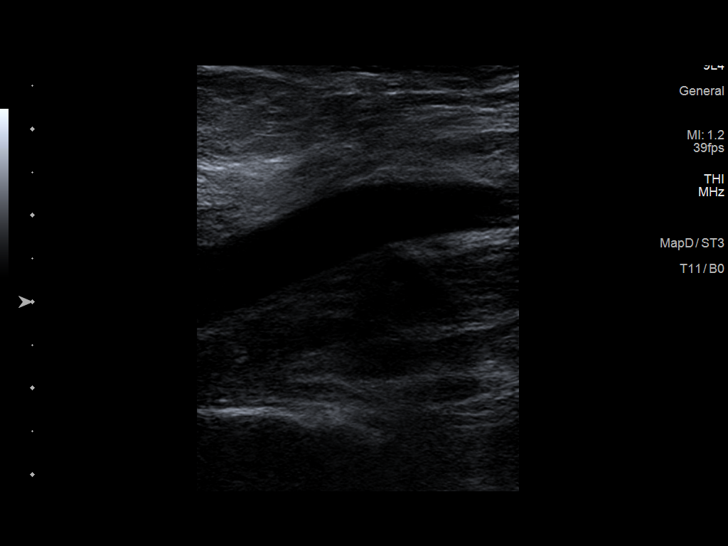
[im 26/96]
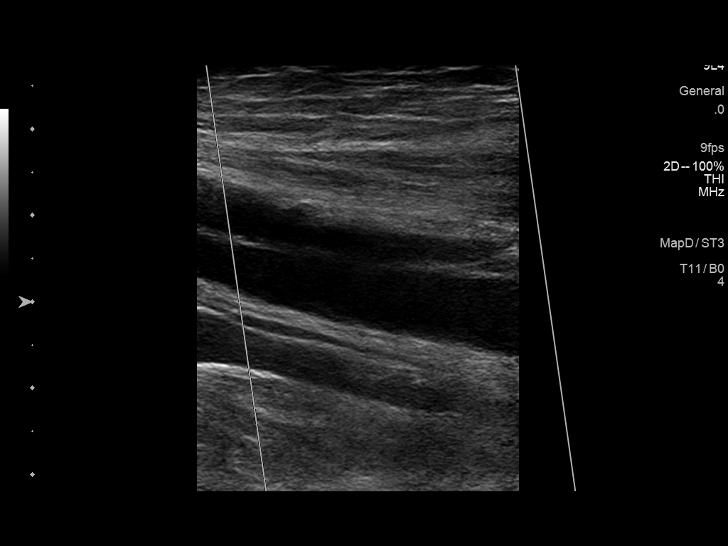
[im 35/96]
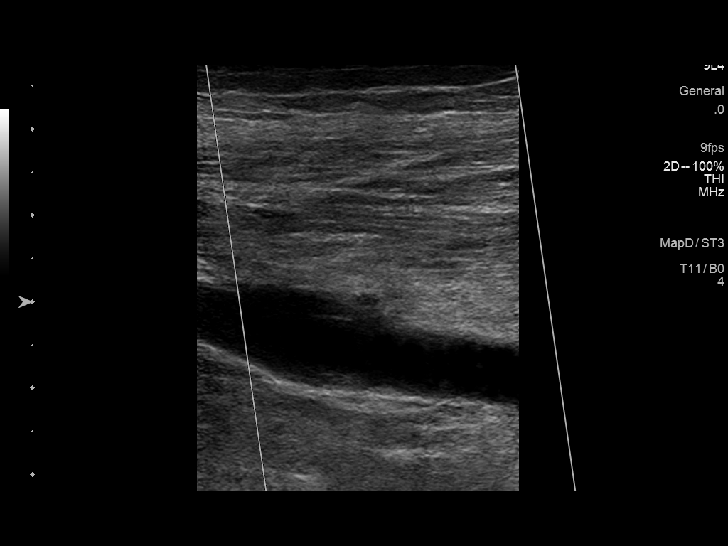
[im 44/96]
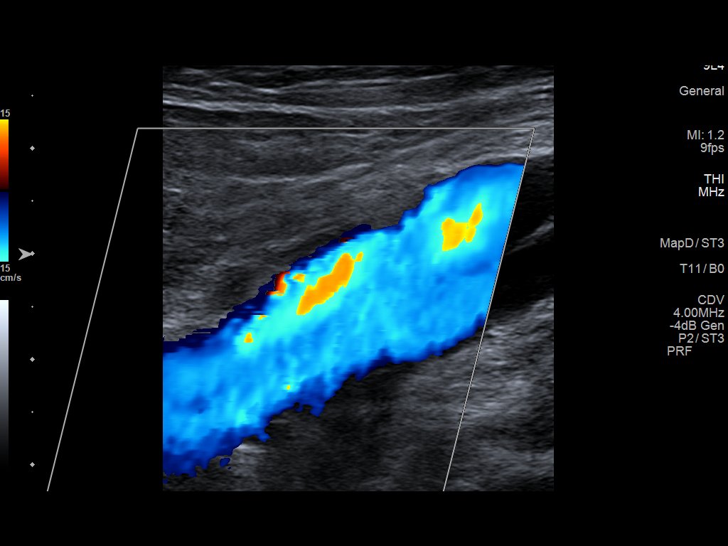
[im 52/96]
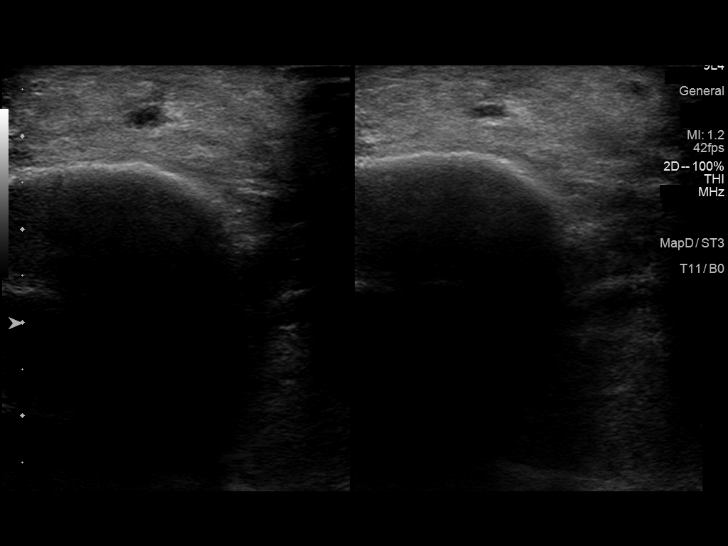
[im 57/96]
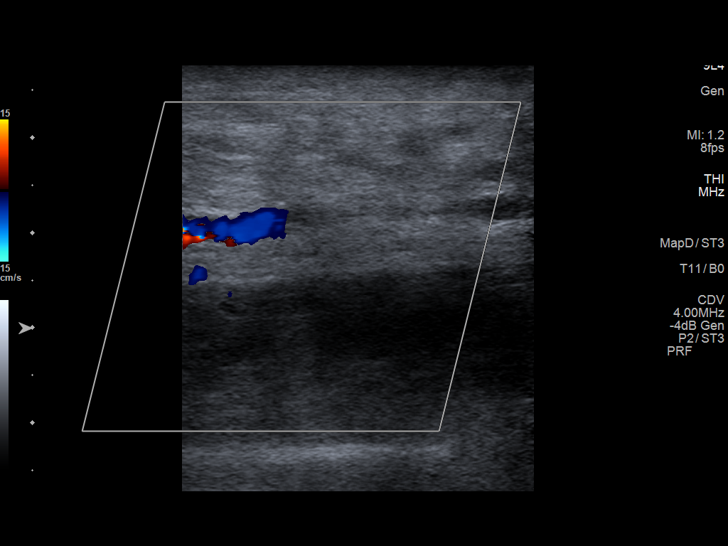
[im 65/96]
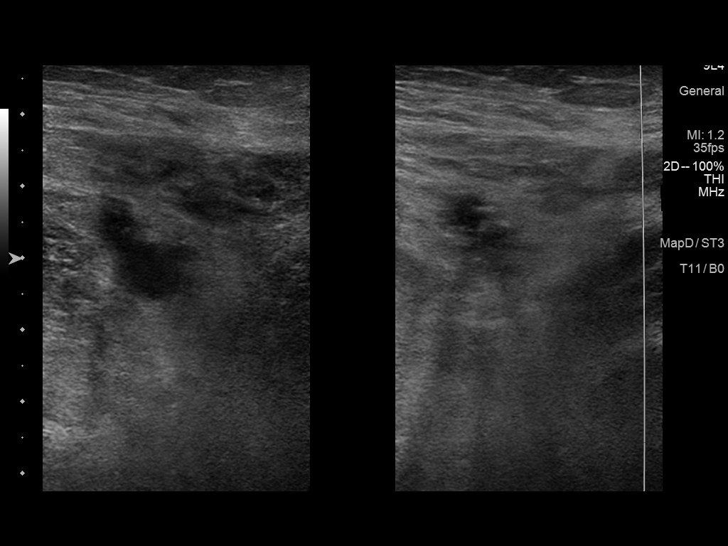
[im 74/96]
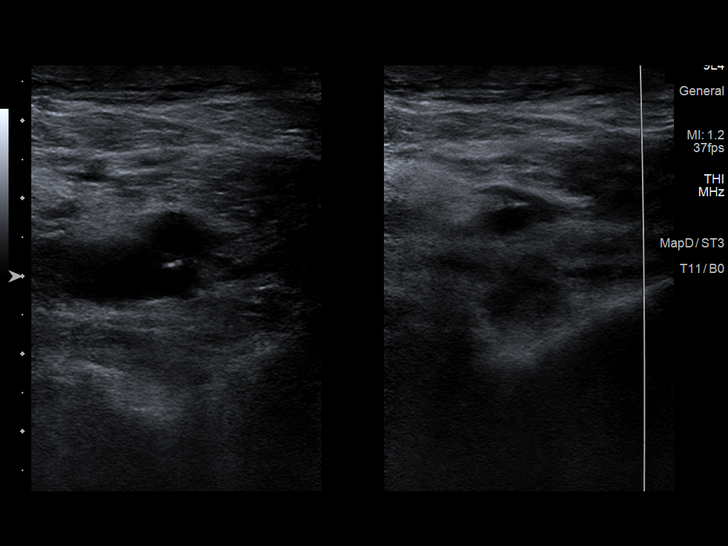
[im 83/96]
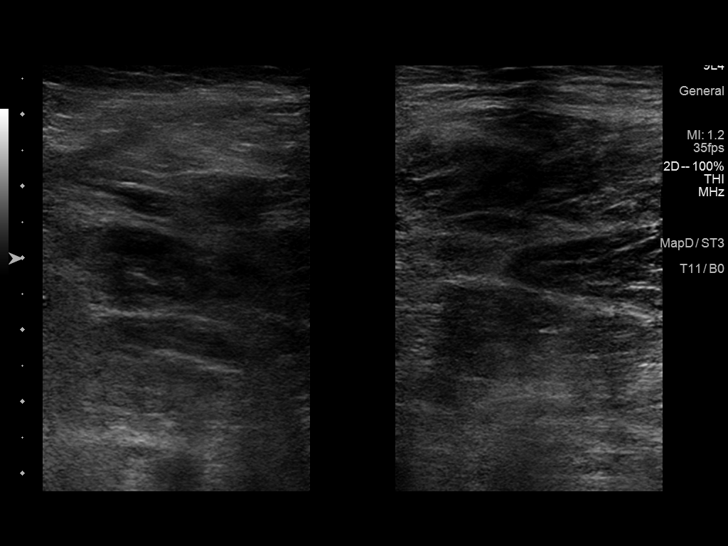
[im 91/96]
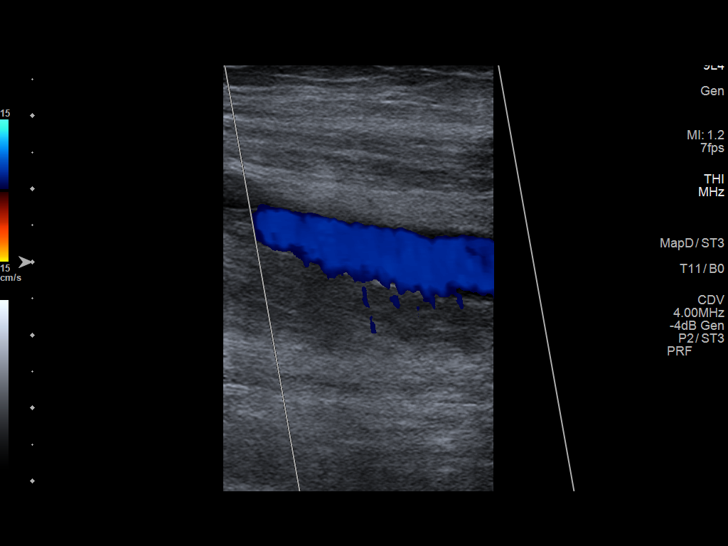

[Series 2: us extrem low venous · 0.09mm/px · 1 of 4 slices shown (2 of 2)]
[im 1/4]
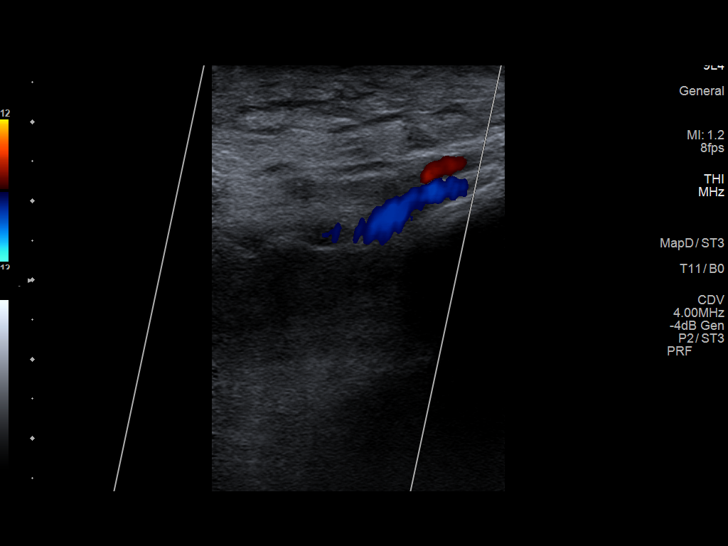

[13 of 24 positions shown; findings below may reference images not displayed]

FINDINGS: RIGHT LOWER EXTREMITY

Common Femoral Vein: No evidence of thrombus. Normal
compressibility, respiratory phasicity and response to augmentation.

Saphenofemoral Junction: No evidence of thrombus. Normal
compressibility and flow on color Doppler imaging.

Profunda Femoral Vein: No evidence of thrombus. Normal
compressibility and flow on color Doppler imaging.

Femoral Vein: No evidence of thrombus. Normal compressibility,
respiratory phasicity and response to augmentation.

Popliteal Vein: No evidence of thrombus. Normal compressibility,
respiratory phasicity and response to augmentation.

Calf Veins: No evidence of thrombus. Normal compressibility and flow
on color Doppler imaging.

Superficial Great Saphenous Vein: No evidence of thrombus. Normal
compressibility.

Venous Reflux:  None.

Other Findings:  None.

LEFT LOWER EXTREMITY

Common Femoral Vein: No evidence of thrombus. Normal
compressibility, respiratory phasicity and response to augmentation.

Saphenofemoral Junction: No evidence of thrombus. Normal
compressibility and flow on color Doppler imaging.

Profunda Femoral Vein: No evidence of thrombus. Normal
compressibility and flow on color Doppler imaging.

Femoral Vein: No evidence of thrombus. Normal compressibility,
respiratory phasicity and response to augmentation.

Popliteal Vein: No evidence of thrombus. Normal compressibility,
respiratory phasicity and response to augmentation.

Calf Veins: No evidence of thrombus. Normal compressibility and flow
on color Doppler imaging.

Superficial Great Saphenous Vein: No evidence of thrombus. Normal
compressibility.

Venous Reflux:  None.

Other Findings:  None.
IMPRESSION: No evidence of DVT within either lower extremity.

## 2020-09-12 IMAGING — MR MR LUMBAR SPINE W/O CM
4 of 5 series · 27 of 48 positions shown · non-contrast
Comparison: None.

CLINICAL DATA: Left lower extremity radicular symptoms. History of
spinal stenosis.

EXAM:
MRI LUMBAR SPINE WITHOUT CONTRAST
TECHNIQUE: Multiplanar, multisequence MR imaging of the lumbar spine was
performed. No intravenous contrast was administered.

[Series 4: T2 · sagittal · 4.0mm · 0.81mm/px · 6 of 15 slices shown (1 of 2)]
[im 1/15]
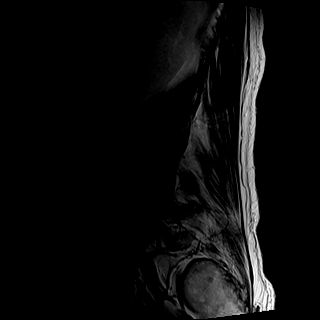
[im 3/15]
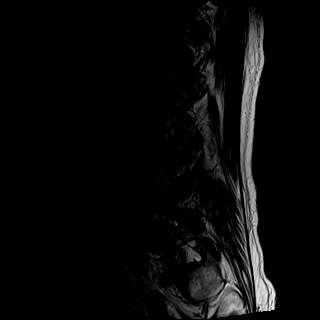
[im 6/15]
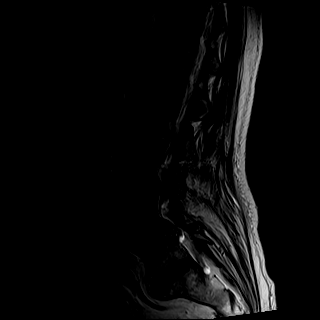
[im 9/15]
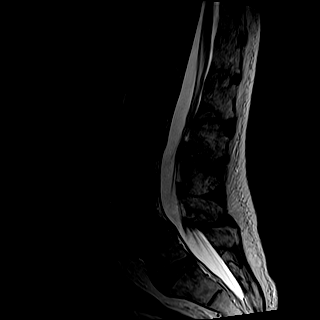
[im 12/15]
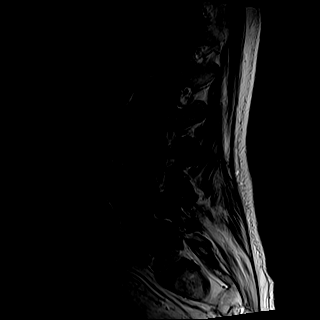
[im 15/15]
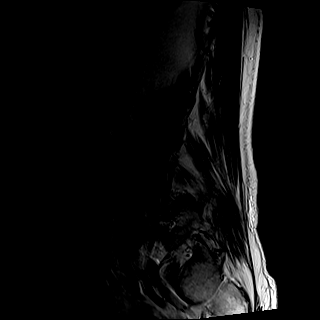

[Series 5: T1 · sagittal · 4.0mm · 0.41mm/px · 5 of 15 slices shown (1 of 2)]
[im 1/15]
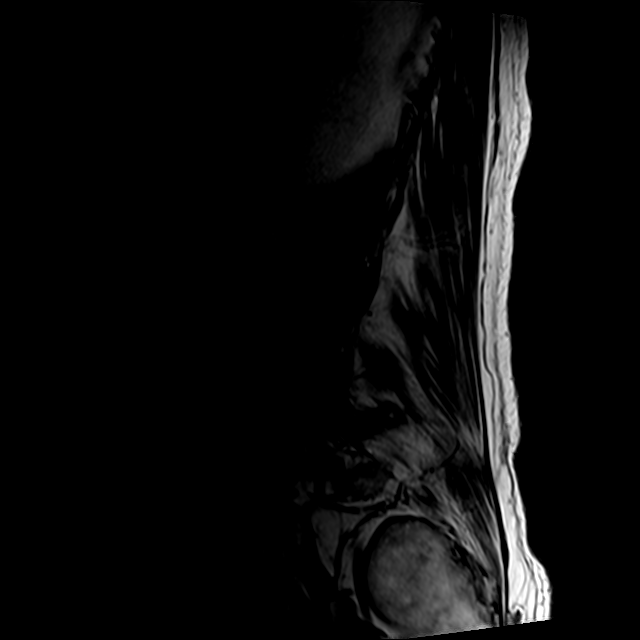
[im 4/15]
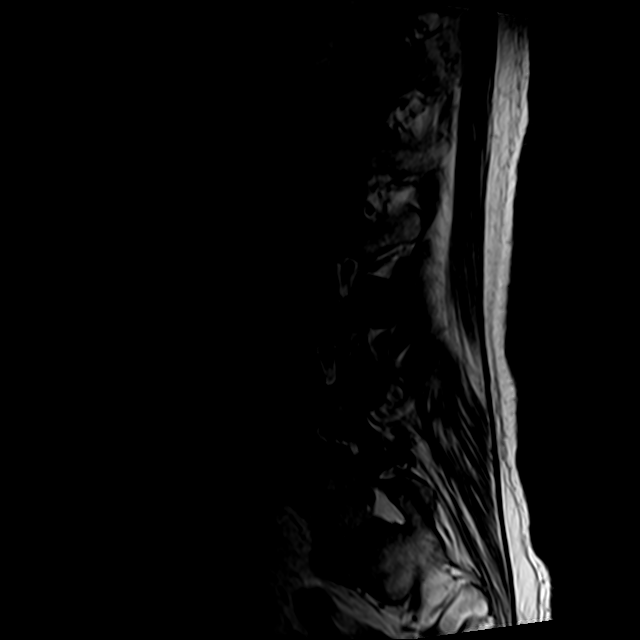
[im 8/15]
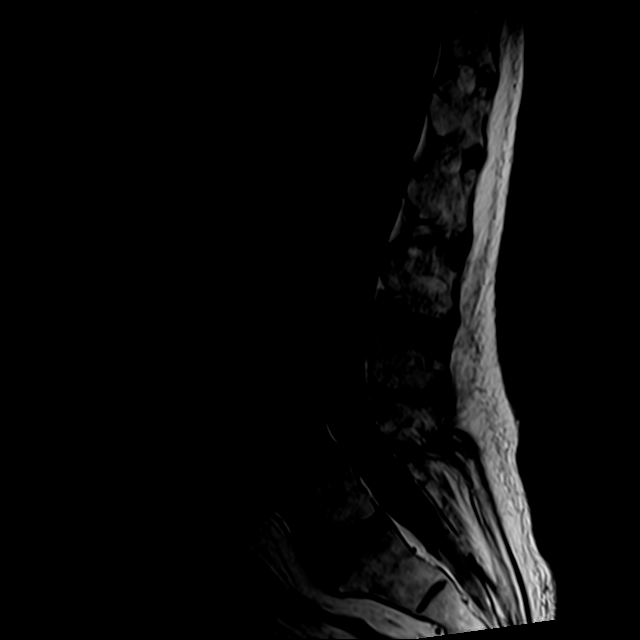
[im 11/15]
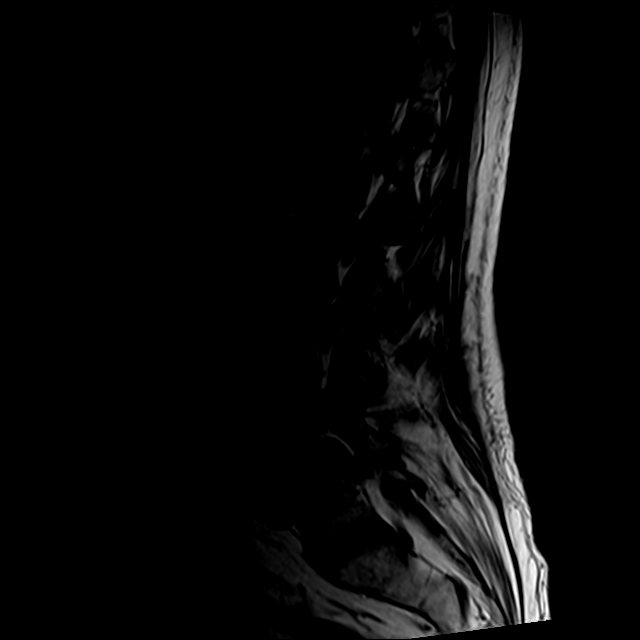
[im 15/15]
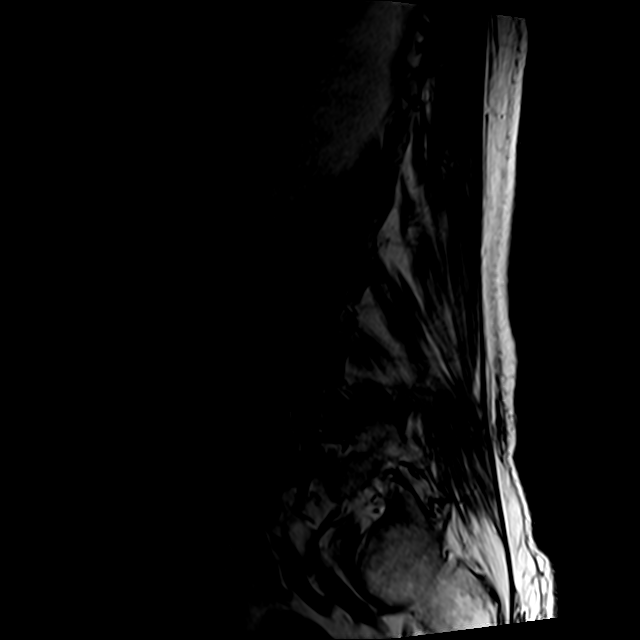

[Series 9: T2 · axial · 4.0mm · 0.78mm/px · z∈[-38,+215]mm · 10 of 45 slices shown (2 of 2)]
[im 3/45]
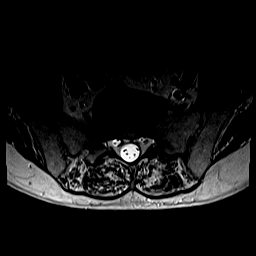
[im 6/45]
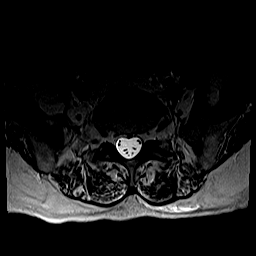
[im 9/45]
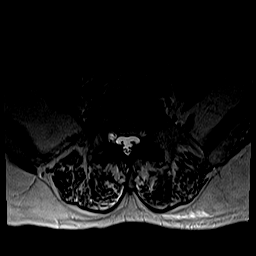
[im 15/45]
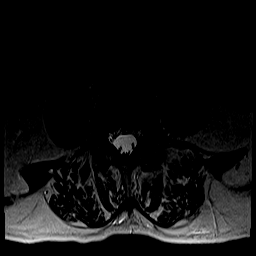
[im 21/45]
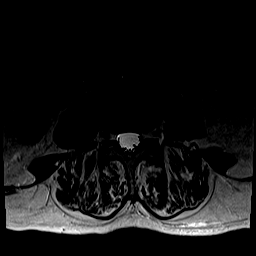
[im 24/45]
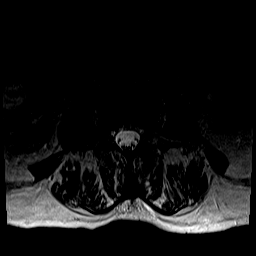
[im 27/45]
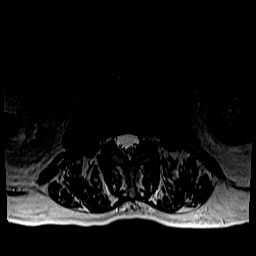
[im 33/45]
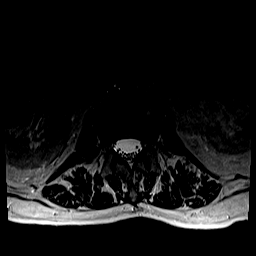
[im 39/45]
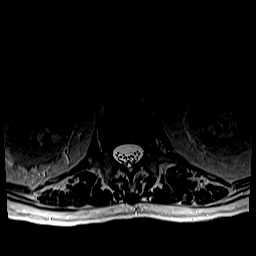
[im 45/45]
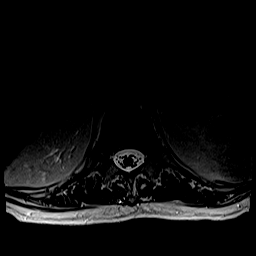

[Series 12: T1 · axial · 4.0mm · 0.39mm/px · z∈[-38,+185]mm · 6 of 45 slices shown (2 of 2)]
[im 3/45]
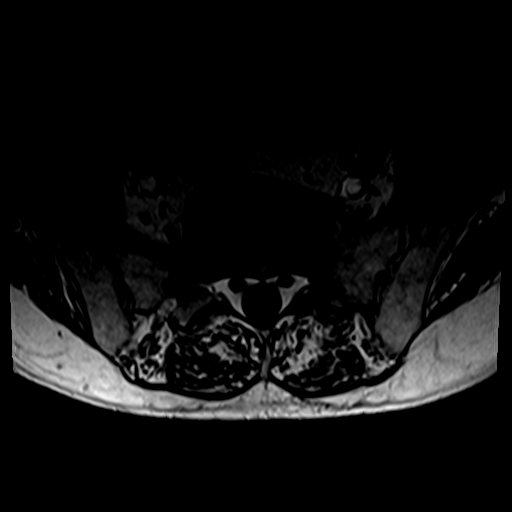
[im 6/45]
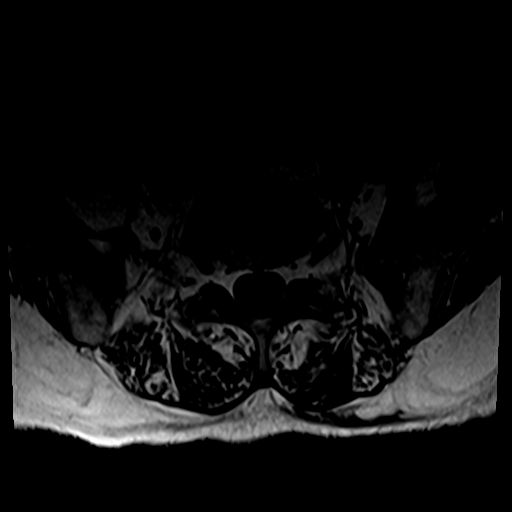
[im 9/45]
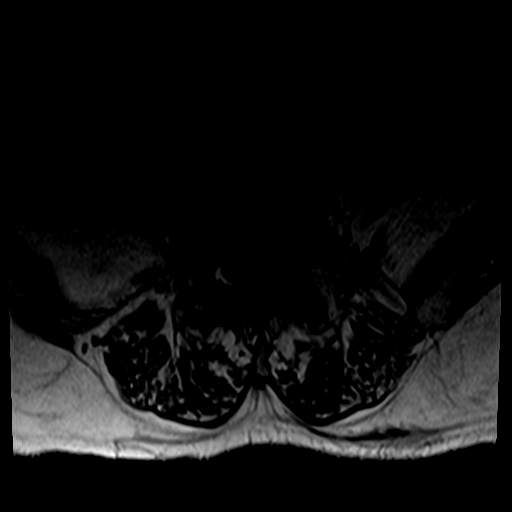
[im 15/45]
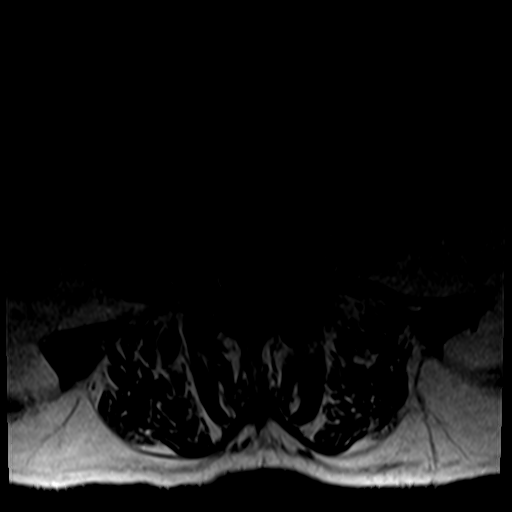
[im 24/45]
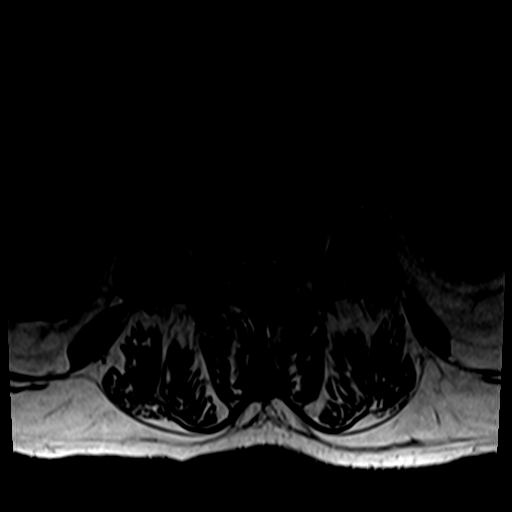
[im 39/45]
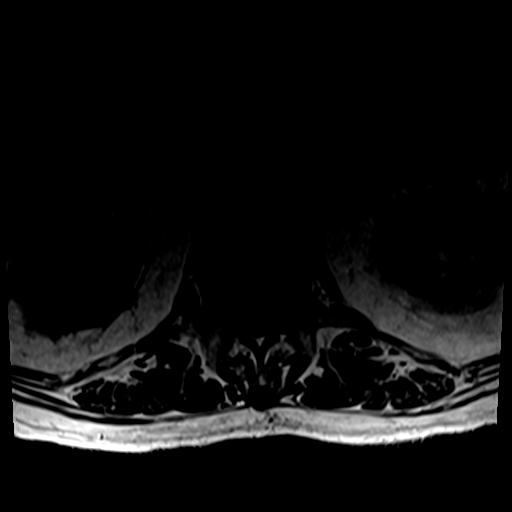

[27 of 48 positions shown; findings below may reference images not displayed]

FINDINGS: Segmentation: There are five lumbar type vertebral bodies. The last
full intervertebral disc space is labeled L5-S1.

Alignment: Degenerative anterolisthesis of L4. The other vertebral
bodies are normally aligned.

Vertebrae:  Normal marrow signal.  No bone lesions or fractures.

Conus medullaris and cauda equina: Conus extends to the T12-L1
level. Conus and cauda equina appear normal.

Paraspinal and other soft tissues: No significant findings. Simple
appearing renal cysts. Stable small focal aneurysm just above the
iliac artery bifurcation measuring 2.2 cm.

Disc levels:

T12-L1: No significant findings.

L1-2: No significant findings.

L2-3: Mild disc desiccation and slight bulging annulus but no
spinal, lateral recess or foraminal stenosis. Mild facet disease.

L3-4: Mild facet disease but no disc protrusions, spinal or
foraminal stenosis.

L4-5: Bulging uncovered disc, osteophytic ridging, short pedicles
and severe facet disease contributing to mild to moderate spinal
stenosis and moderate to moderately severe bilateral lateral recess
stenosis. Mild foraminal encroachment bilaterally but no significant
stenosis.

L5-S1: Moderate facet disease but no disc protrusions, spinal or
foraminal stenosis.
IMPRESSION: Moderate multifactorial spinal and moderately severe bilateral
lateral recess stenosis at L4-5. Mild foraminal encroachment
bilaterally also without significant stenosis.
